# Patient Record
Sex: Female | Born: 1935 | Race: White | Hispanic: No | Marital: Married | Smoking: Former smoker
Health system: Southern US, Community
[De-identification: ages and names within clinical notes are randomized; demographics above are authoritative.]

## PROBLEM LIST (undated history)

## (undated) DIAGNOSIS — K219 Gastro-esophageal reflux disease without esophagitis: Secondary | ICD-10-CM

## (undated) DIAGNOSIS — M545 Low back pain, unspecified: Secondary | ICD-10-CM

## (undated) DIAGNOSIS — R112 Nausea with vomiting, unspecified: Secondary | ICD-10-CM

## (undated) DIAGNOSIS — M1990 Unspecified osteoarthritis, unspecified site: Secondary | ICD-10-CM

## (undated) DIAGNOSIS — I509 Heart failure, unspecified: Secondary | ICD-10-CM

## (undated) DIAGNOSIS — Z9889 Other specified postprocedural states: Secondary | ICD-10-CM

## (undated) DIAGNOSIS — I951 Orthostatic hypotension: Secondary | ICD-10-CM

## (undated) DIAGNOSIS — C50919 Malignant neoplasm of unspecified site of unspecified female breast: Secondary | ICD-10-CM

## (undated) DIAGNOSIS — I480 Paroxysmal atrial fibrillation: Secondary | ICD-10-CM

## (undated) DIAGNOSIS — I447 Left bundle-branch block, unspecified: Secondary | ICD-10-CM

## (undated) DIAGNOSIS — I472 Ventricular tachycardia: Secondary | ICD-10-CM

## (undated) DIAGNOSIS — I471 Supraventricular tachycardia: Secondary | ICD-10-CM

## (undated) DIAGNOSIS — I4719 Other supraventricular tachycardia: Secondary | ICD-10-CM

## (undated) DIAGNOSIS — R7302 Impaired glucose tolerance (oral): Secondary | ICD-10-CM

## (undated) DIAGNOSIS — G8929 Other chronic pain: Secondary | ICD-10-CM

## (undated) DIAGNOSIS — I10 Essential (primary) hypertension: Secondary | ICD-10-CM

## (undated) DIAGNOSIS — R55 Syncope and collapse: Secondary | ICD-10-CM

## (undated) DIAGNOSIS — I495 Sick sinus syndrome: Secondary | ICD-10-CM

## (undated) DIAGNOSIS — E119 Type 2 diabetes mellitus without complications: Secondary | ICD-10-CM

## (undated) DIAGNOSIS — S4991XA Unspecified injury of right shoulder and upper arm, initial encounter: Secondary | ICD-10-CM

## (undated) DIAGNOSIS — R000 Tachycardia, unspecified: Secondary | ICD-10-CM

## (undated) DIAGNOSIS — F419 Anxiety disorder, unspecified: Secondary | ICD-10-CM

## (undated) DIAGNOSIS — Z950 Presence of cardiac pacemaker: Secondary | ICD-10-CM

## (undated) DIAGNOSIS — N189 Chronic kidney disease, unspecified: Secondary | ICD-10-CM

## (undated) HISTORY — PX: REPLACEMENT TOTAL KNEE BILATERAL: SUR1225

## (undated) HISTORY — PX: TOTAL SHOULDER REPLACEMENT: SUR1217

## (undated) HISTORY — PX: ABDOMINAL HYSTERECTOMY: SHX81

## (undated) HISTORY — DX: Sick sinus syndrome: I49.5

## (undated) HISTORY — PX: INGUINAL HERNIA REPAIR: SUR1180

## (undated) HISTORY — PX: JOINT REPLACEMENT: SHX530

## (undated) HISTORY — DX: Malignant neoplasm of unspecified site of unspecified female breast: C50.919

## (undated) HISTORY — DX: Paroxysmal atrial fibrillation: I48.0

## (undated) HISTORY — DX: Left bundle-branch block, unspecified: I44.7

## (undated) HISTORY — DX: Tachycardia, unspecified: R00.0

## (undated) HISTORY — DX: Impaired glucose tolerance (oral): R73.02

## (undated) HISTORY — DX: Ventricular tachycardia: I47.2

## (undated) HISTORY — DX: Unspecified osteoarthritis, unspecified site: M19.90

## (undated) HISTORY — PX: PACEMAKER INSERTION: SHX728

## (undated) HISTORY — DX: Anxiety disorder, unspecified: F41.9

## (undated) HISTORY — PX: BREAST BIOPSY: SHX20

## (undated) HISTORY — DX: Syncope and collapse: R55

## (undated) HISTORY — PX: TONSILLECTOMY: SUR1361

---

## 1998-08-09 ENCOUNTER — Ambulatory Visit (HOSPITAL_COMMUNITY): Admission: RE | Admit: 1998-08-09 | Discharge: 1998-08-09 | Payer: Self-pay | Admitting: Internal Medicine

## 1998-08-09 ENCOUNTER — Encounter: Payer: Self-pay | Admitting: Internal Medicine

## 1999-02-06 ENCOUNTER — Ambulatory Visit (HOSPITAL_COMMUNITY): Admission: RE | Admit: 1999-02-06 | Discharge: 1999-02-06 | Payer: Self-pay | Admitting: Obstetrics & Gynecology

## 1999-07-06 HISTORY — PX: CATARACT EXTRACTION W/ INTRAOCULAR LENS  IMPLANT, BILATERAL: SHX1307

## 1999-12-07 ENCOUNTER — Encounter: Payer: Self-pay | Admitting: Internal Medicine

## 1999-12-07 ENCOUNTER — Encounter: Admission: RE | Admit: 1999-12-07 | Discharge: 1999-12-07 | Payer: Self-pay | Admitting: Internal Medicine

## 2000-12-22 ENCOUNTER — Encounter: Admission: RE | Admit: 2000-12-22 | Discharge: 2000-12-22 | Payer: Self-pay | Admitting: Internal Medicine

## 2000-12-22 ENCOUNTER — Encounter: Payer: Self-pay | Admitting: Internal Medicine

## 2001-11-04 DIAGNOSIS — C50919 Malignant neoplasm of unspecified site of unspecified female breast: Secondary | ICD-10-CM

## 2001-11-04 HISTORY — PX: MASTECTOMY: SHX3

## 2001-11-04 HISTORY — DX: Malignant neoplasm of unspecified site of unspecified female breast: C50.919

## 2002-01-11 ENCOUNTER — Encounter: Admission: RE | Admit: 2002-01-11 | Discharge: 2002-01-11 | Payer: Self-pay | Admitting: Internal Medicine

## 2002-01-11 ENCOUNTER — Encounter: Payer: Self-pay | Admitting: Internal Medicine

## 2003-01-06 ENCOUNTER — Encounter: Payer: Self-pay | Admitting: Internal Medicine

## 2003-01-06 ENCOUNTER — Encounter: Admission: RE | Admit: 2003-01-06 | Discharge: 2003-01-06 | Payer: Self-pay | Admitting: Internal Medicine

## 2004-01-12 ENCOUNTER — Encounter: Admission: RE | Admit: 2004-01-12 | Discharge: 2004-01-12 | Payer: Self-pay | Admitting: Internal Medicine

## 2004-07-11 ENCOUNTER — Encounter: Admission: RE | Admit: 2004-07-11 | Discharge: 2004-07-11 | Payer: Self-pay | Admitting: Specialist

## 2004-08-01 ENCOUNTER — Inpatient Hospital Stay (HOSPITAL_COMMUNITY): Admission: EM | Admit: 2004-08-01 | Discharge: 2004-08-03 | Payer: Self-pay

## 2005-01-22 ENCOUNTER — Encounter: Admission: RE | Admit: 2005-01-22 | Discharge: 2005-01-22 | Payer: Self-pay | Admitting: Internal Medicine

## 2005-02-05 ENCOUNTER — Ambulatory Visit (HOSPITAL_COMMUNITY): Admission: RE | Admit: 2005-02-05 | Discharge: 2005-02-05 | Payer: Self-pay | Admitting: Internal Medicine

## 2005-10-17 ENCOUNTER — Inpatient Hospital Stay (HOSPITAL_COMMUNITY): Admission: EM | Admit: 2005-10-17 | Discharge: 2005-10-23 | Payer: Self-pay | Admitting: Emergency Medicine

## 2005-10-23 ENCOUNTER — Ambulatory Visit: Payer: Self-pay | Admitting: Internal Medicine

## 2005-11-06 ENCOUNTER — Ambulatory Visit: Payer: Self-pay

## 2006-01-23 ENCOUNTER — Encounter: Admission: RE | Admit: 2006-01-23 | Discharge: 2006-01-23 | Payer: Self-pay | Admitting: Internal Medicine

## 2006-01-28 ENCOUNTER — Ambulatory Visit: Payer: Self-pay | Admitting: Internal Medicine

## 2006-04-03 ENCOUNTER — Ambulatory Visit (HOSPITAL_COMMUNITY): Admission: RE | Admit: 2006-04-03 | Discharge: 2006-04-03 | Payer: Self-pay | Admitting: Internal Medicine

## 2006-04-06 ENCOUNTER — Emergency Department (HOSPITAL_COMMUNITY): Admission: EM | Admit: 2006-04-06 | Discharge: 2006-04-06 | Payer: Self-pay | Admitting: Emergency Medicine

## 2006-10-09 ENCOUNTER — Ambulatory Visit: Payer: Self-pay | Admitting: Internal Medicine

## 2006-11-17 ENCOUNTER — Ambulatory Visit: Payer: Self-pay | Admitting: Internal Medicine

## 2007-01-12 ENCOUNTER — Ambulatory Visit: Payer: Self-pay | Admitting: Internal Medicine

## 2007-02-11 ENCOUNTER — Encounter: Admission: RE | Admit: 2007-02-11 | Discharge: 2007-02-11 | Payer: Self-pay | Admitting: Internal Medicine

## 2007-03-09 ENCOUNTER — Ambulatory Visit: Payer: Self-pay | Admitting: Internal Medicine

## 2007-04-21 ENCOUNTER — Encounter: Admission: RE | Admit: 2007-04-21 | Discharge: 2007-05-20 | Payer: Self-pay | Admitting: Internal Medicine

## 2007-05-04 ENCOUNTER — Ambulatory Visit: Payer: Self-pay | Admitting: Internal Medicine

## 2007-06-29 ENCOUNTER — Ambulatory Visit: Payer: Self-pay | Admitting: Internal Medicine

## 2007-08-24 ENCOUNTER — Ambulatory Visit: Payer: Self-pay | Admitting: Internal Medicine

## 2007-08-31 ENCOUNTER — Ambulatory Visit: Payer: Self-pay

## 2007-10-09 ENCOUNTER — Ambulatory Visit: Payer: Self-pay | Admitting: Internal Medicine

## 2007-10-19 ENCOUNTER — Ambulatory Visit: Payer: Self-pay | Admitting: Internal Medicine

## 2007-11-19 ENCOUNTER — Ambulatory Visit: Payer: Self-pay | Admitting: Internal Medicine

## 2007-12-06 HISTORY — PX: ORIF DISTAL FEMUR FRACTURE: SUR926

## 2007-12-22 ENCOUNTER — Encounter: Payer: Self-pay | Admitting: Internal Medicine

## 2007-12-23 ENCOUNTER — Encounter (INDEPENDENT_AMBULATORY_CARE_PROVIDER_SITE_OTHER): Payer: Self-pay | Admitting: Internal Medicine

## 2007-12-23 ENCOUNTER — Inpatient Hospital Stay (HOSPITAL_COMMUNITY): Admission: EM | Admit: 2007-12-23 | Discharge: 2007-12-29 | Payer: Self-pay | Admitting: Emergency Medicine

## 2007-12-23 ENCOUNTER — Ambulatory Visit: Payer: Self-pay | Admitting: Surgery

## 2007-12-23 ENCOUNTER — Encounter: Payer: Self-pay | Admitting: Internal Medicine

## 2007-12-23 ENCOUNTER — Ambulatory Visit: Payer: Self-pay | Admitting: Internal Medicine

## 2008-01-05 ENCOUNTER — Inpatient Hospital Stay (HOSPITAL_COMMUNITY): Admission: EM | Admit: 2008-01-05 | Discharge: 2008-01-21 | Payer: Self-pay | Admitting: Emergency Medicine

## 2008-01-12 ENCOUNTER — Ambulatory Visit: Payer: Self-pay | Admitting: Internal Medicine

## 2008-01-19 ENCOUNTER — Encounter (INDEPENDENT_AMBULATORY_CARE_PROVIDER_SITE_OTHER): Payer: Self-pay | Admitting: *Deleted

## 2008-03-18 ENCOUNTER — Inpatient Hospital Stay (HOSPITAL_COMMUNITY): Admission: AD | Admit: 2008-03-18 | Discharge: 2008-03-20 | Payer: Self-pay | Admitting: Cardiology

## 2008-04-12 ENCOUNTER — Ambulatory Visit: Payer: Self-pay | Admitting: Internal Medicine

## 2008-05-19 ENCOUNTER — Ambulatory Visit: Payer: Self-pay | Admitting: Internal Medicine

## 2008-08-18 ENCOUNTER — Ambulatory Visit: Payer: Self-pay | Admitting: Internal Medicine

## 2008-08-25 ENCOUNTER — Ambulatory Visit (HOSPITAL_COMMUNITY): Admission: RE | Admit: 2008-08-25 | Discharge: 2008-08-25 | Payer: Self-pay | Admitting: Orthopedic Surgery

## 2008-11-17 ENCOUNTER — Ambulatory Visit: Payer: Self-pay | Admitting: Internal Medicine

## 2008-11-29 ENCOUNTER — Ambulatory Visit: Payer: Self-pay | Admitting: Internal Medicine

## 2008-11-29 DIAGNOSIS — R55 Syncope and collapse: Secondary | ICD-10-CM

## 2008-11-29 DIAGNOSIS — Z950 Presence of cardiac pacemaker: Secondary | ICD-10-CM

## 2008-11-30 ENCOUNTER — Encounter: Payer: Self-pay | Admitting: Internal Medicine

## 2009-02-16 ENCOUNTER — Ambulatory Visit: Payer: Self-pay | Admitting: Internal Medicine

## 2009-05-11 ENCOUNTER — Ambulatory Visit: Payer: Self-pay

## 2009-05-11 ENCOUNTER — Encounter: Payer: Self-pay | Admitting: Internal Medicine

## 2009-05-16 ENCOUNTER — Ambulatory Visit: Payer: Self-pay | Admitting: Internal Medicine

## 2009-05-29 ENCOUNTER — Ambulatory Visit: Payer: Self-pay

## 2009-08-03 ENCOUNTER — Encounter: Payer: Self-pay | Admitting: Internal Medicine

## 2009-11-02 ENCOUNTER — Encounter: Payer: Self-pay | Admitting: Internal Medicine

## 2009-12-05 ENCOUNTER — Ambulatory Visit: Payer: Self-pay | Admitting: Internal Medicine

## 2009-12-19 ENCOUNTER — Ambulatory Visit: Payer: Self-pay | Admitting: Internal Medicine

## 2009-12-19 ENCOUNTER — Inpatient Hospital Stay (HOSPITAL_COMMUNITY): Admission: EM | Admit: 2009-12-19 | Discharge: 2009-12-23 | Payer: Self-pay | Admitting: Emergency Medicine

## 2009-12-21 ENCOUNTER — Encounter (INDEPENDENT_AMBULATORY_CARE_PROVIDER_SITE_OTHER): Payer: Self-pay | Admitting: Cardiology

## 2009-12-29 ENCOUNTER — Inpatient Hospital Stay (HOSPITAL_COMMUNITY): Admission: EM | Admit: 2009-12-29 | Discharge: 2010-01-02 | Payer: Self-pay | Admitting: Emergency Medicine

## 2010-06-06 ENCOUNTER — Encounter: Payer: Self-pay | Admitting: Internal Medicine

## 2010-06-06 ENCOUNTER — Ambulatory Visit: Payer: Self-pay

## 2010-08-02 ENCOUNTER — Ambulatory Visit: Payer: Self-pay | Admitting: Internal Medicine

## 2010-08-31 ENCOUNTER — Encounter (INDEPENDENT_AMBULATORY_CARE_PROVIDER_SITE_OTHER): Payer: Self-pay | Admitting: *Deleted

## 2010-11-01 ENCOUNTER — Encounter: Payer: Self-pay | Admitting: Internal Medicine

## 2010-11-07 ENCOUNTER — Ambulatory Visit: Payer: Self-pay | Admitting: Internal Medicine

## 2010-12-04 NOTE — Cardiovascular Report (Signed)
Summary: Office Visit   Office Visit   Imported By: Roderic Ovens 06/20/2010 11:39:35  _____________________________________________________________________  External Attachment:    Type:   Image     Comment:   External Document

## 2010-12-04 NOTE — Procedures (Signed)
Summary: DEVICE/SAF   Current Medications (verified): 1)  Protonix 40 Mg Tbec (Pantoprazole Sodium) .... One By Mouth Daily 2)  Metoprolol Tartrate 50 Mg Tabs (Metoprolol Tartrate) .... Take 2 Tablets By Mouth Once Daily 3)  Alprazolam 0.25 Mg Tabs (Alprazolam) .... One By Mouth Two Times A Day 4)  Aspir-Low 81 Mg Tbec (Aspirin) .... One By Mouth Daily 5)  Tylenol 325 Mg Tabs (Acetaminophen) .... As Needed 6)  Fish Oil Maximum Strength 1200 Mg Caps (Omega-3 Fatty Acids) .... One By Mouth Daily 7)  Vitamin D 1000 Unit Tabs (Cholecalciferol) .... Take 1 Tablet By Mouth Once Daily 8)  Flecainide Acetate 50 Mg Tabs (Flecainide Acetate) .... Take 2 Tablets By Mouth Once Daily 9)  Spironolactone 25 Mg Tabs (Spironolactone) .... Take 1/2 Tablet By Mouth Once Daily 10)  Ramipril 2.5 Mg Caps (Ramipril) .... Take 1 Tablet By Mouth Once Daily 11)  Pantoprazole Sodium 40 Mg Tbec (Pantoprazole Sodium) .... Take 1 Capsule By Mouth Once Daily 12)  Warfarin Sodium 3 Mg Tabs (Warfarin Sodium) .... As Directed By Coumadin Clinic  Allergies (verified): No Known Drug Allergies  PPM Specifications Following MD:  Sherryl Manges, MD     Referring MD:  [facility] Vendor:  [facility] [employer]     PPM Model Number:  [phone]     PPM Serial Number:  [id number] DOI:  10/22/2005     PPM Implanting MD:  Sherryl Manges, MD  Lead 1    Location: RA     DOI: 10/22/2005     Model #: [id number]     Serial #: [id number]     Status: active Lead 2    Location: RV     DOI: 10/22/2005     Model #: [id number]     Serial #: [id number]     Status: active  Magnet Response Rate:  BOL 100 ERI 85  Indications:  SINUS NODE DYSFUNCTION, AFIB   PPM Follow Up Battery Voltage:  GOOD V     Battery Est. Longevity:  >5 YRS     Pacer Dependent:  No       PPM Device Measurements Atrium  Amplitude: >0.75 mV, Impedance: 480 ohms, Threshold: 0.8 V at 0.40 msec Right Ventricle  Amplitude: 12.0 mV, Impedance: 1090 ohms, Threshold: 2.1 V at 0.40  msec  Episodes MS Episodes:  0     Coumadin:  No Ventricular High Rate:  0     Atrial Pacing:  61%     Ventricular Pacing:  82%  Parameters Mode:  DDDR     Lower Rate Limit:  60     Upper Rate Limit:  110 Paced AV Delay:  300     Sensed AV Delay:  240 Next Cardiology Appt Due:  08/27/2010 Tech Comments:  LEAD IMPEDANCES INCREASED TO 1090 (LAST CHECK 520).  RV THRESHOLD 2.1 @ 0.54ms. LAST CHECK 1.0 @ 0.32ms.   PT VP 82%.  PT  HAVING SOB X 3 MTHS.  LAST MONTH WAS STARTED ON COUMADIN.  PT HAS NO ENERGY OR BREATH TO DO MUCH.  PT FEELS LIKE SHE MIGHT BE TAKING TO MUCH OF MEDS.  PT TO CALL PRIMARY CARD. PT TAKES FLECAINIDE 50 two times a day. ROV 08-27-10 @ 1315. Vella Kohler  June 07, 2010 10:15 AM

## 2010-12-04 NOTE — Cardiovascular Report (Signed)
Summary: TTM   TTM   Imported By: Roderic Ovens 11/16/2009 10:50:23  _____________________________________________________________________  External Attachment:    Type:   Image     Comment:   External Document

## 2010-12-04 NOTE — Miscellaneous (Signed)
Summary: dx correction  Clinical Lists Changes  Problems: Changed problem from PACEMAKER (ICD-V45..01) to PACEMAKER, PERMANENT (ICD-V45.01)  changed the incorrect dx code to correct dx code 

## 2010-12-04 NOTE — Cardiovascular Report (Signed)
Summary: Office Visit  Office Visit   Imported By: Marylou Mccoy 12/25/2009 15:01:16  _____________________________________________________________________  External Attachment:    Type:   Image     Comment:   External Document

## 2010-12-04 NOTE — Assessment & Plan Note (Signed)
Summary: f1y/   CC:  1 year follow up.  Pt has no cardiac concerns at this time.[name]  History of Present Illness:  Sophia Oliver is seen in followup for sinus node dysfunction and paroxysmal atrial fibrillation. She is status post pacemaker implantation.  sHe has had no recurrent syncope but does have some periodic palpitations occurring about once a month. She continues to have dyspnea on exertion although this was markedly improved by Dr. Sharyn Lull is changing her beta blocker from once a day to twice a day  Current Medications (verified): 1)  Protonix 40 Mg Tbec (Pantoprazole Sodium) .... One By Mouth Daily 2)  Metoprolol Tartrate 50 Mg Tabs (Metoprolol Tartrate) .[name].. 1 1/2 By Mouth Two Times A Day 3)  Alprazolam 0.25 Mg Tabs (Alprazolam) .... One By Mouth Two Times A Day 4)  Aspir-Low 81 Mg Tbec (Aspirin) .... One By Mouth Daily 5)  Tylenol 325 Mg Tabs (Acetaminophen) .... As Needed 6)  Fish Oil Maximum Strength 1200 Mg Caps (Omega-3 Fatty Acids) .... One By Mouth Daily  Allergies (verified): No Known Drug Allergies  Past History:  Past Medical History: Last updated: 11/29/2008 syncope Sinus node dysfunction Status post pacemaker-Guidant Wide-complex tachycardia Breast cancer Cholelithiasis Glucose intolerance DJD Anxiety  Past Surgical History: Last updated: 11/29/2008 status post pacemaker bilateral total knee replacements total shoulder replacement hip replacement femur fracture repair hysterectomy Mastectomy  Social History: Last updated: 11/29/2008 Married  Tobacco Use - No.  Alcohol Use - no Retired   Vital Signs:  Patient profile:   77 year old female Height:      64 inches Weight:      139 pounds BMI:     23.95 Pulse rate:   60 / minute Pulse rhythm:   regular BP sitting:   156 / 80  (right arm) Cuff size:   regular  Vitals Entered By: Judithe Modest CMA (December 05, 2009 11:12 AM)  Physical Exam  General:  The patient was alert and oriented in  no acute distress. HEENT Normal.  Neck veins were flat, carotids were brisk.  Lungs were clear.  Heart sounds were regular without murmurs or gallops.  Abdomen was soft with active bowel sounds. There is no clubbing cyanosis or edema. Skin Warm and dry    PPM Specifications Following MD:  Sherryl Manges, MD     Referring MD:  [facility] Vendor:  [employer]     PPM Model Number:  [id number]     PPM Serial Number:  [id number] PPM DOI:  10/22/2005     PPM Implanting MD:  Sherryl Manges, MD  Lead 1    Location: RA     DOI: 10/22/2005     Model #: [id number]     Serial #: [id number]     Status: active Lead 2    Location: RV     DOI: 10/22/2005     Model #: [id number]     Serial #: [id number]     Status: active  Magnet Response Rate:  BOL 100 ERI 85  Indications:  SINUS NODE DYSFUNCTION, AFIB   PPM Follow Up Remote Check?  No Battery Voltage:  good V     Battery Est. Longevity:  > 5 years     Pacer Dependent:  No       PPM Device Measurements Atrium  Amplitude: >0.75 mV, Impedance: 440 ohms, Threshold: 0.5 V at 0.4 msec Right Ventricle  Amplitude: >12.0 mV, Impedance: 520 ohms, Threshold: 1.0 V at 0.8 msec  Episodes MS Episodes:  2     Percent Mode Switch:  0%     Coumadin:  No Ventricular High Rate:  0     Atrial Pacing:  73%     Ventricular Pacing:  42%  Parameters Mode:  DDDR     Lower Rate Limit:  60     Upper Rate Limit:  110 Paced AV Delay:  300     Sensed AV Delay:  240 Next Cardiology Appt Due:  06/04/2010 Tech Comments:  RV reprogrammed 2.0@0 .8.  There were 2 mode switch episodes noted both lasting 10 minutes.  Sophia Oliver is not on coumadin.  She will continue with her TTM's through [facility] and return to clinic iin 6 months.  Altha Harm, LPN  December 05, 2009 11:46 AM   Impression & Recommendations:  Problem # 1:  PACEMAKER (ICD-V45.[name]01) Device parameters and data were reviewed.  her heart rate excursion is quite interesting in that there is a second complaint about 100 110 beats per  minute. These are all sensed events. The mechanism of this is not clear. She has some atrial fibrillation episodes at a much higher heart rate. We will try and reprogram the actions to see if we can elucidate this.    Problem # 2:  TACHYCARDIA-LBBB PROB SVT (ICD-785) We were able to obtain the ECGs from February 2009. These demonstrate a left bundle branch block wide complex tachycardia probably supraventricular in origin. I do not see clear P waves despite a change in rate from 158-137.  I. reprogramming the pacemaker as above will help [place] understand this  Problem # 3:  PACEMAKER (ICD-V45.[name]01) Device parameters and data were reviewed . device was reprogrammed to try and identiffy the tachycardiua  Other Orders: EKG w/ Interpretation (93000)

## 2010-12-04 NOTE — Cardiovascular Report (Signed)
Summary: TTM   TTM   Imported By: Roderic Ovens 08/31/2010 15:18:28  _____________________________________________________________________  External Attachment:    Type:   Image     Comment:   External Document

## 2010-12-06 NOTE — Cardiovascular Report (Signed)
Summary: TTM   TTM   Imported By: Roderic Ovens 11/28/2010 13:35:39  _____________________________________________________________________  External Attachment:    Type:   Image     Comment:   External Document

## 2010-12-25 IMAGING — CR DG ABDOMEN 1V
1 series · 1 of 1 positions shown · non-contrast
Comparison: 01/09/2008.

CLINICAL DATA: Nausea.

ABDOMEN - 1 VIEW

[t abdomen supine]
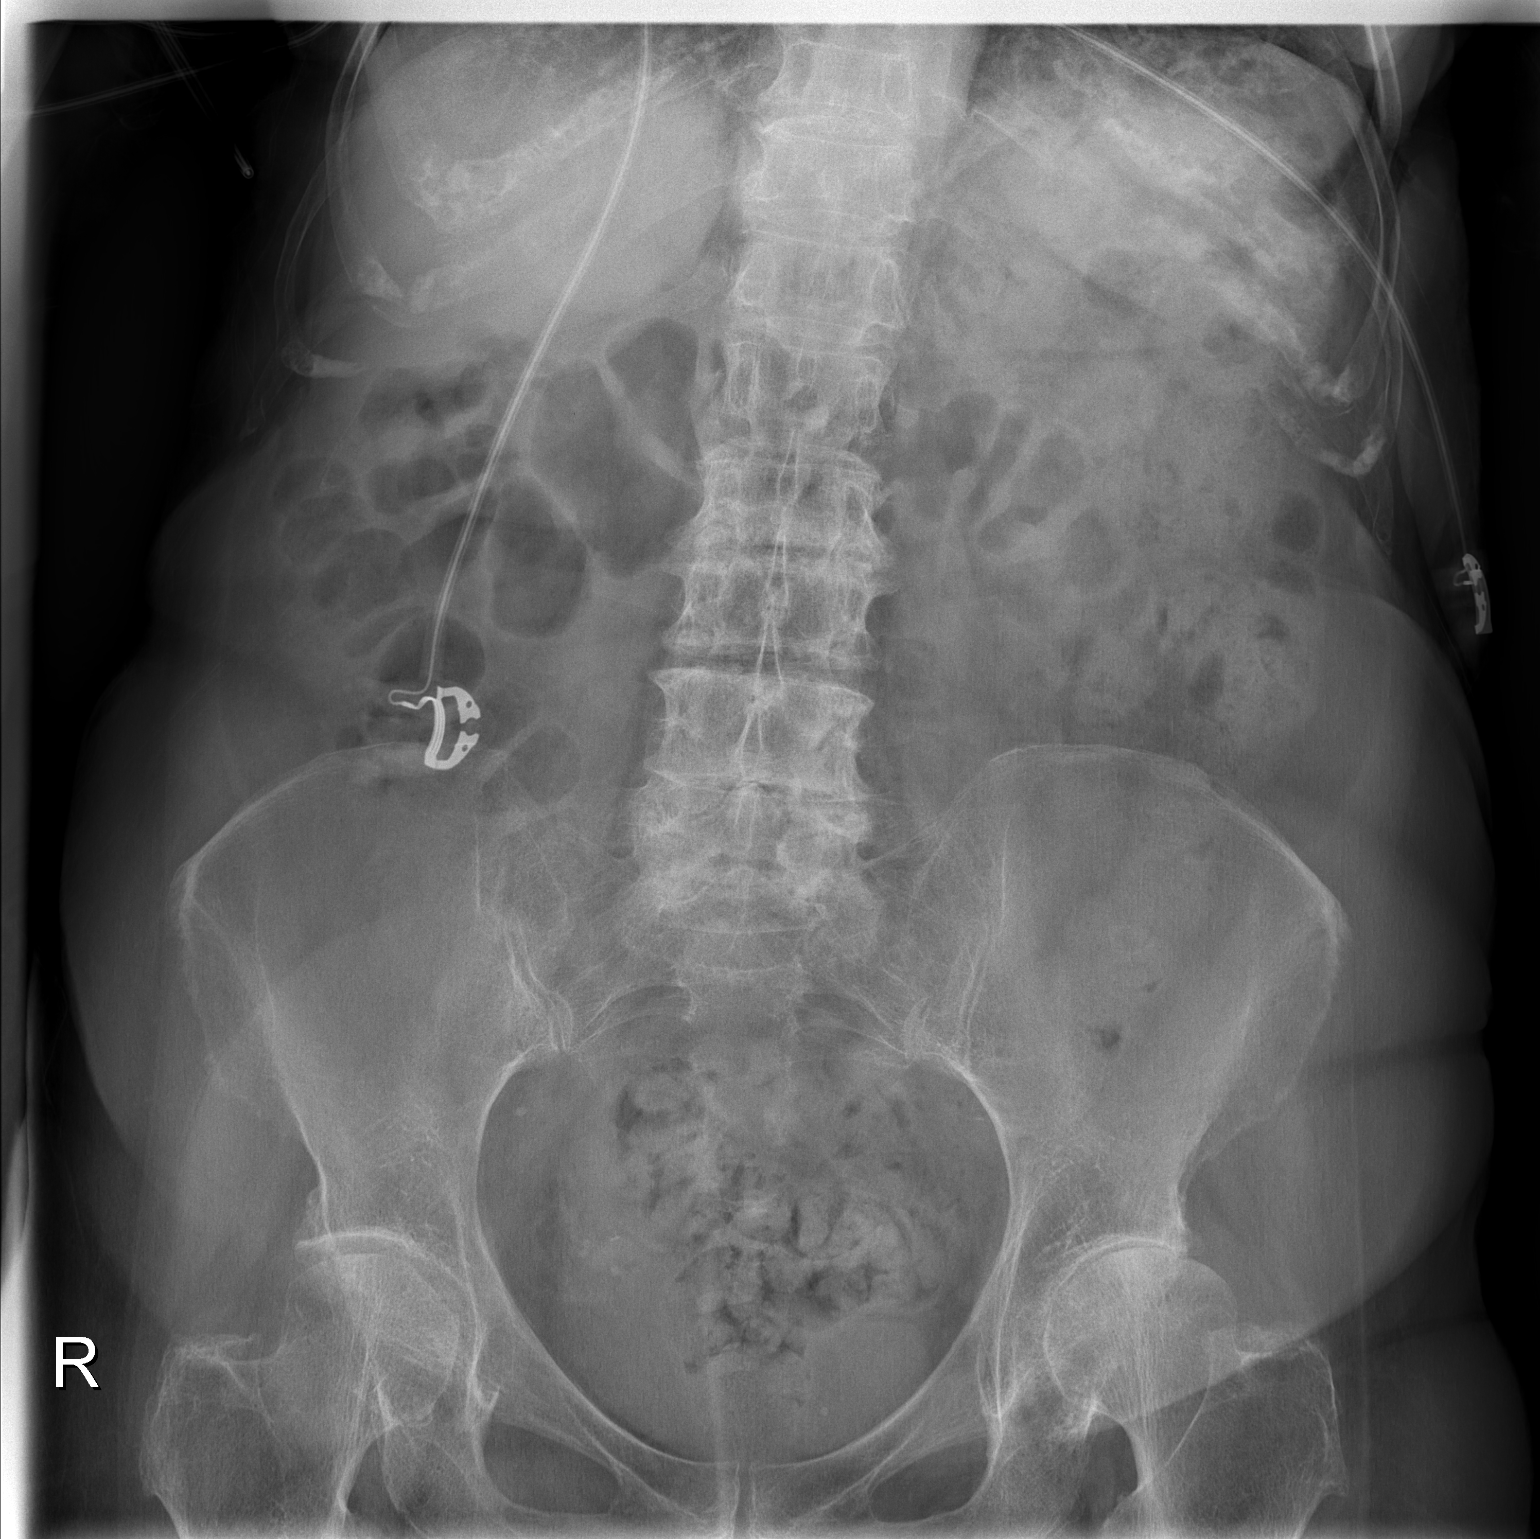

[1 of 1 positions shown; findings below may reference images not displayed]

FINDINGS: Nonspecific bowel gas pattern without evidence of
obstruction.  Moderate stool rectosigmoid region. The possibility
of free intraperitoneal air cannot be addressed on a supine view.
Degenerative changes lumbar spine and left hip joint.
IMPRESSION: No evidence of obstruction.  Please see above.

## 2011-01-08 ENCOUNTER — Encounter: Payer: Self-pay | Admitting: Internal Medicine

## 2011-01-08 ENCOUNTER — Encounter (INDEPENDENT_AMBULATORY_CARE_PROVIDER_SITE_OTHER): Payer: Medicare Other | Admitting: Internal Medicine

## 2011-01-08 DIAGNOSIS — I459 Conduction disorder, unspecified: Secondary | ICD-10-CM

## 2011-01-08 DIAGNOSIS — I4891 Unspecified atrial fibrillation: Secondary | ICD-10-CM | POA: Insufficient documentation

## 2011-01-08 DIAGNOSIS — I495 Sick sinus syndrome: Secondary | ICD-10-CM

## 2011-01-08 DIAGNOSIS — Z950 Presence of cardiac pacemaker: Secondary | ICD-10-CM

## 2011-01-15 NOTE — Assessment & Plan Note (Signed)
Summary: PC2   History of Present Illness:  Sophia Oliver is seen in followup for sinus node dysfunction and paroxysmal atrial fibrillation. She is status post pacemaker implantation.  sHe has had no recurrent syncope but does have some periodic palpitations occurring about once a month. She continues to have dyspnea on exertion although this was markedly improved by Dr. Sharyn Lull is changing her beta blocker from once a day to twice a day  Current Medications (verified): 1)  Metoprolol Tartrate 50 Mg Tabs (Metoprolol Tartrate) .... Take 2 Tablets By Mouth Once Daily 2)  Alprazolam 0.25 Mg Tabs (Alprazolam) .... One By Mouth Two Times A Day 3)  Tylenol 325 Mg Tabs (Acetaminophen) .... As Needed 4)  Fish Oil Maximum Strength 1200 Mg Caps (Omega-3 Fatty Acids) .... One By Mouth Daily 5)  Vitamin D 1000 Unit Tabs (Cholecalciferol) .... Take 1 Tablet By Mouth Once Daily 6)  Pantoprazole Sodium 40 Mg Tbec (Pantoprazole Sodium) .... Take 1 Capsule By Mouth Once Daily 7)  Warfarin Sodium 2 Mg Tabs (Warfarin Sodium) .... Use As Directed By Anticoagualtion Clinic  Allergies (verified): 1)  ! Codeine  Past History:  Past Medical History: Last updated: 11/29/2008 syncope Sinus node dysfunction Status post pacemaker-Guidant Wide-complex tachycardia Breast cancer Cholelithiasis Glucose intolerance DJD Anxiety  Vital Signs:  Patient profile:   77 year old female Height:      64 inches Weight:      137 pounds BMI:     23.60 BP sitting:   138 / 60  (left arm)  Vitals Entered By: Laurance Flatten CMA (January 08, 2011 3:12 PM)  Physical Exam  General:  The patient was alert and oriented in no acute distress. HEENT Normal.  Neck veins were flat, carotids were brisk.  Lungs were clear.  Heart sounds were regular without murmurs or gallops.  Abdomen was soft with active bowel sounds. There is no clubbing cyanosis or edema. Skin Warm and dry \\par   PPM Specifications Following MD:  Sherryl Manges,  MD     Referring MD:  [facility] Vendor:  [facility] [employer]     PPM Model Number:  [phone]     PPM Serial Number:  [id number] PPM DOI:  10/22/2005     PPM Implanting MD:  Sherryl Manges, MD  Lead 1    Location: RA     DOI: 10/22/2005     Model #: [id number]     Serial #: [id number]     Status: active Lead 2    Location: RV     DOI: 10/22/2005     Model #: 0932     Serial #: [id number]     Status: active  Magnet Response Rate:  BOL 100 ERI 85  Indications:  SINUS NODE DYSFUNCTION, AFIB   PPM Follow Up Pacer Dependent:  No      Episodes Coumadin:  No  Parameters Mode:  DDDR     Lower Rate Limit:  60     Upper Rate Limit:  110 Paced AV Delay:  300     Sensed AV Delay:  240  Impression & Recommendations:  Problem # 1:  SINUS NODE DYSFUNCTION (ICD-427.81)  stable  on current meds The following medications were removed from the medication list:    Aspir-low 81 Mg Tbec (Aspirin) ..... One by mouth daily    Flecainide Acetate 50 Mg Tabs (Flecainide acetate) .[name]... Take 2 tablets by mouth once daily    Ramipril 2.5 Mg Caps (Ramipril) .[name]... Take 1  tablet by mouth once daily Her updated medication list for this problem includes:    Metoprolol Tartrate 50 Mg Tabs (Metoprolol tartrate) .[name]... Take 2 tablets by mouth once daily    Warfarin Sodium 2 Mg Tabs (Warfarin sodium) ..... Use as directed by anticoagualtion clinic  The following medications were removed from the medication list:    Aspir-low 81 Mg Tbec (Aspirin) ..... One by mouth daily    Flecainide Acetate 50 Mg Tabs (Flecainide acetate) .[name]... Take 2 tablets by mouth once daily    Ramipril 2.5 Mg Caps (Ramipril) .[name]... Take 1 tablet by mouth once daily Her updated medication list for this problem includes:    Metoprolol Tartrate 50 Mg Tabs (Metoprolol tartrate) .[name]... Take 2 tablets by mouth once daily    Warfarin Sodium 2 Mg Tabs (Warfarin sodium) ..... Use as directed by anticoagualtion clinic  Problem # 2:  PACEMAKER, PERMANENT  (ICD-V45.01) Device parameters and data were reviewed and no changes were made  Problem # 3:  RIGHT VENTRICULAR LEAD-INCREASING IMPEDANCE AND THRESHOLD (ICD-996.01) in concerned about this. He has been gradually but persistently increasing over the last number of months. We will plan to continue to follow it. She is not device dependent.  Problem # 4:  ATRIAL FIBRILLATION (ICD-427.31)  no significant intercurrent atrial fibrillation as detected by her device The following medications were removed from the medication list:    Aspir-low 81 Mg Tbec (Aspirin) ..... One by mouth daily    Flecainide Acetate 50 Mg Tabs (Flecainide acetate) .[name]... Take 2 tablets by mouth once daily Her updated medication list for this problem includes:    Metoprolol Tartrate 50 Mg Tabs (Metoprolol tartrate) .[name]... Take 2 tablets by mouth once daily    Warfarin Sodium 2 Mg Tabs (Warfarin sodium) ..... Use as directed by anticoagualtion clinic  The following medications were removed from the medication list:    Aspir-low 81 Mg Tbec (Aspirin) ..... One by mouth daily    Flecainide Acetate 50 Mg Tabs (Flecainide acetate) .[name]... Take 2 tablets by mouth once daily Her updated medication list for this problem includes:    Metoprolol Tartrate 50 Mg Tabs (Metoprolol tartrate) .[name]... Take 2 tablets by mouth once daily    Warfarin Sodium 2 Mg Tabs (Warfarin sodium) ..... Use as directed by anticoagualtion clinic  Patient Instructions: 1)  Your physician recommends that you schedule a follow-up appointment in: 3 MONTHS WITH DR Graciela Husbands 2)  Your physician recommends that you continue on your current medications as directed. Please refer to the Current Medication list given to you today.

## 2011-01-22 NOTE — Cardiovascular Report (Signed)
Summary: Office Visit   Office Visit   Imported By: Roderic Ovens 01/14/2011 15:05:27  _____________________________________________________________________  External Attachment:    Type:   Image     Comment:   External Document

## 2011-01-23 LAB — COMPREHENSIVE METABOLIC PANEL
ALT: 19 U/L (ref 0–35)
AST: 24 U/L (ref 0–37)
Albumin: 3.7 g/dL (ref 3.5–5.2)
Alkaline Phosphatase: 107 U/L (ref 39–117)
BUN: 35 mg/dL — ABNORMAL HIGH (ref 6–23)
CO2: 25 mEq/L (ref 19–32)
Calcium: 9.8 mg/dL (ref 8.4–10.5)
Chloride: 108 mEq/L (ref 96–112)
Chloride: 99 mEq/L (ref 96–112)
Creatinine, Ser: 1.43 mg/dL — ABNORMAL HIGH (ref 0.4–1.2)
GFR calc Af Amer: 39 mL/min — ABNORMAL LOW (ref >60)
GFR calc non Af Amer: 32 mL/min — ABNORMAL LOW (ref >60)
Glucose, Bld: 154 mg/dL — ABNORMAL HIGH (ref 70–99)
Potassium: 4.9 mEq/L (ref 3.5–5.1)
Sodium: 134 mEq/L — ABNORMAL LOW (ref 135–145)
Total Bilirubin: 1.1 mg/dL (ref 0.3–1.2)
Total Bilirubin: 1.4 mg/dL — ABNORMAL HIGH (ref 0.3–1.2)

## 2011-01-23 LAB — URINE MICROSCOPIC-ADD ON

## 2011-01-23 LAB — URINALYSIS, ROUTINE W REFLEX MICROSCOPIC
Bilirubin Urine: NEGATIVE
Glucose, UA: NEGATIVE mg/dL
Ketones, ur: 15 mg/dL — AB
Protein, ur: NEGATIVE mg/dL

## 2011-01-23 LAB — DIFFERENTIAL
Basophils Absolute: 0 10*3/uL (ref 0.0–0.1)
Basophils Absolute: 0.1 10*3/uL (ref 0.0–0.1)
Basophils Relative: 1 % (ref 0–1)
Eosinophils Absolute: 0.1 10*3/uL (ref 0.0–0.7)
Eosinophils Relative: 1 % (ref 0–5)
Lymphocytes Relative: 14 % (ref 12–46)
Lymphs Abs: 1.2 10*3/uL (ref 0.7–4.0)
Monocytes Absolute: 0.4 10*3/uL (ref 0.1–1.0)
Neutro Abs: 6.5 10*3/uL (ref 1.7–7.7)
Neutrophils Relative %: 76 % (ref 43–77)
Neutrophils Relative %: 79 % — ABNORMAL HIGH (ref 43–77)

## 2011-01-23 LAB — BASIC METABOLIC PANEL
BUN: 17 mg/dL (ref 6–23)
BUN: 20 mg/dL (ref 6–23)
BUN: 24 mg/dL — ABNORMAL HIGH (ref 6–23)
BUN: 30 mg/dL — ABNORMAL HIGH (ref 6–23)
BUN: 31 mg/dL — ABNORMAL HIGH (ref 6–23)
BUN: 31 mg/dL — ABNORMAL HIGH (ref 6–23)
CO2: 22 mEq/L (ref 19–32)
CO2: 27 mEq/L (ref 19–32)
CO2: 27 mEq/L (ref 19–32)
CO2: 28 mEq/L (ref 19–32)
Calcium: 8.9 mg/dL (ref 8.4–10.5)
Calcium: 9.2 mg/dL (ref 8.4–10.5)
Calcium: 9.3 mg/dL (ref 8.4–10.5)
Calcium: 9.5 mg/dL (ref 8.4–10.5)
Chloride: 103 mEq/L (ref 96–112)
Chloride: 109 mEq/L (ref 96–112)
Creatinine, Ser: 1.37 mg/dL — ABNORMAL HIGH (ref 0.4–1.2)
Creatinine, Ser: 1.39 mg/dL — ABNORMAL HIGH (ref 0.4–1.2)
Creatinine, Ser: 1.44 mg/dL — ABNORMAL HIGH (ref 0.4–1.2)
Creatinine, Ser: 1.46 mg/dL — ABNORMAL HIGH (ref 0.4–1.2)
Creatinine, Ser: 1.5 mg/dL — ABNORMAL HIGH (ref 0.4–1.2)
GFR calc Af Amer: 41 mL/min — ABNORMAL LOW (ref >60)
GFR calc Af Amer: 42 mL/min — ABNORMAL LOW (ref >60)
GFR calc Af Amer: 45 mL/min — ABNORMAL LOW (ref >60)
GFR calc non Af Amer: 33 mL/min — ABNORMAL LOW (ref >60)
GFR calc non Af Amer: 34 mL/min — ABNORMAL LOW (ref >60)
GFR calc non Af Amer: 35 mL/min — ABNORMAL LOW (ref >60)
Glucose, Bld: 103 mg/dL — ABNORMAL HIGH (ref 70–99)
Glucose, Bld: 141 mg/dL — ABNORMAL HIGH (ref 70–99)
Glucose, Bld: 96 mg/dL (ref 70–99)
Glucose, Bld: 98 mg/dL (ref 70–99)
Potassium: 4.4 mEq/L (ref 3.5–5.1)
Sodium: 137 mEq/L (ref 135–145)
Sodium: 138 mEq/L (ref 135–145)

## 2011-01-23 LAB — BRAIN NATRIURETIC PEPTIDE
Pro B Natriuretic peptide (BNP): 229 pg/mL — ABNORMAL HIGH (ref 0.0–100.0)
Pro B Natriuretic peptide (BNP): 241 pg/mL — ABNORMAL HIGH (ref 0.0–100.0)

## 2011-01-23 LAB — CBC
HCT: 36.2 % (ref 36.0–46.0)
Hemoglobin: 12.3 g/dL (ref 12.0–15.0)
Hemoglobin: 15.4 g/dL — ABNORMAL HIGH (ref 12.0–15.0)
MCHC: 34 g/dL (ref 30.0–36.0)
MCHC: 34 g/dL (ref 30.0–36.0)
MCHC: 34.4 g/dL (ref 30.0–36.0)
MCV: 99 fL (ref 78.0–100.0)
MCV: 99.3 fL (ref 78.0–100.0)
Platelets: 218 10*3/uL (ref 150–400)
Platelets: 227 10*3/uL (ref 150–400)
Platelets: 267 10*3/uL (ref 150–400)
RBC: 3.65 MIL/uL — ABNORMAL LOW (ref 3.87–5.11)
RBC: 4.19 MIL/uL (ref 3.87–5.11)
RBC: 4.54 MIL/uL (ref 3.87–5.11)
RDW: 15.2 % (ref 11.5–15.5)
RDW: 15.2 % (ref 11.5–15.5)
RDW: 15.4 % (ref 11.5–15.5)
WBC: 8.1 10*3/uL (ref 4.0–10.5)
WBC: 8.7 10*3/uL (ref 4.0–10.5)
WBC: 9.1 10*3/uL (ref 4.0–10.5)
WBC: 9.5 10*3/uL (ref 4.0–10.5)

## 2011-01-23 LAB — CARDIAC PANEL(CRET KIN+CKTOT+MB+TROPI)
CK, MB: 2.7 ng/mL (ref 0.3–4.0)
Relative Index: INVALID (ref 0.0–2.5)
Total CK: 70 U/L (ref 7–177)
Troponin I: 0.03 ng/mL (ref 0.00–0.06)

## 2011-01-23 LAB — HEMOGLOBIN A1C
Hgb A1c MFr Bld: 6.2 % — ABNORMAL HIGH (ref 4.6–6.1)
Mean Plasma Glucose: 131 mg/dL
Mean Plasma Glucose: 134 mg/dL

## 2011-01-23 LAB — HEPARIN LEVEL (UNFRACTIONATED)
Heparin Unfractionated: 0.93 IU/mL — ABNORMAL HIGH (ref 0.30–0.70)
Heparin Unfractionated: 1.07 IU/mL — ABNORMAL HIGH (ref 0.30–0.70)

## 2011-01-23 LAB — POCT CARDIAC MARKERS: CKMB, poc: <1 ng/mL — ABNORMAL LOW (ref 1.0–8.0)

## 2011-01-23 LAB — DIGOXIN LEVEL
Digoxin Level: 1.5 ng/mL (ref 0.8–2.0)
Digoxin Level: 2.9 ng/mL (ref 0.8–2.0)
Digoxin Level: 2.9 ng/mL (ref 0.8–2.0)

## 2011-01-23 LAB — MAGNESIUM: Magnesium: 2.2 mg/dL (ref 1.5–2.5)

## 2011-01-23 LAB — TSH: TSH: 1.28 u[IU]/mL (ref 0.350–4.500)

## 2011-01-23 LAB — PROTIME-INR
INR: 1.03 (ref 0.00–1.49)
Prothrombin Time: 13.4 seconds (ref 11.6–15.2)
Prothrombin Time: 14 seconds (ref 11.6–15.2)

## 2011-01-23 LAB — LIPASE, BLOOD: Lipase: 33 U/L (ref 11–59)

## 2011-01-23 LAB — CK TOTAL AND CKMB (NOT AT ARMC)
CK, MB: 2.3 ng/mL (ref 0.3–4.0)
Total CK: 84 U/L (ref 7–177)

## 2011-01-23 LAB — LIPID PANEL
LDL Cholesterol: 51 mg/dL (ref 0–99)
VLDL: 9 mg/dL (ref 0–40)

## 2011-01-27 LAB — CBC
Hemoglobin: 13.5 g/dL (ref 12.0–15.0)
RBC: 4 MIL/uL (ref 3.87–5.11)
RDW: 14.8 % (ref 11.5–15.5)
WBC: 8.3 10*3/uL (ref 4.0–10.5)

## 2011-01-27 LAB — BASIC METABOLIC PANEL
Calcium: 9.1 mg/dL (ref 8.4–10.5)
GFR calc Af Amer: 49 mL/min — ABNORMAL LOW (ref >60)
GFR calc non Af Amer: 41 mL/min — ABNORMAL LOW (ref >60)
Potassium: 4 mEq/L (ref 3.5–5.1)
Sodium: 142 mEq/L (ref 135–145)

## 2011-03-19 NOTE — Discharge Summary (Signed)
NAME:  Sophia Oliver, Sophia Oliver                  ACCOUNT NO.:  [id number]   MEDICAL RECORD NO.:  [id number]           PATIENT TYPE:   LOCATION:                                 FACILITY:   PHYSICIAN:  Herbie Saxon, MDDATE OF BIRTH:  11/07/1923   DATE OF ADMISSION:  01/13/2008  DATE OF DISCHARGE:  01/19/2008                               DISCHARGE SUMMARY   ADDENDUM TO INTERIM DISCHARGE SUMMARY   DISCHARGE DIAGNOSES:  Remain the same. In addition, add:  1. Lactose intolerance.  2. Deconditioning.  3. Hypertension, stable.   PROCEDURES:  1. Endoscopy.  2. Flexible sigmoidoscopy on January 19, 2008.  3. The patient is status post colon biopsy. Most of the membranes were      seen. This was performed by Dr. Virginia Rochester.   The patient's diarrhea has been slowly resolving. Questran dose was  increased, and the patient is on Flagyl and vancomycin 250 mg q.i.d.  Blood pressure control is optimized by increasing the metoprolol dose .  The patient still has anorexia, weakness, and debility.  There is a  possibility patient will be discharged back to the nursing  rehab  facility in the next one to two days once cleared by Dr. Virginia Rochester with  physical therapy and occupational therapy input.   DISCHARGE MEDICATIONS:  Medications to be dictated on final discharge.   CLINICAL CONDITION:  Stable.   PHYSICAL EXAMINATION:  On examination today, temperature is 98, pulse is  68, respiratory rate is 20, blood pressure 126/56. Clinically pale, not  jaundiced. She is fatigued, mildly clinically dehydrated.  Oropharynx and nasopharynx have clear mucosa.  NECK:  Supple.  HEART:  Heart sounds 1 and 2 regular.  CHEST:  Clear.  ABDOMEN:  Soft, nontender. Bowel sounds normoactive.  NEUROLOGIC:  She is alert and oriented to time, place, and person.  Peripheral pulses present. No edema.   AVAILABLE LABS:  Sodium is 139, potassium 3.9, chloride 106, bicarbonate  25, glucose 97, BUN 4, creatinine 0.9. The complete blood  count shows a  WBC of 9, hematocrit 32, platelet count 411.   FOLLOWUP PLAN:  Be reviewed by the primary care physician at the nursing  facility on discharge, that would be Dr. Virginia Rochester or Dr. Selena Batten, in 3-5 days.  Follow up with orthopedic surgeon, Dr. Eugenia Mcalpine, in 4 to 6 weeks.  Follow up with Dr. Virginia Rochester in 2 to 3 weeks or as needed.      Herbie Saxon, MD  Electronically Signed     MIO/MEDQ  D:  01/19/2008  T:  01/19/2008  Job:  [id number]

## 2011-03-19 NOTE — Discharge Summary (Signed)
NAME:  Sophia, Oliver                  ACCOUNT NO.:  [id number]   MEDICAL RECORD NO.:  [id number]          PATIENT TYPE:  INP   LOCATION:  2022                         FACILITY:  [facility]   PHYSICIAN:  Eduardo Osier. Sharyn Lull, M.D. DATE OF BIRTH:  February 23, 1924   DATE OF ADMISSION:  03/18/2008  DATE OF DISCHARGE:  03/20/2008                               DISCHARGE SUMMARY   ADMITTING DIAGNOSES:  1. Atrial tachyarrhythmia versus atrial flutter with 2:1 block.  2. Tachybrady syndrome, status post permanent pacemaker.  3. Hypertension.  4. Mild coronary artery disease.  5. History of carcinoma of breast.  6. Degenerative joint disease status post right femur fracture.  7. Cholelithiasis.   DISCHARGE DIAGNOSES:  1. Status post atrial tachyarrhythmias secondary to noncompliance to      medication.  2. Tachybrady syndrome status post permanent pacemaker.  3. Hypertension.  4. Mild coronary artery disease.  5. History of carcinoma of breast.  6. Glucose intolerance.  7. Degenerative joint disease status post right femur fracture in the      past.  8. Cholelithiasis.   DISCHARGE MEDICATIONS:  1. Enteric-coated aspirin 81 mg 1 tablet daily.  2. Toprol-XL 50 mg 1 tablet twice daily.  3. Digoxin 0.125 mg half tablet daily.  4. Protonix 40 mg 1 tablet daily.  5. Xanax 0.25 mg 1 tablet twice daily.  6. Tylenol Extra Strength 650 mg 1 tablet every 6-8 hours as needed      for pain.   DIET:  Low salt, low cholesterol.   DISCHARGE INSTRUCTIONS:  The patient has been advised to avoid sweets,  walk with assistance.  Follow up with me in 1 week.   CONDITION AT DISCHARGE:  Stable.   BRIEF HISTORY AND HOSPITAL COURSE:  Sophia Oliver is an 77 year old white  female with past medical history significant for atrial tachyarrhythmias  and atrial fibrillation, hypertension, mild CAD, history of breast CA,  status post right femur fracture, degenerative joint disease, and  cholelithiasis.  She came to the office  complaining of feeling dizzy  associated with palpitations since yesterday.  She did not seek any  medical attention and came to the office today on Mar 18, 2008, and was  noted to have atrial tachycardia versus atrial flutter with 2:1 block  with heart rate in 150s.  The patient was recently discharged to home  from [facility] and stopped taking her Lopressor.  Denies any chest  pain, nausea, vomiting, or diaphoresis.  Denies syncopal episode.  She  states there is a lot of stress and has recently found that her husband  may have lung tumor.   PAST MEDICAL HISTORY:  As above.   PAST SURGICAL HISTORY:  She had mastectomy and breast implants in the  past, had bilateral total knee replacements, had right femur fracture,  history of left and right rotator cuff surgery, and had thyroidectomy in  the past.   ALLERGIES:  She is allergic to CODEINE.   MEDICATIONS:  At home, she was on Diovan 320 mg p.o. daily, digoxin  0.125 mg half tablet daily, Detrol one tablet  twice daily.   SOCIAL HISTORY:  She is married, has one son, smoked half pack per day  for 1 year, quit 30 plus years ago.  Drinks wine occasionally socially,  last drink was in December.  She worked for bank in the past.   FAMILY HISTORY:  Positive for cancer.   PHYSICAL EXAMINATION:  GENERAL: She was alert, awake, oriented, very  anxious, in no acute distress.  VITAL SIGNS: Blood pressure was 110/70, pulse was 140-150.  HEENT: Conjunctivae was pink.  NECK: Supple.  No JVD.  LUNGS: Clear to auscultation without rhonchi or rales.  CARDIOVASCULAR: S1 and S2 was soft.  She was very tachycardic.  There  was soft systolic murmur.  There was S3 gallop.  ABDOMEN: Soft.  Bowel sounds were present, nontender.  EXTREMITIES: There is no clubbing, cyanosis, or edema.   LABS:  Hemoglobin was 11.1, hematocrit 31.4, and white count of 10.8.  Her TSH was okay.  Glucose was 107, BUN was 22, and creatinine 1.45.  Her CBGs in 120s to 130s.   Hemoglobin A1c was 6.2.   BRIEF HOSPITAL COURSE:  The patient was admitted to telemetry unit.  MI  was ruled out by serial enzymes and EKG.  The patient was restarted on  Lopressor.  The patient spontaneously converted to sinus rhythm with  subsequently AV-paced rhythm.  The patient remained in AV-paced rhythm.  Her digoxin level was in subtherapeutic range.  The patient did not have  any episodes of further atrial tachyrhythmias.  The patient has been  ambulating in hallway.  The patient is very eager to go home and will be  discharged home on above medications and will be followed up in my  office in 1 week.      Eduardo Osier. Sharyn Lull, M.D.  Electronically Signed     MNH/MEDQ  D:  03/20/2008  T:  03/20/2008  Job:  [id number]

## 2011-03-19 NOTE — Letter (Signed)
April 12, 2008    Eduardo Osier. Harwani, M.D.  [address]  [place]  [place] [address]   RE:  Sophia Oliver, Sophia Oliver  MRN:  [id number]  /  DOB:  [birth year]   Dear Dr. Sharyn Lull,   Sophia Oliver comes in today for her pacemaker followup.  She describes a  fall in February which was syncopal, and that resulted in a leg  fracture.  She turned out to be anemic on arrival to [facility].  It turns out, also, that on interrogation of her device today  that she had a rapid supraventricular tachycardia that evening prior to  her fall.   She apparently also was recently hospitalized under your care for  recurrent arrhythmias.  Her chart describes atrial fibrillation as well  as an atrial tachycardia.   Her medications include currently Toprol 50 b.i.d., the cardia having  been discontinued.  She is also on Diovan 320.  Digoxin 0.125, Xanax,  Protonix, and Cymbalta.   On examination her blood pressure was 135/74.  Her pulse was 59.  Her  weight was 121.  Her neck veins were flat.  Her carotids were brisk.  Her lungs were clear.  Her heart sounds were regular.  The abdomen was  soft.  The extremities were without edema.  Her neurological exam was  grossly normal, and the skin was warm and dry.   Interrogation of her Guidant pulse generator demonstrates that she had  no recorded sensed atrial amplitude.  The impedance was 550, and the  threshold was 0.8 at 0.4.  the R wave was greater than 12 with impedance  of 560, and a threshold of 1.5 at 0.6.  The tachycardia is interesting  in that the electrogram morphology is quite consistent with sinus with  an RSR prime, S prime, R prime prime, and the ventricular response is  relatively regular, notwithstanding the fact that there is a great deal  of variability in the AA intervals.  This suggests some degree of  dissociation or perhaps just regularization of the other ventricular  response given the electrogram morphology, this would probably be  the  more likely.   The atrial cycle length was approximately 320 msec, and the average  ventricular cycle length was about the same, what not, translating from  top-to-bottom being PACs that were associated with a compensatory pause  the atrial electrogram.  I wonder if this does not represent an atrial  tachycardia with 1:1 conduction at a site relatively remote from the  pacemaker lead resulting in some type of atrial trigeminy with competing  atrial foci.   IMPRESSION:  1. Sinus node dysfunction.  2. Recurrent atrial arrhythmias.  3. Status post pacer for #1.  4. Syncopal episode in February associated with leg fracture preceded      by #2, and accompanied by anemia.  5. Recurrent recent hospitalization for tachycardia.   Dr. Sharyn Lull, Sophia Oliver has quite serious arrhythmias in that they are  associated with significant morbidity.  Clearly the anemia might have  compounded her propensity towards a neurally mediated hypotensive  episode which could have an triggered by supraventricular tachycardia.  I suspect that this is likely the mechanism.  Given the recurrent event  in May, I think, that it is important for [place] to seek out a relatively  definitive solution for her tachyarrhythmias so as to try to prevent  further significant falls.  I will review the electrocardiograms from  May, and then we  can talk about whether medical therapy or ablative  therapy is the more likely to be beneficial.   Thank you for allowing [place] to participate in her care.    Sincerely,      Duke Salvia, MD, [facility]  Electronically Signed    SCK/MedQ  DD: 04/12/2008  DT: 04/12/2008  Job #: [id number]

## 2011-03-19 NOTE — Op Note (Signed)
NAME:  Sophia Oliver, Sophia Oliver NO.:  [id number]   MEDICAL RECORD NO.:  [id number]          PATIENT TYPE:  INP   LOCATION:  5039                         FACILITY:  [facility]   PHYSICIAN:  Georgiana Spinner, M.D.    DATE OF BIRTH:  November 19, 1923   DATE OF PROCEDURE:  01/19/2008  DATE OF DISCHARGE:                               OPERATIVE REPORT   PROCEDURE:  Upper endoscopy.   INDICATIONS:  Diarrhea and vomiting.   ANESTHESIA:  Fentanyl 50 mcg, Versed 5 mg.   PROCEDURE:  With the patient mildly sedated in the left lateral  decubitus position, the Pentax videoscopic endoscope was inserted in the  mouth, and passed under direct vision through the esophagus; which  appeared normal  The stomach fundus, body, antrum appeared normal, as  did the duodenal bulb and second portion of the duodenum;  at which  point I elected to take mucosal biopsies.  From this point, the  endoscope was slowly withdrawn, taking circumferential views of the  duodenal mucosa, until the endoscope had been pulled back into the  stomach and placed in retroflexion to view the stomach from below.  The  endoscope was straightened and withdrawn, taking circumferential views  of the remaining gastric and esophageal mucosa.  The patient's vital  signs, pulse oximetry remained stable.  The patient tolerated the  procedure well without apparent complications.   FINDINGS:  Unremarkable examination with biopsies taken.   PLAN:  Await biopsy report.  The patient will follow up with me; still  in the hospital and proceed to flexible sigmoidoscopy.           ______________________________  Georgiana Spinner, M.D.     GMO/MEDQ  D:  01/19/2008  T:  01/19/2008  Job:  [id number]

## 2011-03-19 NOTE — Discharge Summary (Signed)
NAME:  Sophia Oliver, Sophia Oliver NO.:  [id number]   MEDICAL RECORD NO.:  [id number]          PATIENT TYPE:  INP   LOCATION:  5039                         FACILITY:  [facility]   PHYSICIAN:  Altha Harm, MDDATE OF BIRTH:  02-05-1924   DATE OF ADMISSION:  01/05/2008  DATE OF DISCHARGE:  01/21/2008                               DISCHARGE SUMMARY   DISCHARGE PHYSICIAN:  [facility].   DISCHARGE DIAGNOSES:  Please see the interim summary done on March 17  and March 10 for details of discharge diagnoses.  Please note,  additional discharge diagnoses:  Urinary incontinence, urge and  functional incontinence.   DISCHARGE MEDICATIONS:  1. Xanax 0.25 mg p.o. b.i.d.  2. Norvasc 10 mg p.o. daily.  3 . Questran 4 g p.o. q.24 h.  1. Metoprolol 25 mg p.o. b.i.d.  2. Protonix 40 mg p.o. b.i.d.  3. Detrol 1 mg p.o. b.i.d.  4. Lomotil one tablet p.o. t.i.d.  5. Aspirin 81 mg p.o. daily.  6. Diovan 320 mg p.o. daily.  7. Digoxin 0.125 mg daily.   PROCEDURES:  No new procedures since January 19, 2008.   DIAGNOSTIC STUDIES:  No new diagnostic studies since January 19, 2008.   HOSPITAL COURSE:  Up until March 17, please see the interim discharge  summary.  Hospital course beyond that:  1. The patient continued to be observed for diarrhea.  The patient has      had no further diarrheal stools since January 19, 2008.  The patient      has poor oral intake and has been seen by our Nutrition Services      here who have recommended a lactose-free Ensure supplementary diet.      The patient still has had suboptimal oral intake and is being      encouraged to eat more.  The patient has had no episodes of      vomiting for quite some time.  This has been discussed with Dr. Virginia Rochester      who feels that the patient can be discharged and followed up as an      outpatient.  Please note that the biopsies are pending and the      patient will follow up with Dr. Virginia Rochester in the office for  further      management and based upon the results of the biopsy.  2. Urinary incontinence.  In discussion with the patient's son, he      describes the patient as having complete lack of control of her      bladder.  However, when I spoke with the patient today, she      indicated that the patient did have some warning that she was going      to urinate, however, was unable to control her bladder or be mobile      enough to get to the bathroom prior to having incontinence.  This      constitutes urge and functional incontinence.  The patient was      started on Detrol 1 mg.  Please note that the patient's son thinks      that the patient may have been on Detrol prior to this without very      good results.  I did contact [facility] to see the patient.      However, they will not be able to see the patient until later this      evening.  It is felt that this problem does not require      hospitalization, and the patient and her son have been advised to      follow up with Urology as an outpatient.  Otherwise, the patient      has remained stable.  She is afebrile.  Vital signs are stable.      Electrolytes were all within normal limits, and they were followed      on the day of discharge.  WBC 8.1, hemoglobin 12.4, hematocrit      36.3, platelets 362.  Sodium 139, potassium 4.7, chloride 106,      bicarbonate 25, BUN 8, creatinine 1.05.   FOLLOWUP:  The patient is to follow up with Dr. Sabino Gasser in the office  in one week.  She is to follow up with her primary care physician, Dr.  Selena Batten, on a p.r.n. basis.  The patient is being discharged to [facility] to  continue with physical therapy for rehabilitation of her knee.   DISCHARGE INSTRUCTIONS:  1. Dietary restrictions:  At this time, given the patient's very poor      oral intake, I would not restrict her diet except to refrain from      dairy products which contain lactose.  The patient should have      lactose-free Ensure t.i.d.  to supplement her meals in addition.  2. Physical restrictions:  As directed by physical therapy and as      tolerated.      Altha Harm, MD  Electronically Signed     MAM/MEDQ  D:  01/21/2008  T:  01/21/2008  Job:  [id number]   cc:   Georgiana Spinner, M.D.  Massie Maroon, MD

## 2011-03-19 NOTE — Assessment & Plan Note (Signed)
[facility]                         ELECTROPHYSIOLOGY OFFICE NOTE   MARIGNY, BORRE                         MRN:          [id number]  DATE:08/31/2007                            DOB:          1924/04/27    HISTORY OF PRESENT ILLNESS:  Ms. Aune was seen today in the device  clinic at Dr. Annitta Jersey request for dizziness.  She has a Building services engineer pacemaker that was implanted on October 22, 2005, for sinus  node dysfunction and atrial fibrillation.  Interrogation of her device  today demonstrates that she was paced to a rate of 30 in the atrium.  Her atrial impendence was 590 ohms with a threshold of 1 volt at 0.4  milliseconds.  Her R waves measured greater than 12 millivolts with an  RV impendence of 550 ohms and threshold of 1.2 volts at 0.2  milliseconds.  Her battery was at beginning of life with an estimated of  greater than 5 years longevity.  She had one ventricular high rate  episode on October 23 which self terminated.  She is programmed DDDR  with lower rate of 60 and an upper of 130 with a dynamic AV delay of 200-  300 milliseconds.  She is currently a-pacing 79% of the time and V  pacing 12% of the time.  Her heart rate excursion was a little flat, but  she states she is able to do what she wants to do with enough energy.  Unfortunately, no cause for Ms. Ringenberg's dizziness was determined today.  She does have an office visit in December with Dr. Graciela Husbands which I have  encouraged her to keep.      Gypsy Balsam, RN,BSN  Electronically Signed      Duke Salvia, MD, [facility]  Electronically Signed   AS/MedQ  DD: 08/31/2007  DT: 09/01/2007  Job #: [id number]   cc:   Eduardo Osier. Sharyn Lull, M.D.

## 2011-03-19 NOTE — H&P (Signed)
NAME:  Sophia Oliver, Sophia Oliver                  ACCOUNT NO.:  [id number]   MEDICAL RECORD NO.:  [id number]          PATIENT TYPE:  INP   LOCATION:  5039                         FACILITY:  [facility]   PHYSICIAN:  Lonia Blood, M.D.DATE OF BIRTH:  03-03-1924   DATE OF ADMISSION:  01/04/2008  DATE OF DISCHARGE:                              HISTORY & PHYSICAL   PRIMARY CARE PHYSICIAN:  Massie Maroon, M.D.   CHIEF COMPLAINT:  Intractable nausea, vomiting, and diarrhea.   PRESENT ILLNESS:  Sophia Oliver is a very pleasant 77 year old  female with the below-listed chronic medical problems who presented to  the hospital on transfer from a local rehab facility.  She was admitted  to the hospital in the later half of 2009-02-27after she had passed  out and suffered a periprosthetic right femur fracture.  Her fracture  was corrected, her syncope was addressed, and she was placed in  [facility] for physical rehab.  Since arriving there,  she stated she has had very poor appetite and intermittent stomach  upset.  Today, however, she awoke with an intense feeling of nausea.  As  the day progressed, she began to experience multiple episodes of  vomiting.  There was no hematemesis.  There was no hematochezia or  melena.  After multiple episodes of nausea and vomiting, she then began  to develop explosive watery, foul-smelling, mucusy diarrhea.  She has  not had diarrhea of this type before.  She has had stomach cramps  associated with this.  Again, there has been no blood in the diarrhea.  Because of her inability to maintain p.o. intake and the aggressive  nature of her diarrhea, she reported her symptoms to the nurse and was  transferred to the ER for evaluation.   At the present time, the patient specifically denies any chest pain,  fevers, chills, nausea or vomiting, headache, or focal neurologic  deficits.  She is not having any exquisite pain in her affected right  knee.   PAST MEDICAL HISTORY:  1. Fall with periprosthetic distal femur fracture in 01-01-2008     status post surgical repair.  2. History of total shoulder replacement at [facility].  3. History of total hip replacement at [facility]  4. History of bilateral total knee replacements by Dr. Erasmo Leventhal in [place].  5. SVT/tachybrady syndrome, status post pacemaker placement in 2007.  6. Coronary artery disease.  7. History of breast cancer bilaterally, with a left mastectomy and      radiation therapy to the right  8. Status post hysterectomy.  9. Frequent atrial arrhythmias, with left bundle branch block leading      to digoxin therapy and beta blocker therapy initiated in 2008-01-01.   OUTPATIENT MEDICATIONS:  1. Xanax 0.25 mg b.i.d.  2. Digoxin 0.125 mg p.o. daily  3. Iron sulfate 325 mg b.i.d.  4. Colace 100 mg b.i.d.  5. Vicodin 1 p.o. b.i.d.  6. Metoprolol 25 mg p.o. b.i.d.  7.  Protonix 40 mg p.o. b.i.d.   ALLERGIES:  CODEINE.   FAMILY HISTORY:  Noncontributory secondary to age.   SOCIAL HISTORY:  The patient does not smoke.  She does not drink.  She  is recently residing at [facility], where she is getting rehab, but  she normally lives independently with her husband.   DATA REVIEW:  Hemoglobin is normal, platelet count is normal.  White  count is elevated at 15,000.  CMET is remarkable for elevated BUN at 20,  creatinine of 1.12, serum glucose elevated at 144, and LFTs all normal.  Lipase is normal.  Urinalysis reveals 40 ketones, 100 protein, small  leukocyte esterase, and 3-6 white blood cells.   PHYSICAL EXAMINATION:  VITAL SIGNS:  Temperature 99.2, blood pressure  178/82, heart rate 73, respiratory rate 16, O2 saturation is 97% on room  air.  GENERAL:  Well-developed, well-nourished female, in no acute respiratory  distress.  HEENT:  Normocephalic, atraumatic.  Pupils equal, round, and reactive to  light and accommodation.   Extraocular muscles intact bilaterally.  OC/OP  clear.  NECK:  No JVD.  No lymphadenopathy.  LUNGS:  Clear to auscultation bilaterally.  CARDIOVASCULAR:  Regular rate and rhythm, without gallop or rub at the  present time.  ABDOMEN:  Nontender, nondistended, soft.  Bowel sounds present.  No  hepatosplenomegaly, no rebound, no ascites.  EXTREMITIES:  Right lower extremity is in a brace about the right knee.  There is no significant cyanosis, clubbing, or edema of the bilateral  lower extremities.  NEUROLOGIC:  Nonfocal neurologic exam.   IMPRESSION AND PLAN:  1. Probable Clostridium difficile colitis.  Given the patient's recent      hospitalization, her perioperative antibiotic therapy, and then her      placement within a rehab facility, not to mention the specific      description of the patient's diarrhea, it is highly likely this      patient is suffering with a Clostridium difficile colitis.  Pretest      probability is sufficiently high to warrant empiric Flagyl therapy.      I will initiate this.  I will send has Clostridium difficile  toxin      x3.  I feel that this likely explains the patient's nausea,      vomiting, and diarrhea.  2. Mildly positive urinalysis.  The patient does have a positive UA.      In the setting of a probable Clostridium difficile colitis,      however, I do not feel that empiric antibiotic is a wise choice.  I      will check a urine culture, and we will hold any further antibiotic      treatment unless this is strongly positive.  3. Recent distal right femoral fracture.  The patient appears to be      improving nicely from this standpoint.  We will continue physical      therapy and occupational therapy and ask and Dr. Erasmo Leventhal to evaluate if complications are encountered.  4. Tachybrady syndrome, with pacer.  There is no indication that the      pacemaker is malfunctioning at the present time.  5. Recently diagnosed atrial  arrhythmias, with a left bundle branch      block.  At the present time, the patient's heart rate is well      controlled.  We will follow her on telemetry given this recent  difficulty and adjust her chronotropic medications as indicated.      Lonia Blood, M.D.  Electronically Signed     JTM/MEDQ  D:  01/05/2008  T:  01/05/2008  Job:  [id number]   cc:   Massie Maroon, MD

## 2011-03-19 NOTE — Assessment & Plan Note (Signed)
[facility]                         ELECTROPHYSIOLOGY OFFICE NOTE   ROCQUEL, ASKREN                         MRN:          [id number]  DATE:11/29/2008                            DOB:          [birth year]    Ms. Sessa comes in followup for tachybrady syndrome and has had syncopal  tachycardia.  She has been managed medically, and is currently taking  metoprolol 75 b.i.d.  She has had no recurrent palpitations.  She has  had no recurrent problems with lightheadedness or falls.  She has had no  chest pain.  Her previous complaint is that her leg is still recovering,  albeit only slowly.   PHYSICAL EXAMINATION:  VITAL SIGNS:  Her blood pressure is 147/85, her  pulse is 70, and her weight is 138.  LUNGS:  Clear.  HEART:  Sounds were regular.  EXTREMITIES:  Without edema.  ABDOMEN:  Soft.  GENERAL:  She is in no acute distress.   Interrogation of her Guidant pulse generator demonstrates a P-wave of  0.6 with impedance of 444 and threshold of 0.7 at 0.4.  The R-wave was  10 with impedance of 500 and threshold of 1.8 at 0.6.   IMPRESSION:  1. Sinus node dysfunction.  2. Supraventricular tachycardia - atrial fibrillation.  3. Status post pacemaker for #1.  4. Falls related to #2.  5. Dyspnea.   Ms. Britz dyspnea is her biggest complaint.  Metoprolol was recently  increased.  Her dyspnea has not changed appreciably.  She is following  up with Dr. Sharyn Lull.  I will defer to him further evaluation of her  myocardial performance.   Her pacemaker function is normal.  There is reasonable heart rate  excursion, I do not think this is contributing to her dyspnea.   It is also unlikely that the 33% ventricular pacing is contributing to  the left ventricular dysfunction.   We will plan to see her again in 6 months' time.  She will follow up  with Dr. Sharyn Lull as otherwise scheduled.     Duke Salvia, MD, [facility]  Electronically Signed    SCK/MedQ   DD: 11/29/2008  DT: 11/30/2008  Job #: [id number]   cc:   Eduardo Osier. Sharyn Lull, M.D.  [facility]

## 2011-03-19 NOTE — H&P (Signed)
NAME:  KALECIA, HARTNEY NO.:  [id number]   MEDICAL RECORD NO.:  [id number]          PATIENT TYPE:  EMS   LOCATION:  ED                           FACILITY:  [facility]   PHYSICIAN:  Lucita Ferrara, MD         DATE OF BIRTH:  20-Jan-1924   DATE OF ADMISSION:  12/22/2007  DATE OF DISCHARGE:                              HISTORY & PHYSICAL   PRIMARY CARE PHYSICIAN:  Massie Maroon, M.D.   CARDIOLOGISTEduardo Osier Sharyn Lull, M.D.   The patient is an 77 year old status post fall and status post  comminuted distal femoral fracture extending to the femur prosthesis.  The patient is status post knee replacement and bilateral shoulder  replacement.  The patient had a significant osteoporosis and significant  complex past medical problems.  There was no prodrome of chest pain or  shortness of breath, nausea or vomiting, dizziness or focal neurological  deficits.  The patient also did not have any palpitations.  She is also  status post pacemaker for sinus node dysfunction and atrial  fibrillation.  Last time it was interrogated was August 31, 2007, which  was normal.  The patient was dizzy prior to fall.  She has been quite  dizzy the last 2-3 days.  She presented to Dr. Selena Batten yesterday.  Dr. Selena Batten  apparently either decreased or discontinued her diuresis.   The patient was evaluated by Dr. Thomasena Edis in the emergency room.  In the  emergency room, she has mild leukocytosis, a normal hemoglobin and  hematocrit although she is quite dry, and thrombocytosis.  Her BUN and  creatinine were somewhat elevated at 38 and 1.59.  Her urinalysis is  negative.  First set of cardiac markers are also negative.  She was  evaluated and splinted in the emergency room.  I spoke to Dr. Thomasena Edis  and the patient will need surgery when medically stable within the next  48 hours.  Otherwise the 12-point review of systems is negative.   PAST MEDICAL HISTORY:  1. Total shoulder replacement at [facility].  2. History of SVT,  tachy brady syndrome status post pacemaker.  3. History of coronary artery disease.  4. History of renal insufficiency and dehydration.   PAST SURGICAL HISTORY:  The patient had a dual chamber pacemaker  implantation in 2007.   MEDICATIONS:  1. Hydrochlorothiazide.  2. Propoxyphene unknown doses, not documented here in the emergency      room.   ADDENDUM:   ALLERGIES:  Patient has no known drug allergies.   PHYSICAL EXAMINATION:  VITAL SIGNS:  Temperature 97.4, blood pressure  95/60, pulse 156, respirations 20.  Pulse ox is 97% on room air.  HEENT:  Normocephalic, atraumatic.  Sclerae are anicteric.  Extraocular  muscles intact.  NECK:  Supple.  No JVD.  No carotid bruits.  CARDIOVASCULAR:  S1, S2, tachy.  ABDOMEN:  Soft, nontender, nondistended.  Positive bowel sounds.  LUNGS:  Clear to auscultation bilaterally.  No rhonchi, rales or  wheezes.  EXTREMITIES:  Patient braced on the right side.   Laboratory data  shows CBC with hemoglobin 12.1, hematocrit 35.2.  BMET:  BUN of 38, creatinine 1.59, calcium 9.2.  Urinalysis shows negative leuk  esterase.  D-dimer is positive at 8.16.   RADIOLOGY:  Patient had a knee x-ray which showed comminuted distal  fracture of the femur extending to the femoral prosthesis.  CT angio is  pending.   ASSESSMENT AND PLAN:  An 77 year old with:  1. Comminuted fracture of the right femur, extending to the femur      prosthesis.  Patient was splinted in the emergency room by      orthopedic surgeon.  Will go ahead and admit.  Pain control with      morphine and Robaxin for spasm.  Patient may get surgical      intervention once and if clinically stable.  2. Dizziness, fall.  Rule out ischemia.  Rule out pulmonary embolism.      Rule out myocardial infarction.  Rule out malfunction of pacer.      EKG shows supraventricular tachycardia with left bundle branch      block.  Patient's cardiologist was called and I am going to go      ahead and  re-consult for evaluation and pre-op clearance.  The      patient's pacemaker will most likely need to be interrogated.      Patient has positive D-dimer and patient needs to be ruled out for      pulmonary embolism, which can contribute to the sinus tachycardia.  3. Positive D-dimer.  Patient will undergo a CT angiography right now      to rule out pulmonary embolism.  Management will depend on results.      NOTE. CTA was ordered, but IV site infiltrated after patient      recieved contrast. In discussion with radiologist, he (the      radiologist) reccomended NOT to proceed with CTA to avoid risk for      contrast nephropathy. Will proceed with v/q scan.  4. Mild dehydration and prerenal state.  Will give her intravenous      fluids for now.  Hold hydrochlorothiazide.  Will monitor hemoglobin      and hematocrit.  Monitor electrolytes.  If patient's hemoglobin      drops, will transfuse.  Intravenous fluids, strict ins and outs.  A      2 dimensional echocardiogram.   1. DVT px with heparing 5000 sq every 8 hrs. In anticopation of      surgery this can be stoped 6 hours prior to surgery. Workup is      currently in process.  Decision to fully anti-coagulate will depend      on v/q scan result and complex integration of risks and benefits      and recommendations from consultants.      Lucita Ferrara, MD  Electronically Signed     RR/MEDQ  D:  12/22/2007  T:  12/23/2007  Job:  [id number]

## 2011-03-19 NOTE — H&P (Signed)
NAME:  NIEMAH, SCHWEBKE NO.:  [id number]   MEDICAL RECORD NO.:  [id number]          PATIENT TYPE:  EMS   LOCATION:  ED                           FACILITY:  [facility]   PHYSICIAN:  Lucita Ferrara, MD         DATE OF BIRTH:  Jul 13, 1924   DATE OF ADMISSION:  12/22/2007  DATE OF DISCHARGE:                              HISTORY & PHYSICAL   ADDENDUM:   ALLERGIES:  Patient has no known drug allergies.   PHYSICAL EXAMINATION:  VITAL SIGNS:  Temperature 97.4, blood pressure  95/60, pulse 156, respirations 20.  Pulse ox is 97% on room air.  HEENT:  Normocephalic, atraumatic.  Sclerae are anicteric.  Extraocular  muscles intact.  NECK:  Supple.  No JVD.  No carotid bruits.  CARDIOVASCULAR:  S1, S2, tachy.  ABDOMEN:  Soft, nontender, nondistended.  Positive bowel sounds.  LUNGS:  Clear to auscultation bilaterally.  No rhonchi, rales or  wheezes.  EXTREMITIES:  Patient braced on the right side.   Laboratory data shows CBC with hemoglobin 12.1, hematocrit 35.2.  BMET:  BUN of 38, creatinine 1.59, calcium 9.2.  Urinalysis shows negative leuk  esterase.  D-dimer is positive at 8.16.   RADIOLOGY:  Patient had a knee x-ray which showed comminuted distal  fracture of the femur extending to the femoral prosthesis.  CT angio is  pending.   ASSESSMENT AND PLAN:  An 77 year old with:  1. Comminuted fracture of the right femur, extending to the femur      prosthesis.  Patient was splinted in the emergency room by      orthopedic surgeon.  Will go ahead and admit.  Pain control with      morphine and Robaxin for spasm.  Patient may get surgical      intervention once and if clinically stable.  2. Dizziness, fall.  Rule out ischemia.  Rule out pulmonary embolism.      Rule out myocardial infarction.  Rule out arrhythmia.  EKG shows      supraventricular tachycardia with left bundle branch block.      Patient's cardiologist was called and I am going to go ahead and re-      consult for  evaluation.  The patient's pacemaker will most likely      need to be interrogated.  Patient has positive D-dimer and patient      needs to be ruled out for pulmonary embolism, which can contribute      to the sinus tachycardia.  3. Positive D-dimer.  Patient will undergo a CT angiography right now      to rule out pulmonary embolism.  Management will depend on results.  4. Mild dehydration and prerenal state.  Will give her intravenous      fluids for now.  Hold hydrochlorothiazide.  Will monitor hemoglobin      and hematocrit.  Monitor electrolytes.  If patient's hemoglobin      drops, will transfuse.  Intravenous fluids, strict ins and outs.  A      2 dimensional echocardiogram.  Workup is currently in process.  Decision to anti-coagulate will depend  on complex integration of risks and benefits and recommendations from  consultants.  I will put an addendum to current management.      Lucita Ferrara, MD  Electronically Signed     RR/MEDQ  D:  12/23/2007  T:  12/23/2007  Job:  [id number]

## 2011-03-19 NOTE — Discharge Summary (Signed)
NAME:  Sophia Oliver, Sophia Oliver                  ACCOUNT NO.:  [id number]   MEDICAL RECORD NO.:  [id number]          PATIENT TYPE:  INP   LOCATION:  1409                         FACILITY:  [facility]   PHYSICIAN:  Michaelyn Barter, M.D. DATE OF BIRTH:  [birth year]   DATE OF ADMISSION:  12/22/2007  DATE OF DISCHARGE:  12/29/2007                               DISCHARGE SUMMARY   ADDENDUM:  This is a final discharge summary that will follow that  dictated by Isidor Holts, M.D., on February 21.  For events that  occurred prior to February 21, please see the discharge summary that was  dictated by Dr. Isidor Holts.   FINAL DIAGNOSES:  1. Right distal femoral fracture, status post open reduction and      internal fixation December 24, 2007.  2. Dehydration.  3. Renal insufficiency.  4. Acute blood loss anemia.  5. Wide-complex tachycardia.   CONSULTATIONS:  1. Cardiology with Doylene Canning. Ladona Ridgel, MD.  2. Orthopedic surgery with Madlyn Frankel. Charlann Boxer, MD.   HOSPITAL COURSE:  Problem 1.  WIDE-COMPLEX TACHYCARDIA:  Cardiology continue to follow the  patient.   Problem 2.  DEHYDRATION:  This resolved.   Problem 3.  Open reduction and internal fixation of the right distal  femoral fracture.  Dr. Charlann Boxer last saw the patient on December 29, 2007,  which is today.  He recommended nonweightbearing on the right lower  extremity .   Currently the patient indicates that she feels better.  She has no  specific complaints and she shows no obvious distress.  The decision has  been made to discharge her from the hospital to [facility] nursing  facility.   The patient's vitals on the date of discharge:  The temperature is 97.8,  heart rate is 59-60, her respirations are 18, blood pressure is 177/69.  Her O2 saturation is 100% on 2 L of oxygen.   The patient's medications at the time of discharge will consist of the  following:  1. Xanax 0.25 mg p.o. b.i.d.  2. Digoxin 0.125 mg p.o. daily.  3. Colace 100 mg p.o.  b.i.d.  4. Ferrous sulfate 325 mg p.o. b.i.d.  5. Vicodin one tablet p.o. b.i.d.  6. Metoprolol 25 mg p.o. t.i.d.  7. Protonix 40 mg p.o. b.i.d.  8. MiraLax 17 g in 8 ounces of fluid p.o. daily.      Michaelyn Barter, M.D.  Electronically Signed     OR/MEDQ  D:  12/29/2007  T:  12/29/2007  Job:  [id number]

## 2011-03-19 NOTE — Discharge Summary (Signed)
NAME:  Sophia Oliver, Sophia Oliver                  ACCOUNT NO.:  [id number]   MEDICAL RECORD NO.:  [id number]          PATIENT TYPE:  INP   LOCATION:  5039                         FACILITY:  [facility]   PHYSICIAN:  Ladell Pier, M.D.   DATE OF BIRTH:  [birth year]   DATE OF ADMISSION:  01/04/2008  DATE OF DISCHARGE:                               DISCHARGE SUMMARY   DATE OF DISCHARGE:  To be determined.   DISCHARGE DIAGNOSES:  1. Diarrhea.  2. Nausea, vomiting.  3. Anemia.  4. Hypokalemia.  5. Hypertension.  6. Fall with periprosthetic distal femur fracture in February, 2009,      status post surgical repair.  7. History of total shoulder replacement at [facility].  8. History of total hip replacement at [facility].  9. History of bilateral total knee replacement by Erasmo Leventhal in [place].  10.History of supraventricular tachycardia/tachycardia-bradycardia      syndrome, status post pacemaker placement in 2007.  11.Coronary artery disease.  12.History of breast cancer bilaterally with left mastectomy and      radiation therapy to the right.  13.Status post hysterectomy.  14.Frequent atrial arrhythmias with left bundle branch block leading      to dig therapy and beta-blocker therapy initiated February, 2009.   DISCHARGE MEDICATIONS:  To be determined.   CONSULTANTS:  GI, Dr. Virginia Rochester.   PROCEDURES:  None.   HPI:  The patient is a pleasant 77 year old white female, multiple  medical problems, that was brought to the hospital from [facility].  She suffered a right femur fracture secondary to a syncopal  episode and was getting rehab at [facility].  Since arriving there she  has had very poor appetite, her stomach has been upset, then she  developed nausea, vomiting, diarrhea.  Please see Admission Note for  remainder of history.   PAST MEDICAL HISTORY/FAMILY HISTORY/SOCIAL HISTORY/MEDS/ALLERGIES/REVIEW  OF SYSTEMS:  Per Admission H&P.   PHYSICAL EXAM ON DISCHARGE:   Temperature 97.5, pulse 60, respirations  20, blood pressure 136/70, pulse of 99% on room air.  HEENT:  Head normocephalic, atraumatic.  Pupils reactive to light.  Throat without erythema.  CARDIOVASCULAR:  Regular rate and rhythm.  LUNGS:  Clear bilaterally.  ABDOMEN:  Soft, positive bowel sounds.  EXTREMITIES:  Without edema.   HOSPITAL COURSE:  1. Diarrhea:  The patient was started on IV Flagyl without any      improvement.  Cipro was then added.  Diarrhea persisted.  GI was      consulted.  Dr. Marina Goodell was on for Dr. Virginia Rochester, saw patient and      discontinued Cipro and Flagyl, started her on a vanc p.o.  Diarrhea      has slowly been improving.  She is also on Probiotic.  Will      continue that regimen.  She is on a low residue diet at present.      Diarrhea is improving.  2. Nausea, vomiting:  She did develop some nausea, vomiting initially      and that has been improving over the  course of hospitalization.      She is tolerating her diet well.  3. Anemia:  She does have a history of anemia, hemoglobin at present      is stable at 11.8, will continue to monitor.  4. Hypokalemia:  With the nausea, vomiting and diarrhea she did have a      bout of hypokalemia that was repleted, that is now resolved.  5. Hypertension:  The blood pressure has been running mildly elevated.      Norvasc was added to her regimen.  Will continue to titrate her      medications and adjust accordingly for optimal blood pressure      control.  6. History of arrhythmias:  No arrhythmic events while inpatient.  7. Urinary tract infection:  She was noted to have a urinary tract      infection and was treated with Cipro x3 days.  Cipro has also now      been discontinued.  8. Hip fracture:  On admission she still had the staples in from the      hip surgery.  Ortho was consulted and the staples were removed.      Orthopedics was consulted and they did come by and take a look at      the hip, it looks good.    DISCHARGE LABS:  WBC 8.8, hemoglobin 11.8, platelets 462,000, magnesium  1.5, sodium 142, potassium 3.8, chloride 114, CO2 22, glucose 133, BUN  2, creatinine 0.96, calcium 8.6.  C. diff negative x3.  Urine culture  positive for E. coli, sensitive to Cipro.  X-ray of the abdomen is  negative.      Ladell Pier, M.D.  Electronically Signed     NJ/MEDQ  D:  01/12/2008  T:  01/12/2008  Job:  [id number]   cc:   Massie Maroon, MD

## 2011-03-19 NOTE — Op Note (Signed)
NAME:  CONTRINA, ORONA NO.:  [id number]   MEDICAL RECORD NO.:  [id number]          PATIENT TYPE:  INP   LOCATION:  5039                         FACILITY:  [facility]   PHYSICIAN:  Georgiana Spinner, M.D.    DATE OF BIRTH:  1924-07-22   DATE OF PROCEDURE:  01/19/2008  DATE OF DISCHARGE:                               OPERATIVE REPORT   PROCEDURE:  Flexible sigmoidoscopy.   ENDOSCOPIST:  Georgiana Spinner, M.D.   INDICATIONS FOR PROCEDURE:  Diarrhea.   ANESTHESIA:  None further given.   DESCRIPTION OF PROCEDURE:  With the patient mildly sedated in the left  lateral decubitus position, the Pentax videoscopic colonoscope was  inserted into the rectum and passed to approximately 25 cm.  The prep  was poor.  She had a tap water enema in the morning, but mucosa that  could be seen appeared normal.  Biopsies were taken from this normal-  appearing mucosa.  The endoscope was withdrawn.  The stool itself  appeared brown and appeared to have probably Cholestyramine powder mixed  in with it.  The endoscope was withdrawn.  The patient's vital signs and  pulse oximetry remained stable.   The patient tolerated the procedure well.  There were no apparent  complications.   FINDINGS:  Negative examination limited to 25 cm.  Biopsies taken.  Await the biopsy report.           ______________________________  Georgiana Spinner, M.D.     GMO/MEDQ  D:  01/19/2008  T:  01/19/2008  Job:  [id number]

## 2011-03-19 NOTE — Discharge Summary (Signed)
NAME:  Sophia Oliver, CANALE                  ACCOUNT NO.:  [id number]   MEDICAL RECORD NO.:  [id number]          PATIENT TYPE:  INP   LOCATION:  1409                         FACILITY:  [facility]   PHYSICIAN:  Isidor Holts, M.D.  DATE OF BIRTH:  12/31/1923   DATE OF ADMISSION:  12/22/2007  DATE OF DISCHARGE:                               DISCHARGE SUMMARY   DATE OF DISCHARGE:  To be determined.   PRIMARY MEDICAL DOCTOR:  Dr. Pearson Grippe   PRIMARY CARDIOLOGIST:  Dr. Meda Coffee   DISCHARGE DIAGNOSES:  1. Right distal femoral fracture, status post open reduction internal      fixation December 24, 2007.  2. Dehydration/acute renal insufficiency.  3. Acute blood loss anemia secondary to #1 above. Required transfusion      2 units packed red blood cells on December 24, 2007.  4. Broad-complex tachyarrhythmia.  5. History of coronary artery disease.  6. History of sick sinus syndrome status post permanent pacer 2007.  7. History of breast cancer status post left mastectomy, status post      radiation therapy right breast.   DISCHARGE MEDICATIONS:  This will be listed in addendum at the  appropriate time by discharging MD.   PROCEDURES:  1. X-ray right knee dated December 22, 2007.  This showed total knee      arthroplasty.  There is a comminuted distal femoral fracture      extending to the femoral prosthetic component.  2. X-ray left hip dated December 22, 2007.  This showed no acute bony      injury.  3. Chest x-ray dated December 22, 2007.  This showed bibasilar      atelectasis and chronic changes.  4. Ventilation perfusion lung scan dated December 23, 2007.  This      showed no evidence of pulmonary embolism.  5. Chest CT scan dated December 23, 2007.  This was a suboptimal study      to fully assess for pulmonary embolus but no large or central      pulmonary emboli seen.  There was cardiomegaly, dependent      atelectasis, and small hiatal hernia.  6. A 2-D echocardiogram dated  December 23, 2007.  This shows overall      normal left ventricular systolic function, LVEF 55% to 60%.  No      left ventricular regional wall motion abnormalities.  There was      mild mitral annular calcification.  7. X-ray right femur dated December 24, 2007.  This was 4 fluoroscopic      intraoperative views demonstrating placement of a lateral plate and      screws for fixation of periprosthetic distal femoral fracture.   CONSULTATIONS:  1. Dr. Erasmo Leventhal, orthopedic surgeon.  2. Dr. Sharyn Lull, cardiologist.  3. Dr. Lewayne Bunting, cardiologist/electrophysiologist.   ADMISSION HISTORY:  As in H&P notes of December 22, 2007, dictated by  Dr. Lucita Ferrara.  However, in brief, this is an 77 year old female, with  known history of coronary artery disease status post previous MI, sick  sinus syndrome status  post permanent pacer in 2007, status post  bilateral shoulder reconstructions at [facility],  status post bilateral knee replacements, osteoporosis, history of breast  cancer status post left mastectomy, status post radiation therapy right  breast, who presents following a fall complicated by a comminuted right  distal femoral periprosthetic fracture, associated with presyncope.  Upon evaluation in the emergency department she was found to have a  broad-complex tachycardia with heart rate of approximately 156.  D-dimer  was elevated at 8.16.  BUN was 38 with a creatinine of 1.59.  She was  admitted for further evaluation, investigation and management.   CLINICAL COURSE:  1. Broad-complex tachycardia.  The patient has a known history of sick      sinus syndrome and is status post permanent pacer.  As described in      admission history above, the patient has had presyncope      characterized by dizziness for approximately 2-3 days prior to      fall.  This raised suspicion of possible pulmonary embolism versus      acute coronary syndrome.  Cardiology  consultation was called, which      was initially provided by Dr. Sharyn Lull and subsequently by Dr. Lewayne Bunting, electrophysiologist.  Rate control was achieved with beta      blocker treatment.  Pacemaker interrogation revealed both atrial      tachycardia with predominantly 1:1 AV conduction rates up to 150      beats per minute as well as atrial flutter with variable AV      conduction.  She also had left bundle-branch block tachycardia with      bundle-branch block aberration.  Dr. Ladona Ridgel recommended adding      Digoxin to the patient's treatment, although he felt that      ultimately the patient might be a consideration for catheter      ablation of atrial arrhythmias, but this would be far down on the      priority list and only recommended if medical therapy was not      controlling her arrhythmias.  Be that as it may, the patient's      arrhythmia proved amenable to control and certainly by December 25, 2007, she had paced complexes only and showed no further evidence      of tachyarrhythmia.   1. Dehydration/acute renal insufficiency.  The patient at the time of      presentation had a BUN of 38, creatinine of 1.59.  Urinalysis was      negative.  She was managed with intravenous fluid hydration.  We      are pleased to note that as of December 26, 2007, BUN was      reasonable at 20 and creatinine had normalized at 1.27.   1. History of coronary artery disease.  The patient has a known      history of coronary artery disease and reportedly, is status post      previous MI.  As mentioned in admission history above, the patient      presented with a tachyarrhythmia raising the suspicion of acute      coronary event.  She underwent chest CT scan which showed no      evidence of pulmonary embolism although it was felt to be a      suboptimal study.  She subsequently underwent ventilation-perfusion      lung scan,  which was also negative for pulmonary embolism.  These       were done against a background of significantly elevated D-dimer      level at 8.16.  In addition, although initial cardiac enzymes      appeared normal, subsequent elevation was noted with troponin      rising from 0.10 to 0.12 to 0.13.  This was felt secondary to      subendocardial/demand ischemia secondary to tachyarrhythmia.  Be      that as it may, 2-D echocardiogram showed normal LV function as      well as absence of regional wall motion abnormalities.   1. Acute blood loss anemia.  The patient was on December 24, 2007,      noted to have a hemoglobin which was significantly low at 8.5,      having dropped down from 12.1 on December 23, 2007.  This was      addressed with transfusion of 2 units of packed red blood cells      resulting in a post transfusion hemoglobin of 11.2 on December 25, 2007.  Hemoglobin has remained normal, ever since.   1. Right distal femoral fracture.  As mentioned in the admission      history above, the patient presents with a right distal femoral      periprosthetic fracture.  Orthopedic consultation was kindly      provided by Dr. Valma Cava, who planned ORIF pending      stabilization of the patient.  For details of subsequent events,      refer to the above.  However, the patient was on December 24, 2007,      considered sufficiently clinically stable to proceed to surgery.      She therefore underwent ORIF on that date, in an uncomplicated      procedure.   DISPOSITION:  This will be detailed in addendum in detail at the  appropriate time by discharging MD.  The patient as of December 26, 2007, was clinically stable.  Anemia had resolved, heart rate was  controlled, telemetric monitoring demonstrated unremarkable paced  complexes. Although she did develop mild CHF secondary to fluid  overload, as evidenced by a bump in her BNP to 209 on December 25, 2007,  this responded to discontinuation of intravenous fluids and  administration of  low-dose diuretics.  It is anticipated that in the  next few days the patient will be sufficiently recovered to be  discharged for rehabilitation.      Isidor Holts, M.D.  Electronically Signed     CO/MEDQ  D:  12/26/2007  T:  12/26/2007  Job:  [id number]   cc:   Massie Maroon, MD  Fax: [phone]   Eduardo Osier. Sharyn Lull, M.D.  Fax: [phone]

## 2011-03-19 NOTE — Consult Note (Signed)
NAME:  Sophia Oliver, Sophia Oliver NO.:  [id number]   MEDICAL RECORD NO.:  [id number]          PATIENT TYPE:  INP   LOCATION:  1409                         FACILITY:  [facility]   PHYSICIAN:  Erasmo Leventhal, M.D.DATE OF BIRTH:  1924-04-25   DATE OF CONSULTATION:  12/22/2007  DATE OF DISCHARGE:  07/05/2007                                 CONSULTATION   HISTORY OF PRESENT ILLNESS:  Sophia Oliver is a very pleasant 77 year old  Caucasian female well known to me from orthopedic followup, is followed  in our office.  She has undergone bilateral total knee replacement by  myself at different times in the past.  Unfortunately, she states she  has been dizzy for the past couple weeks due to medicine changes.  She  fell tonight and sustained a right knee periprosthetic femur fracture.  She was evaluated by Dr. Weldon Oliver in the emergency room and found be  medically unstable.  She is currently being admitted to the medical  service.  I evaluated the patient in the emergency room.  She was  complaining of right knee pain.  For allergies, medications, past  medical history, please see details of admission note.   PHYSICAL EXAMINATION:  GENERAL:  Awake, alert, answers questions  appropriately.  Shaking at times.  Accompanied by her husband.  EXTREMITIES:  Upper extremity showed bilateral decrease range of motion  and strength chronic in nature secondary to bilateral shoulder problems.  No obvious acute problem distally.  Left lower extremity unremarkable.  Right lower extremity knee was flexed, 1+ swelling about the knee.  Compartments soft.  NEUROLOGICAL:  Distal neurovascular examination grossly intact.  SKIN:  Intact.  Overall clinical alignment is satisfactory.   STUDIES:  Plain x-rays reviewed.  Pelvis and hip negative for fracture.   Right knee:  She has a comminuted displaced periprosthetic femur  fracture.   IMPRESSION:  Right knee comminuted periprosthetic femur fracture.   RECOMMENDATIONS:  Evaluated the patient in the emergency room, splinted  her appropriately.  She is much more comfortable now.  She will be  admitted to the medical service for medical stabilization.  When she is  surgically stable she should proceed surgery.  Estimated date will be  Thursday.   RECOMMENDATION:  Recommend an open approach to this, possible revision  of the implant at least at approximately 1-1/2 to 2 hours surgery,  possibly two units of blood loss, possibly more or less.   Will follow with you in the hospital.  Explained risks and benefits of  the surgery to the patient and husband in detail.  The understand and  wish to proceed when stable.           ______________________________  Erasmo Leventhal, M.D.     RAC/MEDQ  D:  12/22/2007  T:  12/23/2007  Job:  [id number]

## 2011-03-19 NOTE — Op Note (Signed)
NAME:  Sophia Oliver, Sophia Oliver                  ACCOUNT NO.:  [id number]   MEDICAL RECORD NO.:  [id number]          PATIENT TYPE:  AMB   LOCATION:  DAY                          FACILITY:  [facility]   PHYSICIAN:  Madlyn Frankel. Charlann Boxer, M.D.  DATE OF BIRTH:  24-Apr-1924   DATE OF PROCEDURE:  08/25/2008  DATE OF DISCHARGE:                               OPERATIVE REPORT   PREOPERATIVE DIAGNOSIS:  Painful retained hardware, right knee, status  post open reduction internal fixation of a distal femur fracture with a  DePuy poly-X  condylar plate.  The specific hardware was an 8.0 mm  locking screw.  The painful medial three threads were prominent, as  digitally palpable on examination.   POSTOPERATIVE DIAGNOSIS:  Painful retained hardware, right knee, status  post open reduction internal fixation of a distal femur fracture with a  DePuy poly-X condylar plate.  The specific hardware was an 8.0 mm  locking screw.  The painful medial three threads were prominent, as  digitally palpable on examination.   PROCEDURE:  1. Removal of right knee 8.0 mm screw that measured 80 mm in length.  2. Replacement of this hardware with a 65 mm x 8.0 mm locking screw.   SURGEON:  Madlyn Frankel. Charlann Boxer, M.D.   ASSISTANT:  None.   ANESTHESIA:  General.   ESTIMATED BLOOD LOSS:  Minimal.   TOURNIQUET TIME:  For 10 minutes at 200 mmHg.   INDICATIONS FOR PROCEDURE:  Sophia Oliver is an 77 year old female known to  me with an open reduction and periprosthetic right distal femur  fracture.  Approximately five months ago she underwent an open reduction  and internal fixation of this distal femur fracture.  The fracture was  noted to heal both clinically and radiographically; however, she had  persistent problems with medial pain.  She was tender just on the medial  femoral condyle in the location of the 8 mm screw that had perforated  the medial cortex, to provide purchase in her osteopenic bone.  This was  done on purpose and intended at  the time of the initial operation to  provide purchase in her bone.  Given the fact that this at this point  continued to be painful and was really the only thing that kept her  awake at night whenever she rolled over on her side and had the medial  side of the knee in contact with the left knee, she wished to have it  removed.  I reviewed the fact that it was too early to remove all the  hardware and a concern for refracture through the subcondylar area, that  instead I could remove the single screw and replace it with a smaller  screw, and remain in place the fixation plate and support of the  fracture.  Given the risks and benefits discussed, a consent was  obtained for this benefit.   DESCRIPTION OF PROCEDURE:  The patient was brought to the operative  theater.  Once adequate anesthesia was established, preoperative  antibiotic with Ancef administered.  The patient's right lower extremity  was scrubbed, prepped  and draped over a non-steroid thigh tourniquet.  The leg was elevated.  The tourniquet was elevated to 200 mmHg.  An  approximately 4 cm incision was made over the previous incision  laterally where the distal portion of the screw was.  Sharp dissection  was carried to the iliotibial band, which was then incised.   I elevated the soft tissues off of the plate and identified the blue 8.0  mm locking screw.  It was removed without difficulty.  I measured on the  back table to confirm it was an 80 mm screw and it was, and dropped down  15 mm to provide the locking benefits of this big 8.0 mm screw but not  have any penetration medially on the cortex.  The 65 mm screw was then  passed and locked down securely to the plate.  At this point I irrigated  the wound, reapproximated the iliotibial band with #1 Vicryl over top of  the plate, reapproximating the subcu with #2-0 Vicryl and used staples  again on the skin.  The skin was cleaned, dried and dressed sterilely  with Adaptic and  a sterile bulky wrap.   She was brought to the recovery room in stable condition.  She tolerated  the procedure well.      Madlyn Frankel Charlann Boxer, M.D.  Electronically Signed     MDO/MEDQ  D:  08/25/2008  T:  08/25/2008  Job:  [id number]

## 2011-03-19 NOTE — Op Note (Signed)
NAME:  Sophia Oliver, Sophia Oliver                  ACCOUNT NO.:  [id number]   MEDICAL RECORD NO.:  [id number]          PATIENT TYPE:  INP   LOCATION:  1409                         FACILITY:  [facility]   PHYSICIAN:  Madlyn Frankel. Charlann Boxer, M.D.  DATE OF BIRTH:  Jun 19, 1924   DATE OF PROCEDURE:  12/24/2007  DATE OF DISCHARGE:                               OPERATIVE REPORT   PREOPERATIVE DIAGNOSIS:  Closed right periprosthetic distal femur  fracture.   POSTOPERATIVE DIAGNOSIS:  Closed right periprosthetic distal femur  fracture.   PROCEDURE:  Open reduction and internal fixation of right distal femur  fracture utilizing a six hole polyaxial DePuy side plate with screws.   SURGEON:  Madlyn Frankel. Charlann Boxer, M.D.   ASSISTANT:  Surgical tech   ANESTHESIA:  General.   BLOOD LOSS:  150 mL.   DRAINS:  None.   COMPLICATIONS:  None.   INDICATIONS FOR PROCEDURE:  [name] is an 77 year old female with a  history of right total knee replacement.  She was in her normal state of  health until she fell on February 18 and was brought to the emergency  room where radiographs revealed a distal femur fracture proximal to a  previously well fixed right total knee replacement.  She was admitted to  the medical service due to a history of coronary events.  Orthopedics  was consulted and Dr. Hayden Rasmussen initially saw her. He was actually  her primary surgeon.  He asked for assistance with definitive measures.  The risks and benefits have been reviewed with the patient and her  family regarding surgical fixation and technique.  The risks, benefits,  postoperative course, and  expectations, consent was obtained.   PROCEDURE IN DETAIL:  The patient was brought to the operative theater.  Once adequate anesthesia and preoperative antibiotics, Ancef, were  administered, the patient was positioned supine on the table to allow  fluoroscopic imaging the lower extremity.  The right lower extremity was  draped out from the perineum down.   I then pre-scrubbed and prepped and  draped the iliac crest down to the mid leg.  Fluoroscopic imaging  identified landmarks and a lateral based incision was made.  The  iliotibial band was then opened and the vastus lateralis musculature  elevated anteriorly above the femur.  The fracture site was identified.  Under fluoroscopic imaging, I determined a six hole plate with  adequately support the distal and proximal segments of the fracture site  with good fixation of both.  The six-hole plate was opened.  The  fracture was reduced and I placed a six hole plate distal in its  appropriate position.  I had the wound opened up so I could visualize  the location of the plate.  I used a King clamp in an effort to hold the  distal fragments together while I placed in screw fixation.  I had the  distal segments clamped with a King clamp and the proximal segment with  a plate holding clamp attached to the shaft the femur.  Once I confirmed  the position in the AP and  lateral planes, I began placing screws.  The  8 locking screw was placed distally.  I did initially did get good  distal purchase.  I did purposely pass it through both cortices due to  the osteoporotic bone.   At this point, I placed screws proximally and removed all clamps.  I  placed five total screws into the plate proximal to the knee and four  screws distally.  All distal screws were locking, two screws proximal  were locking and three were bicortical screws.  Final radiographs were  obtained in the AP and lateral planes and I was satisfied with reduction  and position of the component.  I then irrigated the wound out and  reapproximated the iliotibial band over the top of the plate using  running #1 Vicryl.  There was no significant hemostasis required and no  drain utilized.  The subcu layer was closed with 2-0 Vicryl and staples  on the skin.  The skin was cleaned, dried and dressed sterilely with  some dressing sponges  and Mepilex dressing.  I then wrapped the knee in  an Ace wrap.  The patient was then placed back into a knee immobilizer,  extubated, and brought to the recovery room in stable condition.   Postoperatively, she will non-weight bearing on the right lower  extremity until we get fracture healing in 6-8 weeks.  She will remain  in the knee immobilizer 2-3 weeks prior to any movement.      Madlyn Frankel Charlann Boxer, M.D.  Electronically Signed     MDO/MEDQ  D:  12/24/2007  T:  12/25/2007  Job:  [id number]

## 2011-03-19 NOTE — Consult Note (Signed)
NAME:  Sophia Oliver, Sophia Oliver                  ACCOUNT NO.:  [id number]   MEDICAL RECORD NO.:  [id number]          PATIENT TYPE:  INP   LOCATION:  1409                         FACILITY:  [facility]   PHYSICIAN:  Doylene Canning. Ladona Ridgel, MD    DATE OF BIRTH:  1923-11-23   DATE OF CONSULTATION:  12/24/2007  DATE OF DISCHARGE:                                 CONSULTATION   Electrophysiologic Consultation Note.   INDICATIONS FOR CONSULTATION:  Evaluation of a wide QRS tachycardia in  the setting of syncope.   REQUESTING PHYSICIAN:  Mohan N. Sharyn Lull, M.D.   HISTORY OF PRESENT ILLNESS:  The patient is an 77 year old woman who has  a history of symptomatic tachybrady syndrome in the past who had a  syncopal episode requiring hospitalization.  This unfortunately resulted  in fracture of her right leg.  She was subsequently found to have a wide  QRS tachycardia (left bundle branch block) at between 130-150 beats per  minute.  This resolved with intravenous beta blockers.  She is referred  for additional evaluation.  Over the last several weeks she has had  episodes of dizzy spells.  These have been on and off.  Subsequent  interrogation of her pacemaker has demonstrated both atrial tachycardia  with predominantly 1:1 AV conduction at rates up to 150 beats per minute  as well as atrial flutter with variable AV conduction.  The patient also  was subsequently found to be fairly severely anemic with a hemoglobin of  8.5 and hematocrit of 24.6.  She denies any history of GI bleeding.   ADDITIONAL PAST MEDICAL HISTORY:  1. Notable for a prior shoulder replacement at [facility].  2. She also has a history of chronic renal insufficiency.  3. She has a history of hypertension.   CURRENT MEDICATIONS:  She was recently placed on HCTZ.   FAMILY HISTORY:  Noncontributory at her advanced age.   SOCIAL HISTORY:  The patient is widowed she denies tobacco or ethanol  abuse.   REVIEW OF SYSTEMS:   Notable for fatigue and weakness.  Otherwise all  systems are noted as noted in the HPI, otherwise all negative.   PHYSICAL EXAM:  GENERAL:  Notable for her being a pleasant elderly-  appearing woman in no acute distress.  VITAL SIGNS:  Her blood pressure 105/70, pulse 65 and regular,  respirations were 18.  HEENT EXAM:  Normocephalic and atraumatic.  Pupils are equal and round.  Oropharynx is moist.  The sclerae were anicteric.  NECK:  Revealed no jugular distention.  There was no thyromegaly.  Trachea was midline.  LUNGS:  Clear bilaterally to auscultation.  No wheezes, rales, or  rhonchi are present.  CHEST:  Her right-sided pacemaker was healing nicely.  CARDIOVASCULAR EXAM:  Had a regular rate and rhythm.  Normal S1 and S2.  There are no murmurs, rubs, or gallops.  The PMI was not enlarged nor  laterally displaced.  ABDOMINAL EXAM:  Soft, nontender, nondistended.  There was no organomegaly.  Bowel sounds are present.  There was no  rebound or guarding.  EXTREMITIES:  Demonstrated no cyanosis, clubbing, or edema.  She has a  soft cast on her right leg.  NEUROLOGIC EXAM:  Alert and oriented x3.  Cranial nerves were intact.  Strength was 5/5 and symmetric.   EKG demonstrates sinus rhythm.   IMPRESSION:  1. Syncope.  2. Left bundle branch block tachycardia secondary to atrial      tachycardia and atrial flutter with variable AV conduction but with      bundle branch block aberration.  3. Severe anemia.  4. History of bradycardia status post pacemaker insertion.  5. Hypertension.   DISCUSSION:  I have discussed the treatment options with Ms. Parillo and  her son.  I have recommended that we continue her beta blocker and add  digoxin to try to control her atrial arrhythmias.  I would also  recommend transfusion and aggressive evaluation of her anemia.  She will  need her hemoglobin increased prior to any leg surgery.  Her pacemaker  function is normal.  Ultimately the patient  might be a consideration for  catheter ablation of her atrial arrhythmias, though this would be far  down on the priority list, and only recommended if medical therapy was  not controlling her arrhythmias.      Doylene Canning. Ladona Ridgel, MD  Electronically Signed     Doylene Canning. Ladona Ridgel, MD  Electronically Signed    GWT/MEDQ  D:  12/24/2007  T:  12/24/2007  Job:  [id number]   cc:   Duke Salvia, MD, [facility]  Massie Maroon, MD

## 2011-03-22 NOTE — H&P (Signed)
NAME:  Sophia, Oliver NO.:  [id number]   MEDICAL RECORD NO.:  [id number]          PATIENT TYPE:  EMS   LOCATION:  MAJO                         FACILITY:  [facility]   PHYSICIAN:  Hettie Holstein, D.O.    DATE OF BIRTH:  1924/04/30   DATE OF ADMISSION:  10/17/2005  DATE OF DISCHARGE:                                HISTORY & PHYSICAL   <<<<<<The Phone in [facility] ER doctors dictation area needs to be replaced or  serviced as this dictation among others is inaudible and cannot be  transcribed>>>>>>>>>>>   PRIMARY CARE PHYSICIAN:  Janae Bridgeman. Lendell Caprice, M.D.   CARDIOLOGISTEduardo Osier Sharyn Lull, M.D.   CHIEF COMPLAINT:  Palpitations and dizziness.   HISTORY OF PRESENT ILLNESS:  [name] is a pleasant, well-functioning  77 year old Caucasian female who lives at home with her husband and is quite  active, who walks her dog regularly.  Just recently had undergone a total  hip replacement surgery over at [facility], where she had a  shoulder replaced.  She had problems initially at that time with  palpitations prior to the first initially scheduled surgery on July 24, 2005, so this was postponed.  She went again on August 23, 2005, to have  this surgery performed and subsequently was sent to the emergency room  because of palpitations during that hospital course, and subsequently ended  up in the intensive care unit for six days.  I have requested her medical  records from [facility], and have also contacted Dr. Sharyn Lull, who has seen Mrs.  Metzer about two years ago, when she had testing, and it was felt that maybe  she was headed for a pacemaker at some point in time, per Dr. Annitta Jersey  assessment.  I called Dr. Sharyn Lull and faxed the electrocardiogram to his  office this afternoon, and await a consultation and recommendations.   [name] stated that she had problems on the day before yesterday, that  did resolve; however, this morning she woke up and felt quite  terrible and  dizzy.  She felt that her heart was beating against her chest and has felt  quite uncomfortable in general.   PAST MEDICAL/SURGICAL HISTORY:  1.  Significant for a recent total hip replacement at [facility].  The records are      being requested.  2.  History of SVT and tachycardia/bradycardia syndrome.  3.  Coronary artery disease by electrocardiogram, indication of in the      anterior leads, though a Persantine several years ago revealing an      ejection fraction of 62%.  4.  History of breast cancer with right radiotherapy about nine years ago.      Did have a left mastectomy as well, with also breast cancer.  5.  Hysterectomy.  6.  Left rotator cuff repair.  7.  Bilateral total knee replacements.   MEDICATIONS:  1.  Hydrochlorothiazide 12.5 mg daily.  2.  Lisinopril 40 mg daily.  3.  Ultram 50 mg q.a.m.  4.  Aspirin 81 mg daily.  ALLERGIES:  She reports CODEINE.  She had some intolerance to muscle  relaxants.   SOCIAL HISTORY:  She is married.  She has one son.     She reports only occasional alcohol.  She denies tobacco.  She is retired  Wellsite geologist.   FAMILY HISTORY:  Mother died in her 42's with heart problems.  Father died  in his 65's with kidney failure.  She has siblings with diabetes.   REVIEW OF SYSTEMS:  She was in her usual state of health with the exception  of the throbbing in the right shoulder.  Otherwise denies any fever, chills  or sweats.   PHYSICAL EXAMINATION:  GENERAL:  She is alert, pleasant, completely oriented  but a bit uncomfortable.  HEENT:  Head normocephalic and atraumatic.  Extraocular muscles are intact.  NECK:  Supple, nontender.  No palpable thyromegaly or mass.  CARDIOVASCULAR:  Normal S1 and S2.  There is an irregular regular beat.  An  S4 on auscultation.  LUNGS:  She does normally exhibit a normal effort.  Her lungs are clear.  There is dullness to percussion.  ABDOMEN:  Soft, nontender.  No suprapubic  costovertebral angle tenderness.  Bowel sounds are normoactive.  EXTREMITIES:  Lower extremities exhibit no edema.  Peripheral pulses are  symmetrically palpable bilaterally.  NEUROLOGIC:  Reveals no focal neurological deficits at this time. Affect is  stable.  She is oriented x3.   LABORATORY DATA:  Sodium 138, potassium ___________.  D-dimer 1.19, AST 106.  Point of care markers ___________ negative.   A chest x-ray was just done a.c. but revealed a possible right lower lobe  mass/density.  With her history of breast implants, will obtain a PA and  lateral chest to further clarify.  She will undergo a V/Q scan due to the  elevated D-dimer of 1.19.   ASSESSMENT:  1.  Palpitations.  2.  Dizziness.  3.  Acute renal failure.  4.  Coronary artery disease by history.  5.  Chest x-ray showing a right lower lobe mass.  We will need to further      clarify with a PA and lateral, since the patient does have breast      implants, which may be contributing to this.  6.  History of supraventricular tachycardia in the past.  7.  Elevated D-dimer.   PLAN:  At this time we are going to pursue a V/Q scan.  Will empirically  place her on low-molecular weight heparin.  Dr. Sharyn Lull has been consulted  and currently __________ her with coronary artery disease.  Will rule out  a pulmonary embolus.  We are going to obtain further records from [facility].  I have requested that these be faxed with a requisition.  I  will check her electrolytes and a TSH.  Await further recommendations from  her cardiologist.      Hettie Holstein, D.O.  Electronically Signed     ESS/MEDQ  D:  10/17/2005  T:  10/18/2005  Job:  [id number]   cc:   Janae Bridgeman. Eloise Harman., M.D.  Fax: [phone]   Eduardo Osier. Sharyn Lull, M.D.  Fax: [phone]

## 2011-03-22 NOTE — Discharge Summary (Signed)
NAME:  Sophia Oliver, Sophia Oliver                  ACCOUNT NO.:  [id number]   MEDICAL RECORD NO.:  [id number]          PATIENT TYPE:  INP   LOCATION:  3702                         FACILITY:  [facility]   PHYSICIAN:  Eduardo Osier. Sharyn Lull, M.D. DATE OF BIRTH:  Jun 29, 1924   DATE OF ADMISSION:  08/01/2004  DATE OF DISCHARGE:  08/03/2004                                 DISCHARGE SUMMARY   ADMISSION DIAGNOSES:  Atypical chest pain.   PALPITATIONS:  Rule out myocardial infarction, rule out cardiac arrhythmias,  hypertension, history of carcinoma of breast, degenerative joint disease,  history of silent anteroseptal wall myocardial infarction.   DISCHARGE DIAGNOSES:  Status post atypical chest pain, negative Persantine  Cardiolite with some evidence of anterior apical wall scar with no evidence  of ischemia, status post nonsustained supraventricular tachycardia, sinus  bradycardia, probable tachybrady syndrome, hypertension, history of silent  anteroseptal wall myocardial infarction, history of carcinoma of breast,  degenerative joint disease.   DISCHARGE HOME MEDICATIONS:  1.  Toprol-XL 25 mg 1/2 tablet daily.  2.  Baby aspirin 81 mg 1 tablet daily.  3.  Advil 200 mg 1 tablet twice daily with food for chest wall pain.  4.  Nexium 40 mg 1 capsule daily before breakfast.  5.  Altace 2.5 mg 1 capsule daily.  6.  Nitrostat 0.4 mg sublingual, use as directed.   ACTIVITY:  As tolerated.   DIET:  Low-cholesterol.   FOLLOW UP:  1.  With me in two weeks.  2.  With PMD and surgery as scheduled for possible revision of breast      transplant.   CONDITION AT DISCHARGE:  Stable.   BRIEF HISTORY AND HOSPITAL COURSE:  Sophia Oliver is a 77 year old white female  with past medical history significant for hypertension, degenerative joint  disease, history of CA of breast, status post bilateral breast implants,  questionable history of silent anteroseptal wall MI, came to the ER  complaining of retrosternal chest pain off  and on for the last few weeks.  Chest pain increased with movement and local pressure.  She states she felt  severe chest pain on Monday associated with palpitation and dizziness, took  baby aspirin and went to bed, and did not seek any medical attention.  Also  complains of throbbing pain in the left arm.  States had left shoulder in  the past.  Denies any exertional chest pain, although her activity is  limited.  Denies any syncopal episodes.  Denies, PND, orthopnea, and leg  swelling.  The patient was noted to have a few beats of nonsustained SVT on  the monitor.  An EKG showed junctional rhythm with old anteroseptal wall MI.   PAST MEDICAL HISTORY:  As above.   PAST SURGICAL HISTORY:  Had mastectomy with implants for CA of breast many  years ago, had bilateral total knee replacement eight years ago, had left  rotator cuff surgery in the past.   ALLERGIES:  No known drug allergies.   MEDICATION AT HOME:  1.  Toprol-XL 25 mg 1/2 tablet daily.  2.  Mobic 15 mg  p.o. daily.  3.  Aspirin as needed occasionally.   SOCIAL HISTORY:  She is married, has one son, smoked 1/2 pack per day for  one year, quit 30+ years ago, drinks wine occasionally.   FAMILY HISTORY:  Father died of old age.  Mother died of old age in her 86s.  Two brothers died of cancer in their 33s.   PHYSICAL EXAMINATION:  GENERAL:  She is alert, awake, oriented x3, in no  acute distress.  VITAL SIGNS:  Blood pressure 189/80, pulse 61.  HEENT:  Conjunctivae were pink.  NECK:  Supple, no JVD, no bruit.  LUNGS:  Clear to auscultation without rhonchi or rales.  CARDIOVASCULAR EXAM:  S1 and S2 normal.  There was a soft S4 gallop.  There  was no S3 gallop, murmur, or rub.  CHEST WALL:  Tenderness at the site of breast implant was positive, but  there was no erythema.  ABDOMEN:  Soft, bowel sounds present, nontender.  EXTREMITIES:  No clubbing, cyanosis, or edema.   An EKG showed junction rhythm with LVH with probable  old anteroseptal wall  MI, age undetermined.   LABORATORY DATA:  Cholesterol 142, HDL 69, LDL 61.  TSH was 1.99.  Two sets  of cardiac enzymes:  CK was 43, MB 0.7, second set CK 53, MB 0.8.  Troponin-  I was 0.05 and 0.04.  Sodium was 135, potassium 3.7, chloride 105, glucose  118, BUN 22, creatinine 1.3.  Repeat fasting blood sugar was 99.   BRIEF HOSPITAL COURSE:  The patient was admitted to the telemetry unit.  An  MI was ruled out by serial enzymes and EKGs.  The patient did not have any  further episodes of palpitations during the hospital stay.  The patient  underwent Persantine Cardiolite yesterday  which showed no stress-induced  ischemia.  There was a fixed defect in the anterior apex.  Ejection fraction  of 62%.  The patient had an episode of bradycardia but remained  asymptomatic.  The patient is on already low dose of beta blockers.  The  patient did not have any episodes of SVT or any episodes of VT during the  hospital stay.   The patient will be discharged on the above medications and will be followed  up in my office in one week.  If the patient gets frequent episodes of  palpitation, will get Event monitor as an outpatient.  If she has recurrent  episodes of SVT, then may require permanent pacemaker in the future.       MNH/MEDQ  D:  08/03/2004  T:  08/04/2004  Job:  [id number]

## 2011-03-22 NOTE — Consult Note (Signed)
NAME:  Sophia Oliver, Sophia Oliver NO.:  [id number]   MEDICAL RECORD NO.:  [id number]          PATIENT TYPE:  EMS   LOCATION:  MAJO                         FACILITY:  [facility]   PHYSICIAN:  Leonides Grills, M.D.     DATE OF BIRTH:  1924/06/30   DATE OF CONSULTATION:  04/06/2006  DATE OF DISCHARGE:                                   CONSULTATION   CHIEF COMPLAINT:  Right knee pain since yesterday.   HISTORY:  This is a 77 year old female who slipped and fell yesterday and  had increased right knee pain but was able to walk.  Out of concern and  wanted to make sure that there was nothing broken came to [facility] ER where x-  rays were obtained and I wall called for further evaluation and treatment.  She had a right total knee arthroplasty by Dr. Thomasena Edis and has had an  uncomplicated course.   PHYSICAL EXAMINATION:  She has slight warmth to the right knee.  No  significant effusion.  Sensation intact to light touch over L4-S1.  Palpable  dorsalis pedis pulse.  Quadriceps and patellar tendon are grossly intact.  She has peripatellar tenderness.   X-rays show no evidence of fracture, well-positioned prosthesis, no  significant joint effusion.   IMPRESSION:  Right knee contusion versus strain.   PLAN:  I explained to Mr. and Mr. Debruler at this point will place her in a  knee immobilizer, weightbearing as tolerated which she tolerates that she  has tried.  She is to follow up with Dr. Thomasena Edis tomorrow.  Gave her a  prescription for Vicodin, Robaxin, and ice to be applied p.r.n.  If she had  any questions or concerns she is to call us.      Leonides Grills, M.D.  Electronically Signed     PB/MEDQ  D:  04/06/2006  T:  04/07/2006  Job:  [id number]

## 2011-03-22 NOTE — Op Note (Signed)
NAME:  Sophia Oliver, CERINO NO.:  [id number]   MEDICAL RECORD NO.:  [id number]          PATIENT TYPE:  INP   LOCATION:  4703                         FACILITY:  [facility]   PHYSICIAN:  [facility] Salvia, M.D.  DATE OF BIRTH:  April 11, 1924   DATE OF PROCEDURE:  10/22/2005  DATE OF DISCHARGE:                                 OPERATIVE REPORT   PREOPERATIVE DIAGNOSIS:  Sinus node dysfunction with atrial tachycardia and  atrial fibrillation.   POSTOPERATIVE DIAGNOSIS:  Sinus node dysfunction with atrial tachycardia and  atrial fibrillation.   PROCEDURE:  Dual chamber pacemaker implantation.   Following the obtaining of informed consent, the patient was brought to the  cardiac catheterization lab and placed on the fluoroscopic table in the  supine position.  After routine prep and drape of the right upper chest, the  right side having been chosen because the patient is status post left  mastectomy and with a recent right shoulder surgery, it was the immobile  shoulder, lidocaine was infiltrated in the prepectoral subclavicular region.  Incision was made and carried down to the layer of the prepectoral fascia  with electrocautery and sharp dissection.  A pocket was formed similarly.  Hemostasis was obtained.   Thereafter, attention was turned to gaining access to the extrathoracic  right subclavian vein which was accomplished without difficulty and without  the aspiration of air or puncture of the artery.  Two separate venipunctures  were accomplished.  Guidewires were placed and retained and sequentially 7  French tear away introducer sheaths were placed which were passed in  Medtronic 5076 52 cm active fixation ventricular leads, serial number  [id number] and a Medtronic 5076 45 cm active fixation atrial lead, serial  number [id number].  The ventricular lead was marked with a tie.  Under  fluoroscopic guidance, the leads were manipulated to the right ventricular  apex and the  right atrial appendage, respectively with bipolar R-wave was  14.8 mV with pacing impedance of 1030 ohms and threshold of 1.3 volt at 0.5  msec, current threshold was 1.8 MA. There was no diaphragmatic pacing at 10  volts.   These were obtained immediately after screw deployment.   The bipolar P-wave was 11.4 mV with pacing impedance of 748 ohms at a  threshold of 1.2 volts at 0.5 msec and currented threshold of 1.9 MA.  With  these acceptable parameters recorded, the leads were secured to the  prepectoral fascia and then attached to a Guidant Insignia Plus model 1297  pulse generator, serial number [id number].  Ventricular pacing and then atrial  pacing were identified.  Hemostasis was again obtained.  Pocket was  copiously irrigated with antibiotic-containing saline solution.  The leads  in the pulse generator were placed in the pocket and secured to the  prepectoral fascia. The wound was then closed in three layers in the normal  fashion.  The wound was  washed, dried and benzoin and Steri-Strip dressing was applied  Sponge,  needle and instrument counts were correct at the end of the procedure  according to the staff.  The patient tolerated the procedure without  apparent complication.           ______________________________  [facility] Salvia, M.D.     SCK/MEDQ  D:  10/22/2005  T:  10/24/2005  Job:  [id number]   cc:   Eduardo Osier. Sharyn Lull, M.D.  Fax: [phone]   [facility]   [facility] [place] [facility]

## 2011-03-22 NOTE — Discharge Summary (Signed)
Sophia Oliver, Sophia Oliver                  ACCOUNT NO.:  [id number]   MEDICAL RECORD NO.:  [id number]          PATIENT TYPE:  INP   LOCATION:  4703                         FACILITY:  [facility]   PHYSICIAN:  Danae Chen, M.D.DATE OF BIRTH:  Mar 11, 1924   DATE OF ADMISSION:  10/17/2005  DATE OF DISCHARGE:  10/23/2005                                 DISCHARGE SUMMARY   PRIMARY CARE PHYSICIAN:  Dr. Lendell Caprice.   CARDIOLOGISTEduardo Osier Sharyn Lull, M.D.   DISCHARGE DIAGNOSES:  1.  Supraventricular tachycardia.  2.  Tachybrady syndrome.  3.  Acute renal insufficiency.  4.  History of coronary artery disease.   CONSULTATIONS:  Cardiology.   PROCEDURE:  Dual-chamber pacer implant and 2-D echocardiogram.   DISCHARGE MEDICATIONS:  1.  Metoprolol 50 mg p.o. b.i.d.  2.  Amiodarone 200 mg two tablets daily between October 23, 2005 and      November 06, 2005 and then 200 mg daily starting on November 07, 2005.  3.  Apresoline 10 mg p.o. t.i.d.  4.  Xanax 0.25 mg q.8h. p.r.n. as needed.   HOSPITAL COURSE:  The patient is a pleasant 77 year old female who is  admitted initially for complaints of palpitations and dizziness.  The  patient had recently undergone a hip replacement and shoulder replacement  surgery at [facility].  Initial evaluation showed that the patient had a regular  heart beat with tachycardia as well as bradycardia.  A cardiology consult  was obtained.  Cardiology evaluation revealed a need for possible  implantation of a pacemaker.  The patient was given information to decide on  her desire to have a pacemaker implanted.   In regard to her renal insufficiency, this was thought to be prerenal  azotemia.  This continued to improve.  Her hypertension was fairly well-  controlled.  Her diuretic and ACE inhibitor were placed on hold initially  secondary to her renal insufficiency.  She was treated with hydralazine for  control.  Her cardiac enzymes were negative.  The patient, after  reviewing  information, did decide to have a pacemaker implanted.  She was started on  amiodarone.  Her condition was stable.  Dr. Sharyn Lull, her cardiologist, made  arrangements for her pacemaker insertion.  This was done on October 22, 2005, with input from electrophysiology service as well.  The patient  received a DDD implant, right-sided.  She continued her amiodarone and was  doing well at the time of discharge.  At the time of discharge, the patient  was hemodynamically stable, lungs were clear, heart rate was regular with no  murmurs.  Pacemaker site was without complications.   FOLLOW UP:  The patient was to follow up in two weeks to follow her TSH and  LFTs while on her amiodarone.  Instructions were given to the patient.   CONDITION ON DISCHARGE:  The patient was stable at the time of discharge.      Danae Chen, M.D.     RLK/MEDQ  D:  12/30/2005  T:  12/31/2005  Job:  [id number]

## 2011-03-22 NOTE — Assessment & Plan Note (Signed)
[facility]                         ELECTROPHYSIOLOGY OFFICE NOTE   Sophia Oliver, Sophia Oliver                         MRN:          [id number]  DATE:10/09/2006                            DOB:          July 01, 1924    Sophia Oliver was seen today in the clinic on October 09, 2006 for followup  of her Guidant model #1297 Kipp Brood. Date of implant was October 22, 2005 for sinus node dysfunction.   On interrogation of her device today, her battery voltage is at  beginning of life. P waves measured 0.8 millivolts with an atrial  capture threshold of 0.8 volts at 0.4 milliseconds and an atrial lead  impedance of 500 ohms. R waves measured 10.6 millivolts with a  ventricular capture threshold of 1.2 volts at 0.6 msec and a ventricular  lead impedance of 540 ohms. No changes were made in her parameters  today. She will start telephone checks on an every 3 month basis with  [facility] with a return office visit in 1 year's time.      Altha Harm, LPN  Electronically Signed      [facility] Salvia, MD, [facility]  Electronically Signed   PO/MedQ  DD: 10/09/2006  DT: 10/09/2006  Job #: [id number]

## 2011-03-26 ENCOUNTER — Other Ambulatory Visit: Payer: Self-pay | Admitting: Unknown Physician Specialty

## 2011-03-26 DIAGNOSIS — M25552 Pain in left hip: Secondary | ICD-10-CM

## 2011-03-26 DIAGNOSIS — M545 Low back pain: Secondary | ICD-10-CM

## 2011-03-28 ENCOUNTER — Ambulatory Visit
Admission: RE | Admit: 2011-03-28 | Discharge: 2011-03-28 | Disposition: A | Discharge status: Home / Self Care | Payer: Medicare Other | Source: Ambulatory Visit | Attending: Unknown Physician Specialty | Admitting: Unknown Physician Specialty

## 2011-03-28 DIAGNOSIS — M545 Low back pain: Secondary | ICD-10-CM

## 2011-03-28 DIAGNOSIS — M25552 Pain in left hip: Secondary | ICD-10-CM

## 2011-04-05 ENCOUNTER — Encounter: Payer: Self-pay | Admitting: Internal Medicine

## 2011-04-23 ENCOUNTER — Ambulatory Visit (INDEPENDENT_AMBULATORY_CARE_PROVIDER_SITE_OTHER): Payer: Medicare Other | Admitting: Internal Medicine

## 2011-04-23 ENCOUNTER — Encounter: Payer: Self-pay | Admitting: Internal Medicine

## 2011-04-23 DIAGNOSIS — Z950 Presence of cardiac pacemaker: Secondary | ICD-10-CM

## 2011-04-23 DIAGNOSIS — T82190A Other mechanical complication of cardiac electrode, initial encounter: Secondary | ICD-10-CM

## 2011-04-23 DIAGNOSIS — T82110A Breakdown (mechanical) of cardiac electrode, initial encounter: Secondary | ICD-10-CM | POA: Insufficient documentation

## 2011-04-23 DIAGNOSIS — R55 Syncope and collapse: Secondary | ICD-10-CM

## 2011-04-23 NOTE — Assessment & Plan Note (Signed)
As noted the device was reprogrammed to a lower rate limit of 40.

## 2011-04-23 NOTE — Progress Notes (Signed)
  HPI  Sophia Oliver is a 77 y.o. female Is seen in followup for a pacemaker implanted for sinus node dysfunction and syncope. At her last visit we noted increasing right ventricular impedances and thresholds. We chose to watch it was an increased AV delay. The thresholds and continued to escalate and the impedance has now for face with unipolar pacing. Review of the histograms demonstrate about 30% ventricular pacing at the lower rate limit even with an AV delay of 300/240. I have chosen to decrease the pacing rate of 40 to see what her ventricular pacing burden is.  Past Medical History  Diagnosis Date  . Syncope   . Sinus node dysfunction   . S/P cardiac pacemaker procedure     Guidant  . Wide-complex tachycardia   . Breast cancer   . Cholelithiasis   . Glucose intolerance (impaired glucose tolerance)   . DJD (degenerative joint disease)   . Anxiety     Past Surgical History  Procedure Date  . S/p pacemaker   . Replacement total knee bilateral   . Total shoulder replacement   . Total hip arthroplasty   . Femur fracture surgery   . Hysterectomy, unspecified area   . Masterectomy     Current Outpatient Prescriptions  Medication Sig Dispense Refill  . acetaminophen (TYLENOL) 500 MG tablet Take 500 mg by mouth every 6 (six) hours as needed.        . ALPRAZolam (XANAX) 0.25 MG tablet Take 0.25 mg by mouth 2 (two) times daily.        . Cholecalciferol (VITAMIN D) 1000 UNITS capsule Take 1,000 Units by mouth daily.        . metoprolol (LOPRESSOR) 50 MG tablet Take 100 mg by mouth daily.        . Omega-3 Fatty Acids (FISH OIL MAXIMUM STRENGTH) 1200 MG CAPS Take 1 capsule by mouth daily.        [name] spironolactone (ALDACTONE) 25 MG tablet Take 2 tablets by mouth Daily.      [name] warfarin (COUMADIN) 2 MG tablet Take 2 mg by mouth daily. UAD by anticoagulation clinic       . DISCONTD: acetaminophen (TYLENOL) 325 MG tablet Take 650 mg by mouth every 6 (six) hours as needed.        [name] DISCONTD:  pantoprazole (PROTONIX) 40 MG tablet Take 40 mg by mouth daily.          Allergies  Allergen Reactions  . Codeine     Review of Systems negative except from HPI and PMH  Physical Exam Well developed and well nourished in no acute distress HENT normal   Clear to ausculation Regular rate and rhythm, no murmurs gallops or rub Soft with active bowel sounds No clubbing cyanosis and edema Alert and oriented, grossly normal motor and sensory function Skin Warm and Dry      Assessment and  Plan

## 2011-04-23 NOTE — Patient Instructions (Signed)
Your physician recommends that you schedule a follow-up appointment in: 3-4 weeks. Your physician recommends that you continue on your current medications as directed. Please refer to the Current Medication list given to you today. 

## 2011-04-23 NOTE — Assessment & Plan Note (Signed)
No recurrent syncope 

## 2011-04-23 NOTE — Assessment & Plan Note (Signed)
The patient had progressive pacemaker lead failure other right ventricular lead. Her primary indication however with sinus node dysfunction. We have reprogrammed her pacemaker to try to limit any evidence of right ventricular pacing to see if the situation mandates device revision.

## 2011-05-24 ENCOUNTER — Encounter: Payer: Self-pay | Admitting: Internal Medicine

## 2011-05-24 ENCOUNTER — Ambulatory Visit (INDEPENDENT_AMBULATORY_CARE_PROVIDER_SITE_OTHER): Payer: Medicare Other | Admitting: Internal Medicine

## 2011-05-24 ENCOUNTER — Encounter: Payer: Self-pay | Admitting: *Deleted

## 2011-05-24 DIAGNOSIS — T82190A Other mechanical complication of cardiac electrode, initial encounter: Secondary | ICD-10-CM

## 2011-05-24 DIAGNOSIS — Z950 Presence of cardiac pacemaker: Secondary | ICD-10-CM

## 2011-05-24 DIAGNOSIS — R55 Syncope and collapse: Secondary | ICD-10-CM

## 2011-05-24 DIAGNOSIS — I495 Sick sinus syndrome: Secondary | ICD-10-CM

## 2011-05-24 DIAGNOSIS — T82110A Breakdown (mechanical) of cardiac electrode, initial encounter: Secondary | ICD-10-CM

## 2011-05-24 LAB — PACEMAKER DEVICE OBSERVATION
AL IMPEDENCE PM: 490 Ohm
BAMS-0001: 170 {beats}/min
BAMS-0002: 8 ms
BAMS-0003: 70 {beats}/min
DEVICE MODEL PM: 313770
RV LEAD AMPLITUDE: 12 mv

## 2011-05-24 NOTE — Assessment & Plan Note (Signed)
Stable

## 2011-05-24 NOTE — Progress Notes (Signed)
  HPI  Sophia Oliver is a 77 y.o. female seen in followup for a pacemaker implanted for sinus node dysfunction and syncope. At her last visits we noted increasing right ventricular impedances and thresholds. We Have chosen to watch it was an increased AV delay. The thresholds and continued to escalate and the impedance has now for face with unipolar pacing. Despite low range the patient rate of 40, she continues to pace about 15% of the time in the ventricle.    Past Medical History  Diagnosis Date  . Syncope   . Sinus node dysfunction   . S/P cardiac pacemaker procedure     Guidant  . Wide-complex tachycardia   . Breast cancer   . Cholelithiasis   . Glucose intolerance (impaired glucose tolerance)   . DJD (degenerative joint disease)   . Anxiety     Past Surgical History  Procedure Date  . S/p pacemaker   . Replacement total knee bilateral   . Total shoulder replacement   . Total hip arthroplasty   . Femur fracture surgery   . Hysterectomy, unspecified area   . Masterectomy     Current Outpatient Prescriptions  Medication Sig Dispense Refill  . ALPRAZolam (XANAX) 0.25 MG tablet Take 0.25 mg by mouth 2 (two) times daily.        . Cholecalciferol (VITAMIN D) 1000 UNITS capsule Take 1,000 Units by mouth daily.        . metoprolol (LOPRESSOR) 50 MG tablet Take 100 mg by mouth daily.        . Omega-3 Fatty Acids (FISH OIL MAXIMUM STRENGTH) 1200 MG CAPS Take 1 capsule by mouth daily.        . pantoprazole (PROTONIX) 40 MG tablet       . spironolactone (ALDACTONE) 25 MG tablet Take 2 tablets by mouth Daily.        Allergies  Allergen Reactions  . Codeine     Review of Systems negative except from HPI and PMH  Physical Exam Well developed and well nourished in no acute distress HENT normal E scleral and icterus clear Right-sided pacemaker implantation with most of the superficial veins on the left side of her chest; side of mastectomy  JVP flat; carotids brisk and  full Clear to ausculation Regular rate and rhythm, no murmurs gallops or rub Soft with active bowel sounds No clubbing cyanosis and edema Alert and oriented, grossly normal motor and sensory function Skin Warm and Dry  ECG Sinus rhythm at 49 with left bundle branch block  Assessment and  Plan

## 2011-05-24 NOTE — Assessment & Plan Note (Signed)
The patient's device was interrogated.  The information was reviewed. No changes were made in the programming.    

## 2011-05-24 NOTE — Patient Instructions (Signed)
Your physician has recommended that you have a pacemaker revision.  Please see the instruction sheet given to you today for more information.

## 2011-05-24 NOTE — Assessment & Plan Note (Signed)
Pacemaker lead failure-ventricular will require revision. She is ventricularly pacing 15% of the time at a lower rate of 40  We have reviewed the potential benefits and risks. We will start with a venogram. In the event that it is patent we will go through the right subclavian vein. In the event that it is occluded, will go to the right internal jugular. She is status post left-sided mastectomy

## 2011-05-29 ENCOUNTER — Telehealth: Payer: Self-pay | Admitting: Internal Medicine

## 2011-05-29 DIAGNOSIS — I4891 Unspecified atrial fibrillation: Secondary | ICD-10-CM

## 2011-05-29 NOTE — Telephone Encounter (Signed)
I spoke with the patient. She states she is on Pradaxa 75mg  bid. She is not on aldactone. I have sent a text message to Dr. Graciela Husbands and I will call the patient back regarding her pradaxa and holding this prior to PPM revision and atrial lead implantation.

## 2011-05-29 NOTE — Telephone Encounter (Signed)
Per Dr. Graciela Husbands, hold Pradaxa for 48 hours prior to procedure. The patient is aware and verbalizes understanding.

## 2011-05-29 NOTE — Telephone Encounter (Signed)
Does she need to d/c her Pradaxo before her procedure on 8-2.  Please call her with this information.

## 2011-06-04 ENCOUNTER — Other Ambulatory Visit (INDEPENDENT_AMBULATORY_CARE_PROVIDER_SITE_OTHER): Payer: Medicare Other | Admitting: *Deleted

## 2011-06-04 DIAGNOSIS — T82110A Breakdown (mechanical) of cardiac electrode, initial encounter: Secondary | ICD-10-CM

## 2011-06-04 DIAGNOSIS — Z950 Presence of cardiac pacemaker: Secondary | ICD-10-CM

## 2011-06-04 DIAGNOSIS — T82190A Other mechanical complication of cardiac electrode, initial encounter: Secondary | ICD-10-CM

## 2011-06-04 DIAGNOSIS — R55 Syncope and collapse: Secondary | ICD-10-CM

## 2011-06-04 DIAGNOSIS — I495 Sick sinus syndrome: Secondary | ICD-10-CM

## 2011-06-04 LAB — BASIC METABOLIC PANEL
BUN: 30 mg/dL — ABNORMAL HIGH (ref 6–23)
GFR: 28.62 mL/min — ABNORMAL LOW (ref >60.00)
Potassium: 4.8 mEq/L (ref 3.5–5.1)

## 2011-06-04 LAB — PROTIME-INR
INR: 1.2 ratio — ABNORMAL HIGH (ref 0.8–1.0)
Prothrombin Time: 13.6 s — ABNORMAL HIGH (ref 10.2–12.4)

## 2011-06-05 LAB — CBC WITH DIFFERENTIAL/PLATELET
Eosinophils Relative: 0.9 % (ref 0.0–5.0)
HCT: 36.7 % (ref 36.0–46.0)
Lymphs Abs: 1.3 10*3/uL (ref 0.7–4.0)
Monocytes Relative: 7.6 % (ref 3.0–12.0)
Neutrophils Relative %: 73.4 % (ref 43.0–77.0)
Platelets: 301 10*3/uL (ref 150.0–400.0)
WBC: 7.2 10*3/uL (ref 4.5–10.5)

## 2011-06-06 ENCOUNTER — Ambulatory Visit (HOSPITAL_COMMUNITY): Payer: Medicare Other

## 2011-06-06 ENCOUNTER — Ambulatory Visit (HOSPITAL_COMMUNITY)
Admission: RE | Admit: 2011-06-06 | Discharge: 2011-06-06 | Disposition: A | Discharge status: Home / Self Care | Payer: Medicare Other | Source: Ambulatory Visit | Attending: Internal Medicine | Admitting: Internal Medicine

## 2011-06-06 DIAGNOSIS — Z538 Procedure and treatment not carried out for other reasons: Secondary | ICD-10-CM | POA: Insufficient documentation

## 2011-06-06 DIAGNOSIS — I495 Sick sinus syndrome: Secondary | ICD-10-CM | POA: Insufficient documentation

## 2011-06-06 DIAGNOSIS — Z950 Presence of cardiac pacemaker: Secondary | ICD-10-CM | POA: Insufficient documentation

## 2011-06-06 LAB — SURGICAL PCR SCREEN
MRSA, PCR: NEGATIVE
Staphylococcus aureus: NEGATIVE

## 2011-06-06 LAB — BASIC METABOLIC PANEL
CO2: 25 mEq/L (ref 19–32)
Calcium: 9.9 mg/dL (ref 8.4–10.5)
Chloride: 105 mEq/L (ref 96–112)
Creatinine, Ser: 1.52 mg/dL — ABNORMAL HIGH (ref 0.50–1.10)
Glucose, Bld: 90 mg/dL (ref 70–99)
Sodium: 140 mEq/L (ref 135–145)

## 2011-06-25 ENCOUNTER — Encounter: Payer: Self-pay | Admitting: Internal Medicine

## 2011-07-10 ENCOUNTER — Encounter: Payer: Self-pay | Admitting: Internal Medicine

## 2011-07-10 DIAGNOSIS — I495 Sick sinus syndrome: Secondary | ICD-10-CM

## 2011-07-26 LAB — URINALYSIS, ROUTINE W REFLEX MICROSCOPIC
Bilirubin Urine: NEGATIVE
Nitrite: NEGATIVE
Specific Gravity, Urine: 1.026
Urobilinogen, UA: 0.2
pH: 5

## 2011-07-26 LAB — BASIC METABOLIC PANEL
BUN: 28 — ABNORMAL HIGH
CO2: 24
CO2: 25
Calcium: 8.1 — ABNORMAL LOW
Calcium: 9.2
Chloride: 105
Creatinine, Ser: 1.1
GFR calc Af Amer: 38 — ABNORMAL LOW
GFR calc Af Amer: 44 — ABNORMAL LOW
GFR calc Af Amer: 48 — ABNORMAL LOW
GFR calc Af Amer: 49 — ABNORMAL LOW
GFR calc Af Amer: 49 — ABNORMAL LOW
GFR calc Af Amer: 58 — ABNORMAL LOW
GFR calc non Af Amer: 31 — ABNORMAL LOW
GFR calc non Af Amer: 40 — ABNORMAL LOW
GFR calc non Af Amer: 40 — ABNORMAL LOW
GFR calc non Af Amer: 41 — ABNORMAL LOW
GFR calc non Af Amer: 48 — ABNORMAL LOW
Glucose, Bld: 111 — ABNORMAL HIGH
Glucose, Bld: 186 — ABNORMAL HIGH
Potassium: 3.7
Potassium: 3.8
Potassium: 3.9
Potassium: 3.9
Potassium: 3.9
Potassium: 4
Sodium: 133 — ABNORMAL LOW
Sodium: 136
Sodium: 137
Sodium: 138
Sodium: 139

## 2011-07-26 LAB — CBC
HCT: 24.6 — ABNORMAL LOW
HCT: 33.8 — ABNORMAL LOW
HCT: 35.2 — ABNORMAL LOW
Hemoglobin: 11.2 — ABNORMAL LOW
Hemoglobin: 11.6 — ABNORMAL LOW
Hemoglobin: 12.1
Hemoglobin: 8.5 — ABNORMAL LOW
MCHC: 35.3
MCV: 92.6
Platelets: 214
RBC: 2.58 — ABNORMAL LOW
RBC: 3.51 — ABNORMAL LOW
RBC: 3.69 — ABNORMAL LOW
RBC: 3.75 — ABNORMAL LOW
RBC: 3.85 — ABNORMAL LOW
WBC: 10.9 — ABNORMAL HIGH
WBC: 15.5 — ABNORMAL HIGH
WBC: 6.9
WBC: 8.6

## 2011-07-26 LAB — POCT CARDIAC MARKERS
CKMB, poc: 1.1
Myoglobin, poc: 127
Myoglobin, poc: 141
Operator id: 1192
Operator id: 4531
Troponin i, poc: <0.05

## 2011-07-26 LAB — CROSSMATCH
Antibody Screen: POSITIVE
DAT, IgG: NEGATIVE
Donor AG Type: NEGATIVE
PT AG Type: NEGATIVE

## 2011-07-26 LAB — HEMOGLOBIN AND HEMATOCRIT, BLOOD
HCT: 34.3 — ABNORMAL LOW
Hemoglobin: 10.2 — ABNORMAL LOW

## 2011-07-26 LAB — CARDIAC PANEL(CRET KIN+CKTOT+MB+TROPI)
CK, MB: 2.6
CK, MB: 2.9
Relative Index: 2.4
Relative Index: INVALID
Troponin I: 0.1 — ABNORMAL HIGH
Troponin I: 0.12 — ABNORMAL HIGH

## 2011-07-26 LAB — B-NATRIURETIC PEPTIDE (CONVERTED LAB)
Pro B Natriuretic peptide (BNP): 306 — ABNORMAL HIGH
Pro B Natriuretic peptide (BNP): 89.9

## 2011-07-26 LAB — TSH: TSH: 1.553

## 2011-07-29 LAB — CBC
HCT: 27.7 — ABNORMAL LOW
HCT: 33.6 — ABNORMAL LOW
HCT: 34.6 — ABNORMAL LOW
HCT: 40.5
HCT: 36.3
Hemoglobin: 10.8 — ABNORMAL LOW
Hemoglobin: 10.9 — ABNORMAL LOW
Hemoglobin: 11.4 — ABNORMAL LOW
Hemoglobin: 11.8 — ABNORMAL LOW
Hemoglobin: 13.4
Hemoglobin: 9.1 — ABNORMAL LOW
MCHC: 32.8
MCHC: 34.1
MCHC: 34.2
MCV: 93.8
MCV: 94.1
MCV: 94.3
MCV: 95.2
MCV: 95.9
MCV: 93.3
Platelets: 390
Platelets: 411 — ABNORMAL HIGH
Platelets: 462 — ABNORMAL HIGH
Platelets: 490 — ABNORMAL HIGH
Platelets: 611 — ABNORMAL HIGH
Platelets: 362
RBC: 2.89 — ABNORMAL LOW
RBC: 3.4 — ABNORMAL LOW
RBC: 3.41 — ABNORMAL LOW
RBC: 3.48 — ABNORMAL LOW
RBC: 3.56 — ABNORMAL LOW
RBC: 3.69 — ABNORMAL LOW
RDW: 15.3
RDW: 15.6 — ABNORMAL HIGH
RDW: 16.1 — ABNORMAL HIGH
WBC: 15.2 — ABNORMAL HIGH
WBC: 7.4
WBC: 8.3
WBC: 8.4
WBC: 8.6
WBC: 8.8
WBC: 9
WBC: 8.1

## 2011-07-29 LAB — DIFFERENTIAL
Basophils Absolute: 0.1
Basophils Absolute: 0.1
Basophils Absolute: 0.1
Basophils Relative: 0
Basophils Relative: 1
Eosinophils Absolute: 0.2
Eosinophils Relative: 4
Eosinophils Relative: 2
Lymphocytes Relative: 27
Lymphocytes Relative: 3 — ABNORMAL LOW
Lymphocytes Relative: 22
Lymphs Abs: 1.8
Lymphs Abs: 1.8
Monocytes Absolute: 0.8
Monocytes Absolute: 0.6
Monocytes Relative: 11
Monocytes Relative: 8
Neutro Abs: 14.5 — ABNORMAL HIGH
Neutro Abs: 4
Neutro Abs: 5.3
Neutrophils Relative %: 95 — ABNORMAL HIGH
Neutrophils Relative %: 66

## 2011-07-29 LAB — URINALYSIS, ROUTINE W REFLEX MICROSCOPIC
Bilirubin Urine: NEGATIVE
Glucose, UA: NEGATIVE
Hgb urine dipstick: NEGATIVE
Ketones, ur: NEGATIVE
Nitrite: NEGATIVE
Protein, ur: 100 — AB
Specific Gravity, Urine: 1.025
Urobilinogen, UA: 1
Urobilinogen, UA: 0.2
pH: 5

## 2011-07-29 LAB — BASIC METABOLIC PANEL WITH GFR
BUN: 2 — ABNORMAL LOW
BUN: 2 — ABNORMAL LOW
BUN: 8
CO2: 21
CO2: 22
CO2: 25
Calcium: 8.6
Calcium: 8.8
Calcium: 9.3
Chloride: 110
Chloride: 114 — ABNORMAL HIGH
Chloride: 106
Creatinine, Ser: 0.96
Creatinine, Ser: 0.97
Creatinine, Ser: 1.05
GFR calc non Af Amer: 55 — ABNORMAL LOW
GFR calc non Af Amer: 56 — ABNORMAL LOW
GFR calc non Af Amer: 50 — ABNORMAL LOW
Glucose, Bld: 129 — ABNORMAL HIGH
Glucose, Bld: 133 — ABNORMAL HIGH
Glucose, Bld: 126 — ABNORMAL HIGH
Potassium: 3.4 — ABNORMAL LOW
Potassium: 3.8
Potassium: 4.7
Sodium: 142
Sodium: 142
Sodium: 139

## 2011-07-29 LAB — BASIC METABOLIC PANEL
CO2: 25
CO2: 25
Calcium: 8.5
Calcium: 9.2
Chloride: 106
Chloride: 106
Chloride: 108
Creatinine, Ser: 0.98
Creatinine, Ser: 1
Creatinine, Ser: 1.03
GFR calc Af Amer: >60
GFR calc Af Amer: >60
GFR calc Af Amer: >60
GFR calc Af Amer: >60
GFR calc non Af Amer: 57 — ABNORMAL LOW
Glucose, Bld: 185 — ABNORMAL HIGH
Potassium: 3.5
Potassium: 3.9
Sodium: 138
Sodium: 139
Sodium: 139

## 2011-07-29 LAB — COMPREHENSIVE METABOLIC PANEL
Albumin: 3.6
Alkaline Phosphatase: 136 — ABNORMAL HIGH
BUN: 20
CO2: 24
Chloride: 103
Creatinine, Ser: 1.12
GFR calc non Af Amer: 47 — ABNORMAL LOW
Glucose, Bld: 144 — ABNORMAL HIGH
Potassium: 3.9
Total Bilirubin: 0.9

## 2011-07-29 LAB — URINE CULTURE
Colony Count: 10000
Special Requests: NEGATIVE

## 2011-07-29 LAB — URINE MICROSCOPIC-ADD ON

## 2011-07-29 LAB — LIPASE, BLOOD: Lipase: 36

## 2011-07-29 LAB — CLOSTRIDIUM DIFFICILE EIA: C difficile Toxins A+B, EIA: NEGATIVE

## 2011-07-29 LAB — MAGNESIUM
Magnesium: 1.5
Magnesium: 2.1

## 2011-07-29 LAB — PHOSPHORUS: Phosphorus: 4.4

## 2011-07-31 LAB — COMPREHENSIVE METABOLIC PANEL
ALT: 14
Alkaline Phosphatase: 86
CO2: 18 — ABNORMAL LOW
GFR calc non Af Amer: 30 — ABNORMAL LOW
Glucose, Bld: 158 — ABNORMAL HIGH
Potassium: 3.9
Sodium: 141

## 2011-07-31 LAB — CBC
HCT: 32.4 — ABNORMAL LOW
Hemoglobin: 12.4
MCHC: 34.3
MCV: 94.1
Platelets: 305
RBC: 3.88
RDW: 14.4
WBC: 10.8 — ABNORMAL HIGH

## 2011-07-31 LAB — BASIC METABOLIC PANEL
BUN: 22
Chloride: 113 — ABNORMAL HIGH
Creatinine, Ser: 1.45 — ABNORMAL HIGH
Glucose, Bld: 107 — ABNORMAL HIGH
Potassium: 4.3

## 2011-07-31 LAB — CARDIAC PANEL(CRET KIN+CKTOT+MB+TROPI)
CK, MB: 1.2
CK, MB: 1.3
Relative Index: INVALID
Relative Index: INVALID
Total CK: 34
Total CK: 38
Total CK: 42
Troponin I: 0.02

## 2011-07-31 LAB — HEMOGLOBIN A1C: Hgb A1c MFr Bld: 6.2 — ABNORMAL HIGH

## 2011-07-31 LAB — LIPID PANEL
Total CHOL/HDL Ratio: 3.6
VLDL: 14

## 2011-07-31 LAB — DIGOXIN LEVEL: Digoxin Level: 0.7 — ABNORMAL LOW

## 2011-08-05 LAB — CBC
HCT: 37.7
Platelets: 323
RDW: 13.9
WBC: 7.6

## 2011-08-05 LAB — BASIC METABOLIC PANEL
BUN: 30 — ABNORMAL HIGH
Calcium: 8.9
Creatinine, Ser: 1.18
GFR calc non Af Amer: 44 — ABNORMAL LOW
Glucose, Bld: 98
Potassium: 4.7

## 2011-10-09 ENCOUNTER — Encounter: Payer: Self-pay | Admitting: Internal Medicine

## 2011-10-09 DIAGNOSIS — I495 Sick sinus syndrome: Secondary | ICD-10-CM

## 2011-11-20 ENCOUNTER — Other Ambulatory Visit: Payer: Self-pay | Admitting: Cardiology

## 2011-12-09 ENCOUNTER — Other Ambulatory Visit: Payer: Self-pay | Admitting: Cardiology

## 2012-01-07 ENCOUNTER — Other Ambulatory Visit: Payer: Self-pay | Admitting: Internal Medicine

## 2012-01-07 DIAGNOSIS — N289 Disorder of kidney and ureter, unspecified: Secondary | ICD-10-CM

## 2012-01-08 ENCOUNTER — Ambulatory Visit
Admission: RE | Admit: 2012-01-08 | Discharge: 2012-01-08 | Disposition: A | Discharge status: Home / Self Care | Payer: Medicare Other | Source: Ambulatory Visit | Attending: Internal Medicine | Admitting: Internal Medicine

## 2012-01-08 ENCOUNTER — Encounter: Payer: Self-pay | Admitting: Internal Medicine

## 2012-01-08 DIAGNOSIS — N289 Disorder of kidney and ureter, unspecified: Secondary | ICD-10-CM

## 2012-01-08 DIAGNOSIS — I495 Sick sinus syndrome: Secondary | ICD-10-CM

## 2012-01-09 ENCOUNTER — Other Ambulatory Visit: Payer: Self-pay | Admitting: Cardiology

## 2012-01-21 ENCOUNTER — Other Ambulatory Visit: Payer: Self-pay | Admitting: Internal Medicine

## 2012-01-21 DIAGNOSIS — N281 Cyst of kidney, acquired: Secondary | ICD-10-CM

## 2012-01-23 ENCOUNTER — Ambulatory Visit
Admission: RE | Admit: 2012-01-23 | Discharge: 2012-01-23 | Disposition: A | Discharge status: Home / Self Care | Payer: Medicare Other | Source: Ambulatory Visit | Attending: Internal Medicine | Admitting: Internal Medicine

## 2012-01-23 DIAGNOSIS — N281 Cyst of kidney, acquired: Secondary | ICD-10-CM

## 2012-03-12 ENCOUNTER — Emergency Department (HOSPITAL_COMMUNITY)
Admission: EM | Admit: 2012-03-12 | Discharge: 2012-03-12 | Disposition: A | Discharge status: Home Health | Payer: Medicare Other | Attending: Emergency Medicine | Admitting: Emergency Medicine

## 2012-03-12 ENCOUNTER — Emergency Department (HOSPITAL_COMMUNITY): Payer: Medicare Other

## 2012-03-12 ENCOUNTER — Encounter (HOSPITAL_COMMUNITY): Payer: Self-pay | Admitting: Emergency Medicine

## 2012-03-12 DIAGNOSIS — M169 Osteoarthritis of hip, unspecified: Secondary | ICD-10-CM | POA: Insufficient documentation

## 2012-03-12 DIAGNOSIS — Z79899 Other long term (current) drug therapy: Secondary | ICD-10-CM | POA: Insufficient documentation

## 2012-03-12 DIAGNOSIS — M1610 Unilateral primary osteoarthritis, unspecified hip: Secondary | ICD-10-CM | POA: Insufficient documentation

## 2012-03-12 DIAGNOSIS — M25559 Pain in unspecified hip: Secondary | ICD-10-CM | POA: Insufficient documentation

## 2012-03-12 DIAGNOSIS — Z853 Personal history of malignant neoplasm of breast: Secondary | ICD-10-CM | POA: Insufficient documentation

## 2012-03-12 DIAGNOSIS — R000 Tachycardia, unspecified: Secondary | ICD-10-CM | POA: Insufficient documentation

## 2012-03-12 DIAGNOSIS — M79609 Pain in unspecified limb: Secondary | ICD-10-CM | POA: Insufficient documentation

## 2012-03-12 DIAGNOSIS — M1990 Unspecified osteoarthritis, unspecified site: Secondary | ICD-10-CM

## 2012-03-12 DIAGNOSIS — R42 Dizziness and giddiness: Secondary | ICD-10-CM | POA: Insufficient documentation

## 2012-03-12 DIAGNOSIS — R5381 Other malaise: Secondary | ICD-10-CM | POA: Insufficient documentation

## 2012-03-12 DIAGNOSIS — F411 Generalized anxiety disorder: Secondary | ICD-10-CM | POA: Insufficient documentation

## 2012-03-12 LAB — DIFFERENTIAL
Basophils Absolute: 0 10*3/uL (ref 0.0–0.1)
Basophils Relative: 0 % (ref 0–1)
Lymphocytes Relative: 7 % — ABNORMAL LOW (ref 12–46)
Monocytes Relative: 5 % (ref 3–12)
Neutro Abs: 12.6 10*3/uL — ABNORMAL HIGH (ref 1.7–7.7)
Neutrophils Relative %: 86 % — ABNORMAL HIGH (ref 43–77)

## 2012-03-12 LAB — BASIC METABOLIC PANEL
CO2: 19 mEq/L (ref 19–32)
Chloride: 103 mEq/L (ref 96–112)
GFR calc Af Amer: 36 mL/min — ABNORMAL LOW (ref >90)
Potassium: 4.9 mEq/L (ref 3.5–5.1)

## 2012-03-12 LAB — CARDIAC PANEL(CRET KIN+CKTOT+MB+TROPI)
Relative Index: INVALID (ref 0.0–2.5)
Total CK: 43 U/L (ref 7–177)
Troponin I: <0.3 ng/mL (ref <0.30)

## 2012-03-12 LAB — CBC
Hemoglobin: 13.1 g/dL (ref 12.0–15.0)
MCHC: 33.9 g/dL (ref 30.0–36.0)
WBC: 14.7 10*3/uL — ABNORMAL HIGH (ref 4.0–10.5)

## 2012-03-12 LAB — URINALYSIS, ROUTINE W REFLEX MICROSCOPIC
Bilirubin Urine: NEGATIVE
Ketones, ur: 40 mg/dL — AB
Nitrite: NEGATIVE
Protein, ur: NEGATIVE mg/dL
Urobilinogen, UA: 1 mg/dL (ref 0.0–1.0)

## 2012-03-12 MED ORDER — TRAMADOL HCL 50 MG PO TABS
50.0000 mg | ORAL_TABLET | Freq: Once | ORAL | Status: AC
  Administered 2012-03-12: 50 mg via ORAL
  Filled 2012-03-12: qty 1

## 2012-03-12 MED ORDER — TRAMADOL HCL 50 MG PO TABS: 50.0000 mg | ORAL_TABLET | Freq: Two times a day (BID) | ORAL | Status: AC

## 2012-03-12 NOTE — ED Provider Notes (Addendum)
History     CSN: [id number]  Arrival date & time 03/12/12  1110   First MD Initiated Contact with Patient 03/12/12 1125      Chief Complaint  Patient presents with  . Weakness  . Hip Pain    (Consider location/radiation/quality/duration/timing/severity/associated sxs/prior treatment) Patient is a 78 y.o. female presenting with weakness and hip pain. The history is provided by the patient and a relative.  Weakness The primary symptoms include dizziness. Primary symptoms do not include syncope, loss of consciousness, visual change, focal weakness, loss of sensation, nausea or vomiting. The symptoms began less than 1 hour ago. The symptoms are resolved. The neurological symptoms are diffuse. Context: While she was at therapy she had severe hip pain and then had this episode of dizziness.  Dizziness also occurs with weakness. Dizziness does not occur with nausea or vomiting.  Additional symptoms include weakness and leg pain. Additional symptoms do not include lower back pain or loss of balance. Medical issues do not include seizures. Procedure history comments: Prior history of sick sinus syndrome status post pacemaker.  Hip Pain This is a chronic problem. Episode onset: She has had leg pain over the last 2 years but it's been worse since Sunday when she fell. The problem occurs constantly. The problem has been gradually worsening. Pertinent negatives include no chest pain and no shortness of breath. The symptoms are aggravated by walking and bending. The symptoms are relieved by nothing. She has tried ASA for the symptoms. The treatment provided no relief.    Past Medical History  Diagnosis Date  . Syncope   . Sinus node dysfunction   . S/P cardiac pacemaker procedure     Guidant  . Wide-complex tachycardia   . Breast cancer   . Cholelithiasis   . Glucose intolerance (impaired glucose tolerance)   . DJD (degenerative joint disease)   . Anxiety     Past Surgical History  Procedure  Date  . S/p pacemaker   . Replacement total knee bilateral   . Total shoulder replacement   . Total hip arthroplasty   . Femur fracture surgery   . Hysterectomy, unspecified area   . Masterectomy     History reviewed. No pertinent family history.  History  Substance Use Topics  . Smoking status: Former Games developer  . Smokeless tobacco: Not on file  . Alcohol Use: No    OB History    Grav Para Term Preterm Abortions TAB SAB Ect Mult Living                  Review of Systems  Respiratory: Negative for cough and shortness of breath.   Cardiovascular: Negative for chest pain and syncope.  Gastrointestinal: Negative for nausea and vomiting.  Neurological: Positive for dizziness and weakness. Negative for focal weakness, loss of consciousness, syncope and loss of balance.  All other systems reviewed and are negative.    Allergies  Codeine and Other  Home Medications   Current Outpatient Rx  Name Route Sig Dispense Refill  . ALPRAZOLAM 0.25 MG PO TABS Oral Take 0.25 mg by mouth 2 (two) times daily.      [name] VITAMIN D PO Oral Take 1 tablet by mouth daily.    [name] DABIGATRAN ETEXILATE MESYLATE 75 MG PO CAPS Oral Take 75 mg by mouth 2 (two) times daily.  60 capsule   . METOPROLOL SUCCINATE ER 50 MG PO TB24 Oral Take 50 mg by mouth daily. Take with or immediately following a meal.    .  PANTOPRAZOLE SODIUM 40 MG PO TBEC Oral Take 40 mg by mouth daily.     . TRAMADOL HCL 50 MG PO TABS Oral Take 50 mg by mouth 2 (two) times daily.    [name] VITAMIN E PO Oral Take 1 tablet by mouth daily.      BP 147/75  Temp(Src) 97.3 F (36.3 C) (Oral)  Resp 20  SpO2 100%  Physical Exam  Nursing note and vitals reviewed. Constitutional: She is oriented to person, place, and time. She appears well-developed and well-nourished. She appears distressed.  HENT:  Head: Normocephalic and atraumatic.  Eyes: EOM are normal. Pupils are equal, round, and reactive to light.  Cardiovascular: Normal heart sounds  and intact distal pulses.  An irregular rhythm present. Tachycardia present.  Exam reveals no friction rub.   No murmur heard. Pulmonary/Chest: Effort normal and breath sounds normal. She has no wheezes. She has no rales.  Abdominal: Soft. Bowel sounds are normal. She exhibits no distension. There is no tenderness. There is no rebound and no guarding.  Musculoskeletal: Normal range of motion. She exhibits no tenderness.       Left hip: She exhibits tenderness and bony tenderness. She exhibits normal range of motion, normal strength, no swelling and no deformity.       No edema  Neurological: She is alert and oriented to person, place, and time. No cranial nerve deficit.  Skin: Skin is warm and dry. No rash noted.  Psychiatric: Her behavior is normal. Her mood appears anxious.       Very anxious on exam    ED Course  Procedures (including critical care time)  Labs Reviewed  CBC - Abnormal; Notable for the following:    WBC 14.7 (*)    All other components within normal limits  DIFFERENTIAL - Abnormal; Notable for the following:    Neutrophils Relative 86 (*)    Neutro Abs 12.6 (*)    Lymphocytes Relative 7 (*)    All other components within normal limits  BASIC METABOLIC PANEL - Abnormal; Notable for the following:    Glucose, Bld 117 (*)    BUN 35 (*)    Creatinine, Ser 1.49 (*)    GFR calc non Af Amer 31 (*)    GFR calc Af Amer 36 (*)    All other components within normal limits  URINALYSIS, ROUTINE W REFLEX MICROSCOPIC - Abnormal; Notable for the following:    Ketones, ur 40 (*)    All other components within normal limits  CARDIAC PANEL(CRET KIN+CKTOT+MB+TROPI)   Dg Chest 2 View  03/12/2012  *RADIOLOGY REPORT*  Clinical Data: Dizziness.  CHEST - 2 VIEW  Comparison: 06/06/2011  Findings: Right pacer remains in place, unchanged. Heart and mediastinal contours are within normal limits.  No focal opacities or effusions.  No acute bony abnormality.  Remote right shoulder  replacement.  IMPRESSION: No active cardiopulmonary disease.  Original Report Authenticated By: Cyndie Chime, M.D.   Dg Hip Complete Left  03/12/2012  *RADIOLOGY REPORT*  Clinical Data: Weakness, left hip pain.  LEFT HIP - COMPLETE 2+ VIEW  Comparison: 03/28/2011  Findings: Severe degenerative changes again noted involving the left hip.  Progressive flattening of the femoral head.  Extensive sclerosis and complete loss of joint space.  Mild degenerative changes in the right hip.  Diffuse osteopenia.  No acute bony abnormality.  IMPRESSION: Severe degenerative changes in the left hip with progressive flattening of the femoral head.  Original Report Authenticated By: Aubery Lapping  Kearney Hard, M.D.     Date: 03/12/2012  Rate: 105  Rhythm: Paced rhythm  QRS Axis: indeterminate  Intervals: Paced rhythm  ST/T Wave abnormalities: nonspecific ST/T changes and Wide paced rhythm  Conduction Disutrbances:nonspecific intraventricular conduction delay  Narrative Interpretation:   Old EKG Reviewed: unchanged   1. Hip pain   2. Degenerative joint disease       MDM   Patient is a very anxious female who states she was feeling awful with morning when she went for therapy. She states her left hip has been hurting worse than normal. She had a mechanical fall on Tuesday when she was getting out of the car and the pain has been worse as the fall. She has been able to ambulate with her cane but also states on Sunday she ran out of her pain medication which was tramadol. She states she was following up with the doctor this week to see if she needed to continue the pain medicine but it was helping her pain in the past. Patient states the pain got so bad today she had an episode where she felt dizzy but did not lose consciousness. She denied any shortness of breath or chest pain. EKG today shows a persistent paced rhythm. Patient denies any infectious symptoms however because she is not feeling well will check a urine and a  BMP to ensure no abnormalities. Also will get a plain film of the hip to further evaluate for fracture in patient given a dose of her home medication to see if that improves her pain. Feel the pain is exacerbated by the fact that she's been out of her medication. Her family is present and states she is a very anxious person which is not unusual for her. Patient is very anxious on exam.  Nonspecific elevated white blood cell count of 14,000 the rest of her labs are within normal limits. No UTI. Chest x-ray within normal limits. Her left foot film shows severe degenerative changes which is most likely the cause of her pain. After tramadol the pain is improved but still there. All these findings were discussed with her family and she will followup.       Gwyneth Sprout, MD 03/12/12 1440  Gwyneth Sprout, MD 03/12/12 1442

## 2012-03-12 NOTE — ED Notes (Signed)
Patient transported to X-ray 

## 2012-03-12 NOTE — Discharge Instructions (Signed)
Degenerative Arthritis  You have osteoarthritis. This is the wear and tear arthritis that comes with aging. It is also called degenerative arthritis. This is common in people past middle age. It is caused by stress on the joints. The large weight bearing joints of the lower extremities are most often affected. The knees, hips, back, neck, and hands can become painful, swollen, and stiff. This is the most common type of arthritis. It comes on with age, carrying too much weight, or from an injury.  Treatment includes resting the sore joint until the pain and swelling improve. Crutches or a walker may be needed for severe flares. Only take over-the-counter or prescription medicines for pain, discomfort, or fever as directed by your caregiver. Local heat therapy may improve motion. Cortisone shots into the joint are sometimes used to reduce pain and swelling during flares.  Osteoarthritis is usually not crippling and progresses slowly. There are things you can do to decrease pain:  · Avoid high impact activities.  · Exercise regularly.  · Low impact exercises such as walking, biking and swimming help to keep the muscles strong and keep normal joint function.  · Stretching helps to keep your range of motion.  · Lose weight if you are overweight. This reduces joint stress.  In severe cases when you have pain at rest or increasing disability, joint surgery may be helpful. See your caregiver for follow-up treatment as recommended.   SEEK IMMEDIATE MEDICAL CARE IF:   · You have severe joint pain.  · Marked swelling and redness in your joint develops.  · You develop a high fever.  Document Released: 10/21/2005 Document Revised: 10/10/2011 Document Reviewed: 03/23/2007  [employer]® Patient Information ©2012 [employer].

## 2012-03-12 NOTE — ED Notes (Signed)
Per EMS: pt from PT c/o generalized weakness and generalized bilateral hip pain x several months; pt is seen by pain clinic; pt sts had fall 2 weeks ago and has anxiety about falling; per EMS when pt was doing standing and sitting exercise had two episodes of severe pain that made it unable for her to walk

## 2012-04-01 ENCOUNTER — Other Ambulatory Visit: Payer: Self-pay | Admitting: Cardiology

## 2012-04-26 ENCOUNTER — Other Ambulatory Visit: Payer: Self-pay | Admitting: Cardiology

## 2012-05-11 ENCOUNTER — Other Ambulatory Visit: Payer: Self-pay | Admitting: Cardiology

## 2012-06-02 ENCOUNTER — Telehealth: Payer: Self-pay | Admitting: Internal Medicine

## 2012-06-02 NOTE — Telephone Encounter (Signed)
New msg Pt is having hip replacement on [id number].

## 2012-06-02 NOTE — Telephone Encounter (Signed)
I spoke with the patient. She states that Dr. Charlann Boxer is performing her hip replacement on 8/13. She was letting us know that. I inquired if she needed a cardiac clearance. She states Dr. Nilsa Nutting office was supposed to notify us. I advised I had no information from Dr. Charlann Boxer. The patient did say she also sees Dr. Sharyn Lull. I explained they may have faxed her request for cardiac clearance to him. I called Dr. Nilsa Nutting office to inquire about this. Per surgery scheduling, they have received medical clearance from Dr. Selena Batten and they are waiting for Dr. Sharyn Lull to clear her as well. I have notified the patient of this and explained she does not need anything else from [place] at this time.

## 2012-06-03 ENCOUNTER — Encounter (HOSPITAL_COMMUNITY): Payer: Self-pay | Admitting: Pharmacy Technician

## 2012-06-09 NOTE — Patient Instructions (Signed)
22 Sophia Oliver  06/09/2012   Your procedure is scheduled on:  06/16/12 1230pm-215pm  Report to [facility] at 1000 AM.  Call this number if you have problems the morning of surgery: [phone]   Remember:   Do not eat food:After Midnight.  May have clear liquids:until Midnight . [name]  Take these medicines the morning of surgery with A SIP OF WATER:    Do not wear jewelry, make-up or nail polish.  Do not wear lotions, powders, or perfumes.   Do not shave 48 hours prior to surgery.   Do not bring valuables to the hospital.  Contacts, dentures or bridgework may not be worn into surgery.  Leave suitcase in the car. After surgery it may be brought to your room.  For patients admitted to the hospital, checkout time is 11:00 AM the day of discharge.  [name]    Special Instructions: CHG Shower Use Special Wash: 1/2 bottle night before surgery and 1/2 bottle morning of surgery. shower chin to toes with CHG.  Wash face and private parts with regular soap.     Please read over the following fact sheets that you were given: MRSA Information, Blood Transfusion Fact Sheet, Incentive Spirometry Fact Sheet, coughing and deep breathing exercises, leg exercises

## 2012-06-10 ENCOUNTER — Encounter (HOSPITAL_COMMUNITY)
Admission: RE | Admit: 2012-06-10 | Discharge: 2012-06-10 | Disposition: A | Discharge status: Home / Self Care | Payer: Medicare Other | Source: Ambulatory Visit | Attending: Orthopedic Surgery | Admitting: Orthopedic Surgery

## 2012-06-10 ENCOUNTER — Encounter (HOSPITAL_COMMUNITY): Payer: Self-pay

## 2012-06-10 HISTORY — DX: Other specified postprocedural states: Z98.890

## 2012-06-10 HISTORY — DX: Presence of cardiac pacemaker: Z95.0

## 2012-06-10 HISTORY — DX: Other specified postprocedural states: R11.2

## 2012-06-10 HISTORY — DX: Chronic kidney disease, unspecified: N18.9

## 2012-06-10 HISTORY — DX: Unspecified injury of right shoulder and upper arm, initial encounter: S49.91XA

## 2012-06-10 LAB — CBC
Hemoglobin: 12.4 g/dL (ref 12.0–15.0)
MCHC: 33.2 g/dL (ref 30.0–36.0)
WBC: 7.8 10*3/uL (ref 4.0–10.5)

## 2012-06-10 LAB — BASIC METABOLIC PANEL
BUN: 33 mg/dL — ABNORMAL HIGH (ref 6–23)
GFR calc Af Amer: 41 mL/min — ABNORMAL LOW (ref >90)
GFR calc non Af Amer: 35 mL/min — ABNORMAL LOW (ref >90)
Potassium: 4.4 mEq/L (ref 3.5–5.1)
Sodium: 139 mEq/L (ref 135–145)

## 2012-06-10 LAB — URINALYSIS, ROUTINE W REFLEX MICROSCOPIC
Bilirubin Urine: NEGATIVE
Ketones, ur: NEGATIVE mg/dL
Nitrite: NEGATIVE
Specific Gravity, Urine: 1.016 (ref 1.005–1.030)
Urobilinogen, UA: 0.2 mg/dL (ref 0.0–1.0)

## 2012-06-10 LAB — APTT: aPTT: 53 seconds — ABNORMAL HIGH (ref 24–37)

## 2012-06-10 LAB — PROTIME-INR
INR: 1.47 (ref 0.00–1.49)
Prothrombin Time: 18.1 seconds — ABNORMAL HIGH (ref 11.6–15.2)

## 2012-06-10 NOTE — Progress Notes (Signed)
Faxed and confirmation received on 06/10/12 to DR Charlann Boxer regarding abnormal urinalysis, and urine micro results, BMET  , pt,ptt.

## 2012-06-10 NOTE — H&P (Signed)
Sophia Oliver is an 78 y.o. female.    Chief Complaint:   Left hip OA and pain   HPI: Pt is a 77 y.o. female complaining of left hip pain for 3+ years. Pain had continually increased since the beginning, but worse over the last 8-9 months. X-rays in the clinic show end-stage arthritic changes of the left hip. Pt has tried various conservative treatments which have failed to alleviate their symptoms, including NSAIDs, multiple steroid injection and treatments from other doctors . Various options are discussed with the patient. Risks, benefits and expectations were discussed with the patient. Patient understand the risks, benefits and expectations and wishes to proceed with surgery.   PCP:  Pearson Grippe, MD  D/C Plans:  SNF - [facility]  Post-op Meds:  No Rx given  Tranexamic Acid:   Not to be given - Pacemaker  Decadron:   To be given  PMH: Past Medical History  Diagnosis Date  . Syncope   . Sinus node dysfunction   . S/P cardiac pacemaker procedure     Guidant  . Wide-complex tachycardia   . Cholelithiasis   . Glucose intolerance (impaired glucose tolerance)   . DJD (degenerative joint disease)   . Anxiety   . PONV (postoperative nausea and vomiting)   . Pacemaker   . Chronic kidney disease     frequency  . Breast cancer     left breast - hx of 2003   . Injury of right upper arm     after venipuncture in 3/13 - pt describes pain afterwards- no followup done seen by PCP- no further treatment     PSH: Past Surgical History  Procedure Date  . S/p pacemaker   . Replacement total knee bilateral   . Total shoulder replacement   . Total hip arthroplasty   . Femur fracture surgery   . Hysterectomy, unspecified area   . Masterectomy   . Abdominal hysterectomy   . Breast surgery     left breast - 2003     Social History:  reports that she quit smoking about 63 years ago. She has never used smokeless tobacco. She reports that she does not drink alcohol or use illicit  drugs.  Allergies:  Allergies  Allergen Reactions  . Codeine Nausea And Vomiting  . Other Nausea And Vomiting    Medications: No current facility-administered medications for this encounter.   Current Outpatient Prescriptions  Medication Sig Dispense Refill  . ALPRAZolam (XANAX) 0.25 MG tablet Take 0.25 mg by mouth every morning.       . cholecalciferol (VITAMIN D) 1000 UNITS tablet Take 1,000 Units by mouth daily.      . dabigatran (PRADAXA) 75 MG CAPS Take 75 mg by mouth 2 (two) times daily.   60 capsule    . fish oil-omega-3 fatty acids 1000 MG capsule Take 1 g by mouth daily.      . metoprolol succinate (TOPROL-XL) 50 MG 24 hr tablet Take 50 mg by mouth 2 (two) times daily. Take with or immediately following a meal.      . pantoprazole (PROTONIX) 40 MG tablet Take 40 mg by mouth every morning.       . sitaGLIPtin (JANUVIA) 100 MG tablet Take 50 mg by mouth every morning.      [name] spironolactone (ALDACTONE) 25 MG tablet Take 25 mg by mouth every morning.        Results for orders placed during the hospital encounter of 06/10/12 (from the past  48 hour(s))  URINALYSIS, ROUTINE W REFLEX MICROSCOPIC     Status: Abnormal   Collection Time   06/10/12 12:35 PM      Component Value Range Comment   Color, Urine YELLOW  YELLOW    APPearance CLEAR  CLEAR    Specific Gravity, Urine 1.016  1.005 - 1.030    pH 6.0  5.0 - 8.0    Glucose, UA NEGATIVE  NEGATIVE mg/dL    Hgb urine dipstick NEGATIVE  NEGATIVE    Bilirubin Urine NEGATIVE  NEGATIVE    Ketones, ur NEGATIVE  NEGATIVE mg/dL    Protein, ur NEGATIVE  NEGATIVE mg/dL    Urobilinogen, UA 0.2  0.0 - 1.0 mg/dL    Nitrite NEGATIVE  NEGATIVE    Leukocytes, UA MODERATE (*) NEGATIVE   SURGICAL PCR SCREEN     Status: Normal   Collection Time   06/10/12 12:35 PM      Component Value Range Comment   MRSA, PCR NEGATIVE  NEGATIVE    Staphylococcus aureus NEGATIVE  NEGATIVE   URINE MICROSCOPIC-ADD ON     Status: Abnormal   Collection Time    06/10/12 12:35 PM      Component Value Range Comment   Squamous Epithelial / LPF FEW (*) RARE    WBC, UA 7-10  <3 WBC/hpf    Bacteria, UA FEW (*) RARE   APTT     Status: Abnormal   Collection Time   06/10/12  1:45 PM      Component Value Range Comment   aPTT 53 (*) 24 - 37 seconds   BASIC METABOLIC PANEL     Status: Abnormal   Collection Time   06/10/12  1:45 PM      Component Value Range Comment   Sodium 139  135 - 145 mEq/L    Potassium 4.4  3.5 - 5.1 mEq/L    Chloride 103  96 - 112 mEq/L    CO2 26  19 - 32 mEq/L    Glucose, Bld 104 (*) 70 - 99 mg/dL    BUN 33 (*) 6 - 23 mg/dL    Creatinine, Ser 1.61 (*) 0.50 - 1.10 mg/dL    Calcium 9.4  8.4 - 09.6 mg/dL    GFR calc non Af Amer 35 (*) >90 mL/min    GFR calc Af Amer 41 (*) >90 mL/min   CBC     Status: Normal   Collection Time   06/10/12  1:45 PM      Component Value Range Comment   WBC 7.8  4.0 - 10.5 K/uL    RBC 3.88  3.87 - 5.11 MIL/uL    Hemoglobin 12.4  12.0 - 15.0 g/dL    HCT 04.5  40.9 - 81.1 %    MCV 96.1  78.0 - 100.0 fL    MCH 32.0  26.0 - 34.0 pg    MCHC 33.2  30.0 - 36.0 g/dL    RDW 91.4  78.2 - 95.6 %    Platelets 247  150 - 400 K/uL   PROTIME-INR     Status: Abnormal   Collection Time   06/10/12  1:45 PM      Component Value Range Comment   Prothrombin Time 18.1 (*) 11.6 - 15.2 seconds    INR 1.47  0.00 - 1.49     ROS: Review of Systems  Constitutional: Negative.   HENT: Negative.   Eyes: Negative.   Respiratory: Negative.   Cardiovascular: Negative.  Gastrointestinal: Negative.   Genitourinary: Positive for frequency.  Musculoskeletal: Positive for joint pain.  Skin: Negative.   Neurological: Negative.   Endo/Heme/Allergies: Negative.   Psychiatric/Behavioral: Negative.      Physical Exam: BP: 110/68 ; HR: 78 ; Resp: 16 Physical Exam  Constitutional: She is oriented to person, place, and time and well-developed, well-nourished, and in no distress.  HENT:  Head: Normocephalic and atraumatic.   Nose: Nose normal.  Mouth/Throat: Oropharynx is clear and moist.  Eyes: Pupils are equal, round, and reactive to light.  Neck: Neck supple. No JVD present. No tracheal deviation present. No thyromegaly present.  Cardiovascular: Normal rate and intact distal pulses.        Pacemaker  Pulmonary/Chest: Effort normal and breath sounds normal. No stridor. No respiratory distress. She has no wheezes. She has no rales. She exhibits no tenderness.  Abdominal: Soft. There is no tenderness. There is no guarding.  Musculoskeletal:       Left hip: She exhibits decreased range of motion, decreased strength, tenderness, bony tenderness and crepitus. She exhibits no swelling, no deformity and no laceration.  Lymphadenopathy:    She has no cervical adenopathy.  Neurological: She is alert and oriented to person, place, and time.  Skin: Skin is warm and dry.  Psychiatric: Affect normal.     Assessment/Plan Assessment:   Left hip OA and pain   Plan: Patient will undergo a left total hip arthroplasty, posterior approach on 06/16/2012 per Dr. Charlann Boxer at [facility]. Risks benefits and expectation were discussed with the patient. Patient understand risks, benefits and expectation and wishes to proceed.   Anastasio Auerbach Deshia Vanderhoof   PAC  06/10/2012, 4:36 PM

## 2012-06-10 NOTE — Progress Notes (Signed)
Transtelephonic Pacemaker Report in EPIC done 01/08/12 Last office visit with Dr Graciela Husbands 05/24/11 - EPIC  Last device check in office 05/24/11  EKG and CXR 03/12/12 EPIC

## 2012-06-12 NOTE — Progress Notes (Signed)
Rerequested information from office of Dr Sharyn Lull ( cardiology ) regarding most recent stress test, echo, ekg and echo.  Office stated they will fax.

## 2012-06-12 NOTE — Progress Notes (Signed)
Rerequested info from Dr Sharyn Lull ( cardiology ) office of EKG, Stress, Echo and most recent office visit note by phone.

## 2012-06-15 NOTE — Progress Notes (Signed)
Requested for the third request by phone last office visit note, ekg and any further cardiac information on patient.  Office stated they would fax.

## 2012-06-16 ENCOUNTER — Encounter (HOSPITAL_COMMUNITY): Payer: Self-pay | Admitting: Anesthesiology

## 2012-06-16 ENCOUNTER — Inpatient Hospital Stay (HOSPITAL_COMMUNITY): Payer: Medicare Other

## 2012-06-16 ENCOUNTER — Inpatient Hospital Stay (HOSPITAL_COMMUNITY)
Admission: RE | Admit: 2012-06-16 | Discharge: 2012-06-19 | DRG: 470 | Disposition: A | Discharge status: Skilled Nursing Facility | Payer: Medicare Other | Source: Ambulatory Visit | Attending: Orthopedic Surgery | Admitting: Orthopedic Surgery

## 2012-06-16 ENCOUNTER — Ambulatory Visit (HOSPITAL_COMMUNITY): Payer: Medicare Other

## 2012-06-16 ENCOUNTER — Encounter (HOSPITAL_COMMUNITY)
Admission: RE | Disposition: A | Discharge status: Skilled Nursing Facility | Payer: Self-pay | Source: Ambulatory Visit | Attending: Orthopedic Surgery

## 2012-06-16 ENCOUNTER — Encounter (HOSPITAL_COMMUNITY): Payer: Self-pay | Admitting: *Deleted

## 2012-06-16 ENCOUNTER — Other Ambulatory Visit: Payer: Self-pay

## 2012-06-16 ENCOUNTER — Ambulatory Visit (HOSPITAL_COMMUNITY): Payer: Medicare Other | Admitting: Anesthesiology

## 2012-06-16 DIAGNOSIS — N182 Chronic kidney disease, stage 2 (mild): Secondary | ICD-10-CM | POA: Diagnosis present

## 2012-06-16 DIAGNOSIS — D649 Anemia, unspecified: Secondary | ICD-10-CM | POA: Diagnosis not present

## 2012-06-16 DIAGNOSIS — F411 Generalized anxiety disorder: Secondary | ICD-10-CM | POA: Diagnosis present

## 2012-06-16 DIAGNOSIS — M1610 Unilateral primary osteoarthritis, unspecified hip: Principal | ICD-10-CM | POA: Diagnosis present

## 2012-06-16 DIAGNOSIS — Z950 Presence of cardiac pacemaker: Secondary | ICD-10-CM

## 2012-06-16 DIAGNOSIS — M169 Osteoarthritis of hip, unspecified: Principal | ICD-10-CM | POA: Diagnosis present

## 2012-06-16 DIAGNOSIS — R000 Tachycardia, unspecified: Secondary | ICD-10-CM | POA: Diagnosis not present

## 2012-06-16 DIAGNOSIS — I509 Heart failure, unspecified: Secondary | ICD-10-CM | POA: Diagnosis not present

## 2012-06-16 DIAGNOSIS — I519 Heart disease, unspecified: Secondary | ICD-10-CM | POA: Diagnosis not present

## 2012-06-16 DIAGNOSIS — Z853 Personal history of malignant neoplasm of breast: Secondary | ICD-10-CM

## 2012-06-16 DIAGNOSIS — Y831 Surgical operation with implant of artificial internal device as the cause of abnormal reaction of the patient, or of later complication, without mention of misadventure at the time of the procedure: Secondary | ICD-10-CM | POA: Diagnosis not present

## 2012-06-16 DIAGNOSIS — Z01812 Encounter for preprocedural laboratory examination: Secondary | ICD-10-CM

## 2012-06-16 DIAGNOSIS — I4891 Unspecified atrial fibrillation: Secondary | ICD-10-CM | POA: Diagnosis present

## 2012-06-16 DIAGNOSIS — Z96649 Presence of unspecified artificial hip joint: Secondary | ICD-10-CM

## 2012-06-16 HISTORY — PX: TOTAL HIP ARTHROPLASTY: SHX124

## 2012-06-16 SURGERY — ARTHROPLASTY, HIP, TOTAL,POSTERIOR APPROACH
Anesthesia: General | Site: Hip | Laterality: Left | Wound class: Clean

## 2012-06-16 MED ORDER — ALUM & MAG HYDROXIDE-SIMETH 200-200-20 MG/5ML PO SUSP
30.0000 mL | ORAL | Status: DC | PRN
  Filled 2012-06-16: qty 30

## 2012-06-16 MED ORDER — PHENOL 1.4 % MT LIQD
1.0000 | OROMUCOSAL | Status: DC | PRN
  Filled 2012-06-16: qty 177

## 2012-06-16 MED ORDER — FUROSEMIDE 10 MG/ML IJ SOLN: 10.0000 mg | Freq: Once | INTRAMUSCULAR | Status: AC

## 2012-06-16 MED ORDER — DILTIAZEM HCL 100 MG IV SOLR
5.0000 mg/h | INTRAVENOUS | Status: DC
  Administered 2012-06-16: 5 mg/h via INTRAVENOUS
  Filled 2012-06-16: qty 100

## 2012-06-16 MED ORDER — SPIRONOLACTONE 25 MG PO TABS
25.0000 mg | ORAL_TABLET | Freq: Every day | ORAL | Status: DC
  Administered 2012-06-17 – 2012-06-19 (×3): 25 mg via ORAL
  Filled 2012-06-16 (×4): qty 1

## 2012-06-16 MED ORDER — LACTATED RINGERS IV SOLN
INTRAVENOUS | Status: DC
  Administered 2012-06-16 (×2): via INTRAVENOUS
  Administered 2012-06-16: 1000 mL via INTRAVENOUS

## 2012-06-16 MED ORDER — CEFAZOLIN SODIUM-DEXTROSE 2-3 GM-% IV SOLR
2.0000 g | INTRAVENOUS | Status: AC
  Administered 2012-06-16: 2 g via INTRAVENOUS

## 2012-06-16 MED ORDER — METOCLOPRAMIDE HCL 5 MG/ML IJ SOLN
5.0000 mg | Freq: Three times a day (TID) | INTRAMUSCULAR | Status: DC | PRN
  Administered 2012-06-16: 10 mg via INTRAVENOUS
  Filled 2012-06-16: qty 2

## 2012-06-16 MED ORDER — SODIUM CHLORIDE 0.9 % IV SOLN
100.0000 mL/h | INTRAVENOUS | Status: DC
  Administered 2012-06-16 – 2012-06-18 (×4): 100 mL/h via INTRAVENOUS
  Filled 2012-06-16 (×15): qty 1000

## 2012-06-16 MED ORDER — METHOCARBAMOL 100 MG/ML IJ SOLN
500.0000 mg | Freq: Four times a day (QID) | INTRAVENOUS | Status: DC | PRN
  Administered 2012-06-17: 500 mg via INTRAVENOUS
  Filled 2012-06-16 (×2): qty 5

## 2012-06-16 MED ORDER — ONDANSETRON HCL 4 MG PO TABS
4.0000 mg | ORAL_TABLET | Freq: Four times a day (QID) | ORAL | Status: DC | PRN
  Administered 2012-06-18: 4 mg via ORAL
  Filled 2012-06-16 (×2): qty 1

## 2012-06-16 MED ORDER — PANTOPRAZOLE SODIUM 40 MG PO TBEC
40.0000 mg | DELAYED_RELEASE_TABLET | Freq: Every day | ORAL | Status: DC
  Administered 2012-06-17 – 2012-06-19 (×3): 40 mg via ORAL
  Filled 2012-06-16 (×4): qty 1

## 2012-06-16 MED ORDER — HYDROMORPHONE HCL PF 1 MG/ML IJ SOLN
0.5000 mg | INTRAMUSCULAR | Status: DC | PRN
  Administered 2012-06-16: 1 mg via INTRAVENOUS
  Administered 2012-06-17 (×2): 0.5 mg via INTRAVENOUS
  Administered 2012-06-17: 1 mg via INTRAVENOUS
  Administered 2012-06-17 – 2012-06-18 (×2): 0.5 mg via INTRAVENOUS
  Administered 2012-06-18: 1 mg via INTRAVENOUS
  Administered 2012-06-18: 0.5 mg via INTRAVENOUS
  Administered 2012-06-19: 1 mg via INTRAVENOUS
  Filled 2012-06-16 (×9): qty 1

## 2012-06-16 MED ORDER — BIOTENE DRY MOUTH MT LIQD
15.0000 mL | Freq: Two times a day (BID) | OROMUCOSAL | Status: DC
  Administered 2012-06-17 – 2012-06-19 (×5): 15 mL via OROMUCOSAL

## 2012-06-16 MED ORDER — PROMETHAZINE HCL 12.5 MG PO TABS
12.5000 mg | ORAL_TABLET | Freq: Four times a day (QID) | ORAL | Status: DC | PRN
  Filled 2012-06-16: qty 2

## 2012-06-16 MED ORDER — FENTANYL CITRATE 0.05 MG/ML IJ SOLN
INTRAMUSCULAR | Status: DC | PRN
  Administered 2012-06-16 (×3): 50 ug via INTRAVENOUS

## 2012-06-16 MED ORDER — TRAMADOL HCL 50 MG PO TABS
50.0000 mg | ORAL_TABLET | Freq: Four times a day (QID) | ORAL | Status: DC | PRN
  Administered 2012-06-17: 100 mg via ORAL
  Administered 2012-06-17: 50 mg via ORAL
  Filled 2012-06-16 (×2): qty 2

## 2012-06-16 MED ORDER — LABETALOL HCL 5 MG/ML IV SOLN
INTRAVENOUS | Status: DC | PRN
  Administered 2012-06-16: 2.5 mg via INTRAVENOUS

## 2012-06-16 MED ORDER — AMIODARONE IV BOLUS ONLY 150 MG/100ML
150.0000 mg | Freq: Once | INTRAVENOUS | Status: DC
  Filled 2012-06-16: qty 100

## 2012-06-16 MED ORDER — DILTIAZEM LOAD VIA INFUSION
10.0000 mg | Freq: Once | INTRAVENOUS | Status: AC
  Administered 2012-06-16: 10 mg via INTRAVENOUS
  Filled 2012-06-16: qty 10

## 2012-06-16 MED ORDER — AMIODARONE LOAD VIA INFUSION
150.0000 mg | Freq: Once | INTRAVENOUS | Status: DC
  Filled 2012-06-16: qty 83.34

## 2012-06-16 MED ORDER — ETOMIDATE 2 MG/ML IV SOLN
INTRAVENOUS | Status: DC | PRN
  Administered 2012-06-16: 11 mg via INTRAVENOUS

## 2012-06-16 MED ORDER — HYDROCODONE-ACETAMINOPHEN 5-325 MG PO TABS
1.0000 | ORAL_TABLET | ORAL | Status: DC | PRN
  Filled 2012-06-16: qty 1

## 2012-06-16 MED ORDER — DIPHENHYDRAMINE HCL 25 MG PO CAPS
25.0000 mg | ORAL_CAPSULE | Freq: Four times a day (QID) | ORAL | Status: DC | PRN
  Filled 2012-06-16: qty 1

## 2012-06-16 MED ORDER — MENTHOL 3 MG MT LOZG
1.0000 | LOZENGE | OROMUCOSAL | Status: DC | PRN
  Filled 2012-06-16: qty 9

## 2012-06-16 MED ORDER — HYDROMORPHONE HCL PF 1 MG/ML IJ SOLN
INTRAMUSCULAR | Status: AC
  Filled 2012-06-16: qty 1

## 2012-06-16 MED ORDER — PROMETHAZINE HCL 12.5 MG RE SUPP
12.5000 mg | Freq: Four times a day (QID) | RECTAL | Status: DC | PRN
  Filled 2012-06-16: qty 2

## 2012-06-16 MED ORDER — FUROSEMIDE 10 MG/ML IJ SOLN
INTRAMUSCULAR | Status: AC
  Administered 2012-06-16: 10 mg via INTRAVENOUS
  Filled 2012-06-16: qty 2

## 2012-06-16 MED ORDER — 0.9 % SODIUM CHLORIDE (POUR BTL) OPTIME
TOPICAL | Status: DC | PRN
  Administered 2012-06-16: 1000 mL

## 2012-06-16 MED ORDER — ZOLPIDEM TARTRATE 5 MG PO TABS: 5.0000 mg | ORAL_TABLET | Freq: Every evening | ORAL | Status: DC | PRN

## 2012-06-16 MED ORDER — POLYETHYLENE GLYCOL 3350 17 G PO PACK
17.0000 g | PACK | Freq: Two times a day (BID) | ORAL | Status: DC
  Administered 2012-06-17 – 2012-06-19 (×5): 17 g via ORAL
  Filled 2012-06-16 (×9): qty 1

## 2012-06-16 MED ORDER — BISACODYL 10 MG RE SUPP
10.0000 mg | Freq: Every day | RECTAL | Status: DC | PRN
  Filled 2012-06-16: qty 1

## 2012-06-16 MED ORDER — METOPROLOL SUCCINATE ER 50 MG PO TB24
50.0000 mg | ORAL_TABLET | Freq: Two times a day (BID) | ORAL | Status: DC
  Administered 2012-06-17 – 2012-06-19 (×5): 50 mg via ORAL
  Filled 2012-06-16 (×9): qty 1

## 2012-06-16 MED ORDER — FLEET ENEMA 7-19 GM/118ML RE ENEM
1.0000 | ENEMA | Freq: Once | RECTAL | Status: AC | PRN
  Filled 2012-06-16: qty 1

## 2012-06-16 MED ORDER — CEFAZOLIN SODIUM-DEXTROSE 2-3 GM-% IV SOLR
INTRAVENOUS | Status: AC
  Filled 2012-06-16: qty 50

## 2012-06-16 MED ORDER — ONDANSETRON HCL 4 MG/2ML IJ SOLN
INTRAMUSCULAR | Status: AC
  Filled 2012-06-16: qty 2

## 2012-06-16 MED ORDER — METOCLOPRAMIDE HCL 5 MG PO TABS
5.0000 mg | ORAL_TABLET | Freq: Three times a day (TID) | ORAL | Status: DC | PRN
  Filled 2012-06-16: qty 2

## 2012-06-16 MED ORDER — DABIGATRAN ETEXILATE MESYLATE 75 MG PO CAPS
75.0000 mg | ORAL_CAPSULE | Freq: Two times a day (BID) | ORAL | Status: DC
  Administered 2012-06-17 – 2012-06-19 (×5): 75 mg via ORAL
  Filled 2012-06-16 (×7): qty 1

## 2012-06-16 MED ORDER — CHLORHEXIDINE GLUCONATE 4 % EX LIQD
60.0000 mL | Freq: Once | CUTANEOUS | Status: DC
  Filled 2012-06-16: qty 60

## 2012-06-16 MED ORDER — METHOCARBAMOL 500 MG PO TABS
500.0000 mg | ORAL_TABLET | Freq: Four times a day (QID) | ORAL | Status: DC | PRN
  Administered 2012-06-17: 500 mg via ORAL
  Filled 2012-06-16: qty 1

## 2012-06-16 MED ORDER — PROMETHAZINE HCL 25 MG/ML IJ SOLN
12.5000 mg | Freq: Four times a day (QID) | INTRAMUSCULAR | Status: DC | PRN
  Administered 2012-06-16: 12.5 mg via INTRAVENOUS
  Filled 2012-06-16: qty 1

## 2012-06-16 MED ORDER — PROMETHAZINE HCL 25 MG/ML IJ SOLN: 6.2500 mg | INTRAMUSCULAR | Status: DC | PRN

## 2012-06-16 MED ORDER — DOCUSATE SODIUM 100 MG PO CAPS
100.0000 mg | ORAL_CAPSULE | Freq: Two times a day (BID) | ORAL | Status: DC
  Administered 2012-06-17 – 2012-06-19 (×5): 100 mg via ORAL
  Filled 2012-06-16 (×9): qty 1

## 2012-06-16 MED ORDER — ONDANSETRON HCL 4 MG/2ML IJ SOLN
4.0000 mg | Freq: Four times a day (QID) | INTRAMUSCULAR | Status: DC | PRN
  Administered 2012-06-16 (×2): 4 mg via INTRAVENOUS
  Filled 2012-06-16: qty 2

## 2012-06-16 MED ORDER — HYDROMORPHONE HCL PF 1 MG/ML IJ SOLN
0.2500 mg | INTRAMUSCULAR | Status: DC | PRN
  Administered 2012-06-16: 0.5 mg via INTRAVENOUS
  Administered 2012-06-16 (×2): 0.25 mg via INTRAVENOUS

## 2012-06-16 MED ORDER — LINAGLIPTIN 5 MG PO TABS
5.0000 mg | ORAL_TABLET | Freq: Every day | ORAL | Status: DC
  Administered 2012-06-17 – 2012-06-19 (×3): 5 mg via ORAL
  Filled 2012-06-16 (×4): qty 1

## 2012-06-16 MED ORDER — ACETAMINOPHEN 10 MG/ML IV SOLN
INTRAVENOUS | Status: AC
  Filled 2012-06-16: qty 100

## 2012-06-16 MED ORDER — DEXAMETHASONE SODIUM PHOSPHATE 10 MG/ML IJ SOLN
INTRAMUSCULAR | Status: DC | PRN
  Administered 2012-06-16: 10 mg via INTRAVENOUS

## 2012-06-16 MED ORDER — FERROUS SULFATE 325 (65 FE) MG PO TABS
325.0000 mg | ORAL_TABLET | Freq: Three times a day (TID) | ORAL | Status: DC
  Administered 2012-06-17 – 2012-06-19 (×7): 325 mg via ORAL
  Filled 2012-06-16 (×12): qty 1

## 2012-06-16 MED ORDER — CEFAZOLIN SODIUM-DEXTROSE 2-3 GM-% IV SOLR
2.0000 g | Freq: Four times a day (QID) | INTRAVENOUS | Status: AC
  Administered 2012-06-16 (×2): 2 g via INTRAVENOUS
  Filled 2012-06-16 (×3): qty 50

## 2012-06-16 MED ORDER — ALPRAZOLAM 0.25 MG PO TABS
0.2500 mg | ORAL_TABLET | Freq: Every day | ORAL | Status: DC
  Administered 2012-06-17 – 2012-06-19 (×3): 0.25 mg via ORAL
  Filled 2012-06-16 (×3): qty 1

## 2012-06-16 MED ORDER — ROCURONIUM BROMIDE 100 MG/10ML IV SOLN
INTRAVENOUS | Status: DC | PRN
  Administered 2012-06-16: 40 mg via INTRAVENOUS

## 2012-06-16 MED ORDER — DEXAMETHASONE SODIUM PHOSPHATE 10 MG/ML IJ SOLN
10.0000 mg | Freq: Once | INTRAMUSCULAR | Status: DC
  Filled 2012-06-16: qty 1

## 2012-06-16 MED ORDER — PHENYLEPHRINE HCL 10 MG/ML IJ SOLN
INTRAMUSCULAR | Status: DC | PRN
  Administered 2012-06-16 (×2): 60 ug via INTRAVENOUS

## 2012-06-16 SURGICAL SUPPLY — 44 items
BAG ZIPLOCK 12X15 (MISCELLANEOUS) ×2 IMPLANT
BLADE SAW SGTL 18X1.27X75 (BLADE) ×2 IMPLANT
CLOTH BEACON ORANGE TIMEOUT ST (SAFETY) ×2 IMPLANT
DERMABOND ADVANCED (GAUZE/BANDAGES/DRESSINGS) ×1
DERMABOND ADVANCED .7 DNX12 (GAUZE/BANDAGES/DRESSINGS) ×1 IMPLANT
DRAPE INCISE IOBAN 85X60 (DRAPES) ×2 IMPLANT
DRAPE ORTHO SPLIT 77X108 STRL (DRAPES) ×2
DRAPE POUCH INSTRU U-SHP 10X18 (DRAPES) ×2 IMPLANT
DRAPE SURG 17X11 SM STRL (DRAPES) ×2 IMPLANT
DRAPE SURG ORHT 6 SPLT 77X108 (DRAPES) ×2 IMPLANT
DRAPE U-SHAPE 47X51 STRL (DRAPES) ×2 IMPLANT
DRSG AQUACEL AG ADV 3.5X10 (GAUZE/BANDAGES/DRESSINGS) ×2 IMPLANT
DRSG TEGADERM 2-3/8X2-3/4 SM (GAUZE/BANDAGES/DRESSINGS) ×2 IMPLANT
DRSG TEGADERM 4X4.75 (GAUZE/BANDAGES/DRESSINGS) ×2 IMPLANT
DURAPREP 26ML APPLICATOR (WOUND CARE) ×2 IMPLANT
ELECT BLADE TIP CTD 4 INCH (ELECTRODE) ×2 IMPLANT
ELECT REM PT RETURN 9FT ADLT (ELECTROSURGICAL) ×2
ELECTRODE REM PT RTRN 9FT ADLT (ELECTROSURGICAL) ×1 IMPLANT
EVACUATOR 1/8 PVC DRAIN (DRAIN) ×2 IMPLANT
FACESHIELD LNG OPTICON STERILE (SAFETY) ×8 IMPLANT
GAUZE SPONGE 2X2 8PLY STRL LF (GAUZE/BANDAGES/DRESSINGS) ×1 IMPLANT
GLOVE BIOGEL PI IND STRL 7.5 (GLOVE) ×1 IMPLANT
GLOVE BIOGEL PI IND STRL 8 (GLOVE) ×1 IMPLANT
GLOVE BIOGEL PI INDICATOR 7.5 (GLOVE) ×1
GLOVE BIOGEL PI INDICATOR 8 (GLOVE) ×1
GLOVE ECLIPSE 8.0 STRL XLNG CF (GLOVE) ×2 IMPLANT
GLOVE ORTHO TXT STRL SZ7.5 (GLOVE) ×4 IMPLANT
GOWN BRE IMP PREV XXLGXLNG (GOWN DISPOSABLE) ×4 IMPLANT
GOWN STRL NON-REIN LRG LVL3 (GOWN DISPOSABLE) ×2 IMPLANT
KIT BASIN OR (CUSTOM PROCEDURE TRAY) ×2 IMPLANT
MANIFOLD NEPTUNE II (INSTRUMENTS) ×2 IMPLANT
NS IRRIG 1000ML POUR BTL (IV SOLUTION) ×2 IMPLANT
PACK TOTAL JOINT (CUSTOM PROCEDURE TRAY) ×2 IMPLANT
POSITIONER SURGICAL ARM (MISCELLANEOUS) ×2 IMPLANT
SPONGE GAUZE 2X2 STER 10/PKG (GAUZE/BANDAGES/DRESSINGS) ×1
SUCTION FRAZIER TIP 10 FR DISP (SUCTIONS) ×2 IMPLANT
SUT MNCRL AB 4-0 PS2 18 (SUTURE) ×2 IMPLANT
SUT VIC AB 1 CT1 36 (SUTURE) ×6 IMPLANT
SUT VIC AB 2-0 CT1 27 (SUTURE) ×2
SUT VIC AB 2-0 CT1 TAPERPNT 27 (SUTURE) ×2 IMPLANT
SUT VLOC 180 0 24IN GS25 (SUTURE) ×4 IMPLANT
TOWEL OR 17X26 10 PK STRL BLUE (TOWEL DISPOSABLE) ×4 IMPLANT
TRAY FOLEY CATH 14FRSI W/METER (CATHETERS) ×2 IMPLANT
WATER STERILE IRR 1500ML POUR (IV SOLUTION) ×2 IMPLANT

## 2012-06-16 NOTE — Transfer of Care (Signed)
Immediate Anesthesia Transfer of Care Note  Patient: Sophia Oliver  Procedure(s) Performed: Procedure(s) (LRB): TOTAL HIP ARTHROPLASTY (Left)  Patient Location: PACU  Anesthesia Type: General  Level of Consciousness: awake, sedated and patient cooperative  Airway & Oxygen Therapy: Patient Spontanous Breathing and Patient connected to face mask oxygen  Post-op Assessment: Report given to PACU RN and Post -op Vital signs reviewed and stable  Post vital signs: Reviewed and stable  Complications: No apparent anesthesia complications

## 2012-06-16 NOTE — Op Note (Signed)
NAME:Sophia Oliver  MEDICAL RECORD ZO.:[id number]   LOCATION: 1602 FACILITY: [facility]   DATE OF BIRTH: 1924-01-24  PHYSICIAN: Madlyn Frankel. Charlann Boxer, M.D.    DATE OF PROCEDURE: 06/16/2012     OPERATIVE REPORT:   PREOPERATIVE DIAGNOSIS: Left hip osteoarthritis.   POSTOPERATIVE DIAGNOSIS: Left hip osteoarthritis with significant arthritic damage to the acetabulum  PROCEDURE: Left total hip replacement utilizing DePuy components size  50mm Pinnacle cup, 32+4 neutral Altrx liner, a size 8 standard Tri-Lock stem  with a 32+9 metal ball.   SURGEON: Madlyn Frankel. Charlann Boxer, M.D.   ASSISTANT: Lanney Gins, PA-C.  Please note that physician assistant, Lanney Gins, was present for the entire case from  preoperative positioning to perioperative retractive management, general  facilitation and management of the operative extremity during the case  as well as general facilitation of the case and primary wound closure.   ANESTHESIA: GET   SPECIMENS: None.   COMPLICATIONS: None.   ESTIMATED BLOOD LOSS: 300cc.   DRAINS: One Hemovac.   INDICATION FOR PROCEDURE: The patient is a 78 y.o. female patient of mine  with history of lrft hip osteoarthritis.  The patient had progressive worsening of left hip  pain. They had failed conservative measures including medications, activity modification, and consideration of injections.  After reviewing the  risks infection, DVT, dislocation, and need for revision surgeries, consent was  obtained for benefit of pain relief.   PROCEDURE IN DETAIL: The patient was brought to the operative theater.  Once adequate anesthesia, preop antibiotics, 2gm Ancef administered, the  patient was positioned in the right lateral decubitus position with the  left side up using the peg board positioner.  The left lower extremity was prepped and draped in  sterile fashion.  Time-out was performed identifying the patient,  planned procedure, and extremity.   A lateral incision was  made based off the proximal trochanter. Sharp dissection was carried down to the level of the iliotibial band and gluteal fascia.  The iliotibial band and gluteal fascia were then opened through a  posterior approach in the hip.  The posterior aspect of the hip was exposed. The short external  rotators were taken down separate from the posterior capsule which was  preserved to help protect the sciatic nerve from retractors, and also to be potentially repaired at the end of the procedure. Following this, an L capsulotomy was made referenced off the superior femoral neck.  The hip was then dislocated. A femoral neck osteotomy was made based on anatomic landmarks and pre-operative templating.  The femoral head was removed and advanced degenerative changes were noted on both the femoral head and acetabulum.   Femoral preparation was begun, retractors were placed and the proximal  femur was opened with the starting drill, I then hand reamed once and irrigated to  try to prevent fat emboli.  I began broaching with 0 broach and broached up initially to a size 8  broach. I packed off the femur with a sponge and then directed my attention to the acetabulum.  Acetabular retractors were placed. The labrum and foveal tissue was debrided. In  addition, there was a portion of the superior capsule removed to allow  for exposure of the cup.  I then began reaming with a 45 reamer and reamed up  to a 49 reamer with fairly good bone preparation other than the erosion of the superior actebular wall.  A 50mm Pinnacle cup was  chosen. The cup was subsequently impacted with good initial  scratch fit fixation.  3 screws were placed.  The hole eliminator was placed and the final 32+4 liner was impacted  with good visualized rim fit.   Please note that I did assess the cup position using the anatomic  landmarks present including acetabular rim as well as my hip guide  indicating position about 20 degrees of forward  flexion and 35-40  degrees of abduction.   At this point, I turned to the femur. The size 8 broach was placed in the femur.  Based on the pre-operative radiographs and templating, I chose a standard  offset neck. A trial reduction was now carried out with a standard offset  neck and trialed with various head sizes ultimately choosing the +9 ball. On inspection of range of motion, there was no  evidence of impingement or subluxation. Leg lengths appeared to be  equal to the contralateral leg as I compare them to the preoperative  state. Given all these findings, the trial components were removed.  The final 8 standard Tri-Lock stem was chosen. After irrigating the femoral  canal, the final stem was impacted and sat at the level of the final broach. Based on this and the trial reduction, a 32+9 metal ball was chosen, impacted onto a clean dry trunnion.   At this point, the hip was reduced. The hip had been irrigated  throughout the case and again at this point. We reapproximated the capsule to itself using a #1 vicryl.  I placed a medium Hemovac drain deep. The iliotibial band and gluteal  fascia were then reapproximated using #1 Vicryl. The remaining wound  was closed with 2-0 Vicryl and running 4-0 Monocryl.   The hip was cleaned, dried, and dressed sterilely using Dermabond and Aquacel  dressing. The patient's drain site dressed separately. The patient was  brought to the recovery room in stable condition, tolerating the  procedure well.     Madlyn Frankel Charlann Boxer, M.D.

## 2012-06-16 NOTE — OR Nursing (Signed)
PACU Nsg Note: Pt arrived into PACU, immediately connected to cardiac monitor, pt noted to be A-Fib w/ RVR rate at 140, NBP 162/90, MDA immediately paged to bedside, [employer] Pacer Rep paged, 12 lead EKG done STAT, orders rec'd for Cardizem gtt w/ bolus, MD (Ortho) notified of incident

## 2012-06-16 NOTE — Anesthesia Postprocedure Evaluation (Addendum)
  Anesthesia Post-op Note  Patient: Sophia Oliver  Procedure(s) Performed: Procedure(s) (LRB): TOTAL HIP ARTHROPLASTY (Left)  Patient Location: PACU  Anesthesia Type: General  Level of Consciousness: awake and alert   Airway and Oxygen Therapy: Patient Spontanous Breathing  Post-op Pain: mild  Post-op Assessment: Post-op Vital signs reviewed, Patient's Cardiovascular Status Stable, Respiratory Function Stable, Patent Airway and No signs of Nausea or vomiting  Post-op Vital Signs: stable  Complications: She experienced tachycardia intraoperatively which did not respond to low dose beta blocker, but which did respond to magnet on pacemaker (brought her rate down to 100). In PACU, she denied complaint except hip pain. Her heart rate again increased to 140 and did not respond to pacemaker magnet, but did improve somewhat with labetalol. The device representative from [employer] came and checked her pacemaker. Dr. Charlann Boxer called Dr. Sharyn Lull who recommended Amiodarone. Her heart rate came down with Amiodarone. She will be sent to step-down unit for monitoring. CXR consistent with moderate pulmonary edema. Lasix ordered. Oxygen saturation OK.

## 2012-06-16 NOTE — Interval H&P Note (Signed)
History and Physical Interval Note:  06/16/2012 10:45 AM  Sophia Oliver  has presented today for surgery, with the diagnosis of Left Hip Osteoarthritis  The various methods of treatment have been discussed with the patient and family. After consideration of risks, benefits and other options for treatment, the patient has consented to  Procedure(s) (LRB): LEFT TOTAL HIP ARTHROPLASTY (Left) as a surgical intervention .  The patient's history has been reviewed, patient examined, no change in status, stable for surgery.  I have reviewed the patient's chart and labs.  Questions were answered to the patient's satisfaction.     [name]

## 2012-06-16 NOTE — Consult Note (Signed)
Reason for Consult: Atrial fibrillation with rapid ventricular response Referring Physician: Dr. Iris Pert is an 78 y.o. female.  HPI: Patient is 78 year old female with past medical history significant for multiple medical problems i.e. tachy/bradycardia syndrome status post permanent pacemaker, chronic atrial fibrillation, hypertension, chronic kidney disease stage II, GERD, degenerative joint disease, history of CA of breast, history of syncope in the past, underwent left hip arthroplasty today postoperatively the patient developed atrial fibrillation with rapid ventricular response received initially Lopressor and subsequently IV amiodarone with control of heart rate in 100s and Lasix with good diuresis and presently being started on Cardizem drip. Patient denies any chest pain complains of nausea. Patient denies any palpitation lightheadedness or syncope. Patient denies any shortness of breath patient denies any palpitation lightheadedness. Denies fever or chills. Patient denies any complaints except for nausea.  Past Medical History  Diagnosis Date  . Syncope   . Sinus node dysfunction   . S/P cardiac pacemaker procedure     [employer]  . Wide-complex tachycardia   . Cholelithiasis   . Glucose intolerance (impaired glucose tolerance)   . DJD (degenerative joint disease)   . Anxiety   . PONV (postoperative nausea and vomiting)   . Pacemaker   . Chronic kidney disease     frequency  . Breast cancer     left breast - hx of 2003   . Injury of right upper arm     after venipuncture in 3/13 - pt describes pain afterwards- no followup done seen by PCP- no further treatment     Past Surgical History  Procedure Date  . S/p pacemaker   . Replacement total knee bilateral   . Total shoulder replacement   . Total hip arthroplasty   . Femur fracture surgery   . Hysterectomy, unspecified area   . Masterectomy   . Abdominal hysterectomy   . Breast surgery     left breast - 2003      History reviewed. No pertinent family history.  Social History:  reports that she quit smoking about 63 years ago. She has never used smokeless tobacco. She reports that she does not drink alcohol or use illicit drugs.  Allergies:  Allergies  Allergen Reactions  . Codeine Nausea And Vomiting  . Other Nausea And Vomiting    Narcotics  dizziness    Medications: I have reviewed the patient's current medications.  Results for orders placed during the hospital encounter of 06/16/12 (from the past 48 hour(s))  PROTIME-INR     Status: Normal   Collection Time   06/16/12 10:20 AM      Component Value Range Comment   Prothrombin Time 13.1  11.6 - 15.2 seconds    INR 0.97  0.00 - 1.49   GLUCOSE, CAPILLARY     Status: Normal   Collection Time   06/16/12 10:23 AM      Component Value Range Comment   Glucose-Capillary 95  70 - 99 mg/dL    Comment 1 Documented in Chart     TYPE AND SCREEN     Status: Normal (Preliminary result)   Collection Time   06/16/12 10:30 AM      Component Value Range Comment   ABO/RH(D) A POS      Antibody Screen POS      Sample Expiration 06/19/2012      Antibody Identification ANTI-Jka (KIDD a)      DAT, IgG NEG      Unit Number [id number]  Blood Component Type RED CELLS,LR      Unit division 00      Status of Unit ALLOCATED      Donor AG Type NEGATIVE FOR KIDD A ANTIGEN      Transfusion Status OK TO TRANSFUSE      Crossmatch Result COMPATIBLE      Unit Number [id number]      Blood Component Type RED CELLS,LR      Unit division 00      Status of Unit ALLOCATED      Donor AG Type NEGATIVE FOR KIDD A ANTIGEN      Transfusion Status OK TO TRANSFUSE      Crossmatch Result COMPATIBLE       Dg Pelvis Portable  06/16/2012  *RADIOLOGY REPORT*  Clinical Data: Left hip replacement.  PORTABLE PELVIS  Comparison: CT abdomen and pelvis 01/23/2012 and plain films left hip 03/12/2012.  Findings: The patient has a new left total hip replacement.  The device is  located and no fracture is identified.  Gas in the soft tissues and surgical drain are noted.  IMPRESSION: Left total hip replacement without evidence of complication.  Original Report Authenticated By: Bernadene Bell. Maricela Curet, M.D.   Dg Chest Port 1 View  06/16/2012  *RADIOLOGY REPORT*  Clinical Data: Postoperative arrhythmia.  PORTABLE CHEST - 1 VIEW  Comparison: Chest x-ray 03/22/2012.  Findings: There is cephalization of the pulmonary vasculature, indistinctness of the interstitial markings, and patchy airspace disease throughout the lungs bilaterally suggestive of moderate pulmonary edema.  No definite pleural effusions.  Mild cardiomegaly. The patient is rotated to the right on today's exam, resulting in distortion of the mediastinal contours and reduced diagnostic sensitivity and specificity for mediastinal pathology. Atherosclerosis in the thoracic aorta.  Right-sided pacemaker device in place with lead tips projecting over the expected location of the right atrium and right ventricular apex.  Status post right shoulder hemiarthroplasty.  IMPRESSION: 1.  The appearance of the chest suggests congestive heart failure, as above. 2.  Postoperative changes and support apparatus, as above.  Original Report Authenticated By: Florencia Reasons, M.D.   Dg Hip Portable 1 View Left  06/16/2012  *RADIOLOGY REPORT*  Clinical Data: Status post left hip arthroplasty  PORTABLE LEFT HIP - 1 VIEW  Comparison: 03/12/2012  Findings: Cross-table lateral view of the left hip shows a new left hip prosthesis that appears well seated and well-aligned.  No evidence of acute fracture or operative complication.  Original Report Authenticated By: Domenic Moras, M.D.    Review of Systems  Constitutional: Negative for fever and chills.  Eyes: Negative for blurred vision and double vision.  Respiratory: Negative for cough, hemoptysis and sputum production.   Cardiovascular: Negative for chest pain, palpitations, orthopnea and  leg swelling.  Gastrointestinal: Positive for nausea. Negative for vomiting and abdominal pain.  Neurological: Negative for dizziness and headaches.   Blood pressure 131/55, pulse 55, temperature 97 F (36.1 C), resp. rate 18, SpO2 100.00%. Physical Exam  Constitutional: She is oriented to person, place, and time.  HENT:  Head: Normocephalic and atraumatic.  Nose: Nose normal.  Mouth/Throat: No oropharyngeal exudate.  Eyes: Conjunctivae are normal. Left eye exhibits no discharge. No scleral icterus.  Neck: Normal range of motion. Neck supple. No JVD present. No tracheal deviation present.  Cardiovascular:       Irregularly irregular S1 and S2 soft Soft systolic murmur and S3 gallop noted  Respiratory:       Clear to auscultation anterolaterally  no wheezing or rales noted decreased breath sound at bases  GI: Soft. Bowel sounds are normal. She exhibits no distension. There is no tenderness. There is no rebound.  Musculoskeletal: She exhibits no edema and no tenderness.       Surgical site dry drainage catheter draining bloody fluid  Neurological: She is alert and oriented to person, place, and time.    Assessment/Plan: Chronic A. fib with a moderate ventricular response improved Mild congestive heart failure secondary to volume overload and tachycardia improved Status post left hip arthroplasty stable Mild coronary artery disease Hypertension Sick sinus syndrome Tachybradycardia syndrome status post permanent pacemaker Chronic kidney disease stage II GERD History of CA of breast Degenerative joint disease Plan Agree with Lasix Continue Cardizem IV for rate control for now. We will restart metoprolol tartrate from tomorrow Agree with starting pradaxa from tomorrow Check labs in a.m. Sophia Oliver 06/16/2012, 5:53 PM

## 2012-06-16 NOTE — Anesthesia Preprocedure Evaluation (Addendum)
Anesthesia Evaluation  Patient identified by MRN, date of birth, ID band Patient awake    Reviewed: Allergy & Precautions, H&P , NPO status , Patient's Chart, lab work & pertinent test results  History of Anesthesia Complications (+) PONV  Airway Mallampati: II TM Distance: >3 FB Neck ROM: Full    Dental No notable dental hx.    Pulmonary neg pulmonary ROS,  breath sounds clear to auscultation  Pulmonary exam normal       Cardiovascular negative cardio ROS  + dysrhythmias Atrial Fibrillation + pacemaker Rhythm:Regular Rate:Normal  ECG and CXR reviewed. [employer] pacemaker for sinus node dysfunction.   Neuro/Psych Anxiety Syncope    GI/Hepatic negative GI ROS, Neg liver ROS,   Endo/Other  negative endocrine ROS  Renal/GU Renal diseaseFrequency. Creatinine elevated at 1.33  negative genitourinary   Musculoskeletal negative musculoskeletal ROS (+)   Abdominal   Peds negative pediatric ROS (+)  Hematology negative hematology ROS (+)   Anesthesia Other Findings   Reproductive/Obstetrics negative OB ROS                          Anesthesia Physical Anesthesia Plan  ASA: III  Anesthesia Plan: General   Post-op Pain Management:    Induction: Intravenous  Airway Management Planned: Oral ETT  Additional Equipment:   Intra-op Plan:   Post-operative Plan: Extubation in OR  Informed Consent: I have reviewed the patients History and Physical, chart, labs and discussed the procedure including the risks, benefits and alternatives for the proposed anesthesia with the patient or authorized representative who has indicated his/her understanding and acceptance.   Dental advisory given  Plan Discussed with: CRNA  Anesthesia Plan Comments: (Discussed r/b/a general versus spinal. Off pradaxa five days, renal insufficiency. Plan general)       Anesthesia Quick Evaluation

## 2012-06-17 ENCOUNTER — Encounter (HOSPITAL_COMMUNITY): Payer: Self-pay | Admitting: Orthopedic Surgery

## 2012-06-17 LAB — CBC
HCT: 28 % — ABNORMAL LOW (ref 36.0–46.0)
Hemoglobin: 9.4 g/dL — ABNORMAL LOW (ref 12.0–15.0)
RBC: 2.91 MIL/uL — ABNORMAL LOW (ref 3.87–5.11)
WBC: 10.1 10*3/uL (ref 4.0–10.5)

## 2012-06-17 LAB — BASIC METABOLIC PANEL
BUN: 29 mg/dL — ABNORMAL HIGH (ref 6–23)
CO2: 18 mEq/L — ABNORMAL LOW (ref 19–32)
Chloride: 106 mEq/L (ref 96–112)
Glucose, Bld: 169 mg/dL — ABNORMAL HIGH (ref 70–99)
Potassium: 5 mEq/L (ref 3.5–5.1)
Sodium: 140 mEq/L (ref 135–145)

## 2012-06-17 MED ORDER — DIGOXIN 0.25 MG/ML IJ SOLN
0.1250 mg | Freq: Every day | INTRAMUSCULAR | Status: DC
Start: 2012-06-18 — End: 2012-06-18
  Filled 2012-06-17 (×2): qty 0.5

## 2012-06-17 MED ORDER — DIGOXIN 0.25 MG/ML IJ SOLN
0.2500 mg | Freq: Once | INTRAMUSCULAR | Status: AC
  Administered 2012-06-17: 0.25 mg via INTRAVENOUS
  Filled 2012-06-17: qty 1

## 2012-06-17 NOTE — Clinical Social Work Psychosocial (Signed)
Clinical Social Work Department BRIEF PSYCHOSOCIAL ASSESSMENT 06/17/2012  Patient:  Sophia Oliver, Sophia Oliver     Account Number:  [id number]     Admit date:  06/16/2012  Clinical Social Worker:  Jodelle Red  Date/Time:  06/17/2012 12:03 PM  Referred by:  Physician  Date Referred:  06/16/2012 Referred for  SNF Placement   Other Referral:   Interview type:  Patient Other interview type:   CHART REVIEW, DISCUSSION WITH RN    PSYCHOSOCIAL DATA Living Status:  HUSBAND Admitted from facility:   Level of care:   Primary support name:  Crissie Reese Primary support relationship to patient:  SPOUSE Degree of support available:   GOOD FROM HUSBAND, PER Pt. son is also very helpful- Public affairs consultant    CURRENT CONCERNS Current Concerns  Post-Acute Placement   Other Concerns:    SOCIAL WORK ASSESSMENT / PLAN CSW met with Pt at the bedside due to MD order for possible SNF after hip surgery. Pt hopes to go to [facility] and already filled out paperwork for their facility per her report. She states she has been there before and ONLY wants to go there. She wants a private room if possible. She reports to be very knowledgeable of the SNF process. CSW to begin paperwork and process for SNF.   Assessment/plan status:  Psychosocial Support/Ongoing Assessment of Needs Other assessment/ plan:   SNF placement   Information/referral to community resources:   [facility]    PATIENT'S/FAMILY'S RESPONSE TO PLAN OF CARE: Pt agrees to SNF placement and coping well, has good support. CSW to facilitate SNF placement.    Doreen Salvage, LCSWA Clinical Social Worker [facility] Cell [phone]

## 2012-06-17 NOTE — Evaluation (Signed)
Occupational Therapy Evaluation Patient Details Name: Sophia Oliver MRN: [id number] DOB: 12-23-1923 Today's Date: 06/17/2012 Time: 2440-1027 OT Time Calculation (min): 18 min  OT Assessment / Plan / Recommendation Clinical Impression  Pt is an 78 yo female who presents s/p L THR. Skilled OT recommended to maximize independence with BADLs to min-mod A level in prep for d/c to next venue of care.    OT Assessment  Patient needs continued OT Services    Follow Up Recommendations  Skilled nursing facility    Barriers to Discharge Decreased caregiver support    Equipment Recommendations  Defer to next venue    Recommendations for Other Services    Frequency  Min 1X/week    Precautions / Restrictions Precautions Precautions: Fall;Posterior Hip Precaution Comments: Pt given handout Restrictions Weight Bearing Restrictions: Yes LLE Weight Bearing: Partial weight bearing LLE Partial Weight Bearing Percentage or Pounds: <50 %   Pertinent Vitals/Pain HR reached 125 during transfer. Pt very anxious with mobility.    ADL  Grooming: Simulated;Set up Where Assessed - Grooming: Supported sitting Lower Body Bathing: Simulated;+2 Total assistance Lower Body Bathing: Patient Percentage: 30% Where Assessed - Lower Body Bathing: Supported sit to stand Lower Body Dressing: Simulated;+2 Total assistance Lower Body Dressing: Patient Percentage: 30% Where Assessed - Lower Body Dressing: Supported sit to stand Toilet Transfer: Simulated;+2 Total assistance Toilet Transfer: Patient Percentage: 60% Statistician Method: Surveyor, minerals: Other (comment) (back to bed) Toileting - Clothing Manipulation and Hygiene: Simulated;+2 Total assistance Toileting - Clothing Manipulation and Hygiene: Patient Percentage: 30% Where Assessed - Toileting Clothing Manipulation and Hygiene: Standing ADL Comments: Pt very anxious and fearful of falling.    OT Diagnosis: Generalized  weakness  OT Problem List: Decreased strength;Decreased safety awareness;Decreased activity tolerance;Decreased knowledge of use of DME or AE;Pain OT Treatment Interventions: Self-care/ADL training;Therapeutic activities;DME and/or AE instruction;Patient/family education   OT Goals Acute Rehab OT Goals OT Goal Formulation: With patient Time For Goal Achievement: 07/01/12 Potential to Achieve Goals: Good ADL Goals Pt Will Perform Grooming: with set-up;Sitting, chair;Sitting, edge of bed;Unsupported ADL Goal: Grooming - Progress: Goal set today Pt Will Perform Lower Body Bathing: Sit to stand from chair;Sit to stand from bed;with min assist;with adaptive equipment ADL Goal: Lower Body Bathing - Progress: Goal set today Pt Will Perform Lower Body Dressing: with mod assist;Sit to stand from chair;Sit to stand from bed;with adaptive equipment ADL Goal: Lower Body Dressing - Progress: Goal set today Pt Will Transfer to Toilet: with min assist;3-in-1;Stand pivot transfer;Ambulation;Comfort height toilet;Maintaining weight bearing status;Maintaining hip precautions ADL Goal: Toilet Transfer - Progress: Goal set today Pt Will Perform Toileting - Clothing Manipulation: with mod assist;Standing ADL Goal: Toileting - Clothing Manipulation - Progress: Goal set today Pt Will Perform Toileting - Hygiene: with mod assist;Sit to stand from 3-in-1/toilet ADL Goal: Toileting - Hygiene - Progress: Goal set today  Visit Information  Last OT Received On: 06/17/12 Assistance Needed: +2    Subjective Data  Subjective: I'm afraid to move. Patient Stated Goal: Go to [facility] for st rehab.   Prior Functioning  Vision/Perception  Home Living Lives With: Spouse Available Help at Discharge: Skilled Nursing Facility Type of Home: House Home Layout: Two level;Bed/bath upstairs Home Adaptive Equipment: Walker - rolling;Straight cane Prior Function Level of Independence: Independent with assistive  device(s) Communication Communication: No difficulties Dominant Hand: Right      Cognition  Overall Cognitive Status: Appears within functional limits for tasks assessed/performed Arousal/Alertness: Awake/alert Orientation Level: Appears intact for  tasks assessed Behavior During Session: [facility] for tasks performed    Extremity/Trunk Assessment Right Upper Extremity Assessment RUE ROM/Strength/Tone: Deficits RUE ROM/Strength/Tone Deficits: generalized weakness RUE Sensation: Deficits RUE Sensation Deficits: generalized weakness    Mobility Bed Mobility Bed Mobility: Sit to Supine  Sit to Supine: 1: +2 Total assist Sit to Supine: Patient Percentage: 20% Details for Bed Mobility Assistance: Pt required physical A for trunk and LEs.  Transfers Sit to Stand: 1: +2 Total assist;With armrests;From chair/3-in-1 Sit to Stand: Patient Percentage: 60% Stand to Sit: 1: +2 Total assist;With upper extremity assist;To bed Stand to Sit: Patient Percentage: 60% Details for Transfer Assistance: Physical A needed to rise and stabilize self. Max vcs for hand placement and weight bearing precautions   Exercise    Balance    End of Session OT - End of Session Activity Tolerance: Patient limited by pain;Other (comment) (and anxiety) Patient left: in bed;with call bell/phone within reach  GO     Daemian Gahm A OTR/L [phone] 06/17/2012, 1:57 PM

## 2012-06-17 NOTE — Progress Notes (Signed)
Subjective:  Patient denies any chest pain or shortness of breath. Denies any palpitations. Paroxysmal atrial tachycardia on the monitor with occasional sinus rhythm  Objective:  Vital Signs in the last 24 hours: Temp:  [96.6 F (35.9 C)-100 F (37.8 C)] 98.9 F (37.2 C) (08/14 1200) Pulse Rate:  [55-112] 106  (08/14 1100) Resp:  [10-100] 100  (08/14 1127) BP: (91-152)/(48-73) 115/60 mmHg (08/14 1100) SpO2:  [20 %-100 %] 20 % (08/14 1127) Weight:  [56.8 kg (125 lb 3.5 oz)-57 kg (125 lb 10.6 oz)] 56.8 kg (125 lb 3.5 oz) (08/14 0000)  Intake/Output from previous day: 08/13 0701 - 08/14 0700 In: 3464.1 [I.V.:3305.1; IV Piggyback:159] Out: 1686 [Urine:950; Emesis/NG output:1; Drains:435; Blood:300] Intake/Output from this shift: Total I/O In: 400 [I.V.:400] Out: 200 [Urine:200]  Physical Exam: Neck: no adenopathy, no carotid bruit, no JVD and supple, symmetrical, trachea midline Lungs: Clear anterolaterally Heart: regular rate and rhythm, S1, S2 normal and Soft systolic murmur and S3 gallop noted Abdomen: soft, non-tender; bowel sounds normal; no masses,  no organomegaly Extremities: extremities normal, atraumatic, no cyanosis or edema and Surgical site dry drain removed  Lab Results:  Basename 06/17/12 0305  WBC 10.1  HGB 9.4*  PLT 206    Basename 06/17/12 0305  NA 140  K 5.0  CL 106  CO2 18*  GLUCOSE 169*  BUN 29*  CREATININE 1.23*   No results found for this basename: TROPONINI:2,CK,MB:2 in the last 72 hours Hepatic Function Panel No results found for this basename: PROT,ALBUMIN,AST,ALT,ALKPHOS,BILITOT,BILIDIR,IBILI in the last 72 hours No results found for this basename: CHOL in the last 72 hours No results found for this basename: PROTIME in the last 72 hours  Imaging: Imaging results have been reviewed and Dg Pelvis Portable  06/16/2012  *RADIOLOGY REPORT*  Clinical Data: Left hip replacement.  PORTABLE PELVIS  Comparison: CT abdomen and pelvis 01/23/2012 and  plain films left hip 03/12/2012.  Findings: The patient has a new left total hip replacement.  The device is located and no fracture is identified.  Gas in the soft tissues and surgical drain are noted.  IMPRESSION: Left total hip replacement without evidence of complication.  Original Report Authenticated By: Bernadene Bell. Maricela Curet, M.D.   Dg Chest Port 1 View  06/16/2012  *RADIOLOGY REPORT*  Clinical Data: Postoperative arrhythmia.  PORTABLE CHEST - 1 VIEW  Comparison: Chest x-ray 03/22/2012.  Findings: There is cephalization of the pulmonary vasculature, indistinctness of the interstitial markings, and patchy airspace disease throughout the lungs bilaterally suggestive of moderate pulmonary edema.  No definite pleural effusions.  Mild cardiomegaly. The patient is rotated to the right on today's exam, resulting in distortion of the mediastinal contours and reduced diagnostic sensitivity and specificity for mediastinal pathology. Atherosclerosis in the thoracic aorta.  Right-sided pacemaker device in place with lead tips projecting over the expected location of the right atrium and right ventricular apex.  Status post right shoulder hemiarthroplasty.  IMPRESSION: 1.  The appearance of the chest suggests congestive heart failure, as above. 2.  Postoperative changes and support apparatus, as above.  Original Report Authenticated By: Florencia Reasons, M.D.   Dg Hip Portable 1 View Left  06/16/2012  *RADIOLOGY REPORT*  Clinical Data: Status post left hip arthroplasty  PORTABLE LEFT HIP - 1 VIEW  Comparison: 03/12/2012  Findings: Cross-table lateral view of the left hip shows a new left hip prosthesis that appears well seated and well-aligned.  No evidence of acute fracture or operative complication.  Original Report Authenticated  By: Domenic Moras, M.D.    Cardiac Studies:  Assessment/Plan:  Paroxysmal atrial tachycardia Mild congestive heart failure secondary to volume overload and tachycardia improved    Status post left hip arthroplasty stable  Mild coronary artery disease  Hypertension  Sick sinus syndrome  Tachybradycardia syndrome status post permanent pacemaker  Chronic kidney disease stage II  GERD  History of CA of breast  Degenerative joint disease  Plan Agree with DC Cardizem and switching to Toprol We'll add low-dose digoxin  LOS: 1 day    Sulay Brymer N 06/17/2012, 2:57 PM

## 2012-06-17 NOTE — Evaluation (Signed)
Physical Therapy Evaluation Patient Details Name: Sophia Oliver MRN: [id number] DOB: [birth year] Today's Date: 06/17/2012 Time: 6295-2841 PT Time Calculation (min): 24 min  PT Assessment / Plan / Recommendation Clinical Impression  Pt s/p L THR.  Pt would benefit from acute PT services to increase independence with transfers and ambulation in preparation for d/c to SNF.  Pt educated on posterior hip precautions and PWB status.  Pt required a couple short rest breaks throughout evaluation to breath and relax since pt showing signs of anxiety (increased RR, shaky UEs)    PT Assessment  Patient needs continued PT services    Follow Up Recommendations  Skilled nursing facility    Barriers to Discharge        Equipment Recommendations  None recommended by PT    Recommendations for Other Services     Frequency 7X/week    Precautions / Restrictions Precautions Precautions: Fall;Posterior Hip Precaution Comments: Pt given handout Restrictions Weight Bearing Restrictions: Yes LLE Weight Bearing: Partial weight bearing LLE Partial Weight Bearing Percentage or Pounds: <50%   Pertinent Vitals/Pain 6/10 L hip pain, repositioned, ice applied      Mobility  Bed Mobility Bed Mobility: Supine to Sit Supine to Sit: 1: +2 Total assist Supine to Sit: Patient Percentage: 20% Details for Bed Mobility Assistance: verbal cues for technique and precautions, assist for weakness and L hip pain Transfers Transfers: Stand to Sit;Sit to Stand Sit to Stand: 1: +2 Total assist;With upper extremity assist;From bed;From elevated surface Sit to Stand: Patient Percentage: 60% Stand to Sit: 1: +2 Total assist;With upper extremity assist;To chair/3-in-1 Stand to Sit: Patient Percentage: 60% Details for Transfer Assistance: verbal cues for safe technique, assist for weakness and pain Ambulation/Gait Ambulation/Gait Assistance: 1: +2 Total assist Ambulation/Gait: Patient Percentage: 60% Ambulation Distance  (Feet): 14 Feet Assistive device: Rolling walker Ambulation/Gait Assistance Details: verbal cues for sequence, PWB, posture, neutral hip (not to internally rotate) Gait Pattern: Antalgic;Decreased stance time - left;Step-to pattern;Trunk flexed Gait velocity: decreased    Exercises     PT Diagnosis: Acute pain;Difficulty walking  PT Problem List: Decreased strength;Decreased range of motion;Decreased activity tolerance;Decreased mobility;Decreased knowledge of precautions;Pain;Decreased safety awareness;Decreased knowledge of use of DME PT Treatment Interventions: DME instruction;Gait training;Functional mobility training;Therapeutic activities;Therapeutic exercise;Patient/family education   PT Goals Acute Rehab PT Goals PT Goal Formulation: With patient Time For Goal Achievement: 06/24/12 Potential to Achieve Goals: Good Pt will go Supine/Side to Sit: with supervision PT Goal: Supine/Side to Sit - Progress: Goal set today Pt will go Sit to Supine/Side: with supervision PT Goal: Sit to Supine/Side - Progress: Goal set today Pt will go Sit to Stand: with supervision PT Goal: Sit to Stand - Progress: Goal set today Pt will go Stand to Sit: with supervision PT Goal: Stand to Sit - Progress: Goal set today Pt will Ambulate: with supervision;51 - 150 feet;with least restrictive assistive device PT Goal: Ambulate - Progress: Goal set today  Visit Information  Last PT Received On: 06/17/12 Assistance Needed: +2    Subjective Data  Subjective: pt with shaky UEs and states from nerves and pain.   Prior Functioning  Home Living Lives With: Spouse Available Help at Discharge: Skilled Nursing Facility Type of Home: House Home Layout: Two level;Bed/bath upstairs Home Adaptive Equipment: Straight cane;Walker - rolling Prior Function Level of Independence: Independent with assistive device(s) Communication Communication: No difficulties    Cognition  Overall Cognitive Status: Appears  within functional limits for tasks assessed/performed Arousal/Alertness: Awake/alert Orientation Level: Appears intact  for tasks assessed Behavior During Session: [facility] for tasks performed    Extremity/Trunk Assessment Right Lower Extremity Assessment RLE ROM/Strength/Tone: Deficits RLE ROM/Strength/Tone Deficits: grossly at least 3+/5 per functional observation Left Lower Extremity Assessment LLE ROM/Strength/Tone: Due to precautions;Due to pain;Unable to fully assess;Deficits LLE ROM/Strength/Tone Deficits: decreased active movement against gravity with  bed mobility   Balance    End of Session PT - End of Session Equipment Utilized During Treatment: Gait belt Activity Tolerance: Patient limited by pain;Patient limited by fatigue Patient left: in chair;with call bell/phone within reach Nurse Communication: Mobility status;Precautions;Weight bearing status  GP     Sophia Oliver,KATHrine E 06/17/2012, 12:16 PM Pager: [phone]

## 2012-06-17 NOTE — Progress Notes (Signed)
   Subjective: 1 Day Post-Op Procedure(s) (LRB): TOTAL HIP ARTHROPLASTY (Left)   Patient reports pain as mild, pain well controlled. She is very thankful as regards to fixing the hip. Only issues throughout the night referred to the heart rate issues.   Objective:   VITALS:   Filed Vitals:   06/17/12 0900  BP: 105/48  Pulse: 103  Temp: 100 F (37.8 C)   Resp: 11    Neurovascular intact Dorsiflexion/Plantar flexion intact Incision: dressing C/D/I No cellulitis present Compartment soft  LABS  Basename 06/17/12 0305  HGB 9.4*  HCT 28.0*  WBC 10.1  PLT 206     Basename 06/17/12 0305  NA 140  K 5.0  BUN 29*  CREATININE 1.23*  GLUCOSE 169*     Assessment/Plan: 1 Day Post-Op Procedure(s) (LRB): TOTAL HIP ARTHROPLASTY (Left)  Appreciate Dr Sharyn Lull sesing the pt and for the continued input Refer to Dr Sharyn Lull with regards to cardiac issues and medications. HV drain d/c'ed Foley cath d/c'ed Advance diet Up with therapy D/C IV fluids Discharge to SNF eventually, when ready with cardiology and ortho.   Anastasio Auerbach Lanard Arguijo   PAC  06/17/2012, 9:53 AM

## 2012-06-18 ENCOUNTER — Encounter: Payer: Medicare Other | Admitting: Internal Medicine

## 2012-06-18 LAB — CBC
HCT: 24.7 % — ABNORMAL LOW (ref 36.0–46.0)
Hemoglobin: 8.3 g/dL — ABNORMAL LOW (ref 12.0–15.0)
MCH: 32.8 pg (ref 26.0–34.0)
MCHC: 33.6 g/dL (ref 30.0–36.0)
RBC: 2.53 MIL/uL — ABNORMAL LOW (ref 3.87–5.11)

## 2012-06-18 LAB — BASIC METABOLIC PANEL
BUN: 28 mg/dL — ABNORMAL HIGH (ref 6–23)
CO2: 22 mEq/L (ref 19–32)
Calcium: 8.1 mg/dL — ABNORMAL LOW (ref 8.4–10.5)
GFR calc non Af Amer: 34 mL/min — ABNORMAL LOW (ref >90)
Glucose, Bld: 100 mg/dL — ABNORMAL HIGH (ref 70–99)
Potassium: 5.2 mEq/L — ABNORMAL HIGH (ref 3.5–5.1)

## 2012-06-18 MED ORDER — HYDROCODONE-ACETAMINOPHEN 5-325 MG PO TABS
0.5000 | ORAL_TABLET | ORAL | Status: DC | PRN
  Administered 2012-06-18: 1 via ORAL
  Administered 2012-06-18 (×3): 0.5 via ORAL
  Administered 2012-06-19: 2 via ORAL
  Filled 2012-06-18: qty 2
  Filled 2012-06-18 (×4): qty 1

## 2012-06-18 MED ORDER — DIGOXIN 0.25 MG/ML IJ SOLN
0.2500 mg | Freq: Once | INTRAMUSCULAR | Status: AC
  Administered 2012-06-18: 0.25 mg via INTRAVENOUS
  Filled 2012-06-18: qty 1

## 2012-06-18 MED ORDER — DIGOXIN 125 MCG PO TABS
0.1250 mg | ORAL_TABLET | Freq: Every day | ORAL | Status: DC
  Administered 2012-06-19: 0.125 mg via ORAL
  Filled 2012-06-18: qty 1

## 2012-06-18 NOTE — Progress Notes (Signed)
Subjective:  Patient denies any chest pain or shortness of breath. Complains of left hip pain. Heart rate better controlled with occasional episodes of paroxysmal A. fib/atrial tachycardia   Objective:  Vital Signs in the last 24 hours: Temp:  [97 F (36.1 C)-98.9 F (37.2 C)] 97.7 F (36.5 C) (08/15 0800) Pulse Rate:  [61-106] 71  (08/15 0400) Resp:  [7-100] 19  (08/15 0400) BP: (104-117)/(41-76) 115/54 mmHg (08/15 0341) SpO2:  [20 %-100 %] 95 % (08/15 0400) FiO2 (%):  [40 %] 40 % (08/15 0400) Weight:  [58 kg (127 lb 13.9 oz)] 58 kg (127 lb 13.9 oz) (08/14 2307)  Intake/Output from previous day: 08/14 0701 - 08/15 0700 In: 2300 [I.V.:2300] Out: 1175 [Urine:1175] Intake/Output from this shift:    Physical Exam: Neck: no adenopathy, no carotid bruit, no JVD and supple, symmetrical, trachea midline Lungs: clear to auscultation bilaterally Heart: regular rate and rhythm, S1, S2 normal and Soft systolic murmur no S3 gallop noted Abdomen: soft, non-tender; bowel sounds normal; no masses,  no organomegaly Extremities: extremities normal, atraumatic, no cyanosis or edema  Lab Results:  Basename 06/18/12 0306 06/17/12 0305  WBC 10.3 10.1  HGB 8.3* 9.4*  PLT 177 206    Basename 06/18/12 0306 06/17/12 0305  NA 136 140  K 5.2* 5.0  CL 106 106  CO2 22 18*  GLUCOSE 100* 169*  BUN 28* 29*  CREATININE 1.39* 1.23*   No results found for this basename: TROPONINI:2,CK,MB:2 in the last 72 hours Hepatic Function Panel No results found for this basename: PROT,ALBUMIN,AST,ALT,ALKPHOS,BILITOT,BILIDIR,IBILI in the last 72 hours No results found for this basename: CHOL in the last 72 hours No results found for this basename: PROTIME in the last 72 hours  Imaging: Imaging results have been reviewed  Cardiac Studies:  Assessment/Plan:  Paroxysmal atrial tachycardia /-A. fib Status post congestive heart failure secondary to volume overload and tachycardia  Status post left hip  arthroplasty stable  Postop anemia Mild coronary artery disease  Hypertension  Sick sinus syndrome  Tachybradycardia syndrome status post permanent pacemaker  Chronic kidney disease stage II  GERD  History of CA of breast  Degenerative joint disease Plan Digoxin 0.25 mg IV one dose today Change IV digoxin to by mouth 0.125 daily from tomorrow I will sign off please call if needed  LOS: 2 days    Sophia Oliver 06/18/2012, 9:49 AM

## 2012-06-18 NOTE — Progress Notes (Signed)
Physical Therapy Treatment Patient Details Name: Sophia Oliver MRN: [id number] DOB: 10-16-1924 Today's Date: 06/18/2012 Time: 4098-1191 PT Time Calculation (min): 18 min  PT Assessment / Plan / Recommendation Comments on Treatment Session  Pt reports she does not wish to be OOB today but agreeable to perform exercises.  Pt reports she is nervous about pain because she has been hurting for a long time.  Attempted to calm pt's fears and will attempt to progress mobility with next visit.    Follow Up Recommendations  Skilled nursing facility    Barriers to Discharge        Equipment Recommendations  Defer to next venue    Recommendations for Other Services    Frequency     Plan Discharge plan remains appropriate;Frequency remains appropriate    Precautions / Restrictions Precautions Precautions: Fall;Posterior Hip Restrictions Weight Bearing Restrictions: Yes LLE Weight Bearing: Partial weight bearing LLE Partial Weight Bearing Percentage or Pounds: <50 %   Pertinent Vitals/Pain 8/10 pain after exercises, repositioned and RN made aware and to medicate pt    Mobility  Bed Mobility Bed Mobility: Rolling Right Rolling Right: 3: Mod assist Details for Bed Mobility Assistance: assist for lower body, rolling to use bed pan, pillow kept between LEs    Exercises Total Joint Exercises Ankle Circles/Pumps: AROM;Both;20 reps;Supine Quad Sets: AROM;Both;10 reps Gluteal Sets: AROM;Both;10 reps Short Arc Quad: AROM;Strengthening;Left;10 reps;Supine Heel Slides: AAROM;Left;10 reps;Other (comment) (within precautions) Hip ABduction/ADduction: AAROM;Strengthening;Left;10 reps;Supine   PT Diagnosis:    PT Problem List:   PT Treatment Interventions:     PT Goals Acute Rehab PT Goals PT Goal: Supine/Side to Sit - Progress: Progressing toward goal  Visit Information  Last PT Received On: 06/18/12 Assistance Needed: +2    Subjective Data  Subjective: I need to use the bed pan.     Cognition  Overall Cognitive Status: Appears within functional limits for tasks assessed/performed    Balance     End of Session PT - End of Session Activity Tolerance: Patient limited by pain;Patient limited by fatigue Patient left: in bed;with call bell/phone within reach;with nursing in room Nurse Communication: Patient requests pain meds   GP     Maely Clements,KATHrine E 06/18/2012, 4:11 PM Pager: [phone]

## 2012-06-18 NOTE — Clinical Social Work Placement (Signed)
Clinical Social Work Department CLINICAL SOCIAL WORK PLACEMENT NOTE 06/18/2012  Patient:  JANET, HUMPHREYS  Account Number:  [id number] Admit date:  06/16/2012  Clinical Social Worker:  Jodelle Red  Date/time:  06/18/2012 10:05 AM  Clinical Social Work is seeking post-discharge placement for this patient at the following level of care:   SKILLED NURSING   (*CSW will update this form in Epic as items are completed)   06/17/2012  Patient/family provided with [facility] Department of Clinical Social Work's list of facilities offering this level of care within the geographic area requested by the patient (or if unable, by the patient's family).  06/17/2012  Patient/family informed of their freedom to choose among providers that offer the needed level of care, that participate in Medicare, Medicaid or managed care program needed by the patient, have an available bed and are willing to accept the patient.  06/17/2012  Patient/family informed of [facility]' ownership interest in [facility], as well as of the fact that they are under no obligation to receive care at this facility.  PASARR submitted to EDS on 06/17/2012 PASARR number received from E[employer] on 06/17/2012  FL2 transmitted to all facilities in geographic area requested by pt/family on  06/17/2012 FL2 transmitted to all facilities within larger geographic area on   Patient informed that his/her managed care company has contracts with or will negotiate with  certain facilities, including the following:     Patient/family informed of bed offers received:   Patient chooses bed at  Physician recommends and patient chooses bed at    Patient to be transferred to  on   Patient to be transferred to facility by   The following physician request were entered in Epic:   Additional Comments: Pt had completed paperwork with [facility] SNF prior to admission and this is her ONLY preference.  Doreen Salvage,  LCSWA Clinical Social Worker [facility] Cell [phone]

## 2012-06-18 NOTE — Progress Notes (Signed)
   Subjective: 2 Days Post-Op Procedure(s) (LRB): TOTAL HIP ARTHROPLASTY (Left)   Patient reports pain as moderate, still needing dilaudid IV to control pain. No events throughout the night. Talked with Dr. Sharyn Lull and he said the patient is stable, he has signed off.    Objective:   VITALS:   Filed Vitals:   06/18/12 0800  BP: 115/54  Pulse: 71  Temp: 97.7 F (36.5 C)  Resp: 19    Neurovascular intact Dorsiflexion/Plantar flexion intact Incision: dressing C/D/I No cellulitis present Compartment soft  LABS  Basename 06/18/12 0306 06/17/12 0305  HGB 8.3* 9.4*  HCT 24.7* 28.0*  WBC 10.3 10.1  PLT 177 206     Basename 06/18/12 0306 06/17/12 0305  NA 136 140  K 5.2* 5.0  BUN 28* 29*  CREATININE 1.39* 1.23*  GLUCOSE 100* 169*     Assessment/Plan: 2 Days Post-Op Procedure(s) (LRB): TOTAL HIP ARTHROPLASTY (Left)   Up with therapy Patient encouraged to try to stop using the IV dilaudid and try oral pain medications Lower dose Norco added along with Phenergan to be attempted prior to the IV dilaudid Transfer patient to telemetry Dr. Sharyn Lull has signed off on the patient Discharge to SNF possibly tomorrow if she stay stable   Anastasio Auerbach. Juana Montini   PAC  06/18/2012, 9:57 AM

## 2012-06-19 MED ORDER — DIGOXIN 125 MCG PO TABS: 0.1250 mg | ORAL_TABLET | Freq: Every day | ORAL | Status: DC

## 2012-06-19 MED ORDER — HYDROCODONE-ACETAMINOPHEN 5-325 MG PO TABS: 0.5000 | ORAL_TABLET | ORAL | Status: DC | PRN

## 2012-06-19 MED ORDER — FERROUS SULFATE 325 (65 FE) MG PO TABS: 325.0000 mg | ORAL_TABLET | Freq: Three times a day (TID) | ORAL | Status: DC

## 2012-06-19 MED ORDER — PROMETHAZINE HCL 12.5 MG PO TABS: 12.5000 mg | ORAL_TABLET | Freq: Four times a day (QID) | ORAL | Status: DC | PRN

## 2012-06-19 MED ORDER — DSS 100 MG PO CAPS: 100.0000 mg | ORAL_CAPSULE | Freq: Two times a day (BID) | ORAL | Status: DC

## 2012-06-19 MED ORDER — METHOCARBAMOL 500 MG PO TABS: 500.0000 mg | ORAL_TABLET | Freq: Four times a day (QID) | ORAL | Status: DC | PRN

## 2012-06-19 MED ORDER — DIPHENHYDRAMINE HCL 25 MG PO CAPS: 25.0000 mg | ORAL_CAPSULE | Freq: Four times a day (QID) | ORAL | Status: DC | PRN

## 2012-06-19 MED ORDER — ASPIRIN EC 81 MG PO TBEC: 81.0000 mg | DELAYED_RELEASE_TABLET | Freq: Every day | ORAL | Status: DC

## 2012-06-19 MED ORDER — POLYETHYLENE GLYCOL 3350 17 G PO PACK: 17.0000 g | PACK | Freq: Two times a day (BID) | ORAL | Status: AC

## 2012-06-19 NOTE — Progress Notes (Signed)
   Subjective: 3 Days Post-Op Procedure(s) (LRB): TOTAL HIP ARTHROPLASTY (Left)   Patient reports pain as mild, well controlled with analgesic medications. No events throughout the night. Was transferred to telemetry without issues. Ready to be discharged to SNF.  Objective:   VITALS:   Filed Vitals:   06/19/12 0600  BP: 126/71  Pulse: 81  Temp: 98.4 F (36.9 C)  Resp: 16    Neurovascular intact Dorsiflexion/Plantar flexion intact Incision: dressing C/D/I No cellulitis present Compartment soft  LABS  Basename 06/18/12 0306 06/17/12 0305  HGB 8.3* 9.4*  HCT 24.7* 28.0*  WBC 10.3 10.1  PLT 177 206     Basename 06/18/12 0306 06/17/12 0305  NA 136 140  K 5.2* 5.0  BUN 28* 29*  CREATININE 1.39* 1.23*  GLUCOSE 100* 169*     Assessment/Plan: 3 Days Post-Op Procedure(s) (LRB): TOTAL HIP ARTHROPLASTY (Left)   Up with therapy Discharge to SNF Follow up in 2 weeks at [facility].  Follow-up Information    Follow up with OLIN,Lucan Riner D in 2 weeks.   Contact information:   [facility] [address], [address] [place] [address] [phone]           Karyss Frese S. Emery Dupuy   PAC  06/19/2012, 9:07 AM   

## 2012-06-19 NOTE — Progress Notes (Signed)
Patient cleared for discharge. Packet copied and placed in [facility]. ptar called for transportation. Patient aware and agreeable. CSW called patients son, bill, left voicemail informing of d/c.  Thurza Kwiecinski C. Andoni Busch MSW, LCSW [phone]

## 2012-06-19 NOTE — Discharge Summary (Signed)
Physician Discharge Summary  Patient ID: Sophia Oliver MRN: [id number] DOB/AGE: 08-May-1924 78 y.o.  Admit date: 06/16/2012 Discharge date:  06/19/2012  Procedures:  Procedure(s) (LRB): TOTAL HIP ARTHROPLASTY (Left)  Attending Physician:  Dr. Durene Romans   Admission Diagnoses:   Left hip OA and pain   Discharge Diagnoses:  Principal Problem:  *S/P left THA, posterior Syncope   Sinus node dysfunction   S/P cardiac pacemaker procedure   Wide-complex tachycardia   Cholelithiasis   Glucose intolerance (impaired glucose tolerance)   DJD (degenerative joint disease)   Anxiety   PONV (postoperative nausea and vomiting)  Pacemaker   HPI: Pt is a 78 y.o. female complaining of left hip pain for 3+ years. Pain had continually increased since the beginning, but worse over the last 8-9 months. X-rays in the clinic show end-stage arthritic changes of the left hip. Pt has tried various conservative treatments which have failed to alleviate their symptoms, including NSAIDs, multiple steroid injection and treatments from other doctors . Various options are discussed with the patient. Risks, benefits and expectations were discussed with the patient. Patient understand the risks, benefits and expectations and wishes to proceed with surgery.   PCP: Pearson Grippe, MD   Discharged Condition: good  Hospital Course:  Patient underwent the above stated procedure on 06/16/2012. Patient tolerated the procedure well and brought to the recovery room in good condition and subsequently to the floor.  POD #1 BP: 105/48 ; Pulse: 103 ; Temp: 100 F (37.8 C) ; Resp: 11  Pt's foley was removed, as well as the hemovac drain removed. IV was changed to a saline lock. Patient reports pain as mild, pain well controlled. She is very thankful as regards to fixing the hip. Only issues throughout the night referred to the heart rate issues. She was post-op brought to step-down unit and was seen by Dr. Sharyn Lull, who  monitored her heart rate and pacemaker. Neurovascular intact, dorsiflexion/plantar flexion intact, incision: dressing C/D/I, no cellulitis present and compartment soft.   LABS  Basename  06/17/12 0305   HGB  9.4  HCT  28.0   POD #2  BP: 115/54 ; Pulse: 71 ; Temp: 97.7 F (36.5 C) ; Resp: 19 Patient reports pain as moderate, still needing dilaudid IV to control pain. No events throughout the night. Talked with Dr. Sharyn Lull and he said the patient is stable, he has signed off. She was transferred to telemetry, but out of the ICU. Neurovascular intact, dorsiflexion/plantar flexion intact, incision: dressing C/D/I, no cellulitis present and compartment soft.   LABS  Basename  06/18/12 0306  HGB  8.3  HCT  24.7   POD #3  BP: 126/71 ; Pulse: 81 ; Temp: 98.4 F (36.9 C) ; Resp: 16  Patient reports pain as mild, well controlled with analgesic medications. No events throughout the night. Was transferred to telemetry without issues. Ready to be discharged to SNF.  Neurovascular intact, dorsiflexion/plantar flexion intact, incision: dressing C/D/I, no cellulitis present and compartment soft.   LABS   No new labs   Discharge Exam: General appearance: alert, cooperative and no distress Extremities: Homans sign is negative, no sign of DVT, no edema, redness or tenderness in the calves or thighs and no ulcers, gangrene or trophic changes  Disposition: SNF  with follow up   Follow-up Information    Follow up with [name], MD. Schedule an appointment as soon as possible for a visit in 2 weeks.   Contact information:   [facility] [address], [address] [place] [address] [phone]       Follow up with Robynn Pane, MD. Schedule an appointment as soon as possible for a visit in 2 weeks.   Contact information:   [address] [place] [address] [phone]          Discharge Orders    Future Orders  Please Complete By Expires   Diet - low sodium heart healthy      Call MD / Call 911      Comments:   If you experience chest pain or shortness of breath, CALL 911 and be transported to the hospital emergency room.  If you develope a fever above 101 F, pus (white drainage) or increased drainage or redness at the wound, or calf pain, call your surgeon's office.   Discharge instructions      Comments:   Maintain surgical dressing for 8 days, then replace with gauze and tape. Keep the area dry and clean until follow up. Follow up in 2 weeks at [facility]. Call with any questions or concerns.   Constipation Prevention      Comments:   Drink plenty of fluids.  Prune juice may be helpful.  You may use a stool softener, such as Colace (over the counter) 100 mg twice a day.  Use MiraLax (over the counter) for constipation as needed.   Increase activity slowly as tolerated      Driving restrictions      Comments:   No driving for 4 weeks   Change dressing      Comments:   Maintain surgical dressing for 8 days, then replace with 4x4 guaze and tape. Keep the area dry and clean.   TED hose      Comments:   Use stockings (TED hose) for 2 weeks on both leg(s).  You may remove them at night for sleeping.      Current Discharge Medication List    START taking these medications   Details  aspirin EC 81 MG tablet Take 1 tablet (81 mg total) by mouth daily.    digoxin (LANOXIN) 0.125 MG tablet Take 1 tablet (0.125 mg total) by mouth daily. Qty: 30 tablet, Refills: 0   Comments: To be followed up with Dr. Sharyn Lull    diphenhydrAMINE (BENADRYL) 25 mg capsule Take 1 capsule (25 mg total) by mouth every 6 (six) hours as needed for itching, allergies or sleep. Qty: 30 capsule    docusate sodium 100 MG CAPS Take 100 mg by mouth 2 (two) times daily. Qty: 10 capsule    ferrous sulfate 325 (65 FE) MG tablet Take 1 tablet (325 mg total) by mouth 3 (three) times daily after meals.      HYDROcodone-acetaminophen (NORCO/VICODIN) 5-325 MG per tablet Take 0.5-2 tablets by mouth every 4 (four) hours as needed for pain. Qty: 100 tablet, Refills: 0    methocarbamol (ROBAXIN) 500 MG tablet Take 1 tablet (500 mg total) by mouth every 6 (six) hours as needed (muscle spasms). Qty: 50 tablet, Refills: 0    polyethylene glycol (MIRALAX / GLYCOLAX) packet Take 17 g by mouth 2 (two) times daily. Qty: 14 each    promethazine (PHENERGAN) 12.5 MG tablet Take 1-2 tablets (12.5-25 mg total) by mouth every 6 (six) hours as needed for nausea. Qty: 30 tablet, Refills: 0      CONTINUE these medications which have NOT CHANGED   Details  ALPRAZolam (XANAX) 0.25  MG tablet Take 0.25 mg by mouth every morning.     cholecalciferol (VITAMIN D) 1000 UNITS tablet Take 1,000 Units by mouth daily.    dabigatran (PRADAXA) 75 MG CAPS Take 75 mg by mouth 2 (two) times daily.  Qty: 60 capsule   Associated Diagnoses: Atrial fibrillation    metoprolol succinate (TOPROL-XL) 50 MG 24 hr tablet Take 50 mg by mouth 2 (two) times daily. Take with or immediately following a meal.    pantoprazole (PROTONIX) 40 MG tablet Take 40 mg by mouth every morning.     sitaGLIPtin (JANUVIA) 100 MG tablet Take 50 mg by mouth every morning.    spironolactone (ALDACTONE) 25 MG tablet Take 25 mg by mouth every morning.    fish oil-omega-3 fatty acids 1000 MG capsule Take 1 g by mouth daily.         Signed: Anastasio Auerbach. Jehieli Brassell   PAC  06/19/2012, 9:15 AM

## 2012-06-19 NOTE — Care Management Note (Signed)
    Page 1 of 1   06/19/2012     12:21:04 PM   CARE MANAGEMENT NOTE 06/19/2012  Patient:  Sophia Oliver, Sophia Oliver   Account Number:  [id number]  Date Initiated:  06/19/2012  Documentation initiated by:  Lanier Clam  Subjective/Objective Assessment:   ADMITTED W/L HIP FX.L THA.     Action/Plan:   FROM SNF   Anticipated DC Date:  06/19/2012   Anticipated DC Plan:  SKILLED NURSING FACILITY  In-house referral  Clinical Social Worker      DC Planning Services  CM consult      Choice offered to / List presented to:             Status of service:  Completed, signed off Medicare Important Message given?   (If response is "NO", the following Medicare IM given date fields will be blank) Date Medicare IM given:   Date Additional Medicare IM given:    Discharge Disposition:  SKILLED NURSING FACILITY  Per UR Regulation:  Reviewed for med. necessity/level of care/duration of stay  If discussed at Long Length of Stay Meetings, dates discussed:    Comments:  06/19/12 Anndee Connett RN,BSN NCM [phone] D/C RETURN SNF.

## 2012-06-20 LAB — TYPE AND SCREEN
ABO/RH(D): A POS
Antibody Screen: POSITIVE
DAT, IgG: NEGATIVE
Unit division: 0

## 2012-06-30 ENCOUNTER — Emergency Department (HOSPITAL_COMMUNITY): Payer: Medicare Other

## 2012-06-30 ENCOUNTER — Encounter (HOSPITAL_COMMUNITY): Payer: Self-pay | Admitting: Emergency Medicine

## 2012-06-30 ENCOUNTER — Encounter (HOSPITAL_COMMUNITY): Payer: Self-pay | Admitting: Anesthesiology

## 2012-06-30 ENCOUNTER — Encounter (HOSPITAL_COMMUNITY)
Admission: EM | Disposition: A | Discharge status: Skilled Nursing Facility | Payer: Self-pay | Source: Home / Self Care | Attending: Orthopedic Surgery

## 2012-06-30 ENCOUNTER — Inpatient Hospital Stay (HOSPITAL_COMMUNITY)
Admission: EM | Admit: 2012-06-30 | Discharge: 2012-07-01 | DRG: 561 | Disposition: A | Discharge status: Skilled Nursing Facility | Payer: Medicare Other | Attending: Orthopedic Surgery | Admitting: Orthopedic Surgery

## 2012-06-30 ENCOUNTER — Emergency Department (HOSPITAL_COMMUNITY): Payer: Medicare Other | Admitting: Anesthesiology

## 2012-06-30 DIAGNOSIS — R269 Unspecified abnormalities of gait and mobility: Secondary | ICD-10-CM | POA: Diagnosis present

## 2012-06-30 DIAGNOSIS — Y831 Surgical operation with implant of artificial internal device as the cause of abnormal reaction of the patient, or of later complication, without mention of misadventure at the time of the procedure: Secondary | ICD-10-CM | POA: Diagnosis present

## 2012-06-30 DIAGNOSIS — Z96649 Presence of unspecified artificial hip joint: Secondary | ICD-10-CM

## 2012-06-30 DIAGNOSIS — I4891 Unspecified atrial fibrillation: Secondary | ICD-10-CM | POA: Diagnosis present

## 2012-06-30 DIAGNOSIS — Z950 Presence of cardiac pacemaker: Secondary | ICD-10-CM

## 2012-06-30 DIAGNOSIS — Z96659 Presence of unspecified artificial knee joint: Secondary | ICD-10-CM

## 2012-06-30 DIAGNOSIS — T84029A Dislocation of unspecified internal joint prosthesis, initial encounter: Principal | ICD-10-CM | POA: Diagnosis present

## 2012-06-30 DIAGNOSIS — Z853 Personal history of malignant neoplasm of breast: Secondary | ICD-10-CM

## 2012-06-30 HISTORY — PX: HIP CLOSED REDUCTION: SHX983

## 2012-06-30 LAB — POCT I-STAT, CHEM 8
Calcium, Ion: 1.22 mmol/L (ref 1.13–1.30)
Creatinine, Ser: 1.5 mg/dL — ABNORMAL HIGH (ref 0.50–1.10)
Glucose, Bld: 93 mg/dL (ref 70–99)
HCT: 31 % — ABNORMAL LOW (ref 36.0–46.0)
Hemoglobin: 10.5 g/dL — ABNORMAL LOW (ref 12.0–15.0)

## 2012-06-30 LAB — CBC WITH DIFFERENTIAL/PLATELET
Basophils Absolute: 0.1 10*3/uL (ref 0.0–0.1)
Eosinophils Relative: 2 % (ref 0–5)
Lymphocytes Relative: 22 % (ref 12–46)
Neutro Abs: 6.1 10*3/uL (ref 1.7–7.7)
Platelets: 459 10*3/uL — ABNORMAL HIGH (ref 150–400)
RDW: 14.6 % (ref 11.5–15.5)
WBC: 9.4 10*3/uL (ref 4.0–10.5)

## 2012-06-30 LAB — URINALYSIS, ROUTINE W REFLEX MICROSCOPIC
Ketones, ur: NEGATIVE mg/dL
Leukocytes, UA: NEGATIVE
Nitrite: NEGATIVE
Specific Gravity, Urine: 1.018 (ref 1.005–1.030)
pH: 5 (ref 5.0–8.0)

## 2012-06-30 LAB — APTT: aPTT: 35 seconds (ref 24–37)

## 2012-06-30 SURGERY — CLOSED MANIPULATION, JOINT, HIP
Anesthesia: General | Site: Hip | Laterality: Left | Wound class: Clean

## 2012-06-30 MED ORDER — METOPROLOL TARTRATE 50 MG PO TABS
50.0000 mg | ORAL_TABLET | Freq: Two times a day (BID) | ORAL | Status: DC
Start: 2012-06-30 — End: 2012-07-01
  Administered 2012-06-30 – 2012-07-01 (×2): 50 mg via ORAL
  Filled 2012-06-30 (×3): qty 1

## 2012-06-30 MED ORDER — PROPOFOL 10 MG/ML IV BOLUS
INTRAVENOUS | Status: DC | PRN
  Administered 2012-06-30: 150 mg via INTRAVENOUS

## 2012-06-30 MED ORDER — DEXTROSE 5 % IV SOLN
500.0000 mg | Freq: Once | INTRAVENOUS | Status: AC
  Administered 2012-06-30: 500 mg via INTRAVENOUS
  Filled 2012-06-30 (×2): qty 5

## 2012-06-30 MED ORDER — DOCUSATE SODIUM 100 MG PO CAPS
100.0000 mg | ORAL_CAPSULE | Freq: Two times a day (BID) | ORAL | Status: DC
Start: 2012-06-30 — End: 2012-07-01
  Administered 2012-06-30 – 2012-07-01 (×2): 100 mg via ORAL

## 2012-06-30 MED ORDER — LACTATED RINGERS IV SOLN: INTRAVENOUS | Status: DC

## 2012-06-30 MED ORDER — SPIRONOLACTONE 25 MG PO TABS
25.0000 mg | ORAL_TABLET | Freq: Every morning | ORAL | Status: DC
  Administered 2012-07-01: 25 mg via ORAL
  Filled 2012-06-30: qty 1

## 2012-06-30 MED ORDER — HYDROMORPHONE HCL PF 1 MG/ML IJ SOLN
0.5000 mg | INTRAMUSCULAR | Status: DC | PRN
  Administered 2012-06-30: 0.5 mg via INTRAVENOUS

## 2012-06-30 MED ORDER — FENTANYL CITRATE 0.05 MG/ML IJ SOLN
25.0000 ug | INTRAMUSCULAR | Status: DC | PRN
  Administered 2012-06-30 (×2): 50 ug via INTRAVENOUS

## 2012-06-30 MED ORDER — LACTATED RINGERS IV SOLN
INTRAVENOUS | Status: DC | PRN
  Administered 2012-06-30: 19:00:00 via INTRAVENOUS

## 2012-06-30 MED ORDER — SODIUM CHLORIDE 0.9 % IV SOLN
INTRAVENOUS | Status: DC
  Administered 2012-07-01: 01:00:00 via INTRAVENOUS

## 2012-06-30 MED ORDER — ALPRAZOLAM 0.25 MG PO TABS
0.2500 mg | ORAL_TABLET | Freq: Two times a day (BID) | ORAL | Status: DC
  Administered 2012-06-30 – 2012-07-01 (×2): 0.25 mg via ORAL
  Filled 2012-06-30 (×2): qty 1

## 2012-06-30 MED ORDER — PANTOPRAZOLE SODIUM 40 MG PO TBEC
40.0000 mg | DELAYED_RELEASE_TABLET | Freq: Every morning | ORAL | Status: DC
  Administered 2012-07-01: 40 mg via ORAL
  Filled 2012-06-30: qty 1

## 2012-06-30 MED ORDER — METHOCARBAMOL 500 MG PO TABS
500.0000 mg | ORAL_TABLET | Freq: Four times a day (QID) | ORAL | Status: DC | PRN
  Administered 2012-07-01: 500 mg via ORAL
  Filled 2012-06-30: qty 1

## 2012-06-30 MED ORDER — PROMETHAZINE HCL 25 MG/ML IJ SOLN: 6.2500 mg | INTRAMUSCULAR | Status: DC | PRN

## 2012-06-30 MED ORDER — FENTANYL CITRATE 0.05 MG/ML IJ SOLN
INTRAMUSCULAR | Status: AC
  Filled 2012-06-30: qty 2

## 2012-06-30 MED ORDER — FENTANYL CITRATE 0.05 MG/ML IJ SOLN
50.0000 ug | Freq: Once | INTRAMUSCULAR | Status: AC
  Administered 2012-06-30: 50 ug via INTRAVENOUS
  Filled 2012-06-30: qty 2

## 2012-06-30 MED ORDER — SUCCINYLCHOLINE CHLORIDE 20 MG/ML IJ SOLN
INTRAMUSCULAR | Status: DC | PRN
  Administered 2012-06-30: 100 mg via INTRAVENOUS

## 2012-06-30 MED ORDER — FERROUS SULFATE 325 (65 FE) MG PO TABS
325.0000 mg | ORAL_TABLET | Freq: Three times a day (TID) | ORAL | Status: DC
  Administered 2012-07-01: 325 mg via ORAL
  Filled 2012-06-30 (×4): qty 1

## 2012-06-30 MED ORDER — HYDROCODONE-ACETAMINOPHEN 5-325 MG PO TABS
1.0000 | ORAL_TABLET | ORAL | Status: DC | PRN
  Administered 2012-07-01 (×3): 1 via ORAL
  Filled 2012-06-30 (×2): qty 1
  Filled 2012-06-30: qty 2

## 2012-06-30 MED ORDER — DIGOXIN 125 MCG PO TABS
0.1250 mg | ORAL_TABLET | Freq: Every day | ORAL | Status: DC
  Administered 2012-07-01: 0.125 mg via ORAL
  Filled 2012-06-30: qty 1

## 2012-06-30 MED ORDER — FENTANYL CITRATE 0.05 MG/ML IJ SOLN
INTRAMUSCULAR | Status: DC | PRN
  Administered 2012-06-30: 100 ug via INTRAVENOUS

## 2012-06-30 MED ORDER — DABIGATRAN ETEXILATE MESYLATE 75 MG PO CAPS
75.0000 mg | ORAL_CAPSULE | Freq: Every day | ORAL | Status: DC
  Administered 2012-07-01: 75 mg via ORAL
  Filled 2012-06-30: qty 1

## 2012-06-30 MED ORDER — FENTANYL CITRATE 0.05 MG/ML IJ SOLN
25.0000 ug | Freq: Once | INTRAMUSCULAR | Status: AC
  Administered 2012-06-30: 25 ug via INTRAVENOUS
  Filled 2012-06-30: qty 2

## 2012-06-30 MED ORDER — LINAGLIPTIN 5 MG PO TABS
5.0000 mg | ORAL_TABLET | Freq: Every day | ORAL | Status: DC
  Administered 2012-07-01: 5 mg via ORAL
  Filled 2012-06-30: qty 1

## 2012-06-30 MED ORDER — PROMETHAZINE HCL 25 MG PO TABS: 12.5000 mg | ORAL_TABLET | Freq: Four times a day (QID) | ORAL | Status: DC | PRN

## 2012-06-30 MED ORDER — POLYETHYLENE GLYCOL 3350 17 G PO PACK
17.0000 g | PACK | Freq: Two times a day (BID) | ORAL | Status: DC
  Administered 2012-06-30: 17 g via ORAL

## 2012-06-30 MED ORDER — ONDANSETRON HCL 4 MG/2ML IJ SOLN
4.0000 mg | Freq: Once | INTRAMUSCULAR | Status: AC
  Administered 2012-06-30: 4 mg via INTRAVENOUS
  Filled 2012-06-30: qty 2

## 2012-06-30 MED ORDER — ASPIRIN 81 MG PO CHEW
81.0000 mg | CHEWABLE_TABLET | Freq: Every day | ORAL | Status: DC
  Administered 2012-07-01: 81 mg via ORAL
  Filled 2012-06-30: qty 1

## 2012-06-30 MED ORDER — SODIUM CHLORIDE 0.9 % IV SOLN
INTRAVENOUS | Status: DC
  Administered 2012-06-30: 17:00:00 via INTRAVENOUS

## 2012-06-30 MED ORDER — HYDROMORPHONE HCL PF 1 MG/ML IJ SOLN
INTRAMUSCULAR | Status: AC
  Filled 2012-06-30: qty 1

## 2012-06-30 SURGICAL SUPPLY — 3 items
CLOTH BEACON ORANGE TIMEOUT ST (SAFETY) ×2 IMPLANT
IMMOBILIZER KNEE 20 (SOFTGOODS) ×2
IMMOBILIZER KNEE 20 THIGH 36 (SOFTGOODS) ×1 IMPLANT

## 2012-06-30 NOTE — Anesthesia Postprocedure Evaluation (Signed)
Anesthesia Post Note  Patient: Sophia Oliver  Procedure(s) Performed: Procedure(s) (LRB): CLOSED MANIPULATION HIP (Left)  Anesthesia type: General  Patient location: PACU  Post pain: Pain level controlled  Post assessment: Post-op Vital signs reviewed  Last Vitals:  Filed Vitals:   06/30/12 1930  BP: 145/58  Pulse: 74  Temp:   Resp: 19    Post vital signs: Reviewed  Level of consciousness: sedated  Complications: No apparent anesthesia complications

## 2012-06-30 NOTE — Preoperative (Signed)
Beta Blockers   Reason not to administer Beta Blockers:Not Applicable 

## 2012-06-30 NOTE — ED Notes (Signed)
[id number]> Expected date:<BR> Expected time:<BR> Means of arrival:<BR> Comments:<BR> Hip dislocation

## 2012-06-30 NOTE — H&P (View-Only) (Signed)
   Subjective: 3 Days Post-Op Procedure(s) (LRB): TOTAL HIP ARTHROPLASTY (Left)   Patient reports pain as mild, well controlled with analgesic medications. No events throughout the night. Was transferred to telemetry without issues. Ready to be discharged to SNF.  Objective:   VITALS:   Filed Vitals:   06/19/12 0600  BP: 126/71  Pulse: 81  Temp: 98.4 F (36.9 C)  Resp: 16    Neurovascular intact Dorsiflexion/Plantar flexion intact Incision: dressing C/D/I No cellulitis present Compartment soft  LABS  Basename 06/18/12 0306 06/17/12 0305  HGB 8.3* 9.4*  HCT 24.7* 28.0*  WBC 10.3 10.1  PLT 177 206     Basename 06/18/12 0306 06/17/12 0305  NA 136 140  K 5.2* 5.0  BUN 28* 29*  CREATININE 1.39* 1.23*  GLUCOSE 100* 169*     Assessment/Plan: 3 Days Post-Op Procedure(s) (LRB): TOTAL HIP ARTHROPLASTY (Left)   Up with therapy Discharge to SNF Follow up in 2 weeks at [facility].  Follow-up Information    Follow up with OLIN,Kip Cropp D in 2 weeks.   Contact information:   [facility] [address], [address] [place] [address] [phone]           Anastasio Auerbach. Pihu Basil   PAC  06/19/2012, 9:07 AM

## 2012-06-30 NOTE — Transfer of Care (Signed)
Immediate Anesthesia Transfer of Care Note  Patient: Sophia Oliver  Procedure(s) Performed: Procedure(s) (LRB): CLOSED MANIPULATION HIP (Left)  Patient Location: PACU  Anesthesia Type: General  Level of Consciousness: awake, alert  and oriented  Airway & Oxygen Therapy: Patient Spontanous Breathing and Patient connected to face mask oxygen  Post-op Assessment: Report given to PACU RN and Post -op Vital signs reviewed and stable  Post vital signs: Reviewed and stable  Complications: No apparent anesthesia complications

## 2012-06-30 NOTE — ED Provider Notes (Signed)
History     CSN: [id number]  Arrival date & time 06/30/12  1521   First MD Initiated Contact with Patient 06/30/12 1533      Chief Complaint  Patient presents with  . Hip Injury    (Consider location/radiation/quality/duration/timing/severity/associated sxs/prior treatment) The history is provided by the patient.   patient was sent in from rehabilitation with a dislocated left hip prosthesis. She had a hip replacement done by Dr. Charlann Boxer 2 weeks ago. She's had pain since and reportedly had an x-ray. X-ray shows left hip prosthetic dislocation and pubic rami fracture. Patient has pain with. No fall. She states that she is a piece of chicken around noon today. He  Past Medical History  Diagnosis Date  . Syncope   . Sinus node dysfunction   . S/P cardiac pacemaker procedure     Guidant  . Wide-complex tachycardia   . Cholelithiasis   . Glucose intolerance (impaired glucose tolerance)   . DJD (degenerative joint disease)   . Anxiety   . PONV (postoperative nausea and vomiting)   . Pacemaker   . Chronic kidney disease     frequency  . Breast cancer     left breast - hx of 2003   . Injury of right upper arm     after venipuncture in 3/13 - pt describes pain afterwards- no followup done seen by PCP- no further treatment     Past Surgical History  Procedure Date  . S/p pacemaker   . Replacement total knee bilateral   . Total shoulder replacement   . Total hip arthroplasty   . Femur fracture surgery   . Hysterectomy, unspecified area   . Masterectomy   . Abdominal hysterectomy   . Breast surgery     left breast - 2003   . Total hip arthroplasty 06/16/2012    Procedure: TOTAL HIP ARTHROPLASTY;  Surgeon: [name], MD;  Location: [facility];  Service: Orthopedics;  Laterality: Left;    History reviewed. No pertinent family history.  History  Substance Use Topics  . Smoking status: Former Smoker    Quit date: 11/04/1948  . Smokeless tobacco: Never Used  . Alcohol Use:  No    OB History    Grav Para Term Preterm Abortions TAB SAB Ect Mult Living                  Review of Systems  Constitutional: Negative for appetite change.  Respiratory: Negative for chest tightness and shortness of breath.   Gastrointestinal: Negative for abdominal pain.  Musculoskeletal: Positive for gait problem. Negative for back pain and joint swelling.  Skin: Negative for pallor and rash.  Neurological: Negative for weakness and numbness.    Allergies  Codeine and Other  Home Medications   No current outpatient prescriptions on file.  BP 133/72  Pulse 71  Temp 97.8 F (36.6 C) (Oral)  Resp 16  Ht 5\' 1"  (1.549 m)  Wt 110 lb (49.896 kg)  BMI 20.78 kg/m2  SpO2 98%  Physical Exam  Vitals reviewed. Constitutional: She is oriented to person, place, and time. She appears well-developed and well-nourished.  HENT:  Head: Normocephalic.  Eyes: Pupils are equal, round, and reactive to light.  Neck: Normal range of motion. Neck supple.  Cardiovascular: Normal rate.   Pulmonary/Chest: Effort normal and breath sounds normal.  Abdominal: Soft. There is no tenderness.  Musculoskeletal:       Tenderness left hip. Leg is shortened and externally rotated. Neurovascular intact  distally. Dorsalis pedis pulse intact.  Neurological: She is alert and oriented to person, place, and time.  Skin: Skin is warm.    ED Course  Procedures (including critical care time)  Labs Reviewed  CBC WITH DIFFERENTIAL - Abnormal; Notable for the following:    RBC 3.10 (*)     Hemoglobin 10.1 (*)     HCT 30.1 (*)     Platelets 459 (*)     All other components within normal limits  PROTIME-INR - Abnormal; Notable for the following:    Prothrombin Time 15.4 (*)     All other components within normal limits  POCT I-STAT, CHEM 8 - Abnormal; Notable for the following:    BUN 28 (*)     Creatinine, Ser 1.50 (*)     Hemoglobin 10.5 (*)     HCT 31.0 (*)     All other components within  normal limits  URINALYSIS, ROUTINE W REFLEX MICROSCOPIC  APTT   Dg Chest 1 View  06/30/2012  *RADIOLOGY REPORT*  Clinical Data: Left hip dislocation  CHEST - 1 VIEW  Comparison: 06/16/2012  Findings: Cardiac enlargement without heart failure.  Resolution of edema seen on the prior study.  No infiltrate or effusion is present.  Dual lead pacemaker in satisfactory position. Right shoulder replacement.  Severe degenerative change in the left shoulder.  IMPRESSION: Resolution of heart failure.  No acute cardiopulmonary disease is identified.   Original Report Authenticated By: Camelia Phenes, M.D.    Dg Hip Complete Left  06/30/2012  *RADIOLOGY REPORT*  Clinical Data: Left hip pain.  Hip replacement 2 weeks ago  LEFT HIP - COMPLETE 2+ VIEW  Comparison: 03/12/2012  Findings: Left hip replacement is dislocated superiorly.  No fracture is identified.  There is arterial calcification in the femoral artery.  IMPRESSION: Superior dislocation of left hip arthroplasty.   Original Report Authenticated By: Camelia Phenes, M.D.    Dg Hip Portable 1 View Left  06/30/2012  *RADIOLOGY REPORT*  Clinical Data: Closed reduction of hip dislocation.  PORTABLE LEFT HIP - 1 VIEW  Comparison: Single view of pelvis 06/30/2012 at 1650 hours.  Findings: The left hip is now located.  No fracture is identified. Atherosclerosis noted.  IMPRESSION: Reduction of left total hip without evidence of complication.   Original Report Authenticated By: Bernadene Bell. D'ALESSIO, M.D.      1. Dislocation of hip prosthesis   2. Atrial fibrillation     Date: 07/01/2012  Rate: 77  Rhythm: electronically paced  QRS Axis: normal  Intervals: paced  ST/T Wave abnormalities: paced  Conduction Disutrbances:paced  Narrative Interpretation:   Old EKG Reviewed: unchanged     MDM  Dislocation of left hip prosthesis. History of same. It was placed 2 weeks ago. She was taken to the OR for reduction.        Juliet Rude. Rubin Payor,  MD 07/01/12 1610

## 2012-06-30 NOTE — Anesthesia Preprocedure Evaluation (Addendum)
Anesthesia Evaluation  Patient identified by MRN, date of birth, ID band Patient awake    Reviewed: Allergy & Precautions, H&P , NPO status , Patient's Chart, lab work & pertinent test results  History of Anesthesia Complications (+) PONV  Airway Mallampati: II TM Distance: >3 FB Neck ROM: Full    Dental No notable dental hx. (+) Teeth Intact, Poor Dentition and Dental Advisory Given   Pulmonary neg pulmonary ROS,  breath sounds clear to auscultation  Pulmonary exam normal       Cardiovascular negative cardio ROS  + dysrhythmias Atrial Fibrillation + pacemaker Rhythm:Regular Rate:Normal  ECG and CXR reviewed. Guidant pacemaker for sinus node dysfunction.   Neuro/Psych Anxiety Syncope    GI/Hepatic negative GI ROS, Neg liver ROS,   Endo/Other  negative endocrine ROS  Renal/GU Renal diseaseFrequency. Creatinine elevated at 1.33  negative genitourinary   Musculoskeletal negative musculoskeletal ROS (+)   Abdominal   Peds negative pediatric ROS (+)  Hematology negative hematology ROS (+)   Anesthesia Other Findings   Reproductive/Obstetrics negative OB ROS                         Anesthesia Physical Anesthesia Plan  ASA: III  Anesthesia Plan: General   Post-op Pain Management:    Induction: Intravenous  Airway Management Planned: Oral ETT  Additional Equipment:   Intra-op Plan:   Post-operative Plan: Extubation in OR  Informed Consent: I have reviewed the patients History and Physical, chart, labs and discussed the procedure including the risks, benefits and alternatives for the proposed anesthesia with the patient or authorized representative who has indicated his/her understanding and acceptance.   Dental advisory given  Plan Discussed with: CRNA  Anesthesia Plan Comments:         Anesthesia Quick Evaluation

## 2012-06-30 NOTE — Brief Op Note (Signed)
06/30/2012  [facility]  MRN: [id number] CSN: [id number]  8:02 PM  PATIENT:  Sophia Oliver  78 y.o. female  PRE-OPERATIVE DIAGNOSIS:  Dislocated left total hip replacement  POST-OPERATIVE DIAGNOSIS:  Dislocated left total hip replacement  PROCEDURE:  Procedure(s) (LRB): CLOSED reduction of left total HIP (Left)  SURGEON:  Surgeon(s) and Role:    * [name], MD - Primary    * Javier Docker, MD - Assisting  PHYSICIAN ASSISTANT: None  ANESTHESIA:   general  EBL:  Total I/O In: 500 [I.V.:500] Out: -   BLOOD ADMINISTERED:none  DRAINS: none   LOCAL MEDICATIONS USED:  NONE  SPECIMEN:  No Specimen  DISPOSITION OF SPECIMEN:  N/A  COUNTS:  NO closed procedure  TOURNIQUET:  * No tourniquets in log *  DICTATION: .Other Dictation: Dictation Number [id number]  PLAN OF CARE: Admit for overnight observation  PATIENT DISPOSITION:  PACU - hemodynamically stable.   Delay start of Pharmacological VTE agent (>24hrs) due to surgical blood loss or risk of bleeding: not applicable

## 2012-06-30 NOTE — Interval H&P Note (Signed)
History and Physical Interval Note:  06/30/2012 6:11 PM  Sophia Oliver  has presented today for surgery, with the diagnosis of dislocated left total hip  The various methods of treatment have been discussed with the patient and family. After consideration of risks, benefits and other options for treatment, the patient has consented to  Procedure(s) (LRB): CLOSED MANIPULATION HIP (Left) as a surgical intervention .  The patient's history has been reviewed, patient examined, no change in status, stable for surgery.  I have reviewed the patient's chart and labs.  Questions were answered to the patient's satisfaction.     Ameria Sanjurjo C

## 2012-06-30 NOTE — ED Notes (Signed)
Pt BIB EMS. Pt from [facility]. Pt has had total L hip replacement on 8/13. Pt had hip x-rayed today and prosthesis is out of joint. Pt denies falling or trauma to hip. Pt has rotation of L leg per EMS. Pt a/o x 3 per EMS.

## 2012-06-30 NOTE — ED Notes (Signed)
Dr Pickering at bedside 

## 2012-07-01 ENCOUNTER — Encounter (HOSPITAL_COMMUNITY): Payer: Self-pay | Admitting: Orthopedic Surgery

## 2012-07-01 MED ORDER — HYDROCODONE-ACETAMINOPHEN 5-325 MG PO TABS: 1.0000 | ORAL_TABLET | ORAL | Status: AC | PRN

## 2012-07-01 MED ORDER — METHOCARBAMOL 500 MG PO TABS: 500.0000 mg | ORAL_TABLET | Freq: Four times a day (QID) | ORAL | Status: AC | PRN

## 2012-07-01 NOTE — Op Note (Signed)
Sophia Oliver, Sophia Oliver NO.:  [id number]  MEDICAL RECORD NO.:  [id number]  LOCATION:  1602                         FACILITY:  [facility]  PHYSICIAN:  Madlyn Frankel. Charlann Boxer, M.D.  DATE OF BIRTH:  1924/07/29  DATE OF PROCEDURE:  06/30/2012 DATE OF DISCHARGE:                              OPERATIVE REPORT   PREOPERATIVE DIAGNOSIS:  Dislocated left total hip.  POSTOPERATIVE DIAGNOSIS:  Dislocated left total hip.  PROCEDURE:  Closed reduction of left total hip.  SURGEON:  Madlyn Frankel. Charlann Boxer, MD  ASSISTANT:  Jene Every, MD  ANESTHESIA:  General.  SPECIMENS:  None.  COMPLICATION:  None.  INDICATIONS FOR PROCEDURE:  Sophia Oliver is an 78 year old female, who I had recently performed a left total hip replacement on June 16, 2012.  She was discharged to a nursing facility.  She reports that since she had been there, she has had increasing pain and discomfort.  Her son feels that he has seen her leg rotated in, so it was not exactly certain that there was any frightened incident.  She did not recall any falls or aggravation of the hip while in the hospital.  None were reported to [place] at our office.  Pathology was seen evaluated in the emergency room by Dr. Jene Every, who is on-call for [place] today.  I happened to be in the operating room and reviewed the case with him and was available for closed reduction.  I had a chance to review with the patient and her son the current situation, the plan, and the objectives postoperatively.  Risks of recurrent dislocation needed for future surgery were discussed in addition to minimal risk of issues involving fracture at the time of reduction.  Consent was obtained for benefit of pain relief and reduction of the hip.  PROCEDURE IN DETAIL:  The patient was brought to the operative theater. Once adequate anesthesia was established, I got on to the patient's stretcher with downward pressure applied on her pelvis and  maintained stability.  I flexed the hip up and internally rotated the hip.  I took a decent amount of effort despite the induction, paralytic and effort to reduce the hip.  Nonetheless after some time, I was able to maintain this actual traction with internal rotation, feel the hip reduced.  When the hip was reduced, we are able to determine the leg lengths were equal, and that the leg no longer internally rotated.  She has a tendency for slight internal rotation, but this was significantly improved from the preoperative state.  I have gently assessed range of motion.  External rotation was excellent.  Internal rotation appeared to be within acceptable limits at 20-30 degrees.  I did not assess this to dislocation.  The knee immobilizer placed on the knee.  We did do a plane film radiographs in the room identifying a reduced left hip.  She was then extubated and brought to the recovery room in stable condition.  Given the time in the evening, she is going to be admitted overnight and return back to the nursing facility from which she came to continue work on therapy.  We will keep her partial weightbearing  on left lower extremity.  She will use a knee immobilizer at all times until further directed in our office.  I have her working on abduction exercises, trying to improve the strength in her left hip.  I have given the longstanding arthritic condition that she had in her hip.     Madlyn Frankel Charlann Boxer, M.D.     MDO/MEDQ  D:  06/30/2012  T:  07/01/2012  Job:  [id number]

## 2012-07-01 NOTE — Progress Notes (Signed)
   Subjective: 1 Day Post-Op Procedure(s) (LRB): CLOSED MANIPULATION HIP (Left)   Patient reports pain as mild, fells better than prior to surgery. No events throughout the night. Ready to be discharged back to SNF.  Objective:   VITALS:   Filed Vitals:   07/01/12 0505  BP: 149/71  Pulse: 74  Temp: 97.5 F (36.4 C)  Resp: 20    Neurovascular intact Dorsiflexion/Plantar flexion intact No cellulitis present Compartment soft  LABS  Basename 06/30/12 1632 06/30/12 1620  HGB 10.5* 10.1*  HCT 31.0* 30.1*  WBC -- 9.4  PLT -- 459*     Basename 06/30/12 1632  NA 136  K 4.3  BUN 28*  CREATININE 1.50*  GLUCOSE 93     Assessment/Plan: 1 Day Post-Op Procedure(s) (LRB): CLOSED MANIPULATION HIP (Left)   Advance diet Up with therapy D/C IV fluids Discharge to SNF Follow up in 2 weeks at [facility]  Follow-up Information    Follow up with OLIN,Hamsa Laurich D in 2 weeks.   Contact information:   [facility] [address], [address] [place] [address] [phone]          Anastasio Auerbach. Oshay Stranahan   PAC  07/01/2012, 9:58 AM

## 2012-07-01 NOTE — Discharge Summary (Signed)
Physician Discharge Summary  Patient ID: Sophia Oliver MRN: [id number] DOB/AGE: 1924/07/02 78 y.o.  Admit date: 06/30/2012 Discharge date:  07/01/2012  Procedures:  Procedure(s) (LRB): CLOSED MANIPULATION HIP (Left)  Attending Physician:  Dr. Durene Romans   Admission Diagnoses:   Dislocated left hip prosthesis  Discharge Diagnoses:  Principal Problem:  *Dislocation of left hip prosthesis   HPI: Pt is a 78 y.o. female complaining of left hip pain s/p left total hip arthroplasty, posterior approach on 06/16/2012 per Dr. Charlann Boxer. Patient presented to the ER on 06/30/2012 with left hip pain and decreased motion.  X-rays revealed a dislocation of the left hip prosthesis. She was originally seen by Dr. Shelle Iron, but Dr. Charlann Boxer then saw the patient. Risks, benefits and expectations were discussed with the patient. Patient understand the risks, benefits and expectations and wishes to proceed with reduction of the left hip.   PCP: [name], MD   Discharged Condition: good  Hospital Course:  Patient underwent the above stated procedure on 06/30/2012. Patient tolerated the procedure well and brought to the recovery room in good condition and subsequently to the floor.  POD #1 BP: 149/71 ; Pulse: 74 ; Temp: 97.5 F (36.4 C) ; Resp: 20 Pt's foley was removed, as well as the hemovac drain removed. IV was changed to a saline lock. Patient reports pain as mild, fells better than prior to surgery. No events throughout the night. Ready to be discharged back to SNF. Neurovascular intact, dorsiflexion/plantar flexion intact, no cellulitis present and compartment soft.   LABS  Basename  06/30/12 1632   HGB  10.5  HCT  31.0     Discharge Exam: General appearance: alert, cooperative and no distress Extremities: Homans sign is negative, no sign of DVT, no edema, redness or tenderness in the calves or thighs and no ulcers, gangrene or trophic changes  Disposition: Skilled Nursing Facility with  follow up in 2 weeks   Follow-up Information    Follow up with [name], MD. Schedule an appointment as soon as possible for a visit in 2 weeks.   Contact information:   [facility] [address], [address] [place] [address] [phone]          Discharge Orders    Future Orders Please Complete By Expires   Diet - low sodium heart healthy      Call MD / Call 911      Comments:   If you experience chest pain or shortness of breath, CALL 911 and be transported to the hospital emergency room.  If you develope a fever above 101 F, pus (white drainage) or increased drainage or redness at the wound, or calf pain, call your surgeon's office.   Discharge instructions      Comments:   Maintain knee immobilizer at all times.  Follow up in 2 weeks at [facility]. Call with any questions or concerns.  PT - patient is 50% WB. May take off knee immobilizer to work on ROM, strengthening (active abduction), other knee immobilizer should be in place.   Constipation Prevention      Comments:   Drink plenty of fluids.  Prune juice may be helpful.  You may use a stool softener, such as Colace (over the counter) 100 mg twice a day.  Use MiraLax (over the counter) for constipation as needed.   Increase activity slowly as tolerated      Driving restrictions      Comments:  No driving for 4 weeks   TED hose      Comments:   Use stockings (TED hose) for 2 weeks on both leg(s).  You may remove them at night for sleeping.      Current Discharge Medication List    CONTINUE these medications which have CHANGED   Details  HYDROcodone-acetaminophen (NORCO/VICODIN) 5-325 MG per tablet Take 1-2 tablets by mouth every 4 (four) hours as needed for pain. Qty: 100 tablet, Refills: 0    methocarbamol (ROBAXIN) 500 MG tablet Take 1 tablet (500 mg total) by mouth every 6 (six) hours as needed (muscle spasms). Qty: 50 tablet, Refills: 0      CONTINUE  these medications which have NOT CHANGED   Details  ALPRAZolam (XANAX) 0.25 MG tablet Take 0.25 mg by mouth 2 (two) times daily.     aspirin 81 MG chewable tablet Chew 81 mg by mouth daily.    cholecalciferol (VITAMIN D) 1000 UNITS tablet Take 1,000 Units by mouth daily.    dabigatran (PRADAXA) 75 MG CAPS Take 75 mg by mouth daily.  Qty: 60 capsule   Associated Diagnoses: Atrial fibrillation    digoxin (LANOXIN) 0.125 MG tablet Take 1 tablet (0.125 mg total) by mouth daily. Qty: 30 tablet, Refills: 0   Comments: To be followed up with Dr. Sharyn Lull    docusate sodium (COLACE) 100 MG capsule Take 100 mg by mouth 2 (two) times daily.    ferrous sulfate 325 (65 FE) MG tablet Take 1 tablet (325 mg total) by mouth 3 (three) times daily after meals.    fish oil-omega-3 fatty acids 1000 MG capsule Take 1 g by mouth daily.    metoprolol (LOPRESSOR) 50 MG tablet Take 50 mg by mouth 2 (two) times daily.    Nutritional Supplements (NUTRITIONAL DRINK PO) Take 120 mLs by mouth 2 (two) times daily. Medpass Butter Pecan.    pantoprazole (PROTONIX) 40 MG tablet Take 40 mg by mouth every morning.     polyethylene glycol (MIRALAX / GLYCOLAX) packet Take 17 g by mouth 2 (two) times daily.    promethazine (PHENERGAN) 25 MG tablet Take 12.5 mg by mouth every 6 (six) hours as needed. For nausea.    sitaGLIPtin (JANUVIA) 50 MG tablet Take 50 mg by mouth daily.    spironolactone (ALDACTONE) 25 MG tablet Take 25 mg by mouth every morning.         Signed: Anastasio Auerbach. Dhalia Zingaro   PAC  07/01/2012, 10:11 AM

## 2012-07-01 NOTE — Evaluation (Signed)
Physical Therapy Evaluation Patient Details Name: Sophia Oliver MRN: [id number] DOB: 08/15/1924 Today's Date: 07/01/2012 Time: 4098-1191 PT Time Calculation (min): 17 min  PT Assessment / Plan / Recommendation Clinical Impression  Pt s/p closed manipulation of L THR.  Pt would benefit from acute PT services in order to continue improving overall strength and mobility and maintaining posterior hip precautions to prepare for d/c back to SNF to complete rehab prior to home.    PT Assessment  Patient needs continued PT services    Follow Up Recommendations  Skilled nursing facility    Barriers to Discharge        Equipment Recommendations  Defer to next venue    Recommendations for Other Services     Frequency Min 6X/week    Precautions / Restrictions Precautions Precautions: Posterior Hip;Fall Precaution Comments: handout posted in pt's room Required Braces or Orthoses: Knee Immobilizer - Left Knee Immobilizer - Left: On at all times Restrictions Weight Bearing Restrictions: Yes LLE Weight Bearing: Partial weight bearing LLE Partial Weight Bearing Percentage or Pounds: <50%   Pertinent Vitals/Pain 8/10 L hip pain, repositioned, RN notified.      Mobility  Bed Mobility Supine to Sit: 1: +2 Total assist;HOB elevated;With rails Supine to Sit: Patient Percentage: 40% Details for Bed Mobility Assistance: assist for L LE, verbal cues for technique, some assist for trunk, increased use of UEs with rails Transfers Transfers: Sit to Stand;Stand to Sit;Stand Pivot Transfers Sit to Stand: 1: +2 Total assist;From bed Sit to Stand: Patient Percentage: 60% Stand to Sit: 1: +2 Total assist;To chair/3-in-1 Stand to Sit: Patient Percentage: 60% Stand Pivot Transfers: 1: +2 Total assist Stand Pivot Transfers: Patient Percentage: 60% Details for Transfer Assistance: assist to rise and stabilize, encouraged use of WBing through UEs with RW for stand pivot, verbal cues for step by step  instructions on turning/sequence, pt reports 8/10 pain with transfer    Exercises     PT Diagnosis: Difficulty walking;Acute pain  PT Problem List: Decreased strength;Decreased range of motion;Decreased activity tolerance;Decreased mobility;Decreased knowledge of precautions;Decreased knowledge of use of DME;Pain PT Treatment Interventions: DME instruction;Gait training;Functional mobility training;Therapeutic activities;Therapeutic exercise;Balance training;Patient/family education   PT Goals Acute Rehab PT Goals PT Goal Formulation: With patient Time For Goal Achievement: 07/08/12 Potential to Achieve Goals: Good Pt will go Supine/Side to Sit: with min assist PT Goal: Supine/Side to Sit - Progress: Goal set today Pt will go Sit to Supine/Side: with min assist PT Goal: Sit to Supine/Side - Progress: Goal set today Pt will go Sit to Stand: with min assist PT Goal: Sit to Stand - Progress: Goal set today Pt will go Stand to Sit: with min assist PT Goal: Stand to Sit - Progress: Goal set today Pt will Ambulate: with min assist PT Goal: Ambulate - Progress: Goal set today  Visit Information  Last PT Received On: 07/01/12 Assistance Needed: +2    Subjective Data  Subjective: I went to [place] and then came back.   Prior Functioning  Home Living Available Help at Discharge: Skilled Nursing Facility Additional Comments: Pt admitted from [facility] and plans to return there to continue rehab Prior Function Level of Independence: Needs assistance Comments: Pt requiring assistance with transfers at SNF with PT and remained in bed otherwise per pt report Communication Communication: No difficulties    Cognition  Overall Cognitive Status: Appears within functional limits for tasks assessed/performed Arousal/Alertness: Awake/alert Orientation Level: Appears intact for tasks assessed Behavior During Session: [facility] for tasks performed  Extremity/Trunk Assessment Right Upper  Extremity Assessment RUE ROM/Strength/Tone: Deficits RUE ROM/Strength/Tone Deficits: pt reports generalized weakness Left Upper Extremity Assessment LUE ROM/Strength/Tone: Deficits LUE ROM/Strength/Tone Deficits: pt reports generalized weakness Right Lower Extremity Assessment RLE ROM/Strength/Tone: Deficits RLE ROM/Strength/Tone Deficits: grossly at least 3+/5 per functional observation  Left Lower Extremity Assessment LLE ROM/Strength/Tone: Unable to fully assess;Due to pain;Due to precautions;Deficits LLE ROM/Strength/Tone Deficits: decreased active movement against gravity requiring assist with mobility   Balance    End of Session PT - End of Session Equipment Utilized During Treatment: Gait belt;Left knee immobilizer Activity Tolerance: Patient limited by pain Patient left: in chair;with call bell/phone within reach Nurse Communication: Patient requests pain meds  GP     Ervan Heber,KATHrine E 07/01/2012, 10:07 AM Pager: [phone]

## 2012-07-01 NOTE — Progress Notes (Signed)
Clinical Social Work Department BRIEF PSYCHOSOCIAL ASSESSMENT 07/01/2012  Patient:  Sophia Oliver, Sophia Oliver     Account Number:  [id number]     Admit date:  06/30/2012  Clinical Social Worker:  Candie Chroman  Date/Time:  07/01/2012 12:44 PM  Referred by:  Physician  Date Referred:  07/01/2012 Referred for  SNF Placement   Other Referral:   Interview type:  Patient Other interview type:    PSYCHOSOCIAL DATA Living Status:  FACILITY Admitted from facility:  [facility] Level of care:  Skilled Nursing Facility Primary support name:   Primary support relationship to patient:   Degree of support available:   supportive    CURRENT CONCERNS Current Concerns  Post-Acute Placement   Other Concerns:    SOCIAL WORK ASSESSMENT / PLAN Pt is an 78 yr old female admitted from [facility] . CSW met with pt/spouse to assist with d/c planning. today. Pt had surgery 8/26 for ( L ) hip dislocation. She plans to return to [place] today to resume her rehab. SNF has been contacted and is able to readmit today. [facility] will provide transportation.   Assessment/plan status:  Psychosocial Support/Ongoing Assessment of Needs Other assessment/ plan:   Information/referral to community resources:   None needed    PATIENT'S/FAMILY'S RESPONSE TO PLAN OF CARE: Pt is looking forward to returning to [facility] for continued rehab.    Cori Razor LCSW  [phone]

## 2012-07-23 ENCOUNTER — Ambulatory Visit (HOSPITAL_COMMUNITY)
Admission: RE | Admit: 2012-07-23 | Discharge: 2012-07-23 | Disposition: A | Discharge status: Home / Self Care | Payer: Medicare Other | Source: Ambulatory Visit | Attending: Internal Medicine | Admitting: Internal Medicine

## 2012-07-23 DIAGNOSIS — R1310 Dysphagia, unspecified: Secondary | ICD-10-CM

## 2012-07-23 DIAGNOSIS — R1313 Dysphagia, pharyngeal phase: Secondary | ICD-10-CM | POA: Insufficient documentation

## 2012-07-23 DIAGNOSIS — R1312 Dysphagia, oropharyngeal phase: Secondary | ICD-10-CM | POA: Insufficient documentation

## 2012-07-23 NOTE — Procedures (Signed)
Objective Swallowing Evaluation: Modified Barium Swallowing Study  Patient Details  Name: Sophia Oliver MRN: [id number] Date of Birth: [birth year]  Today's Date: 07/23/2012 Time: 1208-1230 SLP Time Calculation (min): 22 min  Past Medical History:  Past Medical History  Diagnosis Date  . Syncope   . Sinus node dysfunction   . S/P cardiac pacemaker procedure     Guidant  . Wide-complex tachycardia   . Cholelithiasis   . Glucose intolerance (impaired glucose tolerance)   . DJD (degenerative joint disease)   . Anxiety   . PONV (postoperative nausea and vomiting)   . Pacemaker   . Chronic kidney disease     frequency  . Breast cancer     left breast - hx of 2003   . Injury of right upper arm     after venipuncture in 3/13 - pt describes pain afterwards- no followup done seen by PCP- no further treatment    Past Surgical History:  Past Surgical History  Procedure Date  . S/p pacemaker   . Replacement total knee bilateral   . Total shoulder replacement   . Total hip arthroplasty   . Femur fracture surgery   . Hysterectomy, unspecified area   . Masterectomy   . Abdominal hysterectomy   . Breast surgery     left breast - 2003   . Total hip arthroplasty 06/16/2012    Procedure: TOTAL HIP ARTHROPLASTY;  Surgeon: [name], MD;  Location: [facility];  Service: Orthopedics;  Laterality: Left;  . Hip closed reduction 06/30/2012    Procedure: CLOSED MANIPULATION HIP;  Surgeon: [name], MD;  Location: [facility];  Service: Orthopedics;  Laterality: Left;   HPI:  Pt is an 78 year old female arriving for an outpatient MBS due to concerns from SLP at SNF that pt may be aspirating. Pt reports she often gets chocked and expectorates POs. She wakes itn eh middle of the night coughing. The says these symptoms have been ongoing but worsened during a recent hospitalization for surgery. She denies history of CVA, GERD or pna.      Assessment / Plan / Recommendation Clinical Impression  Clinical impression: Pt presents with a mild pharyngeal dysphagia with mild weakness of base of tongue, pharyngeal and laryngeal structures with sensory impairment. Incomplete airway closure results in trace penetration and trace silent aspiraiton with larger thin boluses (straws and with pills). Residuals remain in valleculae and pyriforms post swallow. With a throat clear and second swallowt he pt can expel penetrate/aspirate and clear residuals consistently. An esophageal sweep revealed only trace stasis in the distal esophagus. However, pts complaints (expectoration/regurgitation, choking with POs, coughing in the middle of the night, hoarse voice) and presentation of delayed coughign during study are all consistent with a primary esopahgeal dysphagia. Recommend pt continue a regular diet with thin liquids with a throat clear and second swallow with aspriation and esophageal precuations. Pt should refer to primary MD for esophageal assessment.     Treatment Recommendation  Defer treatment plan to SLP at (Comment) (SNF)    Diet Recommendation Regular;Thin liquid   Liquid Administration via: Cup;No straw Medication Administration: Whole meds with puree Supervision: Intermittent supervision to cue for compensatory strategies Compensations: Slow rate;Small sips/bites;Multiple dry swallows after each bite/sip;Clear throat intermittently;Follow solids with liquid Postural Changes and/or Swallow Maneuvers: Seated upright 90 degrees;Upright 30-60 min after meal    Other  Recommendations Recommended Consults: Consider esophageal assessment   Follow Up Recommendations  Skilled Nursing facility  Frequency and Duration        Pertinent Vitals/Pain NA    SLP Swallow Goals     General HPI: Pt is an 78 year old female arriving for an outpatient MBS due to concerns from SLP at SNF that pt may be aspirating. Pt reports she often gets chocked and expectorates POs. She wakes itn eh middle of the night  coughing. The says these symptoms have been ongoing but worsened during a recent hospitalization for surgery. She denies history of CVA, GERD or pna.  Type of Study: Modified Barium Swallowing Study Reason for Referral: Objectively evaluate swallowing function Diet Prior to this Study: Dysphagia 3 (soft);Thin liquids Behavior/Cognition: Alert;Cooperative;Pleasant mood Oral Cavity - Dentition: Adequate natural dentition Oral Motor / Sensory Function: Within functional limits Self-Feeding Abilities: Able to feed self Patient Positioning: Upright in chair Baseline Vocal Quality: Hoarse Volitional Cough: Strong Volitional Swallow: Able to elicit    Reason for Referral Objectively evaluate swallowing function   Oral Phase Oral Preparation/Oral Phase Oral Phase: Impaired Oral - Thin Oral - Thin Cup: Left anterior bolus loss;Right anterior bolus loss   Pharyngeal Phase Pharyngeal Phase Pharyngeal Phase: Impaired Pharyngeal - Thin Pharyngeal - Thin Cup: Premature spillage to valleculae;Reduced epiglottic inversion;Reduced anterior laryngeal mobility;Reduced laryngeal elevation;Reduced airway/laryngeal closure;Reduced tongue base retraction;Penetration/Aspiration during swallow;Trace aspiration;Pharyngeal residue - valleculae;Pharyngeal residue - pyriform sinuses Penetration/Aspiration details (thin cup): Material enters airway, CONTACTS cords and not ejected out;Material does not enter airway;Material enters airway, remains ABOVE vocal cords and not ejected out;Material enters airway, passes BELOW cords without attempt by patient to eject out (silent aspiration) Pharyngeal - Thin Straw: Premature spillage to valleculae;Reduced epiglottic inversion;Reduced anterior laryngeal mobility;Reduced laryngeal elevation;Reduced airway/laryngeal closure;Reduced tongue base retraction;Penetration/Aspiration during swallow;Trace aspiration;Pharyngeal residue - valleculae;Pharyngeal residue - pyriform  sinuses Penetration/Aspiration details (thin straw): Material enters airway, passes BELOW cords without attempt by patient to eject out (silent aspiration) Pharyngeal - Solids Pharyngeal - Puree: Reduced epiglottic inversion;Reduced anterior laryngeal mobility;Reduced laryngeal elevation;Reduced airway/laryngeal closure;Reduced tongue base retraction;Pharyngeal residue - valleculae;Pharyngeal residue - pyriform sinuses Pharyngeal - Mechanical Soft: Premature spillage to valleculae;Reduced epiglottic inversion;Reduced anterior laryngeal mobility;Reduced laryngeal elevation;Reduced airway/laryngeal closure;Reduced tongue base retraction;Penetration/Aspiration during swallow;Trace aspiration;Pharyngeal residue - valleculae;Pharyngeal residue - pyriform sinuses Penetration/Aspiration details (mechanical soft): Material does not enter airway Pharyngeal - Regular: Premature spillage to valleculae;Reduced epiglottic inversion;Reduced anterior laryngeal mobility;Reduced laryngeal elevation;Reduced airway/laryngeal closure;Reduced tongue base retraction;Penetration/Aspiration during swallow;Trace aspiration;Pharyngeal residue - valleculae;Pharyngeal residue - pyriform sinuses Penetration/Aspiration details (regular): Material does not enter airway Pharyngeal - Pill: Premature spillage to pyriform sinuses;Reduced epiglottic inversion;Reduced anterior laryngeal mobility;Reduced laryngeal elevation;Reduced airway/laryngeal closure;Reduced tongue base retraction;Penetration/Aspiration before swallow;Moderate aspiration;Pharyngeal residue - valleculae;Pharyngeal residue - pyriform sinuses Penetration/Aspiration details (pill): Material enters airway, passes BELOW cords then ejected out  Cervical Esophageal Phase    GO    Cervical Esophageal Phase Cervical Esophageal Phase: [facility]    Functional Assessment Tool Used: clinical judgement Functional Limitations: Swallowing Swallow Current Status (Z6109): At least  20 percent but less than 40 percent impaired, limited or restricted Swallow Discharge Status [phone]): At least 20 percent but less than 40 percent impaired, limited or restricted   [facility], [place] CCC-SLP [phone]  Claudine Mouton 07/23/2012, 2:54 PM

## 2012-09-09 ENCOUNTER — Encounter: Payer: Self-pay | Admitting: Internal Medicine

## 2012-09-09 ENCOUNTER — Encounter: Payer: Self-pay | Admitting: *Deleted

## 2012-09-09 ENCOUNTER — Ambulatory Visit (INDEPENDENT_AMBULATORY_CARE_PROVIDER_SITE_OTHER): Payer: Medicare Other | Admitting: *Deleted

## 2012-09-09 DIAGNOSIS — I495 Sick sinus syndrome: Secondary | ICD-10-CM

## 2012-09-09 LAB — PACEMAKER DEVICE OBSERVATION
AL AMPLITUDE: 0.75 mv
BAMS-0002: 8 ms
BAMS-0003: 70 {beats}/min
BRDY-0002RA: 40 {beats}/min
DEVICE MODEL PM: 313770
RV LEAD AMPLITUDE: 7.3 mv

## 2012-09-09 NOTE — Progress Notes (Signed)
PPM check 

## 2012-11-05 DIAGNOSIS — I495 Sick sinus syndrome: Secondary | ICD-10-CM

## 2012-11-19 ENCOUNTER — Encounter (HOSPITAL_COMMUNITY): Payer: Self-pay | Admitting: *Deleted

## 2012-11-19 ENCOUNTER — Inpatient Hospital Stay (HOSPITAL_COMMUNITY)
Admission: EM | Admit: 2012-11-19 | Discharge: 2012-11-25 | DRG: 312 | Disposition: A | Discharge status: Skilled Nursing Facility | Payer: Medicare Other | Attending: Internal Medicine | Admitting: Internal Medicine

## 2012-11-19 ENCOUNTER — Emergency Department (HOSPITAL_COMMUNITY): Payer: Medicare Other

## 2012-11-19 DIAGNOSIS — R55 Syncope and collapse: Principal | ICD-10-CM | POA: Diagnosis present

## 2012-11-19 DIAGNOSIS — I5042 Chronic combined systolic (congestive) and diastolic (congestive) heart failure: Secondary | ICD-10-CM | POA: Diagnosis present

## 2012-11-19 DIAGNOSIS — I495 Sick sinus syndrome: Secondary | ICD-10-CM | POA: Diagnosis present

## 2012-11-19 DIAGNOSIS — I509 Heart failure, unspecified: Secondary | ICD-10-CM | POA: Diagnosis present

## 2012-11-19 DIAGNOSIS — T443X5A Adverse effect of other parasympatholytics [anticholinergics and antimuscarinics] and spasmolytics, initial encounter: Secondary | ICD-10-CM | POA: Diagnosis present

## 2012-11-19 DIAGNOSIS — E875 Hyperkalemia: Secondary | ICD-10-CM | POA: Diagnosis present

## 2012-11-19 DIAGNOSIS — M8448XA Pathological fracture, other site, initial encounter for fracture: Secondary | ICD-10-CM | POA: Diagnosis present

## 2012-11-19 DIAGNOSIS — T82110A Breakdown (mechanical) of cardiac electrode, initial encounter: Secondary | ICD-10-CM

## 2012-11-19 DIAGNOSIS — Z79899 Other long term (current) drug therapy: Secondary | ICD-10-CM

## 2012-11-19 DIAGNOSIS — D72829 Elevated white blood cell count, unspecified: Secondary | ICD-10-CM | POA: Diagnosis present

## 2012-11-19 DIAGNOSIS — I4891 Unspecified atrial fibrillation: Secondary | ICD-10-CM | POA: Diagnosis present

## 2012-11-19 DIAGNOSIS — N183 Chronic kidney disease, stage 3 unspecified: Secondary | ICD-10-CM | POA: Diagnosis present

## 2012-11-19 DIAGNOSIS — Z950 Presence of cardiac pacemaker: Secondary | ICD-10-CM

## 2012-11-19 DIAGNOSIS — E860 Dehydration: Secondary | ICD-10-CM | POA: Diagnosis present

## 2012-11-19 DIAGNOSIS — R791 Abnormal coagulation profile: Secondary | ICD-10-CM | POA: Diagnosis present

## 2012-11-19 DIAGNOSIS — N390 Urinary tract infection, site not specified: Secondary | ICD-10-CM | POA: Diagnosis present

## 2012-11-19 DIAGNOSIS — B961 Klebsiella pneumoniae [K. pneumoniae] as the cause of diseases classified elsewhere: Secondary | ICD-10-CM | POA: Diagnosis present

## 2012-11-19 LAB — URINE MICROSCOPIC-ADD ON

## 2012-11-19 LAB — COMPREHENSIVE METABOLIC PANEL
AST: 14 U/L (ref 0–37)
Albumin: 3.6 g/dL (ref 3.5–5.2)
BUN: 38 mg/dL — ABNORMAL HIGH (ref 6–23)
Calcium: 9.8 mg/dL (ref 8.4–10.5)
Creatinine, Ser: 1.38 mg/dL — ABNORMAL HIGH (ref 0.50–1.10)
Total Bilirubin: 0.5 mg/dL (ref 0.3–1.2)
Total Protein: 6.4 g/dL (ref 6.0–8.3)

## 2012-11-19 LAB — TROPONIN I: Troponin I: <0.3 ng/mL (ref <0.30)

## 2012-11-19 LAB — CBC
HCT: 38.5 % (ref 36.0–46.0)
MCH: 31.3 pg (ref 26.0–34.0)
MCHC: 33.5 g/dL (ref 30.0–36.0)
MCV: 93.4 fL (ref 78.0–100.0)
Platelets: 276 10*3/uL (ref 150–400)
RDW: 14.7 % (ref 11.5–15.5)

## 2012-11-19 LAB — PRO B NATRIURETIC PEPTIDE: Pro B Natriuretic peptide (BNP): 910.2 pg/mL — ABNORMAL HIGH (ref 0–450)

## 2012-11-19 LAB — TYPE AND SCREEN: DAT, IgG: NEGATIVE

## 2012-11-19 LAB — URINALYSIS, ROUTINE W REFLEX MICROSCOPIC
Bilirubin Urine: NEGATIVE
Nitrite: NEGATIVE
Specific Gravity, Urine: 1.008 (ref 1.005–1.030)
Urobilinogen, UA: 0.2 mg/dL (ref 0.0–1.0)

## 2012-11-19 LAB — DIGOXIN LEVEL: Digoxin Level: <0.3 ng/mL — ABNORMAL LOW (ref 0.8–2.0)

## 2012-11-19 LAB — PROTIME-INR: INR: 1.56 — ABNORMAL HIGH (ref 0.00–1.49)

## 2012-11-19 MED ORDER — METOPROLOL TARTRATE 50 MG PO TABS
50.0000 mg | ORAL_TABLET | Freq: Two times a day (BID) | ORAL | Status: DC
  Administered 2012-11-19 – 2012-11-25 (×7): 50 mg via ORAL
  Filled 2012-11-19 (×13): qty 1

## 2012-11-19 MED ORDER — LORAZEPAM 2 MG/ML IJ SOLN
0.5000 mg | Freq: Once | INTRAMUSCULAR | Status: AC
  Administered 2012-11-19: 0.5 mg via INTRAVENOUS
  Filled 2012-11-19: qty 1

## 2012-11-19 MED ORDER — DEXTROSE 5 % IV SOLN
1.0000 g | INTRAVENOUS | Status: DC
  Administered 2012-11-19 – 2012-11-23 (×5): 1 g via INTRAVENOUS
  Filled 2012-11-19 (×5): qty 10

## 2012-11-19 MED ORDER — SODIUM CHLORIDE 0.9 % IV SOLN
INTRAVENOUS | Status: DC
  Administered 2012-11-19: 20 mL/h via INTRAVENOUS

## 2012-11-19 MED ORDER — SODIUM CHLORIDE 0.9 % IV SOLN
INTRAVENOUS | Status: AC
  Administered 2012-11-19: 15:00:00 via INTRAVENOUS

## 2012-11-19 MED ORDER — ALPRAZOLAM 0.25 MG PO TABS
0.2500 mg | ORAL_TABLET | Freq: Two times a day (BID) | ORAL | Status: DC
  Administered 2012-11-19 – 2012-11-25 (×12): 0.25 mg via ORAL
  Filled 2012-11-19 (×13): qty 1

## 2012-11-19 MED ORDER — SODIUM CHLORIDE 0.9 % IV SOLN
INTRAVENOUS | Status: DC
  Administered 2012-11-19: 21:00:00 via INTRAVENOUS

## 2012-11-19 MED ORDER — SPIRONOLACTONE 25 MG PO TABS
25.0000 mg | ORAL_TABLET | Freq: Every morning | ORAL | Status: DC
  Administered 2012-11-20 – 2012-11-21 (×2): 25 mg via ORAL
  Filled 2012-11-19 (×3): qty 1

## 2012-11-19 MED ORDER — ACETAMINOPHEN 325 MG PO TABS
650.0000 mg | ORAL_TABLET | Freq: Four times a day (QID) | ORAL | Status: DC | PRN
  Administered 2012-11-22 – 2012-11-24 (×2): 650 mg via ORAL
  Filled 2012-11-19 (×2): qty 2

## 2012-11-19 MED ORDER — SODIUM CHLORIDE 0.9 % IJ SOLN
3.0000 mL | Freq: Two times a day (BID) | INTRAMUSCULAR | Status: DC
  Administered 2012-11-19 – 2012-11-25 (×9): 3 mL via INTRAVENOUS

## 2012-11-19 MED ORDER — ONDANSETRON HCL 4 MG/2ML IJ SOLN: 4.0000 mg | Freq: Four times a day (QID) | INTRAMUSCULAR | Status: DC | PRN

## 2012-11-19 MED ORDER — VITAMIN D3 25 MCG (1000 UNIT) PO TABS
1000.0000 [IU] | ORAL_TABLET | Freq: Every day | ORAL | Status: DC
  Administered 2012-11-19 – 2012-11-25 (×7): 1000 [IU] via ORAL
  Filled 2012-11-19 (×7): qty 1

## 2012-11-19 MED ORDER — DICLOFENAC SODIUM 1 % TD GEL
2.0000 g | Freq: Four times a day (QID) | TRANSDERMAL | Status: DC
  Administered 2012-11-19 – 2012-11-25 (×21): 2 g via TOPICAL
  Filled 2012-11-19: qty 100

## 2012-11-19 MED ORDER — DABIGATRAN ETEXILATE MESYLATE 75 MG PO CAPS
75.0000 mg | ORAL_CAPSULE | Freq: Two times a day (BID) | ORAL | Status: DC
  Administered 2012-11-19 – 2012-11-25 (×12): 75 mg via ORAL
  Filled 2012-11-19 (×13): qty 1

## 2012-11-19 MED ORDER — ACETAMINOPHEN 650 MG RE SUPP: 650.0000 mg | Freq: Four times a day (QID) | RECTAL | Status: DC | PRN

## 2012-11-19 MED ORDER — OMEGA-3 FATTY ACIDS 1000 MG PO CAPS: 1.0000 g | ORAL_CAPSULE | Freq: Every day | ORAL | Status: DC

## 2012-11-19 MED ORDER — DIGOXIN 125 MCG PO TABS
0.1250 mg | ORAL_TABLET | Freq: Every day | ORAL | Status: DC
  Administered 2012-11-19 – 2012-11-25 (×4): 0.125 mg via ORAL
  Filled 2012-11-19 (×7): qty 1

## 2012-11-19 MED ORDER — MORPHINE SULFATE 2 MG/ML IJ SOLN
2.0000 mg | Freq: Once | INTRAMUSCULAR | Status: AC
  Administered 2012-11-19: 2 mg via INTRAVENOUS
  Filled 2012-11-19: qty 1

## 2012-11-19 MED ORDER — ONDANSETRON HCL 4 MG PO TABS
4.0000 mg | ORAL_TABLET | Freq: Four times a day (QID) | ORAL | Status: DC | PRN
  Administered 2012-11-24: 4 mg via ORAL
  Filled 2012-11-19: qty 1

## 2012-11-19 MED ORDER — HYDROCODONE-ACETAMINOPHEN 5-325 MG PO TABS
1.0000 | ORAL_TABLET | Freq: Four times a day (QID) | ORAL | Status: DC | PRN
  Administered 2012-11-20 – 2012-11-25 (×10): 1 via ORAL
  Filled 2012-11-19 (×12): qty 1

## 2012-11-19 MED ORDER — SODIUM CHLORIDE 0.9 % IV BOLUS (SEPSIS)
500.0000 mL | Freq: Once | INTRAVENOUS | Status: AC
  Administered 2012-11-19: 500 mL via INTRAVENOUS

## 2012-11-19 MED ORDER — OMEGA-3-ACID ETHYL ESTERS 1 G PO CAPS
1.0000 g | ORAL_CAPSULE | Freq: Every day | ORAL | Status: DC
  Administered 2012-11-19 – 2012-11-25 (×7): 1 g via ORAL
  Filled 2012-11-19 (×7): qty 1

## 2012-11-19 MED ORDER — MORPHINE SULFATE 2 MG/ML IJ SOLN: 1.0000 mg | INTRAMUSCULAR | Status: DC | PRN

## 2012-11-19 NOTE — ED Notes (Signed)
ZOX:[id number]<AV> Expected date:11/19/12<BR> Expected time:11:07 AM<BR> Means of arrival:Ambulance<BR> Comments:<BR> Elderly/fall

## 2012-11-19 NOTE — ED Notes (Signed)
Pt transported via stretcher on cardiac monitor accompanied by family and this nurse with chart and personal belongings, condition stable at time of transfer.

## 2012-11-19 NOTE — ED Notes (Signed)
Per EMS pt has been on oxybutynin x 1 week for urinary incont, since then has been having dizzy spells, called and saw MD and was told to stop taking it yesterday, this morning walking in kitchen got dizzy and fell, denies LOC, complaining of L hip pain, hip replacement 3 months ago on L hip, no deformity noted, range of motion normal. Pt is anxious, shaking. Still complaining of dizziness. BP 161/87, HR 118, 97% RA, lungs clear. CBG 87.

## 2012-11-19 NOTE — ED Notes (Signed)
Pt states she has been on oxybutynin for urinary problems, started having dizziness w/ mediation, saw PCP yesterday and was told to stop taking medication, this morning became dizzy in kitchen, fell over walker and hit cabinets. Pt complaining of L hip pain, no deformity/shortening noted, able to move w/o difficulty. Pt anxious at this time, shaking, states "I do this sometimes". Pt taken off back board w/ 4 person assist, ccollar intact.

## 2012-11-19 NOTE — ED Provider Notes (Addendum)
History     CSN: [id number]  Arrival date & time 11/19/12  1121   First MD Initiated Contact with Patient 11/19/12 1209      Chief Complaint  Patient presents with  . Dizziness  . Fall    (Consider location/radiation/quality/duration/timing/severity/associated sxs/prior treatment) Patient is a 79 y.o. female presenting with fall. The history is provided by the patient.  Fall Pertinent negatives include no fever, no abdominal pain and no headaches.  pt c/o feeling lightheaded for past 1-2 weeks. Episodic. States worse when stands from sitting or lying position.  States only new med during this period was oxybutynin which her pcp stopped yesterday when he saw her for these same symptoms. Denies vertigo or room spinning. States today fell, ?near syncopal vs very brief syncopal event. Denies any palpitations. No chest pain or discomfort.  No sob. No cough or uri c/o. No fever or chills. Is eating/drinking. No abd pain. No nvd. No gu c/o. No rectal bleeding or melena. Post fall also c/o left hip pain, constant, dull, mild.      Past Medical History  Diagnosis Date  . Syncope   . Sinus node dysfunction   . S/P cardiac pacemaker procedure     Guidant  . Wide-complex tachycardia   . Cholelithiasis   . Glucose intolerance (impaired glucose tolerance)   . DJD (degenerative joint disease)   . Anxiety   . PONV (postoperative nausea and vomiting)   . Pacemaker   . Chronic kidney disease     frequency  . Breast cancer     left breast - hx of 2003   . Injury of right upper arm     after venipuncture in 3/13 - pt describes pain afterwards- no followup done seen by PCP- no further treatment     Past Surgical History  Procedure Date  . S/p pacemaker   . Replacement total knee bilateral   . Total shoulder replacement   . Total hip arthroplasty   . Femur fracture surgery   . Hysterectomy, unspecified area   . Masterectomy   . Abdominal hysterectomy   . Breast surgery     left  breast - 2003   . Total hip arthroplasty 06/16/2012    Procedure: TOTAL HIP ARTHROPLASTY;  Surgeon: [name], MD;  Location: [facility];  Service: Orthopedics;  Laterality: Left;  . Hip closed reduction 06/30/2012    Procedure: CLOSED MANIPULATION HIP;  Surgeon: [name], MD;  Location: [facility];  Service: Orthopedics;  Laterality: Left;    No family history on file.  History  Substance Use Topics  . Smoking status: Former Smoker    Quit date: 11/04/1948  . Smokeless tobacco: Never Used  . Alcohol Use: No    OB History    Grav Para Term Preterm Abortions TAB SAB Ect Mult Living                  Review of Systems  Constitutional: Negative for fever and chills.  HENT: Negative for neck pain and neck stiffness.   Eyes: Negative for redness and visual disturbance.  Respiratory: Negative for cough and shortness of breath.   Cardiovascular: Negative for chest pain, palpitations and leg swelling.  Gastrointestinal: Negative for abdominal pain and blood in stool.  Genitourinary: Negative for dysuria and flank pain.  Musculoskeletal: Negative for back pain.  Skin: Negative for rash.  Neurological: Positive for light-headedness. Negative for headaches.  Hematological: Does not bruise/bleed easily.  Psychiatric/Behavioral: Negative for  confusion.    Allergies  Codeine and Other  Home Medications   Current Outpatient Rx  Name  Route  Sig  Dispense  Refill  . ALPRAZOLAM 0.25 MG PO TABS   Oral   Take 0.25 mg by mouth 2 (two) times daily.          [name] VITAMIN D 1000 UNITS PO TABS   Oral   Take 1,000 Units by mouth daily.         [name] CINNAMON 500 MG PO CAPS   Oral   Take 500 mg by mouth every morning.         [name] DABIGATRAN ETEXILATE MESYLATE 75 MG PO CAPS   Oral   Take 75 mg by mouth 2 (two) times daily.    60 capsule      . DIGOXIN 0.125 MG PO TABS   Oral   Take 1 tablet (0.125 mg total) by mouth daily.   30 tablet   0     To be followed up with Dr.  Sharyn Lull   . OMEGA-3 FATTY ACIDS 1000 MG PO CAPS   Oral   Take 1 g by mouth daily.         [name] HYDROCODONE-ACETAMINOPHEN 5-325 MG PO TABS   Oral   Take 1 tablet by mouth every 6 (six) hours as needed. pain         . METOPROLOL TARTRATE 50 MG PO TABS   Oral   Take 50 mg by mouth 2 (two) times daily.         [name] SPIRONOLACTONE 25 MG PO TABS   Oral   Take 25 mg by mouth every morning.           BP 100/58  Pulse 123  Temp 97.6 F (36.4 C) (Rectal)  Resp 14  SpO2 100%  Physical Exam  Nursing note and vitals reviewed. Constitutional: She is oriented to person, place, and time. She appears well-developed and well-nourished. No distress.  HENT:  Mouth/Throat: Oropharynx is clear and moist.       Tenderness posterior scalp.   Eyes: Conjunctivae normal are normal. Pupils are equal, round, and reactive to light. No scleral icterus.  Neck: Neck supple. No tracheal deviation present.       No bruit  Cardiovascular: Normal heart sounds and intact distal pulses.  Exam reveals no gallop and no friction rub.   No murmur heard.      Tachycardic, irregular  Pulmonary/Chest: Effort normal and breath sounds normal. No respiratory distress. She exhibits no tenderness.  Abdominal: Soft. Normal appearance and bowel sounds are normal. She exhibits no distension and no mass. There is no tenderness. There is no rebound and no guarding.       No puls mass  Genitourinary:       No cva tenderness  Musculoskeletal: Normal range of motion. She exhibits no edema and no tenderness.       Tenderness left hip. Good rom.  Mid cervical tenderness, otherwise CTLS spine, non tender, aligned, no step off. No other focal bony tenderness on bil ext exam.   Neurological: She is alert and oriented to person, place, and time.       Motor intact bil.   Skin: Skin is warm and dry. No rash noted.  Psychiatric:       anxious    ED Course  Procedures (including critical care time)   Results for orders  placed during the hospital encounter of 11/19/12  CBC  Component Value Range   WBC 15.8 (*) 4.0 - 10.5 K/uL   RBC 4.12  3.87 - 5.11 MIL/uL   Hemoglobin 12.9  12.0 - 15.0 g/dL   HCT 40.9  81.1 - 91.4 %   MCV 93.4  78.0 - 100.0 fL   MCH 31.3  26.0 - 34.0 pg   MCHC 33.5  30.0 - 36.0 g/dL   RDW 78.2  95.6 - 21.3 %   Platelets 276  150 - 400 K/uL  COMPREHENSIVE METABOLIC PANEL      Component Value Range   Sodium 140  135 - 145 mEq/L   Potassium 4.8  3.5 - 5.1 mEq/L   Chloride 106  96 - 112 mEq/L   CO2 17 (*) 19 - 32 mEq/L   Glucose, Bld 106 (*) 70 - 99 mg/dL   BUN 38 (*) 6 - 23 mg/dL   Creatinine, Ser 0.86 (*) 0.50 - 1.10 mg/dL   Calcium 9.8  8.4 - 57.8 mg/dL   Total Protein 6.4  6.0 - 8.3 g/dL   Albumin 3.6  3.5 - 5.2 g/dL   AST 14  0 - 37 U/L   ALT 9  0 - 35 U/L   Alkaline Phosphatase 126 (*) 39 - 117 U/L   Total Bilirubin 0.5  0.3 - 1.2 mg/dL   GFR calc non Af Amer 34 (*) >90 mL/min   GFR calc Af Amer 39 (*) >90 mL/min  PRO B NATRIURETIC PEPTIDE      Component Value Range   Pro B Natriuretic peptide (BNP) 910.2 (*) 0 - 450 pg/mL  TROPONIN I      Component Value Range   Troponin I <0.30  <0.30 ng/mL  URINALYSIS, ROUTINE W REFLEX MICROSCOPIC      Component Value Range   Color, Urine YELLOW  YELLOW   APPearance TURBID (*) CLEAR   Specific Gravity, Urine 1.008  1.005 - 1.030   pH 5.5  5.0 - 8.0   Glucose, UA NEGATIVE  NEGATIVE mg/dL   Hgb urine dipstick MODERATE (*) NEGATIVE   Bilirubin Urine NEGATIVE  NEGATIVE   Ketones, ur TRACE (*) NEGATIVE mg/dL   Protein, ur 30 (*) NEGATIVE mg/dL   Urobilinogen, UA 0.2  0.0 - 1.0 mg/dL   Nitrite NEGATIVE  NEGATIVE   Leukocytes, UA LARGE (*) NEGATIVE  PROTIME-INR      Component Value Range   Prothrombin Time 18.2 (*) 11.6 - 15.2 seconds   INR 1.56 (*) 0.00 - 1.49  TYPE AND SCREEN      Component Value Range   ABO/RH(D) A POS     Antibody Screen POS     Sample Expiration 11/22/2012     DAT, IgG NEG    DIGOXIN LEVEL       Component Value Range   Digoxin Level <0.3 (*) 0.8 - 2.0 ng/mL  URINE MICROSCOPIC-ADD ON      Component Value Range   Squamous Epithelial / LPF FEW (*) RARE   WBC, UA TOO NUMEROUS TO COUNT  <3 WBC/hpf   RBC / HPF FIELD OBSCURED BY WBC'S  <3 RBC/hpf   Bacteria, UA MANY (*) RARE   Dg Chest 2 View  11/19/2012  *RADIOLOGY REPORT*  Clinical Data: Fall, shortness of breath  CHEST - 2 VIEW  Comparison: 06/30/2012  Findings: Chronic interstitial markings/emphysematous changes.  No focal consolidation. No pleural effusion or pneumothorax.  Lucency along the right lateral chest wall reflects a skin fold.  The heart is normal in  size.  Right subclavian pacemaker.  Chronic degenerative changes of the left shoulder/humeral head. Prior right shoulder arthroplasty.  Mild degenerative changes of the visualized thoracolumbar spine.  IMPRESSION: No evidence of acute cardiopulmonary disease.  Chronic interstitial markings/emphysematous changes.   Original Report Authenticated By: Charline Bills, M.D.    Dg Hip Complete Left  11/19/2012  *RADIOLOGY REPORT*  Clinical Data: Fall, left hip pain.  LEFT HIP - COMPLETE 2+ VIEW  Comparison: 06/30/2012  Findings: Changes of left hip replacement.  Diffuse osteopenia.  No hardware or acute bony abnormality.  No acute fracture, subluxation or dislocation.  Probable old healed left inferior pubic ramus fracture.  IMPRESSION: Left replacement.  No acute bony abnormality.   Original Report Authenticated By: Charlett Nose, M.D.    Ct Head Wo Contrast  11/19/2012  *RADIOLOGY REPORT*  Clinical Data:  Dizziness, fall, hit head  CT HEAD WITHOUT CONTRAST CT CERVICAL SPINE WITHOUT CONTRAST  Technique:  Multidetector CT imaging of the head and cervical spine was performed following the standard protocol without intravenous contrast.  Multiplanar CT image reconstructions of the cervical spine were also generated.  Comparison:  None.  CT HEAD  Findings: No evidence of parenchymal hemorrhage or  extra-axial fluid collection. No mass lesion, mass effect, or midline shift.  No CT evidence of acute infarction.  Subcortical white matter and periventricular small vessel ischemic changes.  Intracranial atherosclerosis.  Global cortical atrophy.  No ventriculomegaly.  Partial opacification of the right sphenoid sinus.  Visualized paranasal sinuses and mastoid air cells are otherwise clear.  No evidence of calvarial fracture.  IMPRESSION: No evidence of acute intracranial abnormality.  Atrophy with small vessel ischemic changes and intracranial atherosclerosis.  CT CERVICAL SPINE  Findings: Straightening of the cervical spine.  No evidence of fracture or dislocation.  The vertebral body heights are maintained. Dens appears intact.  No prevertebral soft tissue swelling.  Moderate multilevel degenerative changes, most prominent at C4-5 and C5-6.  4 mm anterolisthesis of C3 on C4.  IMPRESSION: No evidence of traumatic injury to the cervical spine.  Moderate multilevel degenerative changes.  4 mm anterolisthesis of C3 on C4.   Original Report Authenticated By: Charline Bills, M.D.    Ct Cervical Spine Wo Contrast  11/19/2012  *RADIOLOGY REPORT*  Clinical Data:  Dizziness, fall, hit head  CT HEAD WITHOUT CONTRAST CT CERVICAL SPINE WITHOUT CONTRAST  Technique:  Multidetector CT imaging of the head and cervical spine was performed following the standard protocol without intravenous contrast.  Multiplanar CT image reconstructions of the cervical spine were also generated.  Comparison:  None.  CT HEAD  Findings: No evidence of parenchymal hemorrhage or extra-axial fluid collection. No mass lesion, mass effect, or midline shift.  No CT evidence of acute infarction.  Subcortical white matter and periventricular small vessel ischemic changes.  Intracranial atherosclerosis.  Global cortical atrophy.  No ventriculomegaly.  Partial opacification of the right sphenoid sinus.  Visualized paranasal sinuses and mastoid air cells  are otherwise clear.  No evidence of calvarial fracture.  IMPRESSION: No evidence of acute intracranial abnormality.  Atrophy with small vessel ischemic changes and intracranial atherosclerosis.  CT CERVICAL SPINE  Findings: Straightening of the cervical spine.  No evidence of fracture or dislocation.  The vertebral body heights are maintained. Dens appears intact.  No prevertebral soft tissue swelling.  Moderate multilevel degenerative changes, most prominent at C4-5 and C5-6.  4 mm anterolisthesis of C3 on C4.  IMPRESSION: No evidence of traumatic injury to the cervical spine.  Moderate multilevel degenerative changes.  4 mm anterolisthesis of C3 on C4.   Original Report Authenticated By: Charline Bills, M.D.       MDM  Iv ns. Monitor. Labs.   Reviewed nursing notes and prior charts for additional history.    Date: 11/19/2012  Rate: 123  Rhythm: atrial fibrillation  QRS Axis: normal  Intervals: afib, lbbb  ST/T Wave abnormalities: nonspecific ST/T changes  Conduction Disutrbances:left bundle branch block  Narrative Interpretation:   Old EKG Reviewed: changes noted  500 cc ns bolus.   ua pos, u cx added to labs. Rocephin iv.  Given volume depletion/dehydration, uti, syncope, [facility] hospitalist service called to admit.   Pt had requested med for anxiety/pain. Ativan 1 mg iv. Morphine 2 mg iv.  Pt calm, alert. Recheck vitals.   Recheck hr 106.  [facility] states team 1, tele.        Suzi Roots, MD 11/19/12 1424  Suzi Roots, MD 11/19/12 [phone]

## 2012-11-19 NOTE — ED Notes (Signed)
Patient transported to CT and xray 

## 2012-11-19 NOTE — H&P (Addendum)
[facility] History and Physical  Sophia Oliver:[id number] DOB: [birth year] DOA: 11/19/2012  Referring physician: ER physician PCP: [name], MD   Chief Complaint: fall, syncope  HPI:  79 year old female with past medical history or paroxysmal atrial tachycardia / atrial fibrillation, tachycardia/bradycardia syndrome status post pacemaker placement, CKD, HTN who presented to ED status post fall at home. Patient does not recall prodromal symptoms prior to the fall but believes she did not have chest pain, shortness of breath or palpitations. She reports feeling lightheaded on and off for past few weeks prior to this admission but cant remember exactly if she felt lightheaded prior to fall. She reports not losing consciousness completely. Patient reported no abdominal pain, no nausea or vomiting. No reports of blood in stool or urine. In ED, urinalysis is positive for infection. Her BMET revealed creatinine at baseline of 1.38. Her INR was elevated at 1.56 but patient is only on pradaxa and does not take other blood thinners. CT head and cervical pine showed no fractures.  Assessment and Plan:  Principal Problem:  *Near syncope and fall  Of obvious concern is lead failure considering history of pacemaker. Per ED, working fine  Cardiac  enzymes negative  Possible near syncope due to dehydration, vasovagal  Continue low rate IV fluids  Needs PT evaluation  Active Problems:  Atrial fibrillation  Continue pradaxa  Continue metoprolol 50 mg BID  Continue digoxin H/O tachy / brady syndrome and pacemaker, diastolic and systolic CHF  Continue digoxin and spironolactone  BNP on admission 910  CKD (chronic kidney disease), stage III  Creatinine on admission 1.38  Appears to be at patient's baseline  UTI (lower urinary tract infection)  Rocephin  Follow up urine culture results  Leukocytosis  Secondary to UTI  Continue rocephin and follow up urine  culture Coagulopathy  Follow up INR in am  Not on blood thinner so unclear etiology of this coagulopathy   Manson Passey [facility] [phone]  Review of Systems:   Constitutional: Negative for fever, chills and malaise/fatigue. Negative for diaphoresis.  HENT: Negative for hearing loss, ear pain, nosebleeds, congestion, sore throat, neck pain, tinnitus and ear discharge.   Eyes: Negative for blurred vision, double vision, photophobia, pain, discharge and redness.  Respiratory: Negative for cough, hemoptysis, sputum production, shortness of breath, wheezing and stridor.   Cardiovascular: Negative for chest pain, palpitations, orthopnea, claudication and leg swelling.  Gastrointestinal: Negative for nausea, vomiting and abdominal pain. Negative for heartburn, constipation, blood in stool and melena.  Genitourinary: Negative for dysuria, urgency, frequency, hematuria and flank pain.  Musculoskeletal: per HPI.  Skin: Negative for itching and rash.  Neurological: Negative for dizziness. Negative for tingling, tremors, sensory change, speech change, focal weakness, loss of consciousness and headaches.  Endo/Heme/Allergies: Negative for environmental allergies and polydipsia. Does not bruise/bleed easily.  Psychiatric/Behavioral: Negative for suicidal ideas. The patient is not nervous/anxious.      Past Medical History  Diagnosis Date  . Syncope   . Sinus node dysfunction   . S/P cardiac pacemaker procedure     Guidant  . Wide-complex tachycardia   . Cholelithiasis   . Glucose intolerance (impaired glucose tolerance)   . DJD (degenerative joint disease)   . Anxiety   . PONV (postoperative nausea and vomiting)   . Pacemaker   . Chronic kidney disease     frequency  . Breast cancer     left breast - hx of 2003   . Injury of right  upper arm     after venipuncture in 3/13 - pt describes pain afterwards- no followup done seen by PCP- no further treatment    Past Surgical History  Procedure  Date  . S/p pacemaker   . Replacement total knee bilateral   . Total shoulder replacement   . Total hip arthroplasty   . Femur fracture surgery   . Hysterectomy, unspecified area   . Masterectomy   . Abdominal hysterectomy   . Breast surgery     left breast - 2003   . Total hip arthroplasty 06/16/2012    Procedure: TOTAL HIP ARTHROPLASTY;  Surgeon: [name], MD;  Location: [facility];  Service: Orthopedics;  Laterality: Left;  . Hip closed reduction 06/30/2012    Procedure: CLOSED MANIPULATION HIP;  Surgeon: [name], MD;  Location: [facility];  Service: Orthopedics;  Laterality: Left;   Social History:  reports that she quit smoking about 64 years ago. Her smoking use included Cigarettes. She has never used smokeless tobacco. She reports that she does not drink alcohol or use illicit drugs.  Allergies  Allergen Reactions  . Codeine Nausea And Vomiting  . Other Nausea And Vomiting    Narcotics  dizziness    Family History: htn in parents  Prior to Admission medications   Medication Sig Start Date End Date Taking? Authorizing Provider  ALPRAZolam (XANAX) 0.25 MG tablet Take 0.25 mg by mouth 2 (two) times daily.    Yes Historical Provider, MD  cholecalciferol (VITAMIN D) 1000 UNITS tablet Take 1,000 Units by mouth daily.   Yes Historical Provider, MD  Cinnamon 500 MG capsule Take 500 mg by mouth every morning.   Yes Historical Provider, MD  dabigatran (PRADAXA) 75 MG CAPS Take 75 mg by mouth 2 (two) times daily.  05/29/11  Yes Duke Salvia, MD  digoxin (LANOXIN) 0.125 MG tablet Take 1 tablet (0.125 mg total) by mouth daily. 06/19/12 06/19/13 Yes Genelle Gather Babish, PA  fish oil-omega-3 fatty acids 1000 MG capsule Take 1 g by mouth daily.   Yes Historical Provider, MD  HYDROcodone-acetaminophen (NORCO/VICODIN) 5-325 MG per tablet Take 1 tablet by mouth every 6 (six) hours as needed. pain   Yes Historical Provider, MD  metoprolol (LOPRESSOR) 50 MG tablet Take 50 mg by mouth 2  (two) times daily.   Yes Historical Provider, MD  spironolactone (ALDACTONE) 25 MG tablet Take 25 mg by mouth every morning.   Yes Historical Provider, MD   Physical Exam: Filed Vitals:   11/19/12 1349 11/19/12 1534 11/19/12 1755 11/19/12 2100  BP: 123/63 119/78    Pulse:  119 117 120  Temp:   99 F (37.2 C) 98.3 F (36.8 C)  TempSrc:   Oral Oral  Resp: 22 15 18    Height:   5\' 4"  (1.626 m)   Weight:   47.4 kg (104 lb 8 oz)   SpO2:  100% 100%     Physical Exam  Constitutional: Appears in no acute distress.  HENT: Normocephalic. No tonsillar erythema or exudates Eyes: Conjunctivae and EOM are normal. PERRLA, no scleral icterus.  Neck: Normal ROM. Neck supple. No JVD. No tracheal deviation. No thyromegaly.  Oliver:[id number] rhythm, rate controlled; (+) S1/S2 Pulmonary: Effort and breath sounds normal, no stridor, rhonchi, wheezes, rales.  Abdominal: Soft. BS +,  no distension, tenderness, rebound or guarding.  Musculoskeletal: Normal range of motion. No edema and no tenderness.  Lymphadenopathy: No lymphadenopathy noted, cervical, inguinal. Neuro: Alert. Normal reflexes, muscle tone coordination.  No cranial nerve deficit. Skin: Skin is warm and dry. No rash noted. Not diaphoretic. No erythema. No pallor.  Psychiatric: Normal mood and affect. Behavior, judgment, thought content normal.   Labs on Admission:  Basic Metabolic Panel:  Lab 11/19/12 3086  NA 140  K 4.8  CL 106  CO2 17*  GLUCOSE 106*  BUN 38*  CREATININE 1.38*  CALCIUM 9.8  MG --  PHOS --   Liver Function Tests:  Lab 11/19/12 1235  AST 14  ALT 9  ALKPHOS 126*  BILITOT 0.5  PROT 6.4  ALBUMIN 3.6   No results found for this basename: LIPASE:5,AMYLASE:5 in the last 168 hours No results found for this basename: AMMONIA:5 in the last 168 hours CBC:  Lab 11/19/12 1235  WBC 15.8*  NEUTROABS --  HGB 12.9  HCT 38.5  MCV 93.4  PLT 276   Cardiac Enzymes:  Lab 11/19/12 1235  CKTOTAL --  CKMB --   CKMBINDEX --  TROPONINI <0.30   BNP: No components found with this basename: POCBNP:5 CBG: No results found for this basename: GLUCAP:5 in the last 168 hours  Radiological Exams on Admission: Dg Chest 2 View  11/19/2012  *RADIOLOGY REPORT*  Clinical Data: Fall, shortness of breath  CHEST - 2 VIEW  Comparison: 06/30/2012  Findings: Chronic interstitial markings/emphysematous changes.  No focal consolidation. No pleural effusion or pneumothorax.  Lucency along the right lateral chest wall reflects a skin fold.  The heart is normal in size.  Right subclavian pacemaker.  Chronic degenerative changes of the left shoulder/humeral head. Prior right shoulder arthroplasty.  Mild degenerative changes of the visualized thoracolumbar spine.  IMPRESSION: No evidence of acute cardiopulmonary disease.  Chronic interstitial markings/emphysematous changes.   Original Report Authenticated By: Charline Bills, M.D.    Dg Hip Complete Left  11/19/2012  *RADIOLOGY REPORT*  Clinical Data: Fall, left hip pain.  LEFT HIP - COMPLETE 2+ VIEW  Comparison: 06/30/2012  Findings: Changes of left hip replacement.  Diffuse osteopenia.  No hardware or acute bony abnormality.  No acute fracture, subluxation or dislocation.  Probable old healed left inferior pubic ramus fracture.  IMPRESSION: Left replacement.  No acute bony abnormality.   Original Report Authenticated By: Charlett Nose, M.D.    Ct Head Wo Contrast  11/19/2012  *RADIOLOGY REPORT*  Clinical Data:  Dizziness, fall, hit head  CT HEAD WITHOUT CONTRAST CT CERVICAL SPINE WITHOUT CONTRAST  Technique:  Multidetector CT imaging of the head and cervical spine was performed following the standard protocol without intravenous contrast.  Multiplanar CT image reconstructions of the cervical spine were also generated.  Comparison:  None.  CT HEAD  Findings: No evidence of parenchymal hemorrhage or extra-axial fluid collection. No mass lesion, mass effect, or midline shift.  No CT  evidence of acute infarction.  Subcortical white matter and periventricular small vessel ischemic changes.  Intracranial atherosclerosis.  Global cortical atrophy.  No ventriculomegaly.  Partial opacification of the right sphenoid sinus.  Visualized paranasal sinuses and mastoid air cells are otherwise clear.  No evidence of calvarial fracture.  IMPRESSION: No evidence of acute intracranial abnormality.  Atrophy with small vessel ischemic changes and intracranial atherosclerosis.  CT CERVICAL SPINE  Findings: Straightening of the cervical spine.  No evidence of fracture or dislocation.  The vertebral body heights are maintained. Dens appears intact.  No prevertebral soft tissue swelling.  Moderate multilevel degenerative changes, most prominent at C4-5 and C5-6.  4 mm anterolisthesis of C3 on C4.  IMPRESSION: No  evidence of traumatic injury to the cervical spine.  Moderate multilevel degenerative changes.  4 mm anterolisthesis of C3 on C4.   Original Report Authenticated By: Charline Bills, M.D.    Ct Cervical Spine Wo Contrast  11/19/2012  *RADIOLOGY REPORT*  Clinical Data:  Dizziness, fall, hit head  CT HEAD WITHOUT CONTRAST CT CERVICAL SPINE WITHOUT CONTRAST  Technique:  Multidetector CT imaging of the head and cervical spine was performed following the standard protocol without intravenous contrast.  Multiplanar CT image reconstructions of the cervical spine were also generated.  Comparison:  None.  CT HEAD  Findings: No evidence of parenchymal hemorrhage or extra-axial fluid collection. No mass lesion, mass effect, or midline shift.  No CT evidence of acute infarction.  Subcortical white matter and periventricular small vessel ischemic changes.  Intracranial atherosclerosis.  Global cortical atrophy.  No ventriculomegaly.  Partial opacification of the right sphenoid sinus.  Visualized paranasal sinuses and mastoid air cells are otherwise clear.  No evidence of calvarial fracture.  IMPRESSION: No evidence  of acute intracranial abnormality.  Atrophy with small vessel ischemic changes and intracranial atherosclerosis.  CT CERVICAL SPINE  Findings: Straightening of the cervical spine.  No evidence of fracture or dislocation.  The vertebral body heights are maintained. Dens appears intact.  No prevertebral soft tissue swelling.  Moderate multilevel degenerative changes, most prominent at C4-5 and C5-6.  4 mm anterolisthesis of C3 on C4.  IMPRESSION: No evidence of traumatic injury to the cervical spine.  Moderate multilevel degenerative changes.  4 mm anterolisthesis of C3 on C4.   Original Report Authenticated By: Charline Bills, M.D.     Code Status: Full Family Communication: Pt at bedside Disposition Plan: Admit for further evaluation  Manson Passey, MD  [facility] Pager [phone]  If 7PM-7AM, please contact night-coverage [email or web address] Password [email or web address] 11/19/2012, 10:20 PM

## 2012-11-20 DIAGNOSIS — R55 Syncope and collapse: Principal | ICD-10-CM

## 2012-11-20 DIAGNOSIS — N390 Urinary tract infection, site not specified: Secondary | ICD-10-CM

## 2012-11-20 LAB — CBC
HCT: 32.4 % — ABNORMAL LOW (ref 36.0–46.0)
Hemoglobin: 10.7 g/dL — ABNORMAL LOW (ref 12.0–15.0)
RBC: 3.44 MIL/uL — ABNORMAL LOW (ref 3.87–5.11)

## 2012-11-20 LAB — COMPREHENSIVE METABOLIC PANEL
ALT: 6 U/L (ref 0–35)
AST: 12 U/L (ref 0–37)
CO2: 21 mEq/L (ref 19–32)
Calcium: 8.5 mg/dL (ref 8.4–10.5)
Creatinine, Ser: 1.44 mg/dL — ABNORMAL HIGH (ref 0.50–1.10)
GFR calc Af Amer: 37 mL/min — ABNORMAL LOW (ref >90)
GFR calc non Af Amer: 32 mL/min — ABNORMAL LOW (ref >90)
Glucose, Bld: 131 mg/dL — ABNORMAL HIGH (ref 70–99)
Sodium: 140 mEq/L (ref 135–145)
Total Protein: 5.4 g/dL — ABNORMAL LOW (ref 6.0–8.3)

## 2012-11-20 LAB — GLUCOSE, CAPILLARY: Glucose-Capillary: 133 mg/dL — ABNORMAL HIGH (ref 70–99)

## 2012-11-20 MED ORDER — PROSIGHT PO TABS
1.0000 | ORAL_TABLET | Freq: Every day | ORAL | Status: DC
  Administered 2012-11-20 – 2012-11-25 (×6): 1 via ORAL
  Filled 2012-11-20 (×6): qty 1

## 2012-11-20 MED ORDER — ENSURE COMPLETE PO LIQD
237.0000 mL | Freq: Three times a day (TID) | ORAL | Status: DC
  Administered 2012-11-20 – 2012-11-25 (×12): 237 mL via ORAL

## 2012-11-20 NOTE — Progress Notes (Signed)
[facility] PROGRESS NOTE  Sophia Oliver ZOX:[id number] DOB: August 29, 1924 DOA: 11/19/2012 PCP: [name], MD  Assessment/Plan  *Near syncope and fall:  May have been side effect of oxybutynin which has a relatively [employer] [place] half life, although patient has had some bradycardia on telemetry.  Pacer does appear to fire when there has been a more prolonged delay in ventricular rate.   Interrogation of pacemaker pending Cardiac enzymes negative  Consider MRI brain to rule out CVA if not improving PT recommending SNF  Active Problems:  Atrial fibrillation  Continue pradaxa  Continue metoprolol 50 mg BID  Continue digoxin H/O tachy / brady syndrome and pacemaker, diastolic and systolic CHF  Continue digoxin and spironolactone and BB BNP on admission 910 CKD (chronic kidney disease), stage III  Creatinine on admission 1.38  Appears to be at patient's baseline UTI (lower urinary tract infection)  Rocephin  urine culture >100000 GNR Leukocytosis  Secondary to UTI  Coagulopathy:  Elevated INR is likely due to dabigatran.  No need to continue trending as inpatient.  Has been elevated in the past and likely demonstrates patient's compliance with therapy.  Diet:  Regular Access:  PIV IVF:  none Proph:  pradaxa  Code Status: Full code Family Communication:  Spoke with patient alone today Disposition Plan:  Likely to SNF on Monday   Consultants:  Cardiology for pacer interrogation  Procedures:  Pacer interrogation  Antibiotics:  Ceftriaxone 1/17 >>   HPI/Subjective:  The patient states that she has felt lightheaded and dizzy every since starting oxybutynin about a week ago.  She has since stopped the medication, but she has had persistent dizziness.  Denies N/V/D/C, dysuria.  Denies CP, SOB.    Objective: Filed Vitals:   11/19/12 2100 11/20/12 0650 11/20/12 1034 11/20/12 1041  BP:  120/75  137/67  Pulse: 120 57 75 93  Temp: 98.3 F (36.8 C) 98 F (36.7 C)      TempSrc: Oral Oral    Resp:  18    Height:      Weight:  48.1 kg (106 lb 0.7 oz)    SpO2:  100%      Intake/Output Summary (Last 24 hours) at 11/20/12 1503 Last data filed at 11/20/12 0900  Gross per 24 hour  Intake   1210 ml  Output      0 ml  Net   1210 ml   Filed Weights   11/19/12 1755 11/20/12 0650  Weight: 47.4 kg (104 lb 8 oz) 48.1 kg (106 lb 0.7 oz)    Exam:   General:  Caucasian female, no acute distress, sitting in chair, pleasant and conversant  HEENT:  PERRL, anicteric, noninjected  Cardiovascular:  Irregularly irregular, no murmurs, rubs, gallops, 2+ pulses, warm extremities  Respiratory:  CTAB  Abdomen:  NABS, soft, nondistended, nontender, no organomegaly  MSK:  Normal tone and bulk, no LEE  Neuro:  III-XII grossly intact  Data Reviewed: Basic Metabolic Panel:  Lab 11/20/12 8119 11/19/12 1235  NA 140 140  K 4.8 4.8  CL 110 106  CO2 21 17*  GLUCOSE 131* 106*  BUN 37* 38*  CREATININE 1.44* 1.38*  CALCIUM 8.5 9.8  MG -- --  PHOS -- --   Liver Function Tests:  Lab 11/20/12 0434 11/19/12 1235  AST 12 14  ALT 6 9  ALKPHOS 99 126*  BILITOT 0.3 0.5  PROT 5.4* 6.4  ALBUMIN 2.8* 3.6   No results found for this basename: LIPASE:5,AMYLASE:5 in the last  168 hours No results found for this basename: AMMONIA:5 in the last 168 hours CBC:  Lab 11/20/12 0434 11/19/12 1235  WBC 7.8 15.8*  NEUTROABS -- --  HGB 10.7* 12.9  HCT 32.4* 38.5  MCV 94.2 93.4  PLT 233 276   Cardiac Enzymes:  Lab 11/19/12 1235  CKTOTAL --  CKMB --  CKMBINDEX --  TROPONINI <0.30   BNP (last 3 results)  Basename 11/19/12 1235  PROBNP 910.2*   CBG:  Lab 11/20/12 0748  GLUCAP 133*    Recent Results (from the past 240 hour(s))  URINE CULTURE     Status: Normal (Preliminary result)   Collection Time   11/19/12 12:48 PM      Component Value Range Status Comment   Specimen Description URINE, CATHETERIZED   Final    Special Requests NONE   Final    Culture   Setup Time 11/19/2012 18:58   Final    Colony Count >=100,000 COLONIES/ML   Final    Culture GRAM NEGATIVE RODS   Final    Report Status PENDING   Incomplete      Studies: Dg Chest 2 View  11/19/2012  *RADIOLOGY REPORT*  Clinical Data: Fall, shortness of breath  CHEST - 2 VIEW  Comparison: 06/30/2012  Findings: Chronic interstitial markings/emphysematous changes.  No focal consolidation. No pleural effusion or pneumothorax.  Lucency along the right lateral chest wall reflects a skin fold.  The heart is normal in size.  Right subclavian pacemaker.  Chronic degenerative changes of the left shoulder/humeral head. Prior right shoulder arthroplasty.  Mild degenerative changes of the visualized thoracolumbar spine.  IMPRESSION: No evidence of acute cardiopulmonary disease.  Chronic interstitial markings/emphysematous changes.   Original Report Authenticated By: Charline Bills, M.D.    Dg Hip Complete Left  11/19/2012  *RADIOLOGY REPORT*  Clinical Data: Fall, left hip pain.  LEFT HIP - COMPLETE 2+ VIEW  Comparison: 06/30/2012  Findings: Changes of left hip replacement.  Diffuse osteopenia.  No hardware or acute bony abnormality.  No acute fracture, subluxation or dislocation.  Probable old healed left inferior pubic ramus fracture.  IMPRESSION: Left replacement.  No acute bony abnormality.   Original Report Authenticated By: Charlett Nose, M.D.    Ct Head Wo Contrast  11/19/2012  *RADIOLOGY REPORT*  Clinical Data:  Dizziness, fall, hit head  CT HEAD WITHOUT CONTRAST CT CERVICAL SPINE WITHOUT CONTRAST  Technique:  Multidetector CT imaging of the head and cervical spine was performed following the standard protocol without intravenous contrast.  Multiplanar CT image reconstructions of the cervical spine were also generated.  Comparison:  None.  CT HEAD  Findings: No evidence of parenchymal hemorrhage or extra-axial fluid collection. No mass lesion, mass effect, or midline shift.  No CT evidence of acute  infarction.  Subcortical white matter and periventricular small vessel ischemic changes.  Intracranial atherosclerosis.  Global cortical atrophy.  No ventriculomegaly.  Partial opacification of the right sphenoid sinus.  Visualized paranasal sinuses and mastoid air cells are otherwise clear.  No evidence of calvarial fracture.  IMPRESSION: No evidence of acute intracranial abnormality.  Atrophy with small vessel ischemic changes and intracranial atherosclerosis.  CT CERVICAL SPINE  Findings: Straightening of the cervical spine.  No evidence of fracture or dislocation.  The vertebral body heights are maintained. Dens appears intact.  No prevertebral soft tissue swelling.  Moderate multilevel degenerative changes, most prominent at C4-5 and C5-6.  4 mm anterolisthesis of C3 on C4.  IMPRESSION: No evidence of traumatic injury  to the cervical spine.  Moderate multilevel degenerative changes.  4 mm anterolisthesis of C3 on C4.   Original Report Authenticated By: Charline Bills, M.D.    Ct Cervical Spine Wo Contrast  11/19/2012  *RADIOLOGY REPORT*  Clinical Data:  Dizziness, fall, hit head  CT HEAD WITHOUT CONTRAST CT CERVICAL SPINE WITHOUT CONTRAST  Technique:  Multidetector CT imaging of the head and cervical spine was performed following the standard protocol without intravenous contrast.  Multiplanar CT image reconstructions of the cervical spine were also generated.  Comparison:  None.  CT HEAD  Findings: No evidence of parenchymal hemorrhage or extra-axial fluid collection. No mass lesion, mass effect, or midline shift.  No CT evidence of acute infarction.  Subcortical white matter and periventricular small vessel ischemic changes.  Intracranial atherosclerosis.  Global cortical atrophy.  No ventriculomegaly.  Partial opacification of the right sphenoid sinus.  Visualized paranasal sinuses and mastoid air cells are otherwise clear.  No evidence of calvarial fracture.  IMPRESSION: No evidence of acute  intracranial abnormality.  Atrophy with small vessel ischemic changes and intracranial atherosclerosis.  CT CERVICAL SPINE  Findings: Straightening of the cervical spine.  No evidence of fracture or dislocation.  The vertebral body heights are maintained. Dens appears intact.  No prevertebral soft tissue swelling.  Moderate multilevel degenerative changes, most prominent at C4-5 and C5-6.  4 mm anterolisthesis of C3 on C4.  IMPRESSION: No evidence of traumatic injury to the cervical spine.  Moderate multilevel degenerative changes.  4 mm anterolisthesis of C3 on C4.   Original Report Authenticated By: Charline Bills, M.D.     Scheduled Meds:   . ALPRAZolam  0.25 mg Oral BID  . cefTRIAXone (ROCEPHIN)  IV  1 g Intravenous Q24H  . cholecalciferol  1,000 Units Oral Daily  . dabigatran  75 mg Oral BID  . diclofenac sodium  2 g Topical QID  . digoxin  0.125 mg Oral Daily  . feeding supplement  237 mL Oral TID BM  . metoprolol  50 mg Oral BID  . multivitamin  1 tablet Oral Daily  . omega-3 acid ethyl esters  1 g Oral Daily  . sodium chloride  3 mL Intravenous Q12H  . spironolactone  25 mg Oral q morning - 10a   Continuous Infusions:   . sodium chloride Stopped (11/19/12 1445)    Principal Problem:  *Syncope and collapse Active Problems:  Atrial fibrillation  CKD (chronic kidney disease), stage III  UTI (lower urinary tract infection)  Leukocytosis  Pacemaker    Time spent: 30 min    Jathen Sudano  [facility] Pager [phone]. If 8PM-8AM, please contact night-coverage at [email or web address], password [email or web address] 11/20/2012, 3:03 PM  LOS: 1 day

## 2012-11-20 NOTE — Progress Notes (Signed)
Clinical Social Work Department BRIEF PSYCHOSOCIAL ASSESSMENT 11/20/2012  Patient:  Sophia Oliver, Sophia Oliver     Account Number:  [id number]     Admit date:  11/19/2012  Clinical Social Worker:  Orpah Greek  Date/Time:  11/20/2012 11:52 AM  Referred by:  Physician  Date Referred:  11/20/2012 Referred for  SNF Placement   Other Referral:   Interview type:  Patient Other interview type:    PSYCHOSOCIAL DATA Living Status:  HUSBAND Admitted from facility:   Level of care:   Primary support name:  Kaydynce Pat (husband) ph#: [phone] Primary support relationship to patient:  SPOUSE Degree of support available:   good    CURRENT CONCERNS Current Concerns  Post-Acute Placement   Other Concerns:    SOCIAL WORK ASSESSMENT / PLAN CSW spoke with patient re: discharge planning. PT recommended SNF at discharge. Patient informed CSW that she was at [facility] from 8/16 - 09/12/12 (86 days). Her insurance - Medicare & AARP will only cover 100 days, leaving her 2 weeks.   Assessment/plan status:  Information/Referral to [facility] Other assessment/ plan:   Information/referral to community resources:   CSW completed FL2 and faxed information out to [facility] incase patient will need SNF still at discharge.    PATIENT'S/FAMILY'S RESPONSE TO PLAN OF CARE: Patient states she would prefer to return home with her husband and home health at discharge rather than going to SNF, though she says if the doctor still feels that she needs to go back to rehab at discharge she would be ok with that.        Unice Bailey, LCSW [facility] Clinical Social Worker cell #: [phone]

## 2012-11-20 NOTE — Evaluation (Signed)
Physical Therapy Evaluation Patient Details Name: Sophia Oliver MRN: [id number] DOB: [birth year] Today's Date: 11/20/2012 Time: 7829-5621 PT Time Calculation (min): 20 min  PT Assessment / Plan / Recommendation Clinical Impression  Pt presents with syncope and collapse at home, hitting L hip which was replaced in Aug 2013 and then with closed manipulation from dislocation in late Aug 2013.  She is very sore in the L hip during PT eval.  Tolerated OOB and some steps with RW, however requries increased assist due to pain.  No c/o dizziness/ light headedness during eval.  BP 115/81 in sitting.  Pt will benefit from skilled PT in acute venue to address defictis.  PT recommends SNF at D/C to maximize pts safety.     PT Assessment  Patient needs continued PT services    Follow Up Recommendations  SNF;Supervision/Assistance - 24 hour    Does the patient have the potential to tolerate intense rehabilitation      Barriers to Discharge Decreased caregiver support      Equipment Recommendations  None recommended by PT    Recommendations for Other Services OT consult   Frequency Min 3X/week    Precautions / Restrictions Precautions Precautions: Posterior Hip Precaution Comments: Pt with THA in Aug then with closed manipulation also in Aug.  Continues to follow THP.  PTA pt fell on hip, however xray negative for injury.  Restrictions Weight Bearing Restrictions: No   Pertinent Vitals/Pain 7/10 pain in L hip, RN aware and ice pack applied      Mobility  Bed Mobility Bed Mobility: Supine to Sit;Sitting - Scoot to Edge of Bed Supine to Sit: 4: Min assist;HOB elevated;With rails Sitting - Scoot to Edge of Bed: 4: Min assist Details for Bed Mobility Assistance: Assist for LLE to EOB and some assist for trunk to attain sitting position.  cues for hand placement on bed to self assist and to maintain THP.  Transfers Transfers: Sit to Stand;Stand to Sit Sit to Stand: 3: Mod assist;With upper  extremity assist;From bed Stand to Sit: 3: Mod assist;With upper extremity assist;To chair/3-in-1 Details for Transfer Assistance: Assist to rise, steady and ensure controlled descent with max cues for hand placement, esp when sitting due to tendency to "flop" in chair.  Ambulation/Gait Ambulation/Gait Assistance: 3: Mod assist Ambulation Distance (Feet): 8 Feet Assistive device: Rolling walker Ambulation/Gait Assistance Details: Cues for sequencing/technique with RW and to maintain upright posture.  Pt unable to ambulate very far due to increased pain in hip.  No c/o dizziness or light headedness during eval.  BP 115/81 in sitting.  Gait Pattern: Step-to pattern;Decreased stance time - left;Decreased step length - right;Trunk flexed;Antalgic Gait velocity: very slow Stairs: No Wheelchair Mobility Wheelchair Mobility: No    Shoulder Instructions     Exercises     PT Diagnosis: Difficulty walking;Generalized weakness;Acute pain  PT Problem List: Decreased strength;Decreased range of motion;Decreased activity tolerance;Decreased balance;Decreased mobility;Decreased knowledge of use of DME;Decreased safety awareness;Decreased knowledge of precautions;Pain;Cardiopulmonary status limiting activity PT Treatment Interventions: DME instruction;Gait training;Functional mobility training;Therapeutic activities;Therapeutic exercise;Balance training;Patient/family education   PT Goals Acute Rehab PT Goals PT Goal Formulation: With patient/family Time For Goal Achievement: 12/04/12 Potential to Achieve Goals: Good Pt will go Supine/Side to Sit: with supervision PT Goal: Supine/Side to Sit - Progress: Goal set today Pt will go Sit to Supine/Side: with supervision PT Goal: Sit to Supine/Side - Progress: Goal set today Pt will go Sit to Stand: with supervision PT Goal: Sit to Stand - Progress:  Goal set today Pt will go Stand to Sit: with supervision PT Goal: Stand to Sit - Progress: Goal set  today Pt will Ambulate: 51 - 150 feet;with min assist;with least restrictive assistive device PT Goal: Ambulate - Progress: Goal set today Pt will Perform Home Exercise Program: with supervision, verbal cues required/provided PT Goal: Perform Home Exercise Program - Progress: Goal set today  Visit Information  Last PT Received On: 11/20/12 Assistance Needed: +1    Subjective Data  Subjective: My hip hurts so bad.  Patient Stated Goal: for my hip to feel better   Prior Functioning  Home Living Lives With: Spouse Available Help at Discharge: Available 24 hours/day Type of Home: House Home Access: Stairs to enter [employer] of Steps: 2 Entrance Stairs-Rails: None Home Layout: Able to live on main level with bedroom/bathroom Bathroom Shower/Tub: Walk-in shower;Tub/shower unit Bathroom Toilet: Handicapped height Home Adaptive Equipment: Bedside commode/3-in-1;Walker - rolling Prior Function Level of Independence: Independent with assistive device(s) Able to Take Stairs?: Yes Driving: No Vocation: Retired Musician: No difficulties    Cognition  Overall Cognitive Status: Appears within functional limits for tasks assessed/performed Arousal/Alertness: Awake/alert Orientation Level: Appears intact for tasks assessed Behavior During Session: [facility] for tasks performed    Extremity/Trunk Assessment Right Lower Extremity Assessment RLE ROM/Strength/Tone: WFL for tasks assessed RLE Sensation: WFL - Light Touch Left Lower Extremity Assessment LLE ROM/Strength/Tone: Deficits;Unable to fully assess;Due to pain LLE ROM/Strength/Tone Deficits: Pt grossly 2+ to 3/5 strength in LLE due to increased pain at time of eval.  LLE Sensation: [facility] - Light Touch Trunk Assessment Trunk Assessment: Kyphotic   Balance    End of Session PT - End of Session Equipment Utilized During Treatment: Gait belt Activity Tolerance: Patient limited by pain Patient left: in  chair;with call bell/phone within reach;with chair alarm set;with family/visitor present Nurse Communication: Mobility status;Patient requests pain meds  GP     Vista Deck 11/20/2012, 9:20 AM

## 2012-11-20 NOTE — Progress Notes (Signed)
Writer continues to get calls from the telemetry monitor tech to alert of patient's HR in 30's and 40's. Pt sleeping well. Writer noted that pt got Digoxin at 2000 last night and Lopressor 50mg  at 2130. At this time, Clinical research associate has awakened pt. HR rate in 50's and 40's. Pt alert and oriented; without c/o. No s/s of acute distress noted. Pt states that Dr. Sharyn Lull is her heart doctor and that she saw him last week and he said that "everything was fine". Pt requesting to go back to sleep. Writer will continue to monitor pt frequently.

## 2012-11-20 NOTE — Progress Notes (Signed)
INITIAL NUTRITION ASSESSMENT  DOCUMENTATION CODES Per approved criteria  -Non-severe (moderate) malnutrition in the context of social or environmental circumstances   INTERVENTION: 1.  Supplements; continue current supplement regimen. 2.  General healthful diet; encourage intake at meals.  Limit skipped meals.  NUTRITION DIAGNOSIS: Unintended wt loss related to poor appetite as evidenced by pt with .   Monitor:  1.  Food/Beverage; improvement in intake.  Pt consuming supplements. 2.  Wt/wt change; deter loss.    Reason for Assessment: MST  79 y.o. female  Admitting Dx: Syncope and collapse  ASSESSMENT: Pt admitted with fall after episode of syncope.  Pt reported feeling light-headed prior to fall.   RD received consult for assessment of nutrition status. Pt states her usual wt is ~125 lbs.  Wt on admission was 106 lbs.   Pt states she eats regularly, but small portions.  Dietary recall: Breakfast: dry cereal or oatmeal, OJ Lunch: sandwich (PB&J or ham with mayo), diet coke Dinner: meal prepared by husband, pt states small portion of meat. Snacks:  Ensure daily.  RD discussed recall with pt and inability to current intake to meet her estimated needs.  Pt states she knows she needs to eat more and acknowledges wt loss.  Pt states her appetite has been declining and she has only been eating as she gets hungry.  She is currently supplementing with Ensure once daily at home and is agreeable to consuming 2/day.  RD also discussed ways to increased kcal intake with food/meals currently consumed.  Pt states agreement and understanding.  Pt qualifies for non-severe malnutrition of social-environmental illness based on 20% wt loss over the past year with pt consuming </=75% of estimated needs.   Height: Ht Readings from Last 1 Encounters:  11/19/12 5\' 4"  (1.626 m)    Weight: Wt Readings from Last 1 Encounters:  11/20/12 106 lb 0.7 oz (48.1 kg)    Ideal Body Weight: 120 lbs  %  Ideal Body Weight: 88%  Wt Readings from Last 10 Encounters:  11/20/12 106 lb 0.7 oz (48.1 kg)  06/30/12 110 lb (49.896 kg)  06/30/12 110 lb (49.896 kg)  06/19/12 123 lb 7.3 oz (56 kg)  06/19/12 123 lb 7.3 oz (56 kg)  06/10/12 110 lb (49.896 kg)  05/24/11 128 lb (58.06 kg)  04/23/11 129 lb (58.514 kg)  01/08/11 137 lb (62.143 kg)  12/05/09 139 lb (63.05 kg)    Usual Body Weight: difficult to assess, trending loss over recent years  BMI:  Body mass index is 18.20 kg/(m^2).  Estimated Nutritional Needs: Kcal: 1350-1500 Protein: 50-65g Fluid: >1.2 L/day  Skin: wnl, intact  Diet Order: General  EDUCATION NEEDS: -Education needs addressed   Intake/Output Summary (Last 24 hours) at 11/20/12 1414 Last data filed at 11/20/12 0900  Gross per 24 hour  Intake   1210 ml  Output      0 ml  Net   1210 ml    Last BM: 1/17  Labs:   Lab 11/20/12 0434 11/19/12 1235  NA 140 140  K 4.8 4.8  CL 110 106  CO2 21 17*  BUN 37* 38*  CREATININE 1.44* 1.38*  CALCIUM 8.5 9.8  MG -- --  PHOS -- --  GLUCOSE 131* 106*    CBG (last 3)   Basename 11/20/12 0748  GLUCAP 133*    Scheduled Meds:   . ALPRAZolam  0.25 mg Oral BID  . cefTRIAXone (ROCEPHIN)  IV  1 g Intravenous Q24H  . cholecalciferol  1,000 Units Oral Daily  . dabigatran  75 mg Oral BID  . diclofenac sodium  2 g Topical QID  . digoxin  0.125 mg Oral Daily  . feeding supplement  237 mL Oral TID BM  . metoprolol  50 mg Oral BID  . multivitamin  1 tablet Oral Daily  . omega-3 acid ethyl esters  1 g Oral Daily  . sodium chloride  3 mL Intravenous Q12H  . spironolactone  25 mg Oral q morning - 10a    Continuous Infusions:   . sodium chloride Stopped (11/19/12 1445)    Past Medical History  Diagnosis Date  . Syncope   . Sinus node dysfunction   . S/P cardiac pacemaker procedure     Guidant  . Wide-complex tachycardia   . Cholelithiasis   . Glucose intolerance (impaired glucose tolerance)   . DJD  (degenerative joint disease)   . Anxiety   . PONV (postoperative nausea and vomiting)   . Pacemaker   . Chronic kidney disease     frequency  . Breast cancer     left breast - hx of 2003   . Injury of right upper arm     after venipuncture in 3/13 - pt describes pain afterwards- no followup done seen by PCP- no further treatment     Past Surgical History  Procedure Date  . S/p pacemaker   . Replacement total knee bilateral   . Total shoulder replacement   . Total hip arthroplasty   . Femur fracture surgery   . Hysterectomy, unspecified area   . Masterectomy   . Abdominal hysterectomy   . Breast surgery     left breast - 2003   . Total hip arthroplasty 06/16/2012    Procedure: TOTAL HIP ARTHROPLASTY;  Surgeon: [name], MD;  Location: [facility];  Service: Orthopedics;  Laterality: Left;  . Hip closed reduction 06/30/2012    Procedure: CLOSED MANIPULATION HIP;  Surgeon: [name], MD;  Location: [facility];  Service: Orthopedics;  Laterality: Left;    Loyce Dys, MS RD LDN Clinical Inpatient Dietitian Pager: [phone] Weekend/After hours pager: [phone]

## 2012-11-20 NOTE — Progress Notes (Signed)
Patient states she would prefer to return home with her husband and home health at discharge rather than going to SNF, though she says if the doctor still feels that she needs to go back to rehab at discharge she would be ok with that.    Clinical Social Work Department CLINICAL SOCIAL WORK PLACEMENT NOTE 11/20/2012  Patient:  Sophia Oliver, Sophia Oliver  Account Number:  [id number] Admit date:  11/19/2012  Clinical Social Worker:  Orpah Greek  Date/time:  11/20/2012 12:09 PM  Clinical Social Work is seeking post-discharge placement for this patient at the following level of care:   SKILLED NURSING   (*CSW will update this form in Epic as items are completed)   11/20/2012  Patient/family provided with [facility] Department of Clinical Social Work's list of facilities offering this level of care within the geographic area requested by the patient (or if unable, by the patient's family).  11/20/2012  Patient/family informed of their freedom to choose among providers that offer the needed level of care, that participate in Medicare, Medicaid or managed care program needed by the patient, have an available bed and are willing to accept the patient.  11/20/2012  Patient/family informed of [facility]' ownership interest in [facility], as well as of the fact that they are under no obligation to receive care at this facility.  PASARR submitted to EDS on 11/20/2012 PASARR number received from EDS on 11/20/2012  FL2 transmitted to all facilities in geographic area requested by pt/family on  11/20/2012 FL2 transmitted to all facilities within larger geographic area on   Patient informed that his/her managed care company has contracts with or will negotiate with  certain facilities, including the following:     Patient/family informed of bed offers received:  11/20/2012 Patient chooses bed at [facility] Physician recommends and patient chooses bed at    Patient to  be transferred to  on   Patient to be transferred to facility by   The following physician request were entered in Epic:   Additional Comments:  Unice Bailey, LCSW [facility] Clinical Social Worker cell #: [phone]

## 2012-11-21 LAB — BASIC METABOLIC PANEL
BUN: 35 mg/dL — ABNORMAL HIGH (ref 6–23)
CO2: 21 mEq/L (ref 19–32)
Chloride: 105 mEq/L (ref 96–112)
Creatinine, Ser: 1.48 mg/dL — ABNORMAL HIGH (ref 0.50–1.10)
Glucose, Bld: 116 mg/dL — ABNORMAL HIGH (ref 70–99)
Potassium: 5.1 mEq/L (ref 3.5–5.1)

## 2012-11-21 LAB — CBC
HCT: 30.4 % — ABNORMAL LOW (ref 36.0–46.0)
Hemoglobin: 10.1 g/dL — ABNORMAL LOW (ref 12.0–15.0)
MCV: 94.7 fL (ref 78.0–100.0)
WBC: 8.8 10*3/uL (ref 4.0–10.5)

## 2012-11-21 LAB — URINE CULTURE

## 2012-11-21 NOTE — Progress Notes (Signed)
[facility] PROGRESS NOTE  GARRY BOCHICCHIO ZOX:[id number] DOB: 1924-04-30 DOA: 11/19/2012 PCP: [name], MD  Assessment/Plan  *Near syncope and fall:  May have been side effect of oxybutynin which has a relatively Irene Mitcham half life.  No problems with pacer per cardiology.  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 Secondary to UTI  Left hip pain:  Severe and making it difficult to bear weight -  CT left hip  Diet:  Regular Access:  PIV IVF:  none Proph:  pradaxa  Code Status: Full code Family Communication:  Spoke with patient alone today Disposition Plan:  Likely to SNF on Monday   Consultants:  Cardiology for pacer interrogation  Procedures:  Pacer interrogation  Antibiotics:  Ceftriaxone 1/17 >>   HPI/Subjective:  The patient states that she still occasionally feels lightheaded and dizzy but improved from yesterday.  Denies N/V/D/C, dysuria.  Denies CP, SOB.  Still has severe pain in the LLE that makes it difficult to bear weight  Objective: Filed Vitals:   11/20/12 2214 11/21/12 0553 11/21/12 1100 11/21/12 1526  BP: 125/55 133/42 139/44 119/41  Pulse: 62 61 60 62  Temp:  97.9 F (36.6 C)  97.8 F (36.6 C)  TempSrc:  Oral  Oral  Resp:  18 20 16   Height:      Weight:  48.3 kg (106 lb 7.7 oz)    SpO2:  97% 100% 99%    Intake/Output Summary (Last 24 hours) at 11/21/12 1915 Last data filed  at 11/21/12 1600  Gross per 24 hour  Intake    697 ml  Output      0 ml  Net    697 ml   Filed Weights   11/19/12 1755 11/20/12 0650 11/21/12 0553  Weight: 47.4 kg (104 lb 8 oz) 48.1 kg (106 lb 0.7 oz) 48.3 kg (106 lb 7.7 oz)    Exam:   General:  Caucasian female, no acute distress, sitting in bed, pleasant and conversant  HEENT:  PERRL, anicteric, noninjected  Cardiovascular:  Irregularly irregular, no murmurs, rubs, gallops, 2+ pulses, warm extremities.  Tele demonstrates a highly variable rate from the 30s up to the 110s  Respiratory:  CTAB  Abdomen:  NABS, soft, nondistended, nontender, no organomegaly  MSK:  Normal tone and bulk, no LEE.  Severe TTP along focal spots of the left greater trochanter and several cm inferior.  NO swelling, bruising, erythema  Neuro:  III-XII grossly intact  Data Reviewed: Basic Metabolic Panel:  Lab 11/21/12 8119 11/20/12 0434 11/19/12 1235  NA 137 140 140  K 5.1 4.8 4.8  CL 105 110 106  CO2 21 21 17*  GLUCOSE 116* 131* 106*  BUN 35* 37* 38*  CREATININE 1.48* 1.44* 1.38*  CALCIUM 8.7 8.5 9.8  MG -- -- --  PHOS -- -- --   Liver Function Tests:  Lab 11/20/12 0434 11/19/12 1235  AST 12 14  ALT 6 9  ALKPHOS 99 126*  BILITOT 0.3 0.5  PROT 5.4* 6.4  ALBUMIN 2.8* 3.6   No results found for this basename: LIPASE:5,AMYLASE:5 in the last 168 hours No results found for this basename: AMMONIA:5 in the last 168 hours CBC:  Lab 11/21/12 0530 11/20/12 0434 11/19/12 1235  WBC 8.8 7.8 15.8*  NEUTROABS -- -- --  HGB 10.1* 10.7* 12.9  HCT 30.4* 32.4* 38.5  MCV 94.7 94.2 93.4  PLT 216 233 276   Cardiac Enzymes:  Lab 11/19/12 1235  CKTOTAL --  CKMB --  CKMBINDEX --  TROPONINI <0.30   BNP (last 3 results)  Basename 11/19/12 1235  PROBNP 910.2*   CBG:  Lab 11/21/12 0734 11/20/12 0748  GLUCAP 135* 133*    Recent Results (from the past 240 hour(s))  URINE CULTURE     Status: Normal   Collection Time   11/19/12 12:48 PM       Component Value Range Status Comment   Specimen Description URINE, CATHETERIZED   Final    Special Requests NONE   Final    Culture  Setup Time 11/19/2012 18:58   Final    Colony Count >=100,000 COLONIES/ML   Final    Culture KLEBSIELLA PNEUMONIAE   Final    Report Status 11/21/2012 FINAL   Final    Organism ID, Bacteria KLEBSIELLA PNEUMONIAE   Final      Studies: No results found.  Scheduled Meds:    . ALPRAZolam  0.25 mg Oral BID  . cefTRIAXone (ROCEPHIN)  IV  1 g Intravenous Q24H  . cholecalciferol  1,000 Units Oral Daily  . dabigatran  75 mg Oral BID  . diclofenac sodium  2 g Topical QID  . digoxin  0.125 mg Oral Daily  . feeding supplement  237 mL Oral TID BM  . metoprolol  50 mg Oral BID  . multivitamin  1 tablet Oral Daily  . omega-3 acid ethyl esters  1 g Oral Daily  . sodium chloride  3 mL Intravenous Q12H  . spironolactone  25 mg Oral q morning - 10a   Continuous Infusions:    . sodium chloride Stopped (11/19/12 1445)    Principal Problem:  *Syncope and collapse Active Problems:  Atrial fibrillation  CKD (chronic kidney disease), stage III  UTI (lower urinary tract infection)  Leukocytosis  Pacemaker    Time spent: 30 min    Rosy Estabrook  [facility] Pager [phone]. If 8PM-8AM, please contact night-coverage at [email or web address], password [facility] 11/21/2012, 7:15 PM  LOS: 2 days

## 2012-11-21 NOTE — Progress Notes (Signed)
Writer did not give Digoxin nor Lopressor this am due to pt's HR dropping in 40's to upper 60's. BP 139/44.  HR "all over the place" 30's to 112".  Will continue to monitor.

## 2012-11-21 NOTE — Progress Notes (Signed)
Pt had a BM on the bedpan and stated she was "dizzy" just turning side-to-side in bed.  VS WNL. Will continue to monitor. BM normal.

## 2012-11-22 ENCOUNTER — Inpatient Hospital Stay (HOSPITAL_COMMUNITY): Payer: Medicare Other

## 2012-11-22 LAB — BASIC METABOLIC PANEL
BUN: 34 mg/dL — ABNORMAL HIGH (ref 6–23)
CO2: 21 mEq/L (ref 19–32)
Calcium: 8.8 mg/dL (ref 8.4–10.5)
Glucose, Bld: 102 mg/dL — ABNORMAL HIGH (ref 70–99)
Sodium: 137 mEq/L (ref 135–145)

## 2012-11-22 LAB — CBC
HCT: 30.7 % — ABNORMAL LOW (ref 36.0–46.0)
Hemoglobin: 10.2 g/dL — ABNORMAL LOW (ref 12.0–15.0)
MCH: 31.5 pg (ref 26.0–34.0)
MCV: 94.8 fL (ref 78.0–100.0)
RBC: 3.24 MIL/uL — ABNORMAL LOW (ref 3.87–5.11)

## 2012-11-22 LAB — GLUCOSE, CAPILLARY: Glucose-Capillary: 116 mg/dL — ABNORMAL HIGH (ref 70–99)

## 2012-11-22 MED ORDER — FUROSEMIDE 20 MG PO TABS
10.0000 mg | ORAL_TABLET | Freq: Every day | ORAL | Status: DC
  Administered 2012-11-22 – 2012-11-25 (×4): 10 mg via ORAL
  Filled 2012-11-22 (×5): qty 0.5

## 2012-11-22 NOTE — Progress Notes (Signed)
Pt returned at this time from CT of left hip.

## 2012-11-22 NOTE — Progress Notes (Addendum)
[facility] PROGRESS NOTE  Sophia Oliver:[id number] DOB: 1924/02/03 DOA: 11/19/2012 PCP: [name], MD  Assessment/Plan  *Near syncope and fall:  May have been side effect of oxybutynin which has a relatively [place] half life.  No problems with pacer per cardiology.  Lightheadedness improved today Cardiac enzymes negative  Consider MRI brain to rule out CVA if not improving PT recommending SNF MRI brain Repeat orthostatics Dig level subtherapeutic  Active Problems:  Atrial fibrillation  Continue pradaxa  Continue metoprolol 50 mg BID  Continue digoxin H/O tachy / brady syndrome and pacemaker, diastolic and systolic CHF  Continue digoxin and BB D/c spironolactone for recurrent hyperkalemia Start low dose lasix instead BNP on admission 910 CKD (chronic kidney disease), stage III  Creatinine on admission 1.38  Appears to be at patient's baseline UTI (lower urinary tract infection)  Klebsiella R to amp and I to nitro, otherwise sensitive Rocephin  Leukocytosis resolved. Secondary to UTI  Left hip pain:  Severe and making it difficult to bear weight -  CT left hip:  Positive for acute on chronic inferior pubic ramus fracture and small cortical fracture of the medial aspect of the upper femur  Diet:  Regular Access:  PIV IVF:  none Proph:  pradaxa  Code Status: Full code Family Communication:  Spoke with patient alone today Disposition Plan:  Likely to SNF on Monday   Consultants:  Cardiology for pacer interrogation  Procedures:  Pacer interrogation  Antibiotics:  Ceftriaxone 1/17 >>   HPI/Subjective:  The patient states that she still has lightheadedness.  Denies room spinning sensation.  Can happen when standing up or rolling over in bed.  Denies N/V/D/C, dysuria.  Denies CP, SOB.  Still has severe pain in the LLE that makes it difficult to bear weight  Objective: Filed Vitals:   11/21/12 1526 11/21/12 2138 11/22/12 0500 11/22/12 0644  BP:  119/41 109/45  136/70  Pulse: 62 89  53  Temp: 97.8 F (36.6 C) 98.1 F (36.7 C)  97.4 F (36.3 C)  TempSrc: Oral Oral  Oral  Resp: 16 16  16   Height:      Weight:   50.1 kg (110 lb 7.2 oz) 50.1 kg (110 lb 7.2 oz)  SpO2: 99% 100%  100%    Intake/Output Summary (Last 24 hours) at 11/22/12 1037 Last data filed at 11/22/12 0856  Gross per 24 hour  Intake    397 ml  Output      0 ml  Net    397 ml   Filed Weights   11/21/12 0553 11/22/12 0500 11/22/12 0644  Weight: 48.3 kg (106 lb 7.7 oz) 50.1 kg (110 lb 7.2 oz) 50.1 kg (110 lb 7.2 oz)    Exam:   General:  Caucasian female, no acute distress, lying in bed, pleasant and conversant  HEENT:  PERRL, anicteric, noninjected  Cardiovascular:  Irregularly irregular, no murmurs, rubs, gallops, 2+ pulses, warm extremities.    Respiratory:  CTAB  Abdomen:  NABS, soft, nondistended, nontender, no organomegaly  MSK:  Normal tone and bulk, no LEE.  Severe TTP along focal spots of the left greater trochanter and several cm inferior.  NO swelling, bruising, erythema  Neuro:  III-XII grossly intact  Data Reviewed: Basic Metabolic Panel:  Lab 11/22/12 8119 11/21/12 0530 11/20/12 0434 11/19/12 1235  NA 137 137 140 140  K 5.3* 5.1 4.8 4.8  CL 105 105 110 106  CO2 21 21 21  17*  GLUCOSE 102* 116* 131*  106*  BUN 34* 35* 37* 38*  CREATININE 1.38* 1.48* 1.44* 1.38*  CALCIUM 8.8 8.7 8.5 9.8  MG -- -- -- --  PHOS -- -- -- --   Liver Function Tests:  Lab 11/20/12 0434 11/19/12 1235  AST 12 14  ALT 6 9  ALKPHOS 99 126*  BILITOT 0.3 0.5  PROT 5.4* 6.4  ALBUMIN 2.8* 3.6   No results found for this basename: LIPASE:5,AMYLASE:5 in the last 168 hours No results found for this basename: AMMONIA:5 in the last 168 hours CBC:  Lab 11/22/12 0504 11/21/12 0530 11/20/12 0434 11/19/12 1235  WBC 7.7 8.8 7.8 15.8*  NEUTROABS -- -- -- --  HGB 10.2* 10.1* 10.7* 12.9  HCT 30.7* 30.4* 32.4* 38.5  MCV 94.8 94.7 94.2 93.4  PLT 226 216 233 276     Cardiac Enzymes:  Lab 11/19/12 1235  CKTOTAL --  CKMB --  CKMBINDEX --  TROPONINI <0.30   BNP (last 3 results)  Basename 11/19/12 1235  PROBNP 910.2*   CBG:  Lab 11/22/12 0737 11/21/12 0734 11/20/12 0748  GLUCAP 89 135* 133*    Recent Results (from the past 240 hour(s))  URINE CULTURE     Status: Normal   Collection Time   11/19/12 12:48 PM      Component Value Range Status Comment   Specimen Description URINE, CATHETERIZED   Final    Special Requests NONE   Final    Culture  Setup Time 11/19/2012 18:58   Final    Colony Count >=100,000 COLONIES/ML   Final    Culture KLEBSIELLA PNEUMONIAE   Final    Report Status 11/21/2012 FINAL   Final    Organism ID, Bacteria KLEBSIELLA PNEUMONIAE   Final      Studies: No results found.  Scheduled Meds:    . ALPRAZolam  0.25 mg Oral BID  . cefTRIAXone (ROCEPHIN)  IV  1 g Intravenous Q24H  . cholecalciferol  1,000 Units Oral Daily  . dabigatran  75 mg Oral BID  . diclofenac sodium  2 g Topical QID  . digoxin  0.125 mg Oral Daily  . feeding supplement  237 mL Oral TID BM  . metoprolol  50 mg Oral BID  . multivitamin  1 tablet Oral Daily  . omega-3 acid ethyl esters  1 g Oral Daily  . sodium chloride  3 mL Intravenous Q12H   Continuous Infusions:    . sodium chloride Stopped (11/19/12 1445)    Principal Problem:  *Syncope and collapse Active Problems:  Atrial fibrillation  CKD (chronic kidney disease), stage III  UTI (lower urinary tract infection)  Leukocytosis  Pacemaker    Time spent: 30 min    Cardell Rachel  [facility] Pager [phone]. If 8PM-8AM, please contact night-coverage at [email or web address], password [facility] 11/22/2012, 10:37 AM  LOS: 3 days

## 2012-11-23 DIAGNOSIS — E875 Hyperkalemia: Secondary | ICD-10-CM

## 2012-11-23 DIAGNOSIS — R55 Syncope and collapse: Secondary | ICD-10-CM

## 2012-11-23 DIAGNOSIS — R42 Dizziness and giddiness: Secondary | ICD-10-CM

## 2012-11-23 LAB — BASIC METABOLIC PANEL
BUN: 38 mg/dL — ABNORMAL HIGH (ref 6–23)
Chloride: 102 mEq/L (ref 96–112)
GFR calc non Af Amer: 34 mL/min — ABNORMAL LOW (ref >90)
Glucose, Bld: 114 mg/dL — ABNORMAL HIGH (ref 70–99)
Potassium: 5.6 mEq/L — ABNORMAL HIGH (ref 3.5–5.1)

## 2012-11-23 LAB — CBC
HCT: 32.2 % — ABNORMAL LOW (ref 36.0–46.0)
Hemoglobin: 10.4 g/dL — ABNORMAL LOW (ref 12.0–15.0)
MCHC: 32.3 g/dL (ref 30.0–36.0)
MCV: 96.1 fL (ref 78.0–100.0)

## 2012-11-23 MED ORDER — SODIUM CHLORIDE 0.45 % IV SOLN
INTRAVENOUS | Status: DC
  Administered 2012-11-23: 75 mL/h via INTRAVENOUS

## 2012-11-23 NOTE — Progress Notes (Signed)
VASCULAR LAB PRELIMINARY  PRELIMINARY  PRELIMINARY  PRELIMINARY  Carotid duplex completed.    Preliminary report:  Bilateral:  No evidence of hemodynamically significant internal carotid artery stenosis.   Vertebral artery flow is antegrade.     Danyeal Akens, RVS 11/23/2012, 8:28 AM

## 2012-11-23 NOTE — Consult Note (Signed)
Reason for Consult: Left peri-prosthetic proximal femur fracture Referring Physician: [name], Sophia Oliver is an 79 y.o. female.  HPI: 79yo female with history of left total hip replacement fell at house as result of syncopal episode.  Complained of left hip pain.  Xrays initially read as being ok followed by CT scan that revealed fracture.  She otherwise has no other complaints that are new or acute.  Sophia Oliver's orthopaedic history is well known to me.   Past Medical History  Diagnosis Date  . Syncope   . Sinus node dysfunction   . S/P cardiac pacemaker procedure     Guidant  . Wide-complex tachycardia   . Cholelithiasis   . Glucose intolerance (impaired glucose tolerance)   . DJD (degenerative joint disease)   . Anxiety   . PONV (postoperative nausea and vomiting)   . Pacemaker   . Chronic kidney disease     frequency  . Breast cancer     left breast - hx of 2003   . Injury of right upper arm     after venipuncture in 3/13 - pt describes pain afterwards- no followup done seen by PCP- no further treatment     Past Surgical History  Procedure Date  . S/p pacemaker   . Replacement total knee bilateral   . Total shoulder replacement   . Total hip arthroplasty   . Femur fracture surgery   . Hysterectomy, unspecified area   . Masterectomy   . Abdominal hysterectomy   . Breast surgery     left breast - 2003   . Total hip arthroplasty 06/16/2012    Procedure: TOTAL HIP ARTHROPLASTY;  Surgeon: [name], MD;  Location: [facility];  Service: Orthopedics;  Laterality: Left;  . Hip closed reduction 06/30/2012    Procedure: CLOSED MANIPULATION HIP;  Surgeon: [name], MD;  Location: [facility];  Service: Orthopedics;  Laterality: Left;    History reviewed. No pertinent family history.  Social History:  reports that she quit smoking about 64 years ago. Her smoking use included Cigarettes. She has never used smokeless tobacco. She reports that she does not drink alcohol or use  illicit drugs.  Allergies:  Allergies  Allergen Reactions  . Codeine Nausea And Vomiting  . Other Nausea And Vomiting    Narcotics  dizziness    Medications:  I have reviewed the patient's current medications. Continuous:   . sodium chloride 75 mL/hr (11/23/12 0902)  . sodium chloride Stopped (11/19/12 1445)    Results for orders placed during the hospital encounter of 11/19/12 (from the past 24 hour(s))  GLUCOSE, CAPILLARY     Status: Abnormal   Collection Time   11/22/12 11:18 AM      Component Value Range   Glucose-Capillary 116 (*) 70 - 99 mg/dL  CBC     Status: Abnormal   Collection Time   11/23/12  4:15 AM      Component Value Range   WBC 8.7  4.0 - 10.5 K/uL   RBC 3.35 (*) 3.87 - 5.11 MIL/uL   Hemoglobin 10.4 (*) 12.0 - 15.0 g/dL   HCT 40.9 (*) 81.1 - 91.4 %   MCV 96.1  78.0 - 100.0 fL   MCH 31.0  26.0 - 34.0 pg   MCHC 32.3  30.0 - 36.0 g/dL   RDW 78.2  95.6 - 21.3 %   Platelets 248  150 - 400 K/uL  BASIC METABOLIC PANEL     Status:  Abnormal   Collection Time   11/23/12  4:15 AM      Component Value Range   Sodium 138  135 - 145 mEq/L   Potassium 5.6 (*) 3.5 - 5.1 mEq/L   Chloride 102  96 - 112 mEq/L   CO2 27  19 - 32 mEq/L   Glucose, Bld 114 (*) 70 - 99 mg/dL   BUN 38 (*) 6 - 23 mg/dL   Creatinine, Ser 4.09 (*) 0.50 - 1.10 mg/dL   Calcium 9.1  8.4 - 81.1 mg/dL   GFR calc non Af Amer 34 (*) >90 mL/min   GFR calc Af Amer 39 (*) >90 mL/min  GLUCOSE, CAPILLARY     Status: Normal   Collection Time   11/23/12  8:16 AM      Component Value Range   Glucose-Capillary 98  70 - 99 mg/dL     X-ray: CT scan of left hip - nondisplaced fracture involving proximal left femur with no evidence of femoral component subsidence, ie components stable around fracture  ROS: per medical admission reviewed Pertinent for recent syncopal report and fracture Orthopaedically significant for arthritis  Blood pressure 145/82, pulse 89, temperature 98.2 F (36.8 C),  temperature source Oral, resp. rate 18, height 5\' 4"  (1.626 m), weight 50.1 kg (110 lb 7.2 oz), SpO2 100.00%.  EXAM: Awake alert Pain with movement of left lower extremity NVI left and right lower extremities  Shoulders with horrible OA, painful movement unchanged from office visitis  Assessment/Plan:  Non displaced left proximal femur periprosthetic fracture  Treat no-operatively TDWB LLE until follow up  RTC, to see [name] in 2-4 weeks Call with any questions, [phone] or [phone] [name] 11/23/2012, 11:02 AM

## 2012-11-23 NOTE — Progress Notes (Signed)
  Echocardiogram 2D Echocardiogram has been performed.  Sophia Oliver, [facility] 11/23/2012, 11:58 AM

## 2012-11-23 NOTE — Progress Notes (Signed)
[facility] PROGRESS NOTE  Sophia Oliver:[id number] DOB: 02-Nov-1924 DOA: 11/19/2012 PCP: [name], MD  Assessment/Plan  *Near syncope and fall:  May have been side effect of oxybutynin which has a relatively [place] half life.  No problems with pacer per cardiology.  Lightheadedness still not resolved a week after stopping medication Cardiac enzymes negative  Not able to complete MRI brain as pacer not compatable PT recommending SNF Repeat orthostatics neg Dig level subtherapeutic ECHO demonstrates stable LV function Carotid duplex negative Vestibular PT/OT assessment  Active Problems:  Atrial fibrillation  Continue pradaxa  Continue metoprolol 50 mg BID  Continue digoxin H/O tachy / brady syndrome and pacemaker, diastolic and systolic CHF  Continue digoxin and BB Not on spironolactone or ACEI/ARB due to hyperkalemia Continue low dose lasix instead BNP on admission 910 CKD (chronic kidney disease), stage III  Creatinine on admission 1.38  Appears to be at patient's baseline UTI (lower urinary tract infection)  Klebsiella R to amp and I to nitro, otherwise sensitive Rocephin  Leukocytosis resolved. Secondary to UTI  Left hip pain:  Severe and making it difficult to bear weight -  CT left hip:  Positive for acute on chronic inferior pubic ramus fracture and small cortical fracture of the medial aspect of the upper femur -  Appreciate orthopedics assistance  Diet:  Regular Access:  PIV IVF:  none Proph:  pradaxa  Code Status: Full code Family Communication:  Spoke with patient alone today Disposition Plan:  Likely to SNF tomorrow   Consultants:  Cardiology for pacer interrogation  Procedures:  Pacer interrogation  Antibiotics:  Ceftriaxone 1/17 >> 1/20  HPI/Subjective:  The patient states that she still has lightheadedness.  Denies room spinning sensation.  Denies N/V/D/C, dysuria.  Denies CP, SOB.  Still has severe pain in the LLE that makes it  difficult to bear weight  Objective: Filed Vitals:   11/23/12 0625 11/23/12 0918 11/23/12 1458 11/23/12 2129  BP: 130/50 145/82 126/54 112/62  Pulse: 70 89 86 84  Temp: 98.2 F (36.8 C)  98 F (36.7 C) 97.8 F (36.6 C)  TempSrc: Oral  Oral Oral  Resp: 18  18 20   Height:      Weight:      SpO2: 100%  100% 99%    Intake/Output Summary (Last 24 hours) at 11/23/12 2219 Last data filed at 11/23/12 2158  Gross per 24 hour  Intake    600 ml  Output      0 ml  Net    600 ml   Filed Weights   11/21/12 0553 11/22/12 0500 11/22/12 0644  Weight: 48.3 kg (106 lb 7.7 oz) 50.1 kg (110 lb 7.2 oz) 50.1 kg (110 lb 7.2 oz)    Exam:   General:  Caucasian female, no acute distress, lying in bed, pleasant and conversant  HEENT:  PERRL, anicteric, noninjected  Cardiovascular:  Irregularly irregular, no murmurs, rubs, gallops, 2+ pulses, warm extremities.    Respiratory:  CTAB  Abdomen:  NABS, soft, nondistended, nontender, no organomegaly  MSK:  Normal tone and bulk, no LEE.   Neuro:  III-XII grossly intact  Data Reviewed: Basic Metabolic Panel:  Lab 11/23/12 8119 11/22/12 0504 11/21/12 0530 11/20/12 0434 11/19/12 1235  NA 138 137 137 140 140  K 5.6* 5.3* 5.1 4.8 4.8  CL 102 105 105 110 106  CO2 27 21 21 21  17*  GLUCOSE 114* 102* 116* 131* 106*  BUN 38* 34* 35* 37* 38*  CREATININE  1.38* 1.38* 1.48* 1.44* 1.38*  CALCIUM 9.1 8.8 8.7 8.5 9.8  MG -- -- -- -- --  PHOS -- -- -- -- --   Liver Function Tests:  Lab 11/20/12 0434 11/19/12 1235  AST 12 14  ALT 6 9  ALKPHOS 99 126*  BILITOT 0.3 0.5  PROT 5.4* 6.4  ALBUMIN 2.8* 3.6   No results found for this basename: LIPASE:5,AMYLASE:5 in the last 168 hours No results found for this basename: AMMONIA:5 in the last 168 hours CBC:  Lab 11/23/12 0415 11/22/12 0504 11/21/12 0530 11/20/12 0434 11/19/12 1235  WBC 8.7 7.7 8.8 7.8 15.8*  NEUTROABS -- -- -- -- --  HGB 10.4* 10.2* 10.1* 10.7* 12.9  HCT 32.2* 30.7* 30.4* 32.4* 38.5   MCV 96.1 94.8 94.7 94.2 93.4  PLT 248 226 216 233 276   Cardiac Enzymes:  Lab 11/19/12 1235  CKTOTAL --  CKMB --  CKMBINDEX --  TROPONINI <0.30   BNP (last 3 results)  Basename 11/19/12 1235  PROBNP 910.2*   CBG:  Lab 11/23/12 0816 11/22/12 1118 11/22/12 0737 11/21/12 0734 11/20/12 0748  GLUCAP 98 116* 89 135* 133*    Recent Results (from the past 240 hour(s))  URINE CULTURE     Status: Normal   Collection Time   11/19/12 12:48 PM      Component Value Range Status Comment   Specimen Description URINE, CATHETERIZED   Final    Special Requests NONE   Final    Culture  Setup Time 11/19/2012 18:58   Final    Colony Count >=100,000 COLONIES/ML   Final    Culture KLEBSIELLA PNEUMONIAE   Final    Report Status 11/21/2012 FINAL   Final    Organism ID, Bacteria KLEBSIELLA PNEUMONIAE   Final      Studies: Ct Hip Left Wo Contrast  11/22/2012  *RADIOLOGY REPORT*  Clinical Data: Left hip pain.  CT OF THE LEFT HIP WITHOUT CONTRAST  Technique:  Multidetector CT imaging was performed according to the standard protocol. Multiplanar CT image reconstructions were also generated.  Comparison: Radiographs 11/19/2012.  Findings: There is an old healed inferior pubic rami fracture but more medially there is a new/acute fracture. There is also a fracture involving the medial cortex of the upper femur.  The acetabular component is intact.  IMPRESSION:  1.  Acute on chronic inferior pubic ramus fracture. 2.  Small cortical fracture involving the medial aspect of the upper femur.   Original Report Authenticated By: Rudie Meyer, M.D.     Scheduled Meds:    . ALPRAZolam  0.25 mg Oral BID  . cefTRIAXone (ROCEPHIN)  IV  1 g Intravenous Q24H  . cholecalciferol  1,000 Units Oral Daily  . dabigatran  75 mg Oral BID  . diclofenac sodium  2 g Topical QID  . digoxin  0.125 mg Oral Daily  . feeding supplement  237 mL Oral TID BM  . furosemide  10 mg Oral Daily  . metoprolol  50 mg Oral BID  .  multivitamin  1 tablet Oral Daily  . omega-3 acid ethyl esters  1 g Oral Daily  . sodium chloride  3 mL Intravenous Q12H   Continuous Infusions:    . sodium chloride 75 mL/hr (11/23/12 0902)  . sodium chloride Stopped (11/19/12 1445)    Principal Problem:  *Syncope and collapse Active Problems:  Atrial fibrillation  CKD (chronic kidney disease), stage III  UTI (lower urinary tract infection)  Leukocytosis  Pacemaker  Time spent: 30 min    Vetta Couzens, [facility]  [facility] Pager [phone]. If 8PM-8AM, please contact night-coverage at [email or web address], password [facility] 11/23/2012, 10:19 PM  LOS: 4 days

## 2012-11-23 NOTE — Progress Notes (Signed)
Physical Therapy Treatment Patient Details Name: Sophia Oliver MRN: [id number] DOB: Nov 15, 1923 Today's Date: 11/23/2012 Time: 4098-1191 PT Time Calculation (min): 26 min  PT Assessment / Plan / Recommendation Comments on Treatment Session  Pt having freq falls with c/o dizzyness.  This time she fx L femur proximal to THR nondisplaced so no surgical intervention.  Pt is however TTWB. Pt lives with spouse. Assisted OOB to recliner with increased time and co 8/10 L hip pain.  Pain meds requested and ICE applied.     Follow Up Recommendations  SNF     Does the patient have the potential to tolerate intense rehabilitation     Barriers to Discharge        Equipment Recommendations  None recommended by PT    Recommendations for Other Services    Frequency Min 3X/week   Plan Discharge plan remains appropriate    Precautions / Restrictions Precautions Precautions: Fall Precaution Comments: nondisplaced L proximal to THR femur fx........no surgical intervention....TTWB Restrictions Weight Bearing Restrictions: Yes LLE Weight Bearing: Touchdown weight bearing Other Position/Activity Restrictions: per last orth MD PN   Pertinent Vitals/Pain C/o 9/10 L hip pain RN notified ICE applied    Mobility  Bed Mobility Bed Mobility: Supine to Sit;Sitting - Scoot to Edge of Bed Details for Bed Mobility Assistance: MOD assist to L LE and increased time plus HOB elevated 45' Transfers Transfers: Sit to Stand;Stand to [employer] Transfers Sit to Stand: 3: Mod assist Stand to Sit: 3: Mod assist Stand Pivot Transfers: 2: Max assist Details for Transfer Assistance: sit to stand to pull up briefs then stand pivot 1/4 turn to pt's R from bed to recliner while TTWB L LE.  Pt was unsteady and required increased assist to perform. Ambulation/Gait Ambulation/Gait Assistance Details: Did not attempt amb due to 8/10 pain during transfer.  RN notifed for pain meds upon pt request.  ICE also applied.    PT Goals                                                      progressing    Visit Information  Last PT Received On: 11/23/12 Assistance Needed: +1    Subjective Data  Subjective: I want to get out of bed Patient Stated Goal: to go home but they probably won't let me   Cognition       Balance     End of Session PT - End of Session Equipment Utilized During Treatment: Gait belt Activity Tolerance: Patient limited by pain Patient left: in chair;with call bell/phone within reach;with family/visitor present Nurse Communication: Patient requests pain meds   Felecia Shelling  PTA [facility]  Acute  Rehab Pager     [phone]

## 2012-11-24 DIAGNOSIS — E875 Hyperkalemia: Secondary | ICD-10-CM

## 2012-11-24 LAB — BASIC METABOLIC PANEL
BUN: 39 mg/dL — ABNORMAL HIGH (ref 6–23)
Chloride: 98 mEq/L (ref 96–112)
GFR calc Af Amer: 40 mL/min — ABNORMAL LOW (ref >90)
Glucose, Bld: 127 mg/dL — ABNORMAL HIGH (ref 70–99)
Potassium: 5.4 mEq/L — ABNORMAL HIGH (ref 3.5–5.1)

## 2012-11-24 NOTE — Progress Notes (Signed)
Physical Therapy Treatment Patient Details Name: Sophia Oliver MRN: [id number] DOB: Mar 02, 1924 Today's Date: 11/24/2012 Time: 1125-1203 PT Time Calculation (min): 38 min  PT Assessment / Plan / Recommendation Comments on Treatment Session  Pt with no c/o dizziness throughout session.  Pt reports no spinning sensation and describes dizziness as "weary."  Pt also states she has had vertigo in the past and this does not feel the same.  Pt reports hitting her head upon syncope episode at home and imaging acutely negative.  Pt reports sore neck with L and R rotation.  Tested smooth pursuits, saccades, VOR and thrust and all negative.  Due to no symptoms and negative testing did not continue with BPPV testing especially with femur fx.  Pt reports she believes her syncope episode was due to a new medication her MD prescribed for her urinary incontinence (which she reports started in SNF).  Pt's orthostatics taken (please see vitals section of note).  Pt assisted to recliner and then performed exercises.    Follow Up Recommendations  SNF     Does the patient have the potential to tolerate intense rehabilitation     Barriers to Discharge        Equipment Recommendations  None recommended by PT    Recommendations for Other Services    Frequency     Plan Discharge plan remains appropriate;Frequency remains appropriate    Precautions / Restrictions Precautions Precautions: Fall Precaution Comments: nondisplaced L proximal to THR femur fx........no surgical intervention....TTWB Restrictions Weight Bearing Restrictions: Yes LLE Weight Bearing: Touchdown weight bearing   Pertinent Vitals/Pain 3/10 L hip pain, RN notified, repositioned in recliner  Orthostatics: Supine 119/38 mmHg Sitting 135/70 mmHg Standing (WBing through RW) 155/125 mmHg  BP after exercises sitting in recliner: 118/58 mmHg and HR 86 bpm    Mobility  Bed Mobility Bed Mobility: Supine to Sit Supine to Sit: 3: Mod  assist Details for Bed Mobility Assistance: assist for trunk upright, pt able to bring LEs over EOB with verbal cues for maintaining precautions Transfers Transfers: Sit to Stand;Stand to Sit;Stand Pivot Transfers Sit to Stand: 4: Min assist;With upper extremity assist;From bed Stand to Sit: 4: Min assist;With upper extremity assist;To chair/3-in-1 Stand Pivot Transfers: 4: Min assist Details for Transfer Assistance: assist to rise and control descent, pt did well with TTWB L LE, verbal cues for sliding mostly R LE over to recliner (pt technique not to hop) while using RW    Exercises Total Joint Exercises Ankle Circles/Pumps: AROM;10 reps;Both;Supine Gluteal Sets: AROM;Both;10 reps;Supine Towel Squeeze: AROM;Both;20 reps;Supine Short Arc Quad: AROM;Both;AAROM;10 reps;Supine;Other (comment) (aarom for L LE ) Hip ABduction/ADduction: AROM;Strengthening;Both;10 reps;Supine Long Arc Quad: AROM;Strengthening;Both;10 reps;Seated Marching in Standing: AROM;Strengthening;Both;10 reps;Seated   PT Diagnosis:    PT Problem List:   PT Treatment Interventions:     PT Goals Acute Rehab PT Goals PT Goal: Supine/Side to Sit - Progress: Progressing toward goal PT Goal: Sit to Stand - Progress: Progressing toward goal PT Goal: Stand to Sit - Progress: Progressing toward goal PT Goal: Perform Home Exercise Program - Progress: Progressing toward goal  Visit Information  Last PT Received On: 11/24/12 Assistance Needed: +1    Subjective Data  Subjective: I'm not dizzy today.   Cognition  Overall Cognitive Status: Appears within functional limits for tasks assessed/performed Arousal/Alertness: Awake/alert Orientation Level: Appears intact for tasks assessed Behavior During Session: [facility] for tasks performed    Balance     End of Session PT - End of Session Equipment Utilized  During Treatment: Gait belt Activity Tolerance: Patient tolerated treatment well Patient left: in chair;with call  bell/phone within reach Nurse Communication: Patient requests pain meds;Other (comment) (BP)   GP     Dshawn Mcnay,KATHrine E 11/24/2012, 1:18 PM  Zenovia Jarred, PT, DPT 11/24/2012 Pager: [phone]

## 2012-11-24 NOTE — Discharge Summary (Signed)
Physician Discharge Summary  Sophia Oliver ZOX:[id number] DOB: 12/11/1923 DOA: 11/19/2012  PCP: [name], MD  Admit date: 11/19/2012 Discharge date: 11/25/2012  Recommendations for Outpatient Follow-up:  1. Transfer to SNF for ongoing care  2. Falls precautions 3. Toe touch (touch down) weight bearing of the left lower extremity until follow up with Dr. Charlann Boxer, Orthopedics, in 2-4 weeks  Discharge Diagnoses:  Principal Problem:  *Syncope and collapse Active Problems:  Atrial fibrillation  CKD (chronic kidney disease), stage III  UTI (lower urinary tract infection)  Hyperkalemia   Discharge Condition: stable, improved  Diet recommendation: healthy heart, low potassium diet  Wt Readings from Last 3 Encounters:  11/25/12 49.8 kg (109 lb 12.6 oz)  06/30/12 49.896 kg (110 lb)  06/30/12 49.896 kg (110 lb)    History of present illness:   79 year old female with past medical history or paroxysmal atrial tachycardia / atrial fibrillation, tachycardia/bradycardia syndrome status post pacemaker placement, CKD, HTN who presented to ED status post fall at home. Patient does not recall prodromal symptoms prior to the fall but believes she did not have chest pain, shortness of breath or palpitations. She reports feeling lightheaded on and off for past few weeks prior to this admission but cant remember exactly if she felt lightheaded prior to fall. She reports not losing consciousness completely. Patient reported no abdominal pain, no nausea or vomiting. No reports of blood in stool or urine.  In ED, urinalysis is positive for infection. Her BMET revealed creatinine at baseline of 1.38. Her INR was elevated at 1.56 but patient is only on pradaxa and does not take other blood thinners. CT head and cervical pine showed no fractures.   Hospital Course:   *Near syncope and fall: May have been side effect of oxybutynin which has a relatively short half life or due to UTI. No problems with pacer  per cardiology. Lightheadedness still not resolved a week after stopping medication and patient reports that she had some feelings of lightheadedness even before starting the oxybutynin.  Oxybutynin may have exacerbated her underlying symptoms however.  Her symptoms are not necessarily triggered by motion and happen spontaneously while lying in bed also.  She may have had a small stroke, but unusual to have symptoms that come and go.  No evidence to suggest seizure activity.   Cardiac enzymes negative  Not able to complete MRI brain as pacemaker not compatable  Repeat orthostatics neg  Dig level subtherapeutic  ECHO demonstrates stable LV function  Carotid duplex negative  PT recommending SNF  Consider neurology referral as an outpatient if not improving.   Atrial fibrillation  Continue pradaxa  Continue metoprolol 50 mg BID  Continue digoxin   H/O tachy / brady syndrome and pacemaker, diastolic and systolic CHF  Continue digoxin and BB  Not on spironolactone or ACEI/ARB due to hyperkalemia  Continue low dose lasix instead  BNP on admission 910   CKD (chronic kidney disease), stage III  Creatinine on admission 1.38  Appears to be at patient's baseline   UTI (lower urinary tract infection) Klebsiella R to amp and I to nitro, otherwise sensitive  Rocephin treated   Leukocytosis resolved.  Secondary to UTI    Left hip pain: Severe and making it difficult to bear weight  - CT left hip: Positive for acute on chronic inferior pubic ramus fracture and small cortical fracture of the medial aspect of the upper femur  - Appreciate orthopedics assistance     Consultations:  ortho  Discharge Exam: Filed Vitals:   11/25/12 0945  BP: 104/51  Pulse: 61  Temp:   Resp:    Filed Vitals:   11/24/12 1521 11/24/12 2100 11/25/12 0620 11/25/12 0945  BP: 131/69 120/60 122/65 104/51  Pulse: 82 64 68 61  Temp: 97.8 F (36.6 C) 98.4 F (36.9 C) 98.7 F (37.1 C)   TempSrc: Oral  Oral Oral   Resp: 18 20 18    Height:      Weight:   49.8 kg (109 lb 12.6 oz)   SpO2: 100%  100%     General: pleasant/cooeprative Cardiovascular: rrr Respiratory: clear  Discharge Instructions     Medication List     As of 11/25/2012 10:34 AM    ASK your doctor about these medications         ALPRAZolam 0.25 MG tablet   Commonly known as: XANAX   Take 0.25 mg by mouth 2 (two) times daily.      cholecalciferol 1000 UNITS tablet   Commonly known as: VITAMIN D   Take 1,000 Units by mouth daily.      Cinnamon 500 MG capsule   Take 500 mg by mouth every morning.      dabigatran 75 MG Caps   Commonly known as: PRADAXA   Take 75 mg by mouth 2 (two) times daily.      digoxin 0.125 MG tablet   Commonly known as: LANOXIN   Take 1 tablet (0.125 mg total) by mouth daily.      fish oil-omega-3 fatty acids 1000 MG capsule   Take 1 g by mouth daily.      HYDROcodone-acetaminophen 5-325 MG per tablet   Commonly known as: NORCO/VICODIN   Take 1 tablet by mouth every 6 (six) hours as needed. pain      metoprolol 50 MG tablet   Commonly known as: LOPRESSOR   Take 50 mg by mouth 2 (two) times daily.      spironolactone 25 MG tablet   Commonly known as: ALDACTONE   Take 25 mg by mouth every morning.            The results of significant diagnostics from this hospitalization (including imaging, microbiology, ancillary and laboratory) are listed below for reference.    Significant Diagnostic Studies: Dg Chest 2 View  11/19/2012  *RADIOLOGY REPORT*  Clinical Data: Fall, shortness of breath  CHEST - 2 VIEW  Comparison: 06/30/2012  Findings: Chronic interstitial markings/emphysematous changes.  No focal consolidation. No pleural effusion or pneumothorax.  Lucency along the right lateral chest wall reflects a skin fold.  The heart is normal in size.  Right subclavian pacemaker.  Chronic degenerative changes of the left shoulder/humeral head. Prior right shoulder arthroplasty.   Mild degenerative changes of the visualized thoracolumbar spine.  IMPRESSION: No evidence of acute cardiopulmonary disease.  Chronic interstitial markings/emphysematous changes.   Original Report Authenticated By: Charline Bills, M.D.    Dg Hip Complete Left  11/19/2012  *RADIOLOGY REPORT*  Clinical Data: Fall, left hip pain.  LEFT HIP - COMPLETE 2+ VIEW  Comparison: 06/30/2012  Findings: Changes of left hip replacement.  Diffuse osteopenia.  No hardware or acute bony abnormality.  No acute fracture, subluxation or dislocation.  Probable old healed left inferior pubic ramus fracture.  IMPRESSION: Left replacement.  No acute bony abnormality.   Original Report Authenticated By: Charlett Nose, M.D.    Ct Head Wo Contrast  11/19/2012  *RADIOLOGY REPORT*  Clinical Data:  Dizziness, fall, hit  head  CT HEAD WITHOUT CONTRAST CT CERVICAL SPINE WITHOUT CONTRAST  Technique:  Multidetector CT imaging of the head and cervical spine was performed following the standard protocol without intravenous contrast.  Multiplanar CT image reconstructions of the cervical spine were also generated.  Comparison:  None.  CT HEAD  Findings: No evidence of parenchymal hemorrhage or extra-axial fluid collection. No mass lesion, mass effect, or midline shift.  No CT evidence of acute infarction.  Subcortical white matter and periventricular small vessel ischemic changes.  Intracranial atherosclerosis.  Global cortical atrophy.  No ventriculomegaly.  Partial opacification of the right sphenoid sinus.  Visualized paranasal sinuses and mastoid air cells are otherwise clear.  No evidence of calvarial fracture.  IMPRESSION: No evidence of acute intracranial abnormality.  Atrophy with small vessel ischemic changes and intracranial atherosclerosis.  CT CERVICAL SPINE  Findings: Straightening of the cervical spine.  No evidence of fracture or dislocation.  The vertebral body heights are maintained. Dens appears intact.  No prevertebral soft tissue  swelling.  Moderate multilevel degenerative changes, most prominent at C4-5 and C5-6.  4 mm anterolisthesis of C3 on C4.  IMPRESSION: No evidence of traumatic injury to the cervical spine.  Moderate multilevel degenerative changes.  4 mm anterolisthesis of C3 on C4.   Original Report Authenticated By: Charline Bills, M.D.    Ct Cervical Spine Wo Contrast  11/19/2012  *RADIOLOGY REPORT*  Clinical Data:  Dizziness, fall, hit head  CT HEAD WITHOUT CONTRAST CT CERVICAL SPINE WITHOUT CONTRAST  Technique:  Multidetector CT imaging of the head and cervical spine was performed following the standard protocol without intravenous contrast.  Multiplanar CT image reconstructions of the cervical spine were also generated.  Comparison:  None.  CT HEAD  Findings: No evidence of parenchymal hemorrhage or extra-axial fluid collection. No mass lesion, mass effect, or midline shift.  No CT evidence of acute infarction.  Subcortical white matter and periventricular small vessel ischemic changes.  Intracranial atherosclerosis.  Global cortical atrophy.  No ventriculomegaly.  Partial opacification of the right sphenoid sinus.  Visualized paranasal sinuses and mastoid air cells are otherwise clear.  No evidence of calvarial fracture.  IMPRESSION: No evidence of acute intracranial abnormality.  Atrophy with small vessel ischemic changes and intracranial atherosclerosis.  CT CERVICAL SPINE  Findings: Straightening of the cervical spine.  No evidence of fracture or dislocation.  The vertebral body heights are maintained. Dens appears intact.  No prevertebral soft tissue swelling.  Moderate multilevel degenerative changes, most prominent at C4-5 and C5-6.  4 mm anterolisthesis of C3 on C4.  IMPRESSION: No evidence of traumatic injury to the cervical spine.  Moderate multilevel degenerative changes.  4 mm anterolisthesis of C3 on C4.   Original Report Authenticated By: Charline Bills, M.D.    Ct Hip Left Wo Contrast  11/22/2012   *RADIOLOGY REPORT*  Clinical Data: Left hip pain.  CT OF THE LEFT HIP WITHOUT CONTRAST  Technique:  Multidetector CT imaging was performed according to the standard protocol. Multiplanar CT image reconstructions were also generated.  Comparison: Radiographs 11/19/2012.  Findings: There is an old healed inferior pubic rami fracture but more medially there is a new/acute fracture. There is also a fracture involving the medial cortex of the upper femur.  The acetabular component is intact.  IMPRESSION:  1.  Acute on chronic inferior pubic ramus fracture. 2.  Small cortical fracture involving the medial aspect of the upper femur.   Original Report Authenticated By: Rudie Meyer, M.D.  Microbiology: Recent Results (from the past 240 hour(s))  URINE CULTURE     Status: Normal   Collection Time   11/19/12 12:48 PM      Component Value Range Status Comment   Specimen Description URINE, CATHETERIZED   Final    Special Requests NONE   Final    Culture  Setup Time 11/19/2012 18:58   Final    Colony Count >=100,000 COLONIES/ML   Final    Culture KLEBSIELLA PNEUMONIAE   Final    Report Status 11/21/2012 FINAL   Final    Organism ID, Bacteria KLEBSIELLA PNEUMONIAE   Final      Labs: Basic Metabolic Panel:  Lab 11/25/12 1478 11/24/12 0445 11/23/12 0415 11/22/12 0504 11/21/12 0530  NA 135 135 138 137 137  K 5.6* 5.4* 5.6* 5.3* 5.1  CL 97 98 102 105 105  CO2 28 28 27 21 21   GLUCOSE 99 127* 114* 102* 116*  BUN 41* 39* 38* 34* 35*  CREATININE 1.48* 1.35* 1.38* 1.38* 1.48*  CALCIUM 9.2 9.2 9.1 8.8 8.7  MG -- -- -- -- --  PHOS -- -- -- -- --   Liver Function Tests:  Lab 11/20/12 0434 11/19/12 1235  AST 12 14  ALT 6 9  ALKPHOS 99 126*  BILITOT 0.3 0.5  PROT 5.4* 6.4  ALBUMIN 2.8* 3.6   No results found for this basename: LIPASE:5,AMYLASE:5 in the last 168 hours No results found for this basename: AMMONIA:5 in the last 168 hours CBC:  Lab 11/23/12 0415 11/22/12 0504 11/21/12 0530 11/20/12  0434 11/19/12 1235  WBC 8.7 7.7 8.8 7.8 15.8*  NEUTROABS -- -- -- -- --  HGB 10.4* 10.2* 10.1* 10.7* 12.9  HCT 32.2* 30.7* 30.4* 32.4* 38.5  MCV 96.1 94.8 94.7 94.2 93.4  PLT 248 226 216 233 276   Cardiac Enzymes:  Lab 11/19/12 1235  CKTOTAL --  CKMB --  CKMBINDEX --  TROPONINI <0.30   BNP: BNP (last 3 results)  Basename 11/19/12 1235  PROBNP 910.2*   CBG:  Lab 11/25/12 0834 11/24/12 0749 11/23/12 0816 11/22/12 1118 11/22/12 0737  GLUCAP 87 82 98 116* 89    Time coordinating discharge: 35 minutes  Signed:  Benjamine Mola Demar Shad  [facility] 11/25/2012, 10:34 AM

## 2012-11-24 NOTE — Progress Notes (Signed)
[facility] PROGRESS NOTE  Sophia Oliver WJX:[id number] DOB: 08-10-1924 DOA: 11/19/2012 PCP: [name], MD  Assessment/Plan  *Near syncope and fall:  May have been side effect of oxybutynin which has a relatively [name] half life.  No problems with pacer per cardiology.  Lightheadedness still not resolved a week after stopping medication Cardiac enzymes negative  Not able to complete MRI brain as pacer not compatable Repeat orthostatics neg Dig level subtherapeutic ECHO demonstrates stable LV function Carotid duplex negative PT recommending SNF  Active Problems:  Atrial fibrillation  Continue pradaxa  Continue metoprolol 50 mg BID  Continue digoxin  H/O tachy / brady syndrome and pacemaker, diastolic and systolic CHF  Continue digoxin and BB Not on spironolactone or ACEI/ARB due to hyperkalemia Continue low dose lasix instead BNP on admission 910 Repeat in AM  CKD (chronic kidney disease), stage III  Creatinine on admission 1.38  Appears to be at patient's baseline  Acute on chronic inferior pubic ramus fracture and small cortical fracture of the medial aspect of the upper femur -  Appreciate orthopedics assistance -  TTWB on the left leg until f/u -  F/u with Dr. Charlann Boxer in 2-4 wks  Diet:  Low potassium diet Access:  PIV IVF:  none Proph:  pradaxa  Code Status: Full code Family Communication:  Spoke with patient and son today Disposition Plan:  Likely to SNF tomorrow   Consultants:  Cardiology for pacer interrogation  Procedures:  Pacer interrogation  Antibiotics:  Ceftriaxone 1/17 >> 1/20  HPI/Subjective:  Lightheadedness is stable.  Denies room spinning sensation.  Denies N/V/D/C, dysuria, but has urinary incontinence.  Denies CP, SOB.  Still has severe pain in the LLE that makes it difficult to bear weight.    Objective: Filed Vitals:   11/24/12 0906 11/24/12 1300 11/24/12 1301 11/24/12 1521  BP: 146/60 115/66 96/47 131/69  Pulse:    82    Temp:    97.8 F (36.6 C)  TempSrc:    Oral  Resp:    18  Height:      Weight:      SpO2:    100%    Intake/Output Summary (Last 24 hours) at 11/24/12 1555 Last data filed at 11/24/12 0900  Gross per 24 hour  Intake    630 ml  Output      0 ml  Net    630 ml   Filed Weights   11/22/12 0500 11/22/12 0644 11/24/12 0500  Weight: 50.1 kg (110 lb 7.2 oz) 50.1 kg (110 lb 7.2 oz) 49.7 kg (109 lb 9.1 oz)    Exam:   General:  Caucasian female, no acute distress, lying in bed, pleasant and conversant  HEENT:  MMM  Cardiovascular:  Irregularly irregular, no murmurs, rubs, gallops, 2+ pulses, warm extremities.    Respiratory:  CTAB  Abdomen:  NABS, soft, nondistended, nontender, no organomegaly  MSK:  Normal tone and bulk, no LEE.   Neuro:  III-XII grossly intact, pain with strength testing of LLE  Data Reviewed: Basic Metabolic Panel:  Lab 11/24/12 2130 11/23/12 0415 11/22/12 0504 11/21/12 0530 11/20/12 0434  NA 135 138 137 137 140  K 5.4* 5.6* 5.3* 5.1 4.8  CL 98 102 105 105 110  CO2 28 27 21 21 21   GLUCOSE 127* 114* 102* 116* 131*  BUN 39* 38* 34* 35* 37*  CREATININE 1.35* 1.38* 1.38* 1.48* 1.44*  CALCIUM 9.2 9.1 8.8 8.7 8.5  MG -- -- -- -- --  PHOS -- -- -- -- --   Liver Function Tests:  Lab 11/20/12 0434 11/19/12 1235  AST 12 14  ALT 6 9  ALKPHOS 99 126*  BILITOT 0.3 0.5  PROT 5.4* 6.4  ALBUMIN 2.8* 3.6   No results found for this basename: LIPASE:5,AMYLASE:5 in the last 168 hours No results found for this basename: AMMONIA:5 in the last 168 hours CBC:  Lab 11/23/12 0415 11/22/12 0504 11/21/12 0530 11/20/12 0434 11/19/12 1235  WBC 8.7 7.7 8.8 7.8 15.8*  NEUTROABS -- -- -- -- --  HGB 10.4* 10.2* 10.1* 10.7* 12.9  HCT 32.2* 30.7* 30.4* 32.4* 38.5  MCV 96.1 94.8 94.7 94.2 93.4  PLT 248 226 216 233 276   Cardiac Enzymes:  Lab 11/19/12 1235  CKTOTAL --  CKMB --  CKMBINDEX --  TROPONINI <0.30   BNP (last 3 results)  Basename 11/19/12 1235   PROBNP 910.2*   CBG:  Lab 11/24/12 0749 11/23/12 0816 11/22/12 1118 11/22/12 0737 11/21/12 0734  GLUCAP 82 98 116* 89 135*    Recent Results (from the past 240 hour(s))  URINE CULTURE     Status: Normal   Collection Time   11/19/12 12:48 PM      Component Value Range Status Comment   Specimen Description URINE, CATHETERIZED   Final    Special Requests NONE   Final    Culture  Setup Time 11/19/2012 18:58   Final    Colony Count >=100,000 COLONIES/ML   Final    Culture KLEBSIELLA PNEUMONIAE   Final    Report Status 11/21/2012 FINAL   Final    Organism ID, Bacteria KLEBSIELLA PNEUMONIAE   Final      Studies: No results found.  Scheduled Meds:    . ALPRAZolam  0.25 mg Oral BID  . cholecalciferol  1,000 Units Oral Daily  . dabigatran  75 mg Oral BID  . diclofenac sodium  2 g Topical QID  . digoxin  0.125 mg Oral Daily  . feeding supplement  237 mL Oral TID BM  . furosemide  10 mg Oral Daily  . metoprolol  50 mg Oral BID  . multivitamin  1 tablet Oral Daily  . omega-3 acid ethyl esters  1 g Oral Daily  . sodium chloride  3 mL Intravenous Q12H   Continuous Infusions:    Principal Problem:  *Syncope and collapse Active Problems:  Atrial fibrillation  CKD (chronic kidney disease), stage III  UTI (lower urinary tract infection)  Leukocytosis  Pacemaker  Hyperkalemia    Time spent: 30 min    Harmonii Karle  [facility] Pager [phone]. If 7PM-7AM, please contact night-coverage at [email or web address], password [facility] 11/24/2012, 3:55 PM  LOS: 5 days

## 2012-11-24 NOTE — Evaluation (Signed)
Occupational Therapy Evaluation Patient Details Name: Sophia Oliver MRN: [id number] DOB: 1923/12/17 Today's Date: 11/24/2012 Time: 8416-6063 OT Time Calculation (min): 38 min  OT Assessment / Plan / Recommendation Clinical Impression  This 78 year old female was admitted after a fall .  She sustained a periprosthetic fx which is being managed conservatively with TDWB.  Pt was mod I with all ADLs/IADLs prior to admission. She is appropriate for skilled OT to increase independence with adls.     OT Assessment  Patient needs continued OT Services    Follow Up Recommendations  SNF    Barriers to Discharge      Equipment Recommendations  None recommended by OT    Recommendations for Other Services    Frequency  Min 2X/week    Precautions / Restrictions Precautions Precautions: Fall Precaution Comments: nondisplaced L proximal to THR femur fx........no surgical intervention....TTWB Restrictions Weight Bearing Restrictions: Yes LLE Weight Bearing: Touchdown weight bearing   Pertinent Vitals/Pain Pt reports pain as tolerable.  Sitting BP 115/66 Standing 96/47.  Pt reported lightheadedness    ADL  Equipment Used: Rolling walker;Gait belt Transfers/Ambulation Related to ADLs: Performed sit to stand only, to get BP,  Pt's BP dropped and she was symptomatic.  PT asked pt to stay up in chair one more hour.  Did not need to use commode ADL Comments: pt is able to complete adls with set up for UB and min A for LB for sit to stand.  Performed socks.  Pt reports dizziness she experienced was not vertigo.  It was lightheadedness.  Did not perform vestibular eval.      OT Diagnosis: Generalized weakness  OT Problem List: Decreased strength;Decreased activity tolerance;Impaired balance (sitting and/or standing);Impaired UE functional use;Cardiopulmonary status limiting activity (dizziness) OT Treatment Interventions: Self-care/ADL training;DME and/or AE instruction;Therapeutic  activities;Patient/family education;Balance training   OT Goals Acute Rehab OT Goals OT Goal Formulation: With patient Time For Goal Achievement: 12/08/12 Potential to Achieve Goals: Good ADL Goals Pt Will Perform Lower Body Bathing: with supervision;Sit to stand from chair ADL Goal: Lower Body Bathing - Progress: Goal set today Pt Will Perform Lower Body Dressing: with supervision;Sit to stand from chair ADL Goal: Lower Body Dressing - Progress: Goal set today Pt Will Transfer to Toilet: with min assist;Ambulation;3-in-1 (min guard) ADL Goal: Toilet Transfer - Progress: Goal set today Pt Will Perform Toileting - Hygiene: with supervision;Sit to stand from 3-in-1/toilet ADL Goal: Toileting - Hygiene - Progress: Goal set today  Visit Information  Last OT Received On: 11/24/12 Assistance Needed: +1    Subjective Data  Subjective: I just felt dizzy (lightheaded) and knocked the whole walker over) Patient Stated Goal: really wants to go home--just finished rehab in Nov but knows she has to go there   Prior Functioning     Home Living Lives With: Spouse Available Help at Discharge: Available 24 hours/day Type of Home: House Home Access: Stairs to enter [employer] of Steps: 2 Entrance Stairs-Rails: None Home Layout: Able to live on main level with bedroom/bathroom Bathroom Shower/Tub: Walk-in shower;Tub/shower unit Bathroom Toilet: Handicapped height Home Adaptive Equipment: Bedside commode/3-in-1;Walker - rolling Prior Function Level of Independence: Independent with assistive device(s) Comments: completed all iadls Communication Communication: No difficulties         Vision/Perception     Cognition  Overall Cognitive Status: Appears within functional limits for tasks assessed/performed Arousal/Alertness: Awake/alert Orientation Level: Appears intact for tasks assessed Behavior During Session: [facility] for tasks performed    Extremity/Trunk Assessment  Right Upper Extremity Assessment RUE ROM/Strength/Tone: Deficits RUE ROM/Strength/Tone Deficits: can lift actively to 90 and tolerates more AAROM.  Pt states she used to be able to lift higher.   Arthritic changes in bil hands but this does not affect function Left Upper Extremity Assessment LUE ROM/Strength/Tone: Deficits LUE ROM/Strength/Tone Deficits: pt very limited with L shoulder, approximately 10 degrees AROM.  She has had surgical repair done to this shoulder also.       Mobility  Transfers Sit to Stand: 4: Min assist;With upper extremity assist;From chair Stand to Sit: 4: Min assist;With upper extremity assist;To chair/3-in-1 Details for Transfer Assistance: assist to rise and control descent    Shoulder Instructions     Exercise    Balance Balance Balance Assessed: Yes Static Standing Balance Static Standing - Balance Support: Bilateral upper extremity supported Static Standing - Level of Assistance: 4: Min assist (dizzy when standing)   End of Session OT - End of Session Activity Tolerance: Other (comment) (limited by dizziness) Patient left: in chair;with call bell/phone within reach  GO     Brylin Stanislawski 11/24/2012, 1:52 PM Marica Otter, OTR/L [phone] 11/24/2012

## 2012-11-25 LAB — BASIC METABOLIC PANEL
BUN: 41 mg/dL — ABNORMAL HIGH (ref 6–23)
Chloride: 97 mEq/L (ref 96–112)
GFR calc Af Amer: 36 mL/min — ABNORMAL LOW (ref >90)
Potassium: 5.6 mEq/L — ABNORMAL HIGH (ref 3.5–5.1)

## 2012-11-25 MED ORDER — ALPRAZOLAM 0.25 MG PO TABS: 0.2500 mg | ORAL_TABLET | Freq: Two times a day (BID) | ORAL | Status: DC

## 2012-11-25 MED ORDER — OMEGA-3-ACID ETHYL ESTERS 1 G PO CAPS: 1.0000 g | ORAL_CAPSULE | Freq: Every day | ORAL | Status: DC

## 2012-11-25 MED ORDER — FUROSEMIDE 20 MG PO TABS: 10.0000 mg | ORAL_TABLET | Freq: Every day | ORAL | Status: DC

## 2012-11-25 MED ORDER — PROSIGHT PO TABS: 1.0000 | ORAL_TABLET | Freq: Every day | ORAL | Status: DC

## 2012-11-25 MED ORDER — ENSURE COMPLETE PO LIQD: 237.0000 mL | Freq: Three times a day (TID) | ORAL | Status: DC

## 2012-11-25 MED ORDER — DICLOFENAC SODIUM 1 % TD GEL: 2.0000 g | Freq: Four times a day (QID) | TRANSDERMAL | Status: DC

## 2012-11-25 MED ORDER — HYDROCODONE-ACETAMINOPHEN 5-325 MG PO TABS: 1.0000 | ORAL_TABLET | Freq: Four times a day (QID) | ORAL | Status: DC | PRN

## 2012-11-25 NOTE — Progress Notes (Signed)
Patient is set to discharge to [facility] today. Patient & son, Annette Stable aware. [facility] called for transport. Discharge packet in [facility] including AVS, FL2 & chart copy.   Clinical Social Work Department CLINICAL SOCIAL WORK PLACEMENT NOTE 11/25/2012  Patient:  Sophia Oliver, Sophia Oliver  Account Number:  [id number] Admit date:  11/19/2012  Clinical Social Worker:  Orpah Greek  Date/time:  11/20/2012 12:09 PM  Clinical Social Work is seeking post-discharge placement for this patient at the following level of care:   SKILLED NURSING   (*CSW will update this form in Epic as items are completed)   11/20/2012  Patient/family provided with [facility] Department of Clinical Social Work's list of facilities offering this level of care within the geographic area requested by the patient (or if unable, by the patient's family).  11/20/2012  Patient/family informed of their freedom to choose among providers that offer the needed level of care, that participate in Medicare, Medicaid or managed care program needed by the patient, have an available bed and are willing to accept the patient.  11/20/2012  Patient/family informed of [facility]' ownership interest in [facility], as well as of the fact that they are under no obligation to receive care at this facility.  PASARR submitted to EDS on 11/20/2012 PASARR number received from EDS on 11/20/2012  FL2 transmitted to all facilities in geographic area requested by pt/family on  11/20/2012 FL2 transmitted to all facilities within larger geographic area on   Patient informed that his/her managed care company has contracts with or will negotiate with  certain facilities, including the following:     Patient/family informed of bed offers received:  11/20/2012 Patient chooses bed at [facility] Physician recommends and patient chooses bed at    Patient to be transferred to [facility] on   11/25/2012 Patient to be transferred to facility by [facility]  The following physician request were entered in Epic:   Additional Comments:  Unice Bailey, LCSW [facility] Clinical Social Worker cell #: [phone]

## 2012-11-25 NOTE — Progress Notes (Signed)
Physical Therapy Treatment Patient Details Name: Sophia Oliver MRN: [id number] DOB: [birth year] Today's Date: 11/25/2012 Time: 7829-5621 PT Time Calculation (min): 22 min  PT Assessment / Plan / Recommendation Comments on Treatment Session  Pt with some c/o dizziness during session and increased pain in L hip.  Tolerated stand pivot to chair then rested before attempting to ambulate.  Pt could only tolerate approx 6' before needing to rest again.      Follow Up Recommendations  SNF     Does the patient have the potential to tolerate intense rehabilitation     Barriers to Discharge        Equipment Recommendations  None recommended by PT    Recommendations for Other Services    Frequency Min 3X/week   Plan Discharge plan remains appropriate;Frequency remains appropriate    Precautions / Restrictions Precautions Precautions: Fall Precaution Comments: nondisplaced L proximal to THR femur fx........no surgical intervention....TTWB Restrictions Weight Bearing Restrictions: Yes LLE Weight Bearing: Touchdown weight bearing Other Position/Activity Restrictions: per last orth MD PN   Pertinent Vitals/Pain 8/10 pain, RN made aware of pts request for pain meds.     Mobility  Bed Mobility Bed Mobility: Supine to Sit Supine to Sit: 4: Min assist Details for Bed Mobility Assistance: assist for trunk to attain upright sitting with cues for hand placement to self assist.  Transfers Transfers: Sit to Stand;Stand to Sit;Stand Pivot Transfers Sit to Stand: 4: Min assist;With upper extremity assist;From bed Stand to Sit: 4: Min assist;With upper extremity assist;To chair/3-in-1 Stand Pivot Transfers: 4: Min assist Details for Transfer Assistance: Assist to rise and steady due to pt notably shaky with all mobility.  Cues for hand placement, technique and maintaining TDWB while being up.  Ambulation/Gait Ambulation/Gait Assistance: 4: Min assist;3: Mod assist Ambulation Distance (Feet): 6  Feet Assistive device: Rolling walker Ambulation/Gait Assistance Details: Cues for sequencing/technique with RW, upright posture and increasing UE WB to maintain TDWB on LLE>  Gait Pattern: Step-to pattern;Decreased stance time - left;Decreased step length - right;Trunk flexed;Antalgic Gait velocity: very slow    Exercises Total Joint Exercises Ankle Circles/Pumps: AROM;Both;20 reps Quad Sets: AROM;Both;10 reps Towel Squeeze: AROM;Both;10 reps Heel Slides: AAROM;Left;10 reps Hip ABduction/ADduction: AROM;Strengthening;10 reps;Left   PT Diagnosis:    PT Problem List:   PT Treatment Interventions:     PT Goals Acute Rehab PT Goals PT Goal Formulation: With patient/family Time For Goal Achievement: 12/04/12 Potential to Achieve Goals: Good Pt will go Supine/Side to Sit: with supervision PT Goal: Supine/Side to Sit - Progress: Progressing toward goal Pt will go Sit to Stand: with supervision PT Goal: Sit to Stand - Progress: Progressing toward goal Pt will go Stand to Sit: with supervision PT Goal: Stand to Sit - Progress: Progressing toward goal Pt will Ambulate: 51 - 150 feet;with min assist;with least restrictive assistive device PT Goal: Ambulate - Progress: Progressing toward goal Pt will Perform Home Exercise Program: with supervision, verbal cues required/provided PT Goal: Perform Home Exercise Program - Progress: Progressing toward goal  Visit Information  Last PT Received On: 11/25/12 Assistance Needed: +1    Subjective Data  Subjective: My hip hurts.  Patient Stated Goal: to go home but they probably won't let me   Cognition  Overall Cognitive Status: Appears within functional limits for tasks assessed/performed Arousal/Alertness: Awake/alert Orientation Level: Appears intact for tasks assessed Behavior During Session: [facility] for tasks performed    Balance     End of Session PT - End of Session Equipment  Utilized During Treatment: Gait belt Activity Tolerance:  Patient limited by pain Patient left: in chair;with call bell/phone within reach;with chair alarm set Nurse Communication: Patient requests pain meds;Other (comment)   GP     Ikeem Cleckler, Meribeth Mattes 11/25/2012, 9:20 AM

## 2012-11-25 NOTE — Progress Notes (Signed)
Report called to Ramsey, Charity fundraiser, at [place]. All questions answered. Pt aware of transfer today and agreeable.

## 2012-11-25 NOTE — Progress Notes (Signed)
Pt transferred to [facility] in stable condition and in possession of all personal belongings and transfer packet by ambulance.

## 2013-01-27 ENCOUNTER — Encounter: Payer: Medicare Other | Admitting: Internal Medicine

## 2013-01-29 ENCOUNTER — Ambulatory Visit (INDEPENDENT_AMBULATORY_CARE_PROVIDER_SITE_OTHER): Payer: Medicare Other | Admitting: Internal Medicine

## 2013-01-29 ENCOUNTER — Encounter: Payer: Self-pay | Admitting: Internal Medicine

## 2013-01-29 VITALS — BP 130/70 | HR 80 | Ht 64.5 in | Wt 116.0 lb

## 2013-01-29 DIAGNOSIS — I495 Sick sinus syndrome: Secondary | ICD-10-CM

## 2013-01-29 DIAGNOSIS — T82110D Breakdown (mechanical) of cardiac electrode, subsequent encounter: Secondary | ICD-10-CM

## 2013-01-29 DIAGNOSIS — I4891 Unspecified atrial fibrillation: Secondary | ICD-10-CM

## 2013-01-29 DIAGNOSIS — Z5189 Encounter for other specified aftercare: Secondary | ICD-10-CM

## 2013-01-29 DIAGNOSIS — Z950 Presence of cardiac pacemaker: Secondary | ICD-10-CM

## 2013-01-29 LAB — PACEMAKER DEVICE OBSERVATION
AL THRESHOLD: 0.6 V
BAMS-0001: 170 {beats}/min
BAMS-0002: 8 ms
DEVICE MODEL PM: 313770
RV LEAD AMPLITUDE: 5.9 mv
RV LEAD THRESHOLD: 2.4 V
VENTRICULAR PACING PM: 17

## 2013-01-29 NOTE — Assessment & Plan Note (Signed)
Stable and fnctionional  Would  Like to avoid surgery so will continue to follow

## 2013-01-29 NOTE — Progress Notes (Signed)
Patient Care Team: [name], MD as PCP - General (Internal Medicine)   HPI  Sophia Oliver is a 79 y.o. female seen in followup for a pacemaker implanted for sinus node dysfunction and syncope  She has hadt problems with the ventricular lead characterized by increasing impedances and thresholds. She has a modest amount of pacing in the ventricle.    She is doing relatively well   She has had two hip surgeries    Past Medical History  Diagnosis Date  . Syncope   . Sinus node dysfunction   . S/P cardiac pacemaker procedure     [employer]  . Wide-complex tachycardia   . Cholelithiasis   . Glucose intolerance (impaired glucose tolerance)   . DJD (degenerative joint disease)   . Anxiety   . PONV (postoperative nausea and vomiting)   . Pacemaker   . Chronic kidney disease     frequency  . Breast cancer     left breast - hx of 2003   . Injury of right upper arm     after venipuncture in 3/13 - pt describes pain afterwards- no followup done seen by PCP- no further treatment     Past Surgical History  Procedure Laterality Date  . S/p pacemaker    . Replacement total knee bilateral    . Total shoulder replacement    . Total hip arthroplasty    . Femur fracture surgery    . Hysterectomy, unspecified area    . Masterectomy    . Abdominal hysterectomy    . Breast surgery      left breast - 2003   . Total hip arthroplasty  06/16/2012    Procedure: TOTAL HIP ARTHROPLASTY;  Surgeon: [name], MD;  Location: [facility];  Service: Orthopedics;  Laterality: Left;  . Hip closed reduction  06/30/2012    Procedure: CLOSED MANIPULATION HIP;  Surgeon: [name], MD;  Location: [facility];  Service: Orthopedics;  Laterality: Left;    Current Outpatient Prescriptions  Medication Sig Dispense Refill  . acetaminophen (TYLENOL) 325 MG tablet Take 325 mg by mouth every 6 (six) hours as needed for pain.      [name] ALPRAZolam (XANAX) 0.25 MG tablet Take 1 tablet (0.25 mg total) by mouth 2 (two)  times daily.  20 tablet  0  . cholecalciferol (VITAMIN D) 1000 UNITS tablet Take 1,000 Units by mouth daily.      [name] CINNAMON PO Take by mouth.      . dabigatran (PRADAXA) 75 MG CAPS Take 75 mg by mouth 2 (two) times daily.   60 capsule    . diclofenac sodium (VOLTAREN) 1 % GEL Apply 2 g topically 4 (four) times daily.  1 Tube  0  . feeding supplement (ENSURE COMPLETE) LIQD Take 237 mLs by mouth 3 (three) times daily between meals.      . fish oil-omega-3 fatty acids 1000 MG capsule Take 1 g by mouth daily.      . metoprolol (LOPRESSOR) 50 MG tablet Take 50 mg by mouth 2 (two) times daily.      . multivitamin (PROSIGHT) TABS Take 1 tablet by mouth daily.  30 each    . spironolactone (ALDACTONE) 25 MG tablet Take 25 mg by mouth daily.      . digoxin (LANOXIN) 0.125 MG tablet Take 1 tablet (0.125 mg total) by mouth daily.  30 tablet  0  . furosemide (LASIX) 20 MG tablet Take 0.5 tablets (10 mg  total) by mouth daily.  30 tablet    . HYDROcodone-acetaminophen (NORCO/VICODIN) 5-325 MG per tablet Take 1 tablet by mouth every 6 (six) hours as needed.  15 tablet  0  . omega-3 acid ethyl esters (LOVAZA) 1 G capsule Take 1 capsule (1 g total) by mouth daily.       No current facility-administered medications for this visit.    Allergies  Allergen Reactions  . Codeine Nausea And Vomiting  . Other Nausea And Vomiting    Narcotics  dizziness    Review of Systems negative except from HPI and PMH  Physical Exam BP 130/70  Pulse 80  Ht 5' 4.5" (1.638 m)  Wt 116 lb (52.617 kg)  BMI 19.61 kg/m2 Well developed and nourished in no acute distress HENT normal Neck supple Device pocket well healed; without hematoma or erythema Clear Regular rate and rhythm, no murmurs or gallops Abd-soft with active BS No Clubbing cyanosis edema Skin-warm and dry A & Oriented  Grossly normal sensory and motor function    Assessment and  Plan

## 2013-01-29 NOTE — Assessment & Plan Note (Signed)
Pacing 17%

## 2013-01-29 NOTE — Assessment & Plan Note (Signed)
No afib.

## 2013-01-29 NOTE — Assessment & Plan Note (Signed)
The patient's device was interrogated.  The information was reviewed. No changes were made in the programming.    

## 2013-01-29 NOTE — Patient Instructions (Addendum)
Your physician wants you to follow-up in: 12 months with Dr Logan Bores will receive a reminder letter in the mail two months in advance. If you don't receive a letter, please call our office to schedule the follow-up appointment.  Remote monitoring is used to monitor your Pacemaker of ICD from home. This monitoring reduces the number of office visits required to check your device to one time per year. It allows [place] to keep an eye on the functioning of your device to ensure it is working properly. You are scheduled for a device check from home in 3 mths. You may send your transmission at any time that day. If you have a wireless device, the transmission will be sent automatically. After your physician reviews your transmission, you will receive a postcard with your next transmission date.

## 2013-05-06 ENCOUNTER — Encounter: Payer: Self-pay | Admitting: Internal Medicine

## 2013-05-06 DIAGNOSIS — I495 Sick sinus syndrome: Secondary | ICD-10-CM

## 2013-08-05 ENCOUNTER — Encounter: Payer: Self-pay | Admitting: Internal Medicine

## 2013-08-05 DIAGNOSIS — I495 Sick sinus syndrome: Secondary | ICD-10-CM

## 2013-09-29 ENCOUNTER — Encounter: Payer: Self-pay | Admitting: Internal Medicine

## 2013-11-01 ENCOUNTER — Telehealth: Payer: Self-pay | Admitting: Internal Medicine

## 2013-11-01 ENCOUNTER — Ambulatory Visit (INDEPENDENT_AMBULATORY_CARE_PROVIDER_SITE_OTHER): Payer: Medicare Other | Admitting: Nurse Practitioner

## 2013-11-01 ENCOUNTER — Encounter: Payer: Self-pay | Admitting: Nurse Practitioner

## 2013-11-01 ENCOUNTER — Other Ambulatory Visit: Payer: Self-pay | Admitting: Nurse Practitioner

## 2013-11-01 VITALS — BP 132/78 | HR 112 | Ht 64.0 in | Wt 120.8 lb

## 2013-11-01 DIAGNOSIS — I4891 Unspecified atrial fibrillation: Secondary | ICD-10-CM

## 2013-11-01 LAB — APTT: aPTT: 44.6 s — ABNORMAL HIGH (ref 21.7–28.8)

## 2013-11-01 LAB — CBC WITH DIFFERENTIAL/PLATELET
Basophils Absolute: 0 10*3/uL (ref 0.0–0.1)
Basophils Relative: 0.1 % (ref 0.0–3.0)
Eosinophils Absolute: 0 10*3/uL (ref 0.0–0.7)
Eosinophils Relative: 0.4 % (ref 0.0–5.0)
HCT: 32.8 % — ABNORMAL LOW (ref 36.0–46.0)
Hemoglobin: 11 g/dL — ABNORMAL LOW (ref 12.0–15.0)
Lymphocytes Relative: 6.3 % — ABNORMAL LOW (ref 12.0–46.0)
Lymphs Abs: 0.7 10*3/uL (ref 0.7–4.0)
MCHC: 33.4 g/dL (ref 30.0–36.0)
MCV: 98.4 fl (ref 78.0–100.0)
Monocytes Absolute: 0.6 10*3/uL (ref 0.1–1.0)
Monocytes Relative: 5.3 % (ref 3.0–12.0)
Neutro Abs: 9.2 10*3/uL — ABNORMAL HIGH (ref 1.4–7.7)
Neutrophils Relative %: 87.9 % — ABNORMAL HIGH (ref 43.0–77.0)
Platelets: 234 10*3/uL (ref 150.0–400.0)
RBC: 3.34 Mil/uL — ABNORMAL LOW (ref 3.87–5.11)
RDW: 15.2 % — ABNORMAL HIGH (ref 11.5–14.6)
WBC: 10.5 10*3/uL (ref 4.5–10.5)

## 2013-11-01 LAB — BASIC METABOLIC PANEL
BUN: 48 mg/dL — ABNORMAL HIGH (ref 6–23)
CO2: 18 mEq/L — ABNORMAL LOW (ref 19–32)
Calcium: 9.1 mg/dL (ref 8.4–10.5)
Chloride: 112 mEq/L (ref 96–112)
Creatinine, Ser: 1.8 mg/dL — ABNORMAL HIGH (ref 0.4–1.2)
GFR: 27.57 mL/min — ABNORMAL LOW (ref >60.00)
Glucose, Bld: 165 mg/dL — ABNORMAL HIGH (ref 70–99)
Potassium: 5 mEq/L (ref 3.5–5.1)
Sodium: 140 mEq/L (ref 135–145)

## 2013-11-01 LAB — TSH: TSH: 1.19 u[IU]/mL (ref 0.35–5.50)

## 2013-11-01 LAB — PROTIME-INR
INR: 1.7 ratio — ABNORMAL HIGH (ref 0.8–1.0)
Prothrombin Time: 17.6 s — ABNORMAL HIGH (ref 10.2–12.4)

## 2013-11-01 MED ORDER — METOPROLOL TARTRATE 50 MG PO TABS: 25.0000 mg | ORAL_TABLET | Freq: Three times a day (TID) | ORAL | Status: DC

## 2013-11-01 NOTE — Telephone Encounter (Signed)
New problem    Pt called say she is having Cp and heart is racing. Would like to see Dr Graciela Husbands.

## 2013-11-01 NOTE — Telephone Encounter (Signed)
Received call from patient she stated she has been having fast heart beat with the slightest exertion for the past 2 weeks.No chest pain.Stated this past Saturday she felt shaky all over.Appointment scheduled with Norma Fredrickson NP this morning at 11:30.

## 2013-11-01 NOTE — Patient Instructions (Addendum)
Increase your Lopressor to 3 times a day  We are going to arrange for a cardioversion - this week at [facility] are scheduled for a cardioversion on Wednesday, December 31st at 9am with Dr. Shirlee Latch or associates. Please go to [facility] 2nd Floor Short Stay at 7:30 am on Wednesday, December 31st .  Enter through the [employer] A Do not have any food or drink after midnight on Tuesday.  You may take your medicines with a sip of water on the day of your procedure.  You will need someone to drive you home following your procedure.     Electrical Cardioversion Cardioversion is the delivery of a jolt of electricity to change the rhythm of the heart. Sticky patches or metal paddles are placed on the chest to deliver the electricity from a device. This is done to restore a normal rhythm. A rhythm that is too fast or not regular keeps the heart from pumping well. Compared to medicines used to change an abnormal rhythm (arrhythmia), cardioversion is faster and works better.  WHEN WOULD THIS BE DONE?  In an emergency:  There is low or no blood pressure as a result of the heart rhythm.  Normal rhythm must be restored as fast as possible to protect the brain and heart from further damage.  It may save a life.  For less serious heart rhythms, such as atrial fibrillation or flutter, in which:  The heart is beating too fast or is not regular.  The heart is still able to pump enough blood, but not as well as it should.  Medicine to change the rhythm has not worked.  It is safe to wait in order to allow time for preparation. LET YOUR CAREGIVER KNOW ABOUT:   Medicines taken, including herbs, eyedrops, over-the-counter medicines, and creams.  History of abnormal heartbeats   RISKS AND COMPLICATIONS  Clots may form in your heart if you have an arrhythmia. In rare cases, cardioversion can cause a clot to break free and travel to other parts of the body. This could cause a stroke or  other problems.  BEFORE THE PROCEDURE   You may have tests to detect blood clots in your heart and evaluate heart function.  You may start taking blood thinners (anticoagulants) so your blood does not clot as easily.  Other medicines may be given to help your heart work better. PROCEDURE (SCHEDULED)  You will be given medicine through an intravenous (IV) access to reduce discomfort and make you sleepy (sedative).  Your whole body may move when the shock is delivered. Your chest may feel sore.  You may be able to go home after a few hours. Your heart rhythm will be watched to make sure it does not change. HOME CARE INSTRUCTIONS   Only take medicine as directed by your caregiver. Be sure you understand how and when to take your medicine.  Learn how to feel your pulse and check it often.  Limit your activity for 48 hours.  Avoid caffeine and other stimulants as directed. SEEK MEDICAL CARE IF:   You feel like your heart is beating too fast or your pulse is not regular.  You have any questions about your medicines.  You have bleeding that will not stop. SEEK IMMEDIATE MEDICAL CARE IF:   You are dizzy or feel faint.  It is hard to breathe or you feel short of breath.  There is a change in discomfort in your chest.  Your speech is  slurred or you have trouble moving your arm or leg on one side.  You get a muscle cramp.  Your fingers or toes turn cold or blue. MAKE SURE YOU:   Understand these instructions.  Will watch your condition.  Will get help right away if you are not doing well or get worse. Document Released: 10/11/2002 Document Revised: 02/15/2013 Document Reviewed: 05/05/2013 [facility] Patient Information 2014 [place], [place].

## 2013-11-01 NOTE — Progress Notes (Signed)
Sophia Oliver Date of Birth: 1923/11/30 Medical Record #[id number]  History of Present Illness: Sophia Oliver is seen today for a work in visit. Seen for Dr. Graciela Husbands. She is 79 years of age. She has a history of syncope with sinus node dysfunction and has PPM in place. Other issues include wide complex tachycardia, DJD, anxiety, CKD, remote breast cancer, and OA. Apparently has had some issues with her V lead with increasing impedances and thresholds - with modest amount of pacing in the ventricle.   Last seen here back in March - seemed to be doing ok. Looks like she has had PAF. Has been maintained on chronic Pradaxa.   Comes in today. Here with her husband. She is quite upset - has not felt well for the past 2 weeks. Feels quite shaky, her heart has been racing. Little dizzy. No chest pain. Notes increased fatigue and "just can't do anything". Has remained on her Pradaxa and has missed NO doses.    Current Outpatient Prescriptions  Medication Sig Dispense Refill  . acetaminophen (TYLENOL) 325 MG tablet Take 325 mg by mouth every 6 (six) hours as needed for pain.      [name] ALPRAZolam (XANAX) 0.25 MG tablet Take 1 tablet (0.25 mg total) by mouth 2 (two) times daily.  20 tablet  0  . B Complex-C (B-COMPLEX WITH VITAMIN C) tablet Take 1 tablet by mouth daily.      . cholecalciferol (VITAMIN D) 1000 UNITS tablet Take 1,000 Units by mouth daily.      [name] CINNAMON PO Take 1,000 mg by mouth.       . Cranberry-Vitamin C-Vitamin E (CRANBERRY PLUS VITAMIN C) 4200-20-3 MG-MG-UNIT CAPS Take 4,200 mg by mouth 2 (two) times daily.      . dabigatran (PRADAXA) 75 MG CAPS Take 75 mg by mouth 2 (two) times daily.   60 capsule    . diclofenac sodium (VOLTAREN) 1 % GEL Apply 2 g topically 4 (four) times daily.  1 Tube  0  . fish oil-omega-3 fatty acids 1000 MG capsule Take 1 g by mouth daily.      . metoprolol (LOPRESSOR) 50 MG tablet Take 25 mg by mouth 2 (two) times daily.       . pantoprazole (PROTONIX) 40 MG tablet  Take 40 mg by mouth daily.      [name] spironolactone (ALDACTONE) 25 MG tablet Take 25 mg by mouth daily.      . digoxin (LANOXIN) 0.125 MG tablet Take 1 tablet (0.125 mg total) by mouth daily.  30 tablet  0   No current facility-administered medications for this visit.    Allergies  Allergen Reactions  . Codeine Nausea And Vomiting  . Other Nausea And Vomiting    Narcotics  dizziness    Past Medical History  Diagnosis Date  . Syncope   . Sinus node dysfunction   . S/P cardiac pacemaker procedure     [employer]  . Wide-complex tachycardia   . Cholelithiasis   . Glucose intolerance (impaired glucose tolerance)   . DJD (degenerative joint disease)   . Anxiety   . PONV (postoperative nausea and vomiting)   . Pacemaker   . Chronic kidney disease     frequency  . Breast cancer     left breast - hx of 2003   . Injury of right upper arm     after venipuncture in 3/13 - pt describes pain afterwards- no followup done seen by PCP- no further treatment  Past Surgical History  Procedure Laterality Date  . S/p pacemaker    . Replacement total knee bilateral    . Total shoulder replacement    . Total hip arthroplasty    . Femur fracture surgery    . Hysterectomy, unspecified area    . Masterectomy    . Abdominal hysterectomy    . Breast surgery      left breast - 2003   . Total hip arthroplasty  06/16/2012    Procedure: TOTAL HIP ARTHROPLASTY;  Surgeon: [name], MD;  Location: [facility];  Service: Orthopedics;  Laterality: Left;  . Hip closed reduction  06/30/2012    Procedure: CLOSED MANIPULATION HIP;  Surgeon: [name], MD;  Location: [facility];  Service: Orthopedics;  Laterality: Left;    History  Smoking status  . Former Smoker  . Types: Cigarettes  . Quit date: 11/04/1948  Smokeless tobacco  . Never Used    History  Alcohol Use No    No family history on file.  Review of Systems: The review of systems is per the HPI.  All other systems were reviewed and  are negative.  Physical Exam: BP 132/78  Pulse 112  Ht 5\' 4"  (1.626 m)  Wt 120 lb 12.8 oz (54.795 kg)  BMI 20.73 kg/m2 Patient is very pleasant and in no acute distress. Pretty tearful/anxious. Skin is warm and dry. Color is normal.  HEENT is unremarkable. Normocephalic/atraumatic. PERRL. Sclera are nonicteric. Neck is supple. No masses. No JVD. Lungs are clear. Cardiac exam shows an irregular rate and rhythm. Abdomen is soft. Extremities are without edema. Gait and ROM are intact. No gross neurologic deficits noted.  LABORATORY DATA: PENDING  EKG today shows atrial fib with RVR - rate of 112 noted. She has a LBBB.    Chemistry      Component Value Date/Time   NA 135 11/25/2012 0448   K 5.6* 11/25/2012 0448   CL 97 11/25/2012 0448   CO2 28 11/25/2012 0448   BUN 41* 11/25/2012 0448   CREATININE 1.48* 11/25/2012 0448      Component Value Date/Time   CALCIUM 9.2 11/25/2012 0448   ALKPHOS 99 11/20/2012 0434   AST 12 11/20/2012 0434   ALT 6 11/20/2012 0434   BILITOT 0.3 11/20/2012 0434     Lab Results  Component Value Date   WBC 8.7 11/23/2012   HGB 10.4* 11/23/2012   HCT 32.2* 11/23/2012   MCV 96.1 11/23/2012   PLT 248 11/23/2012   Echo Study Conclusions from January 2014  - Left ventricle: The cavity size was normal. Systolic function was mildly to moderately reduced. The estimated ejection fraction was in the range of 40% to 45%. Regional wall motion abnormalities cannot be excluded. Doppler parameters are consistent with abnormal left ventricular relaxation (grade 1 diastolic dysfunction). - Ventricular septum: Septal motion showed paradox. - Mitral valve: Calcified annulus. Mild regurgitation. - Atrial septum: No defect or patent foramen ovale was identified. - Pulmonary arteries: Systolic pressure was moderately increased. PA peak pressure: 44mm Hg (S).   Assessment / Plan: 1. PAF - in looking back at old EKGs - she has had periods of sinus - her pacemaker was checked today  - looks like this started back on December 18th.   2. PPM - has chronic elevated impedance & thresholds on her V lead - currently only using 1% of the time. Will cautiously increase her BB at this time.  3. Chronic anticoagulation with Pradaxa  4. Mild LV dysfunction  I have spoken to Dr. Graciela Husbands - we have decided to increase her Lopressor to 25 mg TID. Arrange for cardioversion this week - scheduled for Wednesday with Dr. Shirlee Latch. She is to remain on her Pradaxa. Will arrange for the PPM rep to be present as well Barbara Cower with [employer] is aware of her procedure time/date).   Patient is agreeable to this plan and will call if any problems develop in the interim.   Rosalio Macadamia, RN, ANP-C [facility] [address] [place], [place]  [address]

## 2013-11-03 ENCOUNTER — Ambulatory Visit (HOSPITAL_COMMUNITY)
Admission: RE | Admit: 2013-11-03 | Discharge: 2013-11-03 | Disposition: A | Discharge status: Home / Self Care | Payer: Medicare Other | Source: Ambulatory Visit | Attending: Cardiology | Admitting: Cardiology

## 2013-11-03 ENCOUNTER — Encounter (HOSPITAL_COMMUNITY): Payer: Medicare Other | Admitting: Anesthesiology

## 2013-11-03 ENCOUNTER — Telehealth: Payer: Self-pay | Admitting: Internal Medicine

## 2013-11-03 ENCOUNTER — Encounter (HOSPITAL_COMMUNITY)
Admission: RE | Disposition: A | Discharge status: Home / Self Care | Payer: Self-pay | Source: Ambulatory Visit | Attending: Cardiology

## 2013-11-03 ENCOUNTER — Ambulatory Visit (HOSPITAL_COMMUNITY): Payer: Medicare Other | Admitting: Anesthesiology

## 2013-11-03 ENCOUNTER — Encounter (HOSPITAL_COMMUNITY): Payer: Self-pay | Admitting: Anesthesiology

## 2013-11-03 DIAGNOSIS — Z950 Presence of cardiac pacemaker: Secondary | ICD-10-CM | POA: Insufficient documentation

## 2013-11-03 DIAGNOSIS — I4891 Unspecified atrial fibrillation: Secondary | ICD-10-CM | POA: Insufficient documentation

## 2013-11-03 DIAGNOSIS — Z538 Procedure and treatment not carried out for other reasons: Secondary | ICD-10-CM | POA: Insufficient documentation

## 2013-11-03 DIAGNOSIS — F411 Generalized anxiety disorder: Secondary | ICD-10-CM | POA: Insufficient documentation

## 2013-11-03 SURGERY — CANCELLED PROCEDURE
Anesthesia: Monitor Anesthesia Care

## 2013-11-03 MED ORDER — DEXTROSE-NACL 5-0.45 % IV SOLN: INTRAVENOUS | Status: DC

## 2013-11-03 MED ORDER — DEXTROSE-NACL 5-0.45 % IV SOLN
INTRAVENOUS | Status: DC
  Administered 2013-11-03: 500 mL via INTRAVENOUS

## 2013-11-03 MED ORDER — METOPROLOL TARTRATE 50 MG PO TABS: 25.0000 mg | ORAL_TABLET | Freq: Four times a day (QID) | ORAL | Status: DC

## 2013-11-03 NOTE — Anesthesia Postprocedure Evaluation (Signed)
Cancelled procedure.

## 2013-11-03 NOTE — Telephone Encounter (Signed)
Called stating she saw Dr. Graciela Husbands in hospital today.  Was scheduled for a procedure but he decided not to do the procedure.  He changed her Metoprolol 25 mg to 4 x a day and wanted to make sure that was noted in her chart.  Will make the adjustment.

## 2013-11-03 NOTE — Preoperative (Signed)
Beta Blockers   Reason not to administer Beta Blockers:Not Applicable 

## 2013-11-03 NOTE — Anesthesia Preprocedure Evaluation (Addendum)
Anesthesia Evaluation  Patient identified by MRN, date of birth, ID band Patient awake, Patient confused and Patient unresponsive    Reviewed: Allergy & Precautions, H&P , NPO status , Patient's Chart, lab work & pertinent test results, reviewed documented beta blocker date and time   History of Anesthesia Complications (+) PONV  Airway Mallampati: II TM Distance: >3 FB Neck ROM: Full    Dental  (+) Teeth Intact   Pulmonary former smoker,    Pulmonary exam normal       Cardiovascular + dysrhythmias Atrial Fibrillation + pacemaker Rhythm:Irregular     Neuro/Psych Anxiety    GI/Hepatic   Endo/Other    Renal/GU Renal disease     Musculoskeletal   Abdominal Normal abdominal exam  (+)   Peds  Hematology   Anesthesia Other Findings EF 40-45%  Reproductive/Obstetrics                         Anesthesia Physical Anesthesia Plan  ASA: III  Anesthesia Plan: MAC   Post-op Pain Management:    Induction: Intravenous  Airway Management Planned: Mask  Additional Equipment:   Intra-op Plan:   Post-operative Plan:   Informed Consent: I have reviewed the patients History and Physical, chart, labs and discussed the procedure including the risks, benefits and alternatives for the proposed anesthesia with the patient or authorized representative who has indicated his/her understanding and acceptance.   Dental advisory given  Plan Discussed with: CRNA, Surgeon and Anesthesiologist  Anesthesia Plan Comments:         Anesthesia Quick Evaluation

## 2013-11-03 NOTE — Telephone Encounter (Signed)
New problem    Pt sated she was told dy Dr Graciela Husbands to call about her medication, pt called to say she takes  METOPROLOL 25mg  4x daily and it should reflect that in her records.

## 2013-11-03 NOTE — Transfer of Care (Signed)
Immediate Anesthesia Transfer of Care Note  Cancelled procedure

## 2013-11-04 ENCOUNTER — Encounter (HOSPITAL_COMMUNITY): Payer: Self-pay | Admitting: Emergency Medicine

## 2013-11-04 ENCOUNTER — Emergency Department (HOSPITAL_COMMUNITY)
Admission: EM | Admit: 2013-11-04 | Discharge: 2013-11-04 | Disposition: A | Discharge status: Home / Self Care | Payer: Medicare Other | Attending: Emergency Medicine | Admitting: Emergency Medicine

## 2013-11-04 DIAGNOSIS — Z9089 Acquired absence of other organs: Secondary | ICD-10-CM | POA: Insufficient documentation

## 2013-11-04 DIAGNOSIS — Z950 Presence of cardiac pacemaker: Secondary | ICD-10-CM | POA: Insufficient documentation

## 2013-11-04 DIAGNOSIS — Z791 Long term (current) use of non-steroidal anti-inflammatories (NSAID): Secondary | ICD-10-CM | POA: Insufficient documentation

## 2013-11-04 DIAGNOSIS — Z79899 Other long term (current) drug therapy: Secondary | ICD-10-CM | POA: Insufficient documentation

## 2013-11-04 DIAGNOSIS — Z853 Personal history of malignant neoplasm of breast: Secondary | ICD-10-CM | POA: Insufficient documentation

## 2013-11-04 DIAGNOSIS — Z87828 Personal history of other (healed) physical injury and trauma: Secondary | ICD-10-CM | POA: Insufficient documentation

## 2013-11-04 DIAGNOSIS — F411 Generalized anxiety disorder: Secondary | ICD-10-CM | POA: Insufficient documentation

## 2013-11-04 DIAGNOSIS — N189 Chronic kidney disease, unspecified: Secondary | ICD-10-CM | POA: Insufficient documentation

## 2013-11-04 DIAGNOSIS — I4891 Unspecified atrial fibrillation: Secondary | ICD-10-CM | POA: Insufficient documentation

## 2013-11-04 DIAGNOSIS — I480 Paroxysmal atrial fibrillation: Secondary | ICD-10-CM

## 2013-11-04 DIAGNOSIS — Z8739 Personal history of other diseases of the musculoskeletal system and connective tissue: Secondary | ICD-10-CM | POA: Insufficient documentation

## 2013-11-04 DIAGNOSIS — Z87891 Personal history of nicotine dependence: Secondary | ICD-10-CM | POA: Insufficient documentation

## 2013-11-04 DIAGNOSIS — Z8719 Personal history of other diseases of the digestive system: Secondary | ICD-10-CM | POA: Insufficient documentation

## 2013-11-04 NOTE — ED Notes (Signed)
Pt here with c/o sob and afib , pt is not c/o chest pain ,

## 2013-11-04 NOTE — Discharge Instructions (Signed)
Atrial Fibrillation Atrial fibrillation is a condition that causes your heart to beat irregularly. It may also cause your heart to beat faster than normal. Atrial fibrillation can prevent your heart from pumping blood normally. It increases your risk of stroke and heart problems. HOME CARE  Take medications as told by your doctor.  Only take medications that your doctor says are safe. Some medications can make the condition worse or happen again.  If blood thinners were prescribed by your doctor, take them exactly as told. Too much can cause bleeding. Too little and you will not have the needed protection against stroke and other problems.  Perform blood tests at home if told by your doctor.  Perform blood tests exactly as told by your doctor.  Do not drink alcohol.  Do not drink beverages with caffeine such as coffee, soda, and some teas.  Maintain a healthy weight.  Do not use diet pills unless your doctor says they are safe. They may make heart problems worse.  Follow diet instructions as told by your doctor.  Exercise regularly as told by your doctor.  Keep all follow-up appointments. GET HELP RIGHT AWAY IF:   You have chest or belly (abdominal) pain.  You feel sick to your stomach (nauseous)  You suddenly have swollen feet and ankles.  You feel dizzy.  You face, arms, or legs feel numb or weak.  There is a change in your vision or speech.  You notice a change in the speed, rhythm, or strength of your heartbeat.  You suddenly begin peeing (urinating) more often.  You get tired more easily when moving or exercising. MAKE SURE YOU:   Understand these instructions.  Will watch your condition.  Will get help right away if you are not doing well or get worse. Document Released: 07/30/2008 Document Revised: 02/15/2013 Document Reviewed: 12/01/2012 [facility] Patient Information 2014 [place]. Your caregiver has seen you today because you are having problems  with feelings of weakness, dizziness, and/or fatigue. Weakness has many different causes, some of which are common and others are very rare. Your caregiver has considered some of the most common causes of weakness and feels it is safe for you to go home and be observed. Not every illness or injury can be identified during an emergency department visit, thus follow-up with your primary healthcare provider is important. Medical conditions can also worsen, so it is also important to return immediately as directed below, or if you have other serious concerns develop. RETURN IMMEDIATELY IF you develop new shortness of breath, chest pain, fever, have difficulty moving parts of your body (new weakness, numbness, or incoordination), sudden change in speech, vision, swallowing, or understanding, faint or develop new dizziness, severe headache, become poorly responsive or have an altered mental status compared to baseline for you, new rash, abdominal pain, or bloody stools,  Return sooner also if you develop new problems for which you have not talked to your caregiver but you feel may be emergency medical conditions, or are unable to be cared for safely at home.

## 2013-11-04 NOTE — ED Provider Notes (Signed)
CSN: [id number]     Arrival date & time 11/04/13  1154 History   First MD Initiated Contact with Patient 11/04/13 1200     Chief Complaint  Patient presents with  . Shortness of Breath  . Atrial Fibrillation   (Consider location/radiation/quality/duration/timing/severity/associated sxs/prior Treatment) HPI Generally anxious shaky and palpitations for a couple weeks diagnosed with atrial fibrillation by cardiology this week cardioversion planned for yesterday was canceled and patient's beta blocker dose increased patient's symptoms continues or return to the ED today patient denies shortness breath to me denies chest pain denies strokelike symptoms denies syncope.   Past Medical History  Diagnosis Date  . Syncope   . Sinus node dysfunction   . S/P cardiac pacemaker procedure     Guidant  . Wide-complex tachycardia   . Cholelithiasis   . Glucose intolerance (impaired glucose tolerance)   . DJD (degenerative joint disease)   . Anxiety   . PONV (postoperative nausea and vomiting)   . Pacemaker   . Chronic kidney disease     frequency  . Breast cancer     left breast - hx of 2003   . Injury of right upper arm     after venipuncture in 3/13 - pt describes pain afterwards- no followup done seen by PCP- no further treatment   . PAF (paroxysmal atrial fibrillation)   . Left bundle branch block    Past Surgical History  Procedure Laterality Date  . S/p pacemaker    . Replacement total knee bilateral    . Total shoulder replacement    . Total hip arthroplasty    . Femur fracture surgery    . Hysterectomy, unspecified area    . Masterectomy    . Abdominal hysterectomy    . Breast surgery      left breast - 2003   . Total hip arthroplasty  06/16/2012    Procedure: TOTAL HIP ARTHROPLASTY;  Surgeon: Mauri Pole, MD;  Location: [facility];  Service: Orthopedics;  Laterality: Left;  . Hip closed reduction  06/30/2012    Procedure: CLOSED MANIPULATION HIP;  Surgeon: Mauri Pole, MD;   Location: [facility];  Service: Orthopedics;  Laterality: Left;   History reviewed. No pertinent family history. History  Substance Use Topics  . Smoking status: Former Smoker    Types: Cigarettes    Quit date: 11/04/1948  . Smokeless tobacco: Never Used  . Alcohol Use: No   OB History   Grav Para Term Preterm Abortions TAB SAB Ect Mult Living                 Review of Systems 10 Systems reviewed and are negative for acute change except as noted in the HPI. Allergies  Codeine and Other  Home Medications   Current Outpatient Rx  Name  Route  Sig  Dispense  Refill  . acetaminophen (TYLENOL) 325 MG tablet   Oral   Take 325 mg by mouth every 6 (six) hours as needed for pain.         [name] ALPRAZolam (XANAX) 0.25 MG tablet   Oral   Take 1 tablet (0.25 mg total) by mouth 2 (two) times daily.   20 tablet   0   . B Complex-C (B-COMPLEX WITH VITAMIN C) tablet   Oral   Take 1 tablet by mouth daily.         . cholecalciferol (VITAMIN D) 1000 UNITS tablet   Oral   Take 1,000 Units by mouth  daily.         [name] CINNAMON PO   Oral   Take 1,000 mg by mouth.          . Cranberry-Vitamin C-Vitamin E (CRANBERRY PLUS VITAMIN C) 4200-20-3 MG-MG-UNIT CAPS   Oral   Take 4,200 mg by mouth 2 (two) times daily.         . dabigatran (PRADAXA) 75 MG CAPS   Oral   Take 75 mg by mouth 2 (two) times daily.    60 capsule      . diclofenac sodium (VOLTAREN) 1 % GEL   Topical   Apply 2 g topically 4 (four) times daily.   1 Tube   0   . EXPIRED: digoxin (LANOXIN) 0.125 MG tablet   Oral   Take 1 tablet (0.125 mg total) by mouth daily.   30 tablet   0     To be followed up with Dr. Terrence Dupont   . fish oil-omega-3 fatty acids 1000 MG capsule   Oral   Take 1 g by mouth daily.         . metoprolol (LOPRESSOR) 50 MG tablet   Oral   Take 0.5 tablets (25 mg total) by mouth 4 (four) times daily.         . pantoprazole (PROTONIX) 40 MG tablet   Oral   Take 40 mg by mouth daily.          [name] spironolactone (ALDACTONE) 25 MG tablet   Oral   Take 25 mg by mouth daily.          There were no vitals taken for this visit. Physical Exam  Nursing note and vitals reviewed. Constitutional:  Awake, alert, nontoxic appearance.  HENT:  Head: Atraumatic.  Eyes: Right eye exhibits no discharge. Left eye exhibits no discharge.  Neck: Neck supple.  Cardiovascular: Normal rate.   No murmur heard. cardiac irregularly irregular rate controlled 80-100 A. fib   Pulmonary/Chest: Effort normal and breath sounds normal. No respiratory distress. She has no wheezes. She has no rales. She exhibits no tenderness.  Lungs clear pulse oximetry normal room air 98%   Abdominal: Soft. There is no tenderness. There is no rebound.  Musculoskeletal: She exhibits no edema and no tenderness.  Baseline ROM, no obvious new focal weakness.  Neurological: She is alert.  Mental status and motor strength appears baseline for patient and situation.  Skin: No rash noted.  Psychiatric: She has a normal mood and affect.    ED Course  Procedures (including critical care time) D/w Cards rec discharge. Patient / Family / Caregiver informed of clinical course, understand medical decision-making process, and agree with plan. Labs Review Labs Reviewed - No data to display Imaging Review No results found.  EKG Interpretation    Date/Time:  Thursday November 04 2013 12:07:06 EST Ventricular Rate:  102 PR Interval:    QRS Duration: 146 QT Interval:  413 QTC Calculation: 538 R Axis:   66 Text Interpretation:  Age not entered, assumed to be  80 years old for purpose of ECG interpretation Atrial fibrillation Multiple ventricular premature complexes IVCD, consider atypical LBBB Baseline wander in lead(s) V3 V5 No significant change since last tracing Confirmed by [facility]  MD, Jadon Ressler ([id number]) on 11/04/2013 12:26:07 PM            MDM   1. PAF (paroxysmal atrial fibrillation)    I doubt any other EMC  precluding discharge at this time including, but not necessarily limited  to the following:ACS, HF, uncontrolled rate.    Babette Relic, MD 11/05/13 [phone]

## 2013-11-05 ENCOUNTER — Telehealth: Payer: Self-pay | Admitting: Internal Medicine

## 2013-11-05 NOTE — Telephone Encounter (Signed)
New message    Pt was seen at the er 11/04/13 and needs an er f/u next week. There are no openings I offered her 1/13 but pt insists on next week. Can you help

## 2013-11-05 NOTE — Telephone Encounter (Signed)
Advised pt that Caryl Comes could not see her next week, but could schedule her with PA/NP. Pt will keep currently scheduled appts.  Pt scheduled to see Ileene Hutchinson, [facility] 11/16/13 at 3pm. Scheduled to see Caryl Comes 11/25/13. Pt agreeable to keep these OV.

## 2013-11-16 ENCOUNTER — Encounter: Payer: Self-pay | Admitting: Cardiology

## 2013-11-16 ENCOUNTER — Ambulatory Visit (INDEPENDENT_AMBULATORY_CARE_PROVIDER_SITE_OTHER): Payer: Medicare Other | Admitting: Cardiology

## 2013-11-16 VITALS — BP 138/70 | HR 76 | Ht 61.0 in | Wt 120.0 lb

## 2013-11-16 DIAGNOSIS — T82110A Breakdown (mechanical) of cardiac electrode, initial encounter: Secondary | ICD-10-CM

## 2013-11-16 DIAGNOSIS — I519 Heart disease, unspecified: Secondary | ICD-10-CM

## 2013-11-16 DIAGNOSIS — I4891 Unspecified atrial fibrillation: Secondary | ICD-10-CM

## 2013-11-16 DIAGNOSIS — I495 Sick sinus syndrome: Secondary | ICD-10-CM

## 2013-11-16 DIAGNOSIS — I498 Other specified cardiac arrhythmias: Secondary | ICD-10-CM

## 2013-11-16 DIAGNOSIS — I428 Other cardiomyopathies: Secondary | ICD-10-CM

## 2013-11-16 DIAGNOSIS — Z950 Presence of cardiac pacemaker: Secondary | ICD-10-CM

## 2013-11-16 DIAGNOSIS — I471 Supraventricular tachycardia: Secondary | ICD-10-CM

## 2013-11-16 DIAGNOSIS — I429 Cardiomyopathy, unspecified: Secondary | ICD-10-CM

## 2013-11-16 MED ORDER — AMIODARONE HCL 200 MG PO TABS: 200.0000 mg | ORAL_TABLET | Freq: Two times a day (BID) | ORAL | Status: DC

## 2013-11-16 NOTE — Patient Instructions (Addendum)
Your physician has recommended you make the following change in your medication:   1. Start Amiodarone 200 mg 1 tab twice daily  2. Stop Digoxin   Your physician recommends that you schedule a follow-up appointment in:2 weeks with Murlean Hark only when Dr. Caryl Comes is in the office      Your physician has requested that you have an echocardiogram. Echocardiography is a painless test that uses sound waves to create images of your heart. It provides your doctor with information about the size and shape of your heart and how well your heart's chambers and valves are working. This procedure takes approximately one hour. There are no restrictions for this procedure.

## 2013-11-17 NOTE — Progress Notes (Signed)
Patient ID: Sophia Oliver MRN: [id number], DOB/AGE: 78-May-1925   Date of Visit: 11/16/2013  Primary Physician: [name], MD Primary Cardiologist: Jolyn Nap, MD Reason for Visit: Atrial fibrillation  History of Present Illness  Sophia Oliver is a 80 y.o. female with sinus node dysfunction s/p PPM implant, chronically elevated RV lead impedance, PAF, PAT, LBBB and CKD who presents today for EP followup.   She was scheduled for cardioversion a couple of weeks ago but when she presented in an atrial tachycardia and the cardioversion was cancelled. Metoprolol increased.   Since that time, she reports she is doing "terrible." She is frustrated. She states she has no energy. She also reports dyspnea with mild exertion such as household chores and "racing" palpitations. She denies chest pain. She denies dizziness, near syncope or syncope. She has noticed intermitten LE swelling. She denies orthopnea or PND. She is compliant with medications.  Past Medical History Past Medical History  Diagnosis Date  . Syncope   . Sinus node dysfunction   . S/P cardiac pacemaker procedure     Guidant  . Wide-complex tachycardia   . Cholelithiasis   . Glucose intolerance (impaired glucose tolerance)   . DJD (degenerative joint disease)   . Anxiety   . PONV (postoperative nausea and vomiting)   . Pacemaker   . Chronic kidney disease     frequency  . Breast cancer     left breast - hx of 2003   . Injury of right upper arm     after venipuncture in 3/13 - pt describes pain afterwards- no followup done seen by PCP- no further treatment   . PAF (paroxysmal atrial fibrillation)   . Left bundle branch block     Past Surgical History Past Surgical History  Procedure Laterality Date  . S/p pacemaker    . Replacement total knee bilateral    . Total shoulder replacement    . Total hip arthroplasty    . Femur fracture surgery    . Hysterectomy, unspecified area    . Masterectomy    . Abdominal  hysterectomy    . Breast surgery      left breast - 2003   . Total hip arthroplasty  06/16/2012    Procedure: TOTAL HIP ARTHROPLASTY;  Surgeon: Mauri Pole, MD;  Location: [facility];  Service: Orthopedics;  Laterality: Left;  . Hip closed reduction  06/30/2012    Procedure: CLOSED MANIPULATION HIP;  Surgeon: Mauri Pole, MD;  Location: [facility];  Service: Orthopedics;  Laterality: Left;    Allergies/Intolerances Allergies  Allergen Reactions  . Codeine Nausea And Vomiting  . Other Nausea And Vomiting    Narcotics  dizziness    Current Home Medications Current Outpatient Prescriptions  Medication Sig Dispense Refill  . acetaminophen (TYLENOL) 325 MG tablet Take 325 mg by mouth every 6 (six) hours as needed for pain.      [name] ALPRAZolam (XANAX) 0.25 MG tablet Take 1 tablet (0.25 mg total) by mouth 2 (two) times daily.  20 tablet  0  . B Complex-C (B-COMPLEX WITH VITAMIN C) tablet Take 1 tablet by mouth daily.      . cholecalciferol (VITAMIN D) 1000 UNITS tablet Take 1,000 Units by mouth daily.      [name] CINNAMON PO Take 1,000 mg by mouth.       . Cranberry-Vitamin C-Vitamin E (CRANBERRY PLUS VITAMIN C) 4200-20-3 MG-MG-UNIT CAPS Take 4,200 mg by mouth 2 (two) times daily.      [name]  Cyanocobalamin (VITAMIN B 12 PO) Take by mouth.      . dabigatran (PRADAXA) 75 MG CAPS Take 75 mg by mouth 2 (two) times daily.   60 capsule    . diclofenac sodium (VOLTAREN) 1 % GEL Apply 2 g topically 4 (four) times daily.  1 Tube  0  . fish oil-omega-3 fatty acids 1000 MG capsule Take 1 g by mouth daily.      . metoprolol (LOPRESSOR) 50 MG tablet Take 0.5 tablets (25 mg total) by mouth 4 (four) times daily.      . pantoprazole (PROTONIX) 40 MG tablet Take 40 mg by mouth daily.      [name] spironolactone (ALDACTONE) 25 MG tablet Take 25 mg by mouth daily.      [name] amiodarone (PACERONE) 200 MG tablet Take 1 tablet (200 mg total) by mouth 2 (two) times daily.  180 tablet  2   No current facility-administered medications  for this visit.    Social History History   Social History  . Marital Status: Married    Spouse Name: N/A    Number of Children: N/A  . Years of Education: N/A   Occupational History  . Not on file.   Social History Main Topics  . Smoking status: Former Smoker    Types: Cigarettes    Quit date: 11/04/1948  . Smokeless tobacco: Never Used  . Alcohol Use: No  . Drug Use: No  . Sexual Activity: No   Other Topics Concern  . Not on file   Social History Narrative   Married, retired.      Review of Systems General: No chills, fever, night sweats or weight changes Cardiovascular: No chest pain, dyspnea on exertion, edema, orthopnea, palpitations, paroxysmal nocturnal dyspnea Dermatological: No rash, lesions or masses Respiratory: No cough, dyspnea Urologic: No hematuria, dysuria Abdominal: No nausea, vomiting, diarrhea, bright red blood per rectum, melena, or hematemesis Neurologic: No visual changes, weakness, changes in mental status All other systems reviewed and are otherwise negative except as noted above.  Physical Exam Vitals: Blood pressure 138/70, pulse 76, height 5\' 1"  (1.549 m), weight 120 lb (54.432 kg).  General: Well developed, well appearing 80 y.o. female in no acute distress. HEENT: Normocephalic, atraumatic. EOMs intact. Sclera nonicteric. Oropharynx clear.  Neck: Supple. No JVD. Lungs: Respirations regular and unlabored, CTA bilaterally. No wheezes, rales or rhonchi. Heart: Occasionally irregular. S1, S2 present. No murmurs, rub, S3 or S4. Abdomen: Soft, non-distended.  Extremities: No clubbing, cyanosis or edema. PT/Radials 2+ and equal bilaterally. Psych: Normal affect. Neuro: Alert and oriented X 3. Moves all extremities spontaneously.   Diagnostics Echocardiogram Jan 2014 Study Conclusions - Left ventricle: The cavity size was normal. Systolic function was mildly to moderately reduced. The estimated ejection fraction was in the range of 40% to  45%. Regional wall motion abnormalities cannot be excluded. Doppler parameters are consistent with abnormal left ventricular relaxation (grade 1 diastolic dysfunction). - Ventricular septum: Septal motion showed paradox. - Mitral valve: Calcified annulus. Mild regurgitation. - Atrial septum: No defect or patent foramen ovale was identified. - Pulmonary arteries: Systolic pressure was moderately increased. PA peak pressure: 31mm Hg (S).  12-lead ECG today - shows what appears to be atrial fibrillation at 107 bpm with LBBB; however, her device interrogation shows SR with frequent runs of an atrial tachycardia; reviewed with Dr. Caryl Comes  Device interrogation today - normal PPM function; chronic elevation of RV lead impedance; currently in SR with frequent runs of atrial tachycardia and  1:1 conduction; no programming changes made; see PaceArt report for full details  Assessment and Plan 1. Atrial tachycardia - frequent, symptomatic - discussed options for treatment and will start amiodarone 200 mg twice daily - stop digoxin - continue BB - return for follow-up with Dr. Caryl Comes in 3-4 weeks 2. PAF - ECG appears as AF however device interrogation today shows SR with frequent runs of atrial tachycardia; reviewed ECG with Dr. Caryl Comes; plan as outlined in #1 3. LV dysfunction, EF 40-45% by echo Jan 2014 - will update echo 4. Sinus node dysfunction s/p PPM implant - normal device function - chronically elevated RV lead impedance - no programming changes made - continue routine remote device follow-up every 3 months  Signed, Amazing Cowman, PA-C 11/17/2013, 9:16 PM

## 2013-11-24 ENCOUNTER — Other Ambulatory Visit: Payer: Self-pay

## 2013-11-24 MED ORDER — METOPROLOL TARTRATE 50 MG PO TABS: 25.0000 mg | ORAL_TABLET | Freq: Four times a day (QID) | ORAL | Status: DC

## 2013-11-25 ENCOUNTER — Encounter: Payer: Medicare Other | Admitting: Internal Medicine

## 2013-12-01 ENCOUNTER — Other Ambulatory Visit (INDEPENDENT_AMBULATORY_CARE_PROVIDER_SITE_OTHER): Payer: Self-pay | Admitting: Otolaryngology

## 2013-12-01 ENCOUNTER — Encounter: Payer: Self-pay | Admitting: Internal Medicine

## 2013-12-01 ENCOUNTER — Ambulatory Visit (INDEPENDENT_AMBULATORY_CARE_PROVIDER_SITE_OTHER): Payer: Medicare Other | Admitting: Internal Medicine

## 2013-12-01 VITALS — BP 161/92 | HR 72 | Ht 64.5 in | Wt 122.0 lb

## 2013-12-01 DIAGNOSIS — R1310 Dysphagia, unspecified: Secondary | ICD-10-CM

## 2013-12-01 DIAGNOSIS — T82110A Breakdown (mechanical) of cardiac electrode, initial encounter: Secondary | ICD-10-CM

## 2013-12-01 DIAGNOSIS — I509 Heart failure, unspecified: Secondary | ICD-10-CM | POA: Insufficient documentation

## 2013-12-01 DIAGNOSIS — I495 Sick sinus syndrome: Secondary | ICD-10-CM

## 2013-12-01 DIAGNOSIS — R0602 Shortness of breath: Secondary | ICD-10-CM

## 2013-12-01 DIAGNOSIS — Z950 Presence of cardiac pacemaker: Secondary | ICD-10-CM

## 2013-12-01 DIAGNOSIS — I4891 Unspecified atrial fibrillation: Secondary | ICD-10-CM

## 2013-12-01 LAB — MDC_IDC_ENUM_SESS_TYPE_INCLINIC
Brady Statistic RA Percent Paced: 2 %
Implantable Pulse Generator Model: 1297
Lead Channel Impedance Value: 2500 Ohm
Lead Channel Impedance Value: 400 Ohm
Lead Channel Pacing Threshold Amplitude: 0.9 V
Lead Channel Pacing Threshold Pulse Width: 0.4 ms
Lead Channel Pacing Threshold Pulse Width: 1 ms
Lead Channel Setting Pacing Amplitude: 3.5 V
Lead Channel Setting Pacing Pulse Width: 1 ms
MDC IDC MSMT LEADCHNL RA SENSING INTR AMPL: 0.7 mV
MDC IDC MSMT LEADCHNL RV PACING THRESHOLD AMPLITUDE: 3.3 V
MDC IDC MSMT LEADCHNL RV SENSING INTR AMPL: 5.2 mV
MDC IDC PG SERIAL: 313770
MDC IDC SESS DTM: 20150128050000
MDC IDC SET LEADCHNL RA PACING AMPLITUDE: 2 V
MDC IDC SET LEADCHNL RV SENSING SENSITIVITY: 2.5 mV
MDC IDC STAT BRADY RV PERCENT PACED: 1 %

## 2013-12-01 MED ORDER — FUROSEMIDE 40 MG PO TABS: ORAL_TABLET | ORAL | Status: DC

## 2013-12-01 MED ORDER — METOPROLOL TARTRATE 50 MG PO TABS: 50.0000 mg | ORAL_TABLET | Freq: Two times a day (BID) | ORAL | Status: DC

## 2013-12-01 NOTE — Assessment & Plan Note (Signed)
Currently not an issue 

## 2013-12-01 NOTE — Assessment & Plan Note (Signed)
There is evidence of right sided overload  as well as left-sided overload. We'll begin her on a diuretic Lasix 40 mg twice daily x3 days and 40 mg daily. She is orally on Aldactone. We'll check a metabolic profile on Monday. We will also check a daily as an December   BUN/creatinine was 48/1.8

## 2013-12-01 NOTE — Patient Instructions (Addendum)
Your physician has recommended you make the following change in your medication:  1) Start Lasix 40 mg twice a day for 3 days, then  Take 40 mg daily 2) Change taking Metoprolol to 50 mg twice daily  Your physician recommends that you have lab work today: BMET  Your physician recommends that you return for lab work on: 2/2 for BMET/BNP  Your physician recommends that you schedule a follow-up appointment in: 2 weeks with Ileene Hutchinson, PAC.   Your physician wants you to follow-up in: 1 yea with Dr. Caryl Comes.  You will receive a reminder letter in the mail two months in advance. If you don't receive a letter, please call our office to schedule the follow-up appointment.

## 2013-12-01 NOTE — Assessment & Plan Note (Signed)
The patient's device was interrogated.  The information was reviewed. No changes were made in the programming.    

## 2013-12-01 NOTE — Progress Notes (Signed)
Patient Care Team: [name], MD as PCP - General (Internal Medicine)   HPI  Sophia Oliver is a 80 y.o. female seen in followup for a pacemaker implanted for sinus node dysfunction and syncope   She has over recent months noted significant deterioration in systolic function. This dates back to mid December. It is characterized by 13 dyspnea nocturnal dyspnea and orthopnea and peripheral edema. She was thought to be in atrial fibrillation. Cardioversion was anticipated. She's found that time to be in sinus rhythm. Amiodarone was initiated 2 weeks ago and she is in regular rhythm  Dig was stopped   . She also has chronic anemia  Past Medical History  Diagnosis Date  . Syncope   . Sinus node dysfunction   . S/P cardiac pacemaker procedure     Guidant  . Wide-complex tachycardia   . Cholelithiasis   . Glucose intolerance (impaired glucose tolerance)   . DJD (degenerative joint disease)   . Anxiety   . PONV (postoperative nausea and vomiting)   . Pacemaker   . Chronic kidney disease     frequency  . Breast cancer     left breast - hx of 2003   . Injury of right upper arm     after venipuncture in 3/13 - pt describes pain afterwards- no followup done seen by PCP- no further treatment   . PAF (paroxysmal atrial fibrillation)   . Left bundle branch block     Past Surgical History  Procedure Laterality Date  . S/p pacemaker    . Replacement total knee bilateral    . Total shoulder replacement    . Total hip arthroplasty    . Femur fracture surgery    . Hysterectomy, unspecified area    . Masterectomy    . Abdominal hysterectomy    . Breast surgery      left breast - 2003   . Total hip arthroplasty  06/16/2012    Procedure: TOTAL HIP ARTHROPLASTY;  Surgeon: Mauri Pole, MD;  Location: [facility];  Service: Orthopedics;  Laterality: Left;  . Hip closed reduction  06/30/2012    Procedure: CLOSED MANIPULATION HIP;  Surgeon: Mauri Pole, MD;  Location: [facility];  Service:  Orthopedics;  Laterality: Left;    Current Outpatient Prescriptions  Medication Sig Dispense Refill  . acetaminophen (TYLENOL) 325 MG tablet Take 325 mg by mouth every 6 (six) hours as needed for pain.      [name] ALPRAZolam (XANAX) 0.25 MG tablet Take 1 tablet (0.25 mg total) by mouth 2 (two) times daily.  20 tablet  0  . amiodarone (PACERONE) 200 MG tablet Take 1 tablet (200 mg total) by mouth 2 (two) times daily.  180 tablet  2  . B Complex-C (B-COMPLEX WITH VITAMIN C) tablet Take 1 tablet by mouth daily.      . cholecalciferol (VITAMIN D) 1000 UNITS tablet Take 1,000 Units by mouth daily.      [name] CINNAMON PO Take 1,000 mg by mouth.       . Cranberry-Vitamin C-Vitamin E (CRANBERRY PLUS VITAMIN C) 4200-20-3 MG-MG-UNIT CAPS Take 4,200 mg by mouth 2 (two) times daily.      . Cyanocobalamin (VITAMIN B 12 PO) Take by mouth.      . dabigatran (PRADAXA) 75 MG CAPS Take 75 mg by mouth 2 (two) times daily.   60 capsule    . diclofenac sodium (VOLTAREN) 1 % GEL Apply 2 g topically 4 (  four) times daily.  1 Tube  0  . fish oil-omega-3 fatty acids 1000 MG capsule Take 1 g by mouth daily.      . metoprolol (LOPRESSOR) 50 MG tablet Take 0.5 tablets (25 mg total) by mouth 4 (four) times daily.  60 tablet  6  . pantoprazole (PROTONIX) 40 MG tablet Take 40 mg by mouth daily.      [name] spironolactone (ALDACTONE) 25 MG tablet Take 25 mg by mouth daily.       No current facility-administered medications for this visit.    Allergies  Allergen Reactions  . Codeine Nausea And Vomiting  . Other Nausea And Vomiting    Narcotics  dizziness    Review of Systems negative except from HPI and PMH  Physical Exam BP 161/92  Pulse 72  Ht 5' 4.5" (1.638 m)  Wt 122 lb (55.339 kg)  BMI 20.63 kg/m2 Well developed and well nourished in no acute distress HENT normal E scleral and icterus clear Neck Supple JVP >10 carotids brisk and full Clear to ausculation  Regular rate and rhythm, no murmurs gallops or rub Soft  with active bowel sounds No clubbing cyanosis 3+ Edema Alert and oriented, grossly normal motor and sensory function Skin Warm and Dry  ECG demonstrates sinus rhythm with a left bundle branch block  Assessment and  Plan

## 2013-12-01 NOTE — Assessment & Plan Note (Signed)
Holding sinus rhythm currently on amiodarone. We'll decrease her amiodarone dose to 400 mg a day for 2 more weeks and then will drop it down to 200 mg a day.

## 2013-12-02 LAB — BASIC METABOLIC PANEL
BUN: 33 mg/dL — ABNORMAL HIGH (ref 6–23)
CALCIUM: 9.1 mg/dL (ref 8.4–10.5)
CO2: 21 mEq/L (ref 19–32)
Chloride: 111 mEq/L (ref 96–112)
Creatinine, Ser: 2.1 mg/dL — ABNORMAL HIGH (ref 0.4–1.2)
GFR: 23.28 mL/min — ABNORMAL LOW (ref >60.00)
GLUCOSE: 116 mg/dL — AB (ref 70–99)
Potassium: 4.7 mEq/L (ref 3.5–5.1)
Sodium: 141 mEq/L (ref 135–145)

## 2013-12-02 NOTE — Addendum Note (Signed)
Addended by: Srikar Chiang K on: 12/02/2013 02:11 PM   Modules accepted: Orders  

## 2013-12-02 NOTE — Addendum Note (Signed)
Addended by: Eulis Foster on: 12/02/2013 02:11 PM   Modules accepted: Orders

## 2013-12-03 ENCOUNTER — Other Ambulatory Visit (HOSPITAL_COMMUNITY): Payer: Medicare Other

## 2013-12-06 ENCOUNTER — Encounter: Payer: Self-pay | Admitting: Cardiovascular Disease

## 2013-12-06 ENCOUNTER — Ambulatory Visit (INDEPENDENT_AMBULATORY_CARE_PROVIDER_SITE_OTHER): Payer: Medicare Other | Admitting: *Deleted

## 2013-12-06 ENCOUNTER — Ambulatory Visit (HOSPITAL_COMMUNITY): Payer: Medicare Other | Attending: Cardiology | Admitting: Radiology

## 2013-12-06 DIAGNOSIS — R55 Syncope and collapse: Secondary | ICD-10-CM

## 2013-12-06 DIAGNOSIS — I447 Left bundle-branch block, unspecified: Secondary | ICD-10-CM | POA: Insufficient documentation

## 2013-12-06 DIAGNOSIS — I509 Heart failure, unspecified: Secondary | ICD-10-CM

## 2013-12-06 DIAGNOSIS — I495 Sick sinus syndrome: Secondary | ICD-10-CM

## 2013-12-06 DIAGNOSIS — I429 Cardiomyopathy, unspecified: Secondary | ICD-10-CM

## 2013-12-06 DIAGNOSIS — I059 Rheumatic mitral valve disease, unspecified: Secondary | ICD-10-CM | POA: Insufficient documentation

## 2013-12-06 DIAGNOSIS — T82110A Breakdown (mechanical) of cardiac electrode, initial encounter: Secondary | ICD-10-CM

## 2013-12-06 DIAGNOSIS — I4891 Unspecified atrial fibrillation: Secondary | ICD-10-CM

## 2013-12-06 DIAGNOSIS — I079 Rheumatic tricuspid valve disease, unspecified: Secondary | ICD-10-CM | POA: Insufficient documentation

## 2013-12-06 DIAGNOSIS — I517 Cardiomegaly: Secondary | ICD-10-CM | POA: Insufficient documentation

## 2013-12-06 DIAGNOSIS — C50919 Malignant neoplasm of unspecified site of unspecified female breast: Secondary | ICD-10-CM | POA: Insufficient documentation

## 2013-12-06 DIAGNOSIS — R0602 Shortness of breath: Secondary | ICD-10-CM

## 2013-12-06 DIAGNOSIS — I428 Other cardiomyopathies: Secondary | ICD-10-CM

## 2013-12-06 DIAGNOSIS — Z87891 Personal history of nicotine dependence: Secondary | ICD-10-CM | POA: Insufficient documentation

## 2013-12-06 LAB — BASIC METABOLIC PANEL
BUN: 43 mg/dL — ABNORMAL HIGH (ref 6–23)
CO2: 26 mEq/L (ref 19–32)
CREATININE: 2.7 mg/dL — AB (ref 0.4–1.2)
Calcium: 8.9 mg/dL (ref 8.4–10.5)
Chloride: 101 mEq/L (ref 96–112)
GFR: 17.86 mL/min — AB (ref >60.00)
Glucose, Bld: 100 mg/dL — ABNORMAL HIGH (ref 70–99)
Potassium: 4.4 mEq/L (ref 3.5–5.1)
SODIUM: 140 meq/L (ref 135–145)

## 2013-12-06 LAB — BRAIN NATRIURETIC PEPTIDE: Pro B Natriuretic peptide (BNP): 200 pg/mL — ABNORMAL HIGH (ref 0.0–100.0)

## 2013-12-06 NOTE — Progress Notes (Signed)
Echocardiogram performed.  

## 2013-12-07 ENCOUNTER — Ambulatory Visit (HOSPITAL_COMMUNITY)
Admission: RE | Admit: 2013-12-07 | Discharge: 2013-12-07 | Disposition: A | Discharge status: Home / Self Care | Payer: Medicare Other | Source: Ambulatory Visit | Attending: Otolaryngology | Admitting: Otolaryngology

## 2013-12-07 DIAGNOSIS — Z981 Arthrodesis status: Secondary | ICD-10-CM | POA: Insufficient documentation

## 2013-12-07 DIAGNOSIS — K449 Diaphragmatic hernia without obstruction or gangrene: Secondary | ICD-10-CM | POA: Insufficient documentation

## 2013-12-07 DIAGNOSIS — K224 Dyskinesia of esophagus: Secondary | ICD-10-CM | POA: Insufficient documentation

## 2013-12-07 DIAGNOSIS — R1310 Dysphagia, unspecified: Secondary | ICD-10-CM

## 2013-12-07 DIAGNOSIS — Q762 Congenital spondylolisthesis: Secondary | ICD-10-CM | POA: Insufficient documentation

## 2013-12-09 ENCOUNTER — Other Ambulatory Visit: Payer: Self-pay | Admitting: *Deleted

## 2013-12-09 DIAGNOSIS — R7989 Other specified abnormal findings of blood chemistry: Secondary | ICD-10-CM

## 2013-12-13 ENCOUNTER — Other Ambulatory Visit (INDEPENDENT_AMBULATORY_CARE_PROVIDER_SITE_OTHER): Payer: Medicare Other

## 2013-12-13 DIAGNOSIS — R799 Abnormal finding of blood chemistry, unspecified: Secondary | ICD-10-CM

## 2013-12-13 DIAGNOSIS — R7989 Other specified abnormal findings of blood chemistry: Secondary | ICD-10-CM

## 2013-12-13 LAB — BASIC METABOLIC PANEL
BUN: 45 mg/dL — ABNORMAL HIGH (ref 6–23)
CALCIUM: 9 mg/dL (ref 8.4–10.5)
CO2: 19 mEq/L (ref 19–32)
Chloride: 113 mEq/L — ABNORMAL HIGH (ref 96–112)
Creatinine, Ser: 1.8 mg/dL — ABNORMAL HIGH (ref 0.4–1.2)
GFR: 27.56 mL/min — AB (ref >60.00)
Glucose, Bld: 153 mg/dL — ABNORMAL HIGH (ref 70–99)
Potassium: 4.8 mEq/L (ref 3.5–5.1)
Sodium: 141 mEq/L (ref 135–145)

## 2013-12-15 ENCOUNTER — Encounter: Payer: Self-pay | Admitting: Cardiology

## 2013-12-15 ENCOUNTER — Other Ambulatory Visit: Payer: Medicare Other

## 2013-12-15 ENCOUNTER — Ambulatory Visit (INDEPENDENT_AMBULATORY_CARE_PROVIDER_SITE_OTHER): Payer: Medicare Other | Admitting: Cardiology

## 2013-12-15 VITALS — BP 144/60 | HR 44 | Ht 64.5 in | Wt 113.0 lb

## 2013-12-15 DIAGNOSIS — I4891 Unspecified atrial fibrillation: Secondary | ICD-10-CM

## 2013-12-15 DIAGNOSIS — Z950 Presence of cardiac pacemaker: Secondary | ICD-10-CM

## 2013-12-15 DIAGNOSIS — I495 Sick sinus syndrome: Secondary | ICD-10-CM

## 2013-12-15 DIAGNOSIS — I5022 Chronic systolic (congestive) heart failure: Secondary | ICD-10-CM

## 2013-12-15 MED ORDER — AMIODARONE HCL 200 MG PO TABS: 200.0000 mg | ORAL_TABLET | Freq: Every day | ORAL | Status: DC

## 2013-12-15 MED ORDER — FUROSEMIDE 40 MG PO TABS: 40.0000 mg | ORAL_TABLET | Freq: Every day | ORAL | Status: DC | PRN

## 2013-12-15 NOTE — Patient Instructions (Signed)
Your physician recommends that you schedule a follow-up appointment in: [place] ON DAY DR. Caryl Comes IS IN OFFICE 03-01-14 AT 2:00PM   Your physician has recommended you make the following change in your medication:   DECREASE YOUR AMIODARONE 200 MG ONCE A DAY DECREASE YOUR LASIX 40 MG ONCE A DAY AS NEEDED

## 2013-12-21 NOTE — Progress Notes (Signed)
Patient ID: Sophia Oliver MRN: [id number], DOB/AGE: 78-May-1925   Date of Visit: 12/15/2013  Primary Physician: [name], MD Primary Cardiologist: Terrence Dupont, MD Primary EP: Caryl Comes, MD Reason for Visit: EP follow-up  History of Present Illness  Sophia Oliver is a 80 y.o. female with sinus node dysfunction s/p PPM implant, chronically elevated RV lead impedance, PAF, PAT, LBBB and CKD who presents today for EP followup.   Over recent months she has noted significant deterioration in systolic function, dating back to mid December. She has had nocturnal dyspnea, orthopnea and LE edema. She was thought to be in atrial fibrillation. Cardioversion was anticipated; however at that time she was found to be in SR. Amiodarone was initiated and digoxin stopped. Two weeks ago Dr. Caryl Comes added lasix and she presents today for follow-up.  Since last being seen in our clinic, she reports she is doing better. She has noticed some nausea with amiodarone. Her DOE and LE swelling have improved. She denies chest pain. She denies palpitations, dizziness, near syncope or syncope. She denies orthopnea or PND. She is compliant with medications.  Past Medical History Past Medical History  Diagnosis Date  . Syncope   . Sinus node dysfunction   . S/P cardiac pacemaker procedure     Guidant  . Wide-complex tachycardia   . Cholelithiasis   . Glucose intolerance (impaired glucose tolerance)   . DJD (degenerative joint disease)   . Anxiety   . PONV (postoperative nausea and vomiting)   . Pacemaker   . Chronic kidney disease     frequency  . Breast cancer     left breast - hx of 2003   . Injury of right upper arm     after venipuncture in 3/13 - pt describes pain afterwards- no followup done seen by PCP- no further treatment   . PAF (paroxysmal atrial fibrillation)   . Left bundle branch block     Past Surgical History Past Surgical History  Procedure Laterality Date  . S/p pacemaker    . Replacement total  knee bilateral    . Total shoulder replacement    . Total hip arthroplasty    . Femur fracture surgery    . Hysterectomy, unspecified area    . Masterectomy    . Abdominal hysterectomy    . Breast surgery      left breast - 2003   . Total hip arthroplasty  06/16/2012    Procedure: TOTAL HIP ARTHROPLASTY;  Surgeon: Mauri Pole, MD;  Location: [facility];  Service: Orthopedics;  Laterality: Left;  . Hip closed reduction  06/30/2012    Procedure: CLOSED MANIPULATION HIP;  Surgeon: Mauri Pole, MD;  Location: [facility];  Service: Orthopedics;  Laterality: Left;    Allergies/Intolerances Allergies  Allergen Reactions  . Codeine Nausea And Vomiting  . Other Nausea And Vomiting    Narcotics  dizziness    Current Home Medications Current Outpatient Prescriptions  Medication Sig Dispense Refill  . acetaminophen (TYLENOL) 325 MG tablet Take 325 mg by mouth every 6 (six) hours as needed for pain.      [name] ALPRAZolam (XANAX) 0.25 MG tablet Take 1 tablet (0.25 mg total) by mouth 2 (two) times daily.  20 tablet  0  . amiodarone (PACERONE) 200 MG tablet Take 1 tablet (200 mg total) by mouth daily.  90 tablet  0  . B Complex-C (B-COMPLEX WITH VITAMIN C) tablet Take 1 tablet by mouth daily.      [name]  cholecalciferol (VITAMIN D) 1000 UNITS tablet Take 1,000 Units by mouth daily.      [name] CINNAMON PO Take 1,000 mg by mouth.       . Cranberry-Vitamin C-Vitamin E (CRANBERRY PLUS VITAMIN C) 4200-20-3 MG-MG-UNIT CAPS Take 4,200 mg by mouth 2 (two) times daily.      . Cyanocobalamin (VITAMIN B 12 PO) Take by mouth.      . dabigatran (PRADAXA) 75 MG CAPS Take 75 mg by mouth 2 (two) times daily.   60 capsule    . diclofenac sodium (VOLTAREN) 1 % GEL Apply 2 g topically 4 (four) times daily.  1 Tube  0  . fish oil-omega-3 fatty acids 1000 MG capsule Take 1 g by mouth daily.      . furosemide (LASIX) 40 MG tablet Take 1 tablet (40 mg total) by mouth daily as needed.  90 tablet  0  . metoprolol (LOPRESSOR) 50 MG  tablet Take 1 tablet (50 mg total) by mouth 2 (two) times daily.  60 tablet  6  . pantoprazole (PROTONIX) 40 MG tablet Take 40 mg by mouth daily.      [name] spironolactone (ALDACTONE) 25 MG tablet Take 25 mg by mouth daily.       No current facility-administered medications for this visit.    Social History History   Social History  . Marital Status: Married    Spouse Name: N/A    Number of Children: N/A  . Years of Education: N/A   Occupational History  . Not on file.   Social History Main Topics  . Smoking status: Former Smoker    Types: Cigarettes    Quit date: 11/04/1948  . Smokeless tobacco: Never Used  . Alcohol Use: No  . Drug Use: No  . Sexual Activity: No   Other Topics Concern  . Not on file   Social History Narrative   Married, retired.      Review of Systems General: No chills, fever, night sweats or weight changes Cardiovascular: No chest pain, dyspnea on exertion, edema, orthopnea, palpitations, paroxysmal nocturnal dyspnea Dermatological: No rash, lesions or masses Respiratory: No cough, dyspnea Urologic: No hematuria, dysuria Abdominal: No nausea, vomiting, diarrhea, bright red blood per rectum, melena, or hematemesis Neurologic: No visual changes, weakness, changes in mental status All other systems reviewed and are otherwise negative except as noted above.  Physical Exam Vitals: Blood pressure 144/60, pulse 44, height 5' 4.5" (1.638 m), weight 113 lb (51.256 kg), SpO2 96.00%.  General: Well developed, well appearing 80 y.o. female in no acute distress. HEENT: Normocephalic, atraumatic. EOMs intact. Sclera nonicteric. Oropharynx clear.  Neck: Supple. No JVD. Lungs: Respirations regular and unlabored, CTA bilaterally. No wheezes, rales or rhonchi. Heart: RRR. S1, S2 present. No murmurs, rub, S3 or S4. Abdomen: Soft, non-distended.  Extremities: No clubbing or cyanosis. Trace LE edema bilaterally. PT/Radials 2+ and equal bilaterally. Psych: Normal  affect. Neuro: Alert and oriented X 3. Moves all extremities spontaneously.   Diagnostics Recent labs BMET    Component Value Date/Time   NA 141 12/13/2013 1148   K 4.8 12/13/2013 1148   CL 113* 12/13/2013 1148   CO2 19 12/13/2013 1148   GLUCOSE 153* 12/13/2013 1148   BUN 45* 12/13/2013 1148   CREATININE 1.8* 12/13/2013 1148   CALCIUM 9.0 12/13/2013 1148   GFRNONAA 31* 11/25/2012 0448   GFRAA 36* 11/25/2012 0448   2D echo 12/06/2013 Study Conclusions - Left ventricle: Systolic function was moderately to severely reduced. The estimated  ejection fraction was in the range of 30% to 35%. Hypokinesis of the inferolateral, inferior, and inferoseptal myocardium. Doppler parameters are consistent with restrictive physiology, indicative of decreased left ventricular diastolic compliance and/or increased left atrial pressure (evaluation less specific in the setting of atrial fibrillation).. - Ventricular septum: Septal motion showed abnormal function, dyssynergy, and paradox. These changes are consistent with right ventricular pacing. - Mitral valve: Moderately calcified annulus. Mildly thickened, mildly calcified leaflets . Mild to moderate regurgitation directed centrally. The acceleration rate of the regurgitant jet was reduced, consistent with a low dP/dt. - Left atrium: The atrium was moderately to severely dilated. - Right ventricle: Systolic function was mildly reduced. - Atrial septum: No defect or patent foramen ovale was identified. - Pulmonary arteries: PA peak pressure: 27mm Hg (S).  Device interrogation - full interrogation performed 2 weeks ago and normal - quick look for arrhythmias today - in SR today with one episode of nonsustained atrial tachycardia since last reset on 12/01/2013  Assessment and Plan  1. Paroxysmal atrial tachycardia and atrial fibrillation - maintaining SR on amiodarone - will reduce amiodarone to 200 mg once daily - continue Pradaxa for stroke  prevention - return for follow-up in 6 weeks  2. LV dysfunction, EF 30-35%, with recent acute on chronic HF (previously 40-45% by echo Jan 2014) - volume status improved with Lasix - now with BUN/Cr increased from baseline, will decrease Lasix to 40 mg daily as needed  - repeat BMET in one week - follow-up with Dr. Terrence Dupont in 2 weeks  Signed, Ileene Hutchinson, PA-C 12/21/2013, 1:15 PM

## 2014-01-02 HISTORY — PX: REFRACTIVE SURGERY: SHX103

## 2014-01-06 ENCOUNTER — Encounter: Payer: Self-pay | Admitting: Internal Medicine

## 2014-01-18 ENCOUNTER — Inpatient Hospital Stay (HOSPITAL_COMMUNITY)
Admission: EM | Admit: 2014-01-18 | Discharge: 2014-01-23 | DRG: 872 | Disposition: A | Discharge status: Home / Self Care | Payer: Medicare Other | Attending: Internal Medicine | Admitting: Internal Medicine

## 2014-01-18 ENCOUNTER — Encounter (HOSPITAL_COMMUNITY): Payer: Self-pay | Admitting: Emergency Medicine

## 2014-01-18 DIAGNOSIS — Z96619 Presence of unspecified artificial shoulder joint: Secondary | ICD-10-CM

## 2014-01-18 DIAGNOSIS — E119 Type 2 diabetes mellitus without complications: Secondary | ICD-10-CM

## 2014-01-18 DIAGNOSIS — Z853 Personal history of malignant neoplasm of breast: Secondary | ICD-10-CM

## 2014-01-18 DIAGNOSIS — Z79899 Other long term (current) drug therapy: Secondary | ICD-10-CM

## 2014-01-18 DIAGNOSIS — I4891 Unspecified atrial fibrillation: Secondary | ICD-10-CM

## 2014-01-18 DIAGNOSIS — N189 Chronic kidney disease, unspecified: Secondary | ICD-10-CM

## 2014-01-18 DIAGNOSIS — I480 Paroxysmal atrial fibrillation: Secondary | ICD-10-CM

## 2014-01-18 DIAGNOSIS — I5022 Chronic systolic (congestive) heart failure: Secondary | ICD-10-CM

## 2014-01-18 DIAGNOSIS — A419 Sepsis, unspecified organism: Secondary | ICD-10-CM

## 2014-01-18 DIAGNOSIS — R55 Syncope and collapse: Secondary | ICD-10-CM

## 2014-01-18 DIAGNOSIS — A498 Other bacterial infections of unspecified site: Secondary | ICD-10-CM | POA: Diagnosis present

## 2014-01-18 DIAGNOSIS — Z87891 Personal history of nicotine dependence: Secondary | ICD-10-CM

## 2014-01-18 DIAGNOSIS — N183 Chronic kidney disease, stage 3 unspecified: Secondary | ICD-10-CM

## 2014-01-18 DIAGNOSIS — E875 Hyperkalemia: Secondary | ICD-10-CM

## 2014-01-18 DIAGNOSIS — I495 Sick sinus syndrome: Secondary | ICD-10-CM

## 2014-01-18 DIAGNOSIS — F411 Generalized anxiety disorder: Secondary | ICD-10-CM | POA: Diagnosis present

## 2014-01-18 DIAGNOSIS — Z96649 Presence of unspecified artificial hip joint: Secondary | ICD-10-CM

## 2014-01-18 DIAGNOSIS — Z96659 Presence of unspecified artificial knee joint: Secondary | ICD-10-CM

## 2014-01-18 DIAGNOSIS — N390 Urinary tract infection, site not specified: Secondary | ICD-10-CM

## 2014-01-18 DIAGNOSIS — Z950 Presence of cardiac pacemaker: Secondary | ICD-10-CM

## 2014-01-18 DIAGNOSIS — R531 Weakness: Secondary | ICD-10-CM

## 2014-01-18 DIAGNOSIS — I509 Heart failure, unspecified: Secondary | ICD-10-CM

## 2014-01-18 DIAGNOSIS — T82110A Breakdown (mechanical) of cardiac electrode, initial encounter: Secondary | ICD-10-CM

## 2014-01-18 LAB — URINALYSIS, ROUTINE W REFLEX MICROSCOPIC
Bilirubin Urine: NEGATIVE
Glucose, UA: >1000 mg/dL — AB
Ketones, ur: NEGATIVE mg/dL
Nitrite: NEGATIVE
Protein, ur: 100 mg/dL — AB
Specific Gravity, Urine: 1.022 (ref 1.005–1.030)
Urobilinogen, UA: 1 mg/dL (ref 0.0–1.0)
pH: 5 (ref 5.0–8.0)

## 2014-01-18 LAB — COMPREHENSIVE METABOLIC PANEL
ALT: 25 U/L (ref 0–35)
AST: 16 U/L (ref 0–37)
Albumin: 3.3 g/dL — ABNORMAL LOW (ref 3.5–5.2)
Alkaline Phosphatase: 91 U/L (ref 39–117)
BUN: 55 mg/dL — ABNORMAL HIGH (ref 6–23)
CALCIUM: 8.9 mg/dL (ref 8.4–10.5)
CO2: 20 meq/L (ref 19–32)
Chloride: 100 mEq/L (ref 96–112)
Creatinine, Ser: 1.61 mg/dL — ABNORMAL HIGH (ref 0.50–1.10)
GFR calc Af Amer: 32 mL/min — ABNORMAL LOW (ref >90)
GFR, EST NON AFRICAN AMERICAN: 28 mL/min — AB (ref >90)
Glucose, Bld: 344 mg/dL — ABNORMAL HIGH (ref 70–99)
Potassium: 5.5 mEq/L — ABNORMAL HIGH (ref 3.7–5.3)
SODIUM: 137 meq/L (ref 137–147)
TOTAL PROTEIN: 6 g/dL (ref 6.0–8.3)
Total Bilirubin: 0.6 mg/dL (ref 0.3–1.2)

## 2014-01-18 LAB — CBC
HCT: 36 % (ref 36.0–46.0)
Hemoglobin: 12 g/dL (ref 12.0–15.0)
MCH: 31.3 pg (ref 26.0–34.0)
MCHC: 33.3 g/dL (ref 30.0–36.0)
MCV: 93.8 fL (ref 78.0–100.0)
PLATELETS: 276 10*3/uL (ref 150–400)
RBC: 3.84 MIL/uL — AB (ref 3.87–5.11)
RDW: 15.3 % (ref 11.5–15.5)
WBC: 27 10*3/uL — ABNORMAL HIGH (ref 4.0–10.5)

## 2014-01-18 LAB — GLUCOSE, CAPILLARY: Glucose-Capillary: 211 mg/dL — ABNORMAL HIGH (ref 70–99)

## 2014-01-18 LAB — I-STAT CG4 LACTIC ACID, ED: LACTIC ACID, VENOUS: 1.84 mmol/L (ref 0.5–2.2)

## 2014-01-18 LAB — URINE MICROSCOPIC-ADD ON

## 2014-01-18 MED ORDER — B COMPLEX-C PO TABS
1.0000 | ORAL_TABLET | Freq: Every day | ORAL | Status: DC
  Administered 2014-01-19 – 2014-01-23 (×5): 1 via ORAL
  Filled 2014-01-18 (×5): qty 1

## 2014-01-18 MED ORDER — SODIUM CHLORIDE 0.9 % IV BOLUS (SEPSIS)
1000.0000 mL | Freq: Once | INTRAVENOUS | Status: AC
  Administered 2014-01-18: 1000 mL via INTRAVENOUS

## 2014-01-18 MED ORDER — ACETAMINOPHEN 325 MG PO TABS
650.0000 mg | ORAL_TABLET | Freq: Once | ORAL | Status: AC
  Administered 2014-01-18: 650 mg via ORAL
  Filled 2014-01-18: qty 2

## 2014-01-18 MED ORDER — INSULIN ASPART 100 UNIT/ML ~~LOC~~ SOLN
0.0000 [IU] | Freq: Every day | SUBCUTANEOUS | Status: DC
  Administered 2014-01-18 – 2014-01-21 (×3): 2 [IU] via SUBCUTANEOUS

## 2014-01-18 MED ORDER — DEXTROSE 5 % IV SOLN
1.0000 g | INTRAVENOUS | Status: DC
  Administered 2014-01-19 – 2014-01-21 (×3): 1 g via INTRAVENOUS
  Filled 2014-01-18 (×4): qty 10

## 2014-01-18 MED ORDER — ALPRAZOLAM 0.25 MG PO TABS
0.2500 mg | ORAL_TABLET | Freq: Two times a day (BID) | ORAL | Status: DC
  Administered 2014-01-18 – 2014-01-23 (×10): 0.25 mg via ORAL
  Filled 2014-01-18 (×10): qty 1

## 2014-01-18 MED ORDER — DEXTROSE 5 % IV SOLN
1.0000 g | Freq: Once | INTRAVENOUS | Status: AC
  Administered 2014-01-18: 1 g via INTRAVENOUS
  Filled 2014-01-18: qty 10

## 2014-01-18 MED ORDER — METOPROLOL TARTRATE 50 MG PO TABS
50.0000 mg | ORAL_TABLET | Freq: Two times a day (BID) | ORAL | Status: DC
  Administered 2014-01-18 – 2014-01-23 (×10): 50 mg via ORAL
  Filled 2014-01-18 (×11): qty 1

## 2014-01-18 MED ORDER — ONDANSETRON HCL 4 MG PO TABS: 4.0000 mg | ORAL_TABLET | Freq: Four times a day (QID) | ORAL | Status: DC | PRN

## 2014-01-18 MED ORDER — PANTOPRAZOLE SODIUM 40 MG PO TBEC
40.0000 mg | DELAYED_RELEASE_TABLET | Freq: Every day | ORAL | Status: DC
  Administered 2014-01-19 – 2014-01-23 (×5): 40 mg via ORAL
  Filled 2014-01-18 (×5): qty 1

## 2014-01-18 MED ORDER — ONDANSETRON HCL 4 MG/2ML IJ SOLN: 4.0000 mg | Freq: Four times a day (QID) | INTRAMUSCULAR | Status: DC | PRN

## 2014-01-18 MED ORDER — DABIGATRAN ETEXILATE MESYLATE 75 MG PO CAPS
75.0000 mg | ORAL_CAPSULE | Freq: Two times a day (BID) | ORAL | Status: DC
  Administered 2014-01-18 – 2014-01-23 (×10): 75 mg via ORAL
  Filled 2014-01-18 (×11): qty 1

## 2014-01-18 MED ORDER — INSULIN ASPART 100 UNIT/ML ~~LOC~~ SOLN
0.0000 [IU] | Freq: Three times a day (TID) | SUBCUTANEOUS | Status: DC
  Administered 2014-01-19 (×3): 1 [IU] via SUBCUTANEOUS
  Administered 2014-01-20: 3 [IU] via SUBCUTANEOUS
  Administered 2014-01-20: 13:00:00 via SUBCUTANEOUS
  Administered 2014-01-20 – 2014-01-21 (×3): 2 [IU] via SUBCUTANEOUS
  Administered 2014-01-21 – 2014-01-22 (×3): 3 [IU] via SUBCUTANEOUS

## 2014-01-18 MED ORDER — AMIODARONE HCL 200 MG PO TABS
200.0000 mg | ORAL_TABLET | Freq: Every day | ORAL | Status: DC
  Administered 2014-01-19 – 2014-01-23 (×5): 200 mg via ORAL
  Filled 2014-01-18 (×5): qty 1

## 2014-01-18 MED ORDER — ACETAMINOPHEN 325 MG PO TABS
325.0000 mg | ORAL_TABLET | Freq: Four times a day (QID) | ORAL | Status: DC | PRN
Start: 2014-01-18 — End: 2014-01-23
  Administered 2014-01-19: 325 mg via ORAL
  Filled 2014-01-18: qty 1

## 2014-01-18 MED ORDER — SODIUM CHLORIDE 0.9 % IV SOLN
INTRAVENOUS | Status: DC
  Administered 2014-01-18: 23:00:00 via INTRAVENOUS

## 2014-01-18 NOTE — ED Notes (Signed)
Pt reports being sent here from pcp due to having chills/shaking and urinary frequency and incontinence.

## 2014-01-18 NOTE — ED Notes (Signed)
Dr. David at bedside. 

## 2014-01-18 NOTE — ED Provider Notes (Signed)
CSN: [id number]     Arrival date & time 01/18/14  1533 History   First MD Initiated Contact with Patient 01/18/14 1831     Chief Complaint  Patient presents with  . Urinary Frequency  . Fever     (Consider location/radiation/quality/duration/timing/severity/associated sxs/prior Treatment) The history is provided by the patient. No language interpreter was used.  Sophia Oliver is a 80 y/o F with PMHx of syncope, DJD, chronic kidney disease, LBBB, PAF, pacemaker placement presenting to the ED with fever and increased urinary frequency that has been ongoing for the past 2-3 weeks. Patient reported that she has been having to urinate at least 4-5 times per day that is more than the normal. Patient reported that she has been having lower back pain over the past couple of days. Stated that she has been feeling feverish and that she has been taking Tylenol with minimal relief. Stated that she has been feeling cold all day. Reported that she was seen by her cardiologist early today who recommended patient to come to the ED to be assessed due to thinking that she may had a urinary tract infection. Denied abdominal pain, nausea, vomiting, diarrhea, dysuria, hematuria, cough, nasal congestion, chest pain, shortness of breath, difficulty breathing.  PCP Dr. Sharion Dove  Past Medical History  Diagnosis Date  . Syncope   . Sinus node dysfunction   . S/P cardiac pacemaker procedure     Guidant  . Wide-complex tachycardia   . Cholelithiasis   . Glucose intolerance (impaired glucose tolerance)   . DJD (degenerative joint disease)   . Anxiety   . PONV (postoperative nausea and vomiting)   . Pacemaker   . Chronic kidney disease     frequency  . Breast cancer     left breast - hx of 2003   . Injury of right upper arm     after venipuncture in 3/13 - pt describes pain afterwards- no followup done seen by PCP- no further treatment   . PAF (paroxysmal atrial fibrillation)   . Left bundle branch block     Past Surgical History  Procedure Laterality Date  . S/p pacemaker    . Replacement total knee bilateral    . Total shoulder replacement    . Total hip arthroplasty    . Femur fracture surgery    . Hysterectomy, unspecified area    . Masterectomy    . Abdominal hysterectomy    . Breast surgery      left breast - 2003   . Total hip arthroplasty  06/16/2012    Procedure: TOTAL HIP ARTHROPLASTY;  Surgeon: Mauri Pole, MD;  Location: [facility];  Service: Orthopedics;  Laterality: Left;  . Hip closed reduction  06/30/2012    Procedure: CLOSED MANIPULATION HIP;  Surgeon: Mauri Pole, MD;  Location: [facility];  Service: Orthopedics;  Laterality: Left;   History reviewed. No pertinent family history. History  Substance Use Topics  . Smoking status: Former Smoker    Types: Cigarettes    Quit date: 11/04/1948  . Smokeless tobacco: Never Used  . Alcohol Use: No   OB History   Grav Para Term Preterm Abortions TAB SAB Ect Mult Living                 Review of Systems  Constitutional: Negative for fever and chills.  Respiratory: Negative for chest tightness and shortness of breath.   Cardiovascular: Negative for chest pain.  Gastrointestinal: Negative for nausea, vomiting and  abdominal pain.  Genitourinary: Positive for urgency and frequency. Negative for dysuria, hematuria, flank pain and pelvic pain.  Neurological: Positive for weakness. Negative for dizziness and headaches.  All other systems reviewed and are negative.      Allergies  Codeine and Other  Home Medications   Current Outpatient Rx  Name  Route  Sig  Dispense  Refill  . acetaminophen (TYLENOL) 325 MG tablet   Oral   Take 325 mg by mouth every 6 (six) hours as needed for pain.         [name] ALPRAZolam (XANAX) 0.25 MG tablet   Oral   Take 1 tablet (0.25 mg total) by mouth 2 (two) times daily.   20 tablet   0   . amiodarone (PACERONE) 200 MG tablet   Oral   Take 1 tablet (200 mg total) by mouth daily.   90  tablet   0   . B Complex-C (B-COMPLEX WITH VITAMIN C) tablet   Oral   Take 1 tablet by mouth daily.         . dabigatran (PRADAXA) 75 MG CAPS   Oral   Take 75 mg by mouth 2 (two) times daily.    60 capsule      . diclofenac sodium (VOLTAREN) 1 % GEL   Topical   Apply 2 g topically 4 (four) times daily.   1 Tube   0   . metoprolol (LOPRESSOR) 50 MG tablet   Oral   Take 1 tablet (50 mg total) by mouth 2 (two) times daily.   60 tablet   6     New dosage   . pantoprazole (PROTONIX) 40 MG tablet   Oral   Take 40 mg by mouth daily.          BP 145/53  Pulse 48  Temp(Src) 100 F (37.8 C) (Rectal)  Resp 14  SpO2 100% Physical Exam  Nursing note and vitals reviewed. Constitutional: She is oriented to person, place, and time. She appears well-developed and well-nourished. No distress.  HENT:  Head: Normocephalic and atraumatic.  Mouth/Throat: No oropharyngeal exudate.  Dry mucus membranes  Eyes: Conjunctivae and EOM are normal. Pupils are equal, round, and reactive to light. Right eye exhibits no discharge. Left eye exhibits no discharge.  Neck: Normal range of motion. Neck supple. No tracheal deviation present.  Cardiovascular: Normal rate, regular rhythm and normal heart sounds.  Exam reveals no friction rub.   No murmur heard. Pulses:      Radial pulses are 2+ on the right side, and 2+ on the left side.       Dorsalis pedis pulses are 2+ on the right side, and 2+ on the left side.  Pulmonary/Chest: Effort normal and breath sounds normal. No respiratory distress. She has no wheezes. She has no rales.  Abdominal: Soft. Bowel sounds are normal. There is tenderness in the suprapubic area. There is no guarding.    Discomfort upon palpation to the suprapubic region  Musculoskeletal: Normal range of motion.  Lymphadenopathy:    She has no cervical adenopathy.  Neurological: She is alert and oriented to person, place, and time. She exhibits normal muscle tone.  Coordination normal.  Skin: Skin is warm and dry. No rash noted. She is not diaphoretic. No erythema.  Psychiatric: She has a normal mood and affect. Her behavior is normal. Thought content normal.    ED Course  Procedures (including critical care time)  7:53 PM This provider spoke with Dr.  Loralee Pacas regarding case, history, presentation, labs, imaging, results. This provider recommended patient to be admitted for UTI with fever, elevated WBC and dehydration. Dr. Loralee Pacas reported that she will see the patient. Reported to wait for the lactic acid.   Results for orders placed during the hospital encounter of 01/18/14  URINALYSIS, ROUTINE W REFLEX MICROSCOPIC      Result Value Ref Range   Color, Urine YELLOW  YELLOW   APPearance CLOUDY (*) CLEAR   Specific Gravity, Urine 1.022  1.005 - 1.030   pH 5.0  5.0 - 8.0   Glucose, UA >1000 (*) NEGATIVE mg/dL   Hgb urine dipstick MODERATE (*) NEGATIVE   Bilirubin Urine NEGATIVE  NEGATIVE   Ketones, ur NEGATIVE  NEGATIVE mg/dL   Protein, ur 100 (*) NEGATIVE mg/dL   Urobilinogen, UA 1.0  0.0 - 1.0 mg/dL   Nitrite NEGATIVE  NEGATIVE   Leukocytes, UA LARGE (*) NEGATIVE  CBC      Result Value Ref Range   WBC 27.0 (*) 4.0 - 10.5 K/uL   RBC 3.84 (*) 3.87 - 5.11 MIL/uL   Hemoglobin 12.0  12.0 - 15.0 g/dL   HCT 36.0  36.0 - 46.0 %   MCV 93.8  78.0 - 100.0 fL   MCH 31.3  26.0 - 34.0 pg   MCHC 33.3  30.0 - 36.0 g/dL   RDW 15.3  11.5 - 15.5 %   Platelets 276  150 - 400 K/uL  COMPREHENSIVE METABOLIC PANEL      Result Value Ref Range   Sodium 137  137 - 147 mEq/L   Potassium 5.5 (*) 3.7 - 5.3 mEq/L   Chloride 100  96 - 112 mEq/L   CO2 20  19 - 32 mEq/L   Glucose, Bld 344 (*) 70 - 99 mg/dL   BUN 55 (*) 6 - 23 mg/dL   Creatinine, Ser 1.61 (*) 0.50 - 1.10 mg/dL   Calcium 8.9  8.4 - 10.5 mg/dL   Total Protein 6.0  6.0 - 8.3 g/dL   Albumin 3.3 (*) 3.5 - 5.2 g/dL   AST 16  0 - 37 U/L   ALT 25  0 - 35 U/L   Alkaline Phosphatase 91  39 - 117 U/L    Total Bilirubin 0.6  0.3 - 1.2 mg/dL   GFR calc non Af Amer 28 (*) >90 mL/min   GFR calc Af Amer 32 (*) >90 mL/min  URINE MICROSCOPIC-ADD ON      Result Value Ref Range   Squamous Epithelial / LPF RARE  RARE   WBC, UA TOO NUMEROUS TO COUNT  <3 WBC/hpf   RBC / HPF 0-2  <3 RBC/hpf   Bacteria, UA MANY (*) RARE  I-STAT CG4 LACTIC ACID, ED      Result Value Ref Range   Lactic Acid, Venous 1.84  0.5 - 2.2 mmol/L     Labs Review Labs Reviewed  URINALYSIS, ROUTINE W REFLEX MICROSCOPIC - Abnormal; Notable for the following:    APPearance CLOUDY (*)    Glucose, UA >1000 (*)    Hgb urine dipstick MODERATE (*)    Protein, ur 100 (*)    Leukocytes, UA LARGE (*)    All other components within normal limits  CBC - Abnormal; Notable for the following:    WBC 27.0 (*)    RBC 3.84 (*)    All other components within normal limits  COMPREHENSIVE METABOLIC PANEL - Abnormal; Notable for the following:  Potassium 5.5 (*)    Glucose, Bld 344 (*)    BUN 55 (*)    Creatinine, Ser 1.61 (*)    Albumin 3.3 (*)    GFR calc non Af Amer 28 (*)    GFR calc Af Amer 32 (*)    All other components within normal limits  URINE MICROSCOPIC-ADD ON - Abnormal; Notable for the following:    Bacteria, UA MANY (*)    All other components within normal limits  I-STAT CG4 LACTIC ACID, ED   Imaging Review No results found.   EKG Interpretation None      MDM   Final diagnoses:  Chronic kidney disease  Atrial fibrillation  Chronic systolic CHF (congestive heart failure)  Pacemaker  PAF (paroxysmal atrial fibrillation)  UTI (lower urinary tract infection)    Medications  sodium chloride 0.9 % bolus 1,000 mL (1,000 mLs Intravenous New Bag/Given 01/18/14 1937)  cefTRIAXone (ROCEPHIN) 1 g in dextrose 5 % 50 mL IVPB (0 g Intravenous Stopped 01/18/14 2033)  acetaminophen (TYLENOL) tablet 650 mg (650 mg Oral Given 01/18/14 2032)   Filed Vitals:   01/18/14 1817 01/18/14 1901 01/18/14 1915 01/18/14 2034    BP: 126/51 149/55 148/74 145/53  Pulse: 49 48 48   Temp: 98.6 F (37 C) 100 F (37.8 C)    TempSrc: Oral Rectal    Resp: 20 14 17 14   SpO2: 96% 100% 100% 100%   Patient presenting to the ED with urinary frequency and fever that has been ongoing for the past 2-3 weeks. Patient reported that she has been having chills and taking tylenol with minimal relief. Patient reported that she has been having lower back pain associated with mild suprapubic discomfort. Patient has strong cardiac history with syncope, pacemaker placement, chronic kidney disease. Alert and oriented. GCS 15. Heart rate and rhythm normal. Lungs clear to auscultation to upper and lower lobes bilaterally. Radial pulses 2+ bilaterally. Bowel sounds normal active in all 4 quadrants with discomfort upon palpation to the suprapubic region-benign abdominal exam. CBC noted elevated white blood cell count of 27.0. CMP noted elevated potassium of 5.5-hyperkalemia noted - when compared to previous labs this appears to be consistent and seen in the past. Elevated BUN and creatinine-BUN of 55, creatinine 1.61-creatinine is improved since one month ago, model elevated BUN noted secondary to dehydration. Lactic acid 1.4. Urinalysis noted moderate hemoglobin with large leukocytes and too numerous to count white blood cell. Patient mildly febrile upon arrival to the ED with a rectal temperature 100F. Patient mildly bradycardic-patient does take beta blockers-metoprolol. Blood pressure negative drop. Patient does not appear septic nor qualifies for sepsis criteria. Based on presentation, labs, urinary tract infection patient's history with dehydration-patient to be minutes the hospital. This provider spoke with hospitalist-patient to be admitted for observation to [place] floor regarding your nares tract infection with history of chronic kidney disease and heart conditions. Discussed plan for admission with patient-patient agrees and understands.  Patient stable for transfer.     Jamse Mead, PA-C 01/18/14 2324

## 2014-01-18 NOTE — H&P (Signed)
PCP:   [name], MD   Chief Complaint:  Weak, chills  HPI: 80 yo female h/o afib, ckd baseline cr 0.2-7, systolic chf ef approx 25%, was seeing her cardiologist today, when she mentioned how awful she had been feeling so he sent her to ED.  Pt has had over one week of worsening urinary frequency and incontinence.  Several days of chills, and now suprapubic pain.  No dysuria.  subj fevers.  No n/v.  She has been very weak and can barely get out of bed due to the weakness.  She lives with her husband, who is present with her in the ED.  Denies any dizziness or syncope.  She has not been on abx recently.  Review of Systems:  Positive and negative as per HPI otherwise all other systems are negative  Past Medical History: Past Medical History  Diagnosis Date  . Syncope   . Sinus node dysfunction   . S/P cardiac pacemaker procedure     Guidant  . Wide-complex tachycardia   . Cholelithiasis   . Glucose intolerance (impaired glucose tolerance)   . DJD (degenerative joint disease)   . Anxiety   . PONV (postoperative nausea and vomiting)   . Pacemaker   . Chronic kidney disease     frequency  . Breast cancer     left breast - hx of 2003   . Injury of right upper arm     after venipuncture in 3/13 - pt describes pain afterwards- no followup done seen by PCP- no further treatment   . PAF (paroxysmal atrial fibrillation)   . Left bundle branch block    Past Surgical History  Procedure Laterality Date  . S/p pacemaker    . Replacement total knee bilateral    . Total shoulder replacement    . Total hip arthroplasty    . Femur fracture surgery    . Hysterectomy, unspecified area    . Masterectomy    . Abdominal hysterectomy    . Breast surgery      left breast - 2003   . Total hip arthroplasty  06/16/2012    Procedure: TOTAL HIP ARTHROPLASTY;  Surgeon: Mauri Pole, MD;  Location: [facility];  Service: Orthopedics;  Laterality: Left;  . Hip closed reduction  06/30/2012    Procedure:  CLOSED MANIPULATION HIP;  Surgeon: Mauri Pole, MD;  Location: [facility];  Service: Orthopedics;  Laterality: Left;    Medications: Prior to Admission medications   Medication Sig Start Date End Date Taking? Authorizing Provider  acetaminophen (TYLENOL) 325 MG tablet Take 325 mg by mouth every 6 (six) hours as needed for pain.   Yes Historical Provider, MD  ALPRAZolam (XANAX) 0.25 MG tablet Take 1 tablet (0.25 mg total) by mouth 2 (two) times daily. 11/25/12  Yes Geradine Girt, DO  amiodarone (PACERONE) 200 MG tablet Take 1 tablet (200 mg total) by mouth daily. 12/15/13  Yes Brooke O Edmisten, PA-C  B Complex-C (B-COMPLEX WITH VITAMIN C) tablet Take 1 tablet by mouth daily.   Yes Historical Provider, MD  dabigatran (PRADAXA) 75 MG CAPS Take 75 mg by mouth 2 (two) times daily.  05/29/11  Yes Deboraha Sprang, MD  diclofenac sodium (VOLTAREN) 1 % GEL Apply 2 g topically 4 (four) times daily. 11/25/12  Yes Geradine Girt, DO  metoprolol (LOPRESSOR) 50 MG tablet Take 1 tablet (50 mg total) by mouth 2 (two) times daily. 12/01/13  Yes Deboraha Sprang, MD  pantoprazole (PROTONIX) 40 MG tablet Take 40 mg by mouth daily.   Yes Historical Provider, MD    Allergies:   Allergies  Allergen Reactions  . Codeine Nausea And Vomiting  . Other Nausea And Vomiting    Narcotics  dizziness    Social History:  reports that she quit smoking about 65 years ago. Her smoking use included Cigarettes. She smoked 0.00 packs per day. She has never used smokeless tobacco. She reports that she does not drink alcohol or use illicit drugs.  Family History: History reviewed. No pertinent family history.  Physical Exam: Filed Vitals:   01/18/14 1817 01/18/14 1901 01/18/14 1915 01/18/14 2034  BP: 126/51 149/55 148/74 145/53  Pulse: 49 48 48   Temp: 98.6 F (37 C) 100 F (37.8 C)    TempSrc: Oral Rectal    Resp: 20 14 17 14   SpO2: 96% 100% 100% 100%   General appearance: alert, cooperative and no distress Head:  Normocephalic, without obvious abnormality, atraumatic Eyes: negative Nose: Nares normal. Septum midline. Mucosa normal. No drainage or sinus tenderness. Neck: no JVD and supple, symmetrical, trachea midline Lungs: clear to auscultation bilaterally Heart: regular rate and rhythm, S1, S2 normal, no murmur, click, rub or gallop Abdomen: soft, non-tender; bowel sounds normal; no masses,  no organomegaly Extremities: extremities normal, atraumatic, no cyanosis or edema Pulses: 2+ and symmetric Skin: Skin color, texture, turgor normal. No rashes or lesions Neurologic: Grossly normal    Labs on Admission:   Recent Labs  01/18/14 1606  NA 137  K 5.5*  CL 100  CO2 20  GLUCOSE 344*  BUN 55*  CREATININE 1.61*  CALCIUM 8.9    Recent Labs  01/18/14 1606  AST 16  ALT 25  ALKPHOS 91  BILITOT 0.6  PROT 6.0  ALBUMIN 3.3*    Recent Labs  01/18/14 1606  WBC 27.0*  HGB 12.0  HCT 36.0  MCV 93.8  PLT 276    Radiological Exams on Admission: No results found.  Assessment/Plan  80 yo female with generalized weakness and uti  Principal Problem:   UTI (lower urinary tract infection)-  Rocephin.  obs overnight for gentle ivf.  PT eval tomorrow, probably could beneifit some HH/PT at home.  Vss, is bradycardic from meds, consider backing off on some cardiac meds if her weakness does not resolve.  Suspect this is due to the active infection.  She should improve with abx and ivf.  Possible d/c tomorrow afternoon.  Active Problems:   CKD (chronic kidney disease), stage III-  Actually better than her baseline   Pacemaker     PAF (paroxysmal atrial fibrillation)   Chronic systolic CHF (congestive heart failure)   General weakness    Ryu Cerreta A 01/18/2014, 8:35 PM

## 2014-01-19 DIAGNOSIS — A419 Sepsis, unspecified organism: Secondary | ICD-10-CM | POA: Diagnosis present

## 2014-01-19 LAB — HEMOGLOBIN A1C
Hgb A1c MFr Bld: 9.2 % — ABNORMAL HIGH (ref <5.7)
Mean Plasma Glucose: 217 mg/dL — ABNORMAL HIGH (ref <117)

## 2014-01-19 LAB — BASIC METABOLIC PANEL
BUN: 47 mg/dL — ABNORMAL HIGH (ref 6–23)
CHLORIDE: 107 meq/L (ref 96–112)
CO2: 18 meq/L — AB (ref 19–32)
Calcium: 8.3 mg/dL — ABNORMAL LOW (ref 8.4–10.5)
Creatinine, Ser: 1.42 mg/dL — ABNORMAL HIGH (ref 0.50–1.10)
GFR calc Af Amer: 37 mL/min — ABNORMAL LOW (ref >90)
GFR calc non Af Amer: 32 mL/min — ABNORMAL LOW (ref >90)
Glucose, Bld: 160 mg/dL — ABNORMAL HIGH (ref 70–99)
Potassium: 5.2 mEq/L (ref 3.7–5.3)
SODIUM: 141 meq/L (ref 137–147)

## 2014-01-19 LAB — CBC
HEMATOCRIT: 32.6 % — AB (ref 36.0–46.0)
Hemoglobin: 11 g/dL — ABNORMAL LOW (ref 12.0–15.0)
MCH: 31.3 pg (ref 26.0–34.0)
MCHC: 33.7 g/dL (ref 30.0–36.0)
MCV: 92.6 fL (ref 78.0–100.0)
Platelets: 222 10*3/uL (ref 150–400)
RBC: 3.52 MIL/uL — AB (ref 3.87–5.11)
RDW: 15.4 % (ref 11.5–15.5)
WBC: 19.9 10*3/uL — AB (ref 4.0–10.5)

## 2014-01-19 LAB — GLUCOSE, CAPILLARY
Glucose-Capillary: 144 mg/dL — ABNORMAL HIGH (ref 70–99)
Glucose-Capillary: 149 mg/dL — ABNORMAL HIGH (ref 70–99)
Glucose-Capillary: 131 mg/dL — ABNORMAL HIGH (ref 70–99)
Glucose-Capillary: 230 mg/dL — ABNORMAL HIGH (ref 70–99)

## 2014-01-19 NOTE — Progress Notes (Signed)
Utilization review completed.  

## 2014-01-19 NOTE — Progress Notes (Signed)
[facility] PROGRESS NOTE Interim History: 80 yo female h/o afib, ckd baseline cr 6.9-6, systolic chf ef approx 78%, was seeing her cardiologist today, when she mentioned how awful she had been feeling so he sent her to ED. Pt has had over one week of worsening urinary frequency and incontinence. Several days of chills, and now suprapubic pain. No dysuria. subj fevers with U/A significant for UTI.    Assessment/Plan: Sepsis due to UTI: - Bp has remain stable. - leukocytosis improving - tachycardia improved.   *UTI (lower urinary tract infection) - started on rocephin 3.17.2015. - U.C. Pending.  - cr at baseline.  Hyperkalemia: - Now improved   Chronic systolic CHF (congestive heart failure) - Euvolemic. - KVO IV fluids  CKD (chronic kidney disease), stage III: - cr at baseline.  PAF (paroxysmal atrial fibrillation)/  Pacemaker - rate controlled. - cont amio and metoprolol.     Code Status: Full Family Communication: none  Disposition Plan: inpatient   Consultants:  none  Procedures:  CXR  Antibiotics:  Rocephin 3.17.2015.  HPI/Subjective: No complains, still feels lousy.  Objective: Filed Vitals:   01/18/14 2143 01/18/14 2146 01/18/14 2209 01/19/14 0527  BP: 117/62  119/74 118/71  Pulse:   92 90  Temp:  98.5 F (36.9 C) 97.4 F (36.3 C) 97.4 F (36.3 C)  TempSrc:  Oral Oral Oral  Resp: 15  16 19   Height:   5' 1.5" (1.562 m)   Weight:   51.755 kg (114 lb 1.6 oz) 52.254 kg (115 lb 3.2 oz)  SpO2: 95%  99% 99%    Intake/Output Summary (Last 24 hours) at 01/19/14 1002 Last data filed at 01/19/14 0912  Gross per 24 hour  Intake  737.5 ml  Output      0 ml  Net  737.5 ml   Filed Weights   01/18/14 2209 01/19/14 0527  Weight: 51.755 kg (114 lb 1.6 oz) 52.254 kg (115 lb 3.2 oz)    Exam:  General: Alert, awake, oriented x3, in no acute distress.  HEENT: No bruits, no goiter.  Heart: Regular rate and rhythm,  Lungs: Good air  movement, clear  Abdomen: Soft, nontender, nondistended, positive bowel sounds.     Data Reviewed: Basic Metabolic Panel:  Recent Labs Lab 01/18/14 1606 01/19/14 0345  NA 137 141  K 5.5* 5.2  CL 100 107  CO2 20 18*  GLUCOSE 344* 160*  BUN 55* 47*  CREATININE 1.61* 1.42*  CALCIUM 8.9 8.3*   Liver Function Tests:  Recent Labs Lab 01/18/14 1606  AST 16  ALT 25  ALKPHOS 91  BILITOT 0.6  PROT 6.0  ALBUMIN 3.3*   No results found for this basename: LIPASE, AMYLASE,  in the last 168 hours No results found for this basename: AMMONIA,  in the last 168 hours CBC:  Recent Labs Lab 01/18/14 1606 01/19/14 0345  WBC 27.0* 19.9*  HGB 12.0 11.0*  HCT 36.0 32.6*  MCV 93.8 92.6  PLT 276 222   Cardiac Enzymes: No results found for this basename: CKTOTAL, CKMB, CKMBINDEX, TROPONINI,  in the last 168 hours BNP (last 3 results)  Recent Labs  12/06/13 1129  PROBNP 200.0*   CBG:  Recent Labs Lab 01/18/14 2205 01/19/14 0548  GLUCAP 211* 144*    No results found for this or any previous visit (from the past 240 hour(s)).   Studies: No results found.  Scheduled Meds: . ALPRAZolam  0.25 mg Oral BID  . amiodarone  200 mg Oral Daily  . B-complex with vitamin C  1 tablet Oral Daily  . cefTRIAXone (ROCEPHIN)  IV  1 g Intravenous Q24H  . dabigatran  75 mg Oral BID  . insulin aspart  0-5 Units Subcutaneous QHS  . insulin aspart  0-9 Units Subcutaneous TID WC  . metoprolol  50 mg Oral BID  . pantoprazole  40 mg Oral Daily   Continuous Infusions: . sodium chloride 75 mL/hr at 01/18/14 2239     Charlynne Cousins  [facility] Pager [phone]. If 8PM-8AM, please contact night-coverage at [email or web address], password [facility] 01/19/2014, 10:02 AM  LOS: 1 day

## 2014-01-19 NOTE — Progress Notes (Signed)
Patient alert oriented , no c/o pain or shortness of breath. Patient is incontinent of urin. Will continue to monitor.

## 2014-01-19 NOTE — Progress Notes (Signed)
Patient evaluated for community based chronic disease management services with [facility] as a benefit of patient's [employer]. Spoke with patient and spouse at bedside to explain [facility] services.  Services have been accepted with written consents.  Patient is a new onset diabetic.  Patient will receive a post discharge transition of care call and will be evaluated for monthly home visits for assessments and Diabetes disease process education.  Left contact information and [facility] literature at bedside. Made Inpatient Case Manager aware that [place] [facility] following. Of note, [facility] Care Management services does not replace or interfere with any services that are arranged by inpatient case management or social work.  For additional questions or referrals please contact Corliss Blacker BSN RN [facility] Liaison at [phone].

## 2014-01-19 NOTE — Evaluation (Signed)
Physical Therapy Evaluation Patient Details Name: Sophia Oliver MRN: [id number] DOB: Mar 18, 1924 Today's Date: 01/19/2014 Time: 3976-7341 PT Time Calculation (min): 31 min  PT Assessment / Plan / Recommendation History of Present Illness  80 yo female h/o afib, ckd baseline cr 9.3-7, systolic chf ef approx 90%, was seeing her cardiologist today, when she mentioned how awful she had been feeling so he sent her to ED.  Pt has had over one week of worsening urinary frequency and incontinence.  Several days of chills, and now suprapubic pain.  No dysuria.  subj fevers.  No n/v.  She has been very weak and can barely get out of bed due to the weakness.  She lives with her husband, who is present with her in the ED.   Clinical Impression  Pt admitted with UTI. Pt currently with functional limitations due to the deficits listed below (see PT Problem List).  Pt will benefit from skilled PT to increase their independence and safety with mobility to allow discharge to the venue listed below. Pt is very motivated to work with PT and would like continued PT upon d/c from acute care to address R knee weakness.  Pt reports 3 falls in last 2 weeks where she fell on R knee and it is now hurting at 7/10 level.      PT Assessment  Patient needs continued PT services    Follow Up Recommendations  Home health PT    Does the patient have the potential to tolerate intense rehabilitation      Barriers to Discharge        Equipment Recommendations  None recommended by PT    Recommendations for Other Services     Frequency Min 4X/week    Precautions / Restrictions Precautions Precautions: Fall Precaution Comments: Pt reports 3 falls in last 2 weeks with R LE buckeling and falling on R knee.   Pertinent Vitals/Pain 7/10 R knee      Mobility  Bed Mobility General bed mobility comments: Pt up in chair upon arrival. Transfers Overall transfer level: Needs assistance Equipment used: Rolling walker (2  wheeled) Transfers: Sit to/from Stand Sit to Stand: Min guard General transfer comment: cues for proper form Ambulation/Gait Ambulation/Gait assistance: Min guard Ambulation Distance (Feet): 135 Feet Assistive device: Rolling walker (2 wheeled) Gait Pattern/deviations: Decreased step length - left;Antalgic General Gait Details: Pt with decreased L step lengthe due to R knee pain.    Exercises General Exercises - Lower Extremity Ankle Circles/Pumps: AROM;Both;20 reps;Seated Long Arc Quad: AROM;Both;10 reps;Seated Hip ABduction/ADduction: AAROM;5 reps;Seated Hip Flexion/Marching: AROM;Both;10 reps   PT Diagnosis: Difficulty walking;Acute pain;Generalized weakness  PT Problem List: Decreased strength;Decreased range of motion;Decreased activity tolerance;Pain;Decreased mobility PT Treatment Interventions: Gait training;Functional mobility training;Therapeutic activities;Therapeutic exercise;DME instruction;Balance training;Patient/family education     PT Goals(Current goals can be found in the care plan section) Acute Rehab PT Goals Patient Stated Goal: To get R knee stronger so it won't give way.  To get more PTafter d/c from hospital. PT Goal Formulation: With patient Time For Goal Achievement: 02/02/14 Potential to Achieve Goals: Good  Visit Information  Last PT Received On: 01/19/14 History of Present Illness: 80 yo female h/o afib, ckd baseline cr 2.4-0, systolic chf ef approx 97%, was seeing her cardiologist today, when she mentioned how awful she had been feeling so he sent her to ED.  Pt has had over one week of worsening urinary frequency and incontinence.  Several days of chills, and now suprapubic pain.  No dysuria.  subj fevers.  No n/v.  She has been very weak and can barely get out of bed due to the weakness.  She lives with her husband, who is present with her in the ED.        Prior [place] expects to be discharged to:: Private  residence Living Arrangements: Spouse/significant other Available Help at Discharge: Available 24 hours/day;Family Type of Home: House Home Access: Stairs to enter [employer] of Steps: 1 Home Layout: Two level;1/2 bath on main level;Able to live on main level with bedroom/bathroom Home Equipment: Gilford Rile - 2 wheels;Bedside commode;Shower seat Prior Function Level of Independence: Independent with assistive device(s) Comments: Amb with RW. stays downstairs except 3-4 days/week for shower.  Husband S with stairs. Communication Communication: No difficulties    Cognition  Cognition Arousal/Alertness: Awake/alert Behavior During Therapy: WFL for tasks assessed/performed Overall Cognitive Status: Within Functional Limits for tasks assessed    Extremity/Trunk Assessment Lower Extremity Assessment Lower Extremity Assessment: RLE deficits/detail;LLE deficits/detail RLE Deficits / Details: hip flex and quads 3+/5.  Bruising and some swelling visible. LLE Deficits / Details: hip flex and quads 3+/5   Balance Balance Overall balance assessment: Needs assistance Sitting balance-Leahy Scale: Good Standing balance-Leahy Scale: Poor General Comments General comments (skin integrity, edema, etc.): Pt concerned about R knee pain.  End of Session PT - End of Session Equipment Utilized During Treatment: Gait belt Activity Tolerance: Patient tolerated treatment well Patient left: in chair Nurse Communication: Patient requests pain meds  GP     Musa,Jourdyn Ferrin LUBECK 01/19/2014, 2:01 PM

## 2014-01-20 DIAGNOSIS — A419 Sepsis, unspecified organism: Secondary | ICD-10-CM

## 2014-01-20 DIAGNOSIS — E875 Hyperkalemia: Secondary | ICD-10-CM

## 2014-01-20 LAB — GLUCOSE, CAPILLARY
Glucose-Capillary: 194 mg/dL — ABNORMAL HIGH (ref 70–99)
Glucose-Capillary: 196 mg/dL — ABNORMAL HIGH (ref 70–99)
Glucose-Capillary: 228 mg/dL — ABNORMAL HIGH (ref 70–99)
Glucose-Capillary: 115 mg/dL — ABNORMAL HIGH (ref 70–99)

## 2014-01-20 MED ORDER — ENSURE PUDDING PO PUDG
1.0000 | Freq: Three times a day (TID) | ORAL | Status: DC
  Administered 2014-01-20 – 2014-01-22 (×9): 1 via ORAL

## 2014-01-20 MED ORDER — INSULIN DETEMIR 100 UNIT/ML ~~LOC~~ SOLN
5.0000 [IU] | Freq: Two times a day (BID) | SUBCUTANEOUS | Status: DC
  Administered 2014-01-20 (×2): 5 [IU] via SUBCUTANEOUS
  Filled 2014-01-20 (×4): qty 0.05

## 2014-01-20 NOTE — Progress Notes (Signed)
Physical Therapy Treatment Patient Details Name: Sophia Oliver MRN: [id number] DOB: 09/27/1924 Today's Date: 01/20/2014 Time: 1203-1230 PT Time Calculation (min): 27 min  PT Assessment / Plan / Recommendation  History of Present Illness 80 yo female h/o afib, ckd baseline cr 0.3-4, systolic chf ef approx 74%, was seeing her cardiologist today, when she mentioned how awful she had been feeling so he sent her to ED.  Pt has had over one week of worsening urinary frequency and incontinence.  Several days of chills, and now suprapubic pain.  No dysuria.  subj fevers.  No n/v.  She has been very weak and can barely get out of bed due to the weakness.  She lives with her husband, who is present with her in the ED.    PT Comments   Pt in chair on arrival very pleasant and eager to move. Pt walked from her room down the hall to stairwell without difficulty and cueing for RW use. In stairwell pt reported feeling weak and needing to sit. Pt sat on step and then reported feeling her heart "running away" and light headed. Checked pulse with HR 40 and sats 90% on RA. Requested chair from staff and assisted pt to stand and sit in chair and transported pt back to room with return to bed. Pt responsive throughout but difficulty opening eyes. On return to bed checked BP 69/43 with pt placed in trendelenburg. RN present and assessing and Dr.Feliz Olevia Bowens present end of session. Session limited by presyncope and hypotension. Will continue to follow.   Follow Up Recommendations  Home health PT     Does the patient have the potential to tolerate intense rehabilitation     Barriers to Discharge        Equipment Recommendations       Recommendations for Other Services    Frequency Min 3X/week   Progress towards PT Goals Progress towards PT goals: Not progressing toward goals - comment (presyncope in session with hypotension)  Plan Discharge plan needs to be updated;Frequency needs to be updated    Precautions /  Restrictions Precautions Precautions: Fall Precaution Comments: Pt reports 3 falls in last 2 weeks with R LE buckeling and falling on R knee. Presyncope during session today with need for transfer back to room via chair and return to bed with hypotension   Pertinent Vitals/Pain HR 40 in stairwell and on return to room with elevation to 72 in trendelenburg BP 69/43 on initial return to room and after grossly 5 min 130/76 sats 100% on 3L  No pain    Mobility  Bed Mobility General bed mobility comments: max assist +2 back to bed due to hypotension and limited ability to assist due to fatigue and presyncope Transfers Transfers: Sit to/from Stand Sit to Stand: Max assist;Supervision;+2 physical assistance General transfer comment: pt able to stand from recliner with supervision for hand placement only. From step required max assist adn from chair on return to room max assist with 2 person assist Ambulation/Gait Ambulation/Gait assistance: Supervision Ambulation Distance (Feet): 170 Feet Assistive device: Rolling walker (2 wheeled) Gait Pattern/deviations: Step-through pattern;Decreased stride length General Gait Details: cues for posture and position in RW    Exercises     PT Diagnosis:    PT Problem List:   PT Treatment Interventions:     PT Goals (current goals can now be found in the care plan section)    Visit Information  Last PT Received On: 01/20/14 Assistance Needed: +1 History of  Present Illness: 80 yo female h/o afib, ckd baseline cr 3.5-4, systolic chf ef approx 56%, was seeing her cardiologist today, when she mentioned how awful she had been feeling so he sent her to ED.  Pt has had over one week of worsening urinary frequency and incontinence.  Several days of chills, and now suprapubic pain.  No dysuria.  subj fevers.  No n/v.  She has been very weak and can barely get out of bed due to the weakness.  She lives with her husband, who is present with her in the ED.      Subjective Data      Cognition  Cognition Arousal/Alertness: Awake/alert Behavior During Therapy: WFL for tasks assessed/performed Overall Cognitive Status: Within Functional Limits for tasks assessed    Balance     End of Session PT - End of Session Equipment Utilized During Treatment: Gait belt Activity Tolerance: Treatment limited secondary to medical complications (Comment) Patient left: in bed;with call bell/phone within reach;with nursing/sitter in room Nurse Communication: Mobility status   GP     Melford Aase 01/20/2014, 12:49 PM Elwyn Reach, [place]

## 2014-01-20 NOTE — Progress Notes (Signed)
Called to pt's bedside stat per secretary. When in bedside pt found in chair with Physical therapist steadying patient. Pt slow to respond, and unable to stand and transfer self to bed from chair, needed assistancex3. Pt settled in bed, VSS taken noted pt to have low BP and HR in the 40's with O2 sat in the 80's. Pt placed on 2L O2. MD Feliz called to bedside. Vital signs rechecked after 5 mins with BP and HR WNL (see doc flow sheet for details). Will continue to monitor.

## 2014-01-20 NOTE — Progress Notes (Signed)
Inpatient Diabetes Program Recommendations  AACE/ADA: New Consensus Statement on Inpatient Glycemic Control (2013)  Target Ranges:  Prepandial:   less than 140 mg/dL      Peak postprandial:   less than 180 mg/dL (1-2 hours)      Critically ill patients:  140 - 180 mg/dL   Pt with no documented hx of DM on admission. HgbA1C this visit is 9.2%. However with Hgb/Hct running low with CRF and CHF, this may not be accurate. But with this high an A1C, recommend that pt be discharged with oral agent to start and nutrition education to help with diabetes control.  Noted pt can be followed at [facility] Concern that UTI may be result of hyperglycemia or vice-versa.  Agree with start of Levemir at 5 units. Noted pt lives with spouse at home. May need some [place] to assist with nutritional needs as well as any other needs. Please assure that pt has been told that she has DM indicated by the most recent HgbA1C. Then we can order basic in-pt education using videos, printed materials and nutrition consult to assist with basic dietary needs. Thank you, Rosita Kea, RN, CNS, Diabetes Coordinator [phone])  Thank you, Rosita Kea, RN, CNS, Diabetes Coordinator [phone])

## 2014-01-20 NOTE — Progress Notes (Signed)
Per day shift RN pt has been non-tele all day/this admission. Pt had episode of bradycardia this afternoon when ambulating with PT. Day shift RN states that telemetry orders were given, but cancelled. Pt currently not on telemetry. MD on call Tylene Fantasia notified and telemetry order d/c.  Ronnette Hila, RN

## 2014-01-20 NOTE — Progress Notes (Signed)
I Cosign with Demarcus Steward on all assessments, documentation and medication administration for this shift. Collen Hostler A, RN  

## 2014-01-20 NOTE — Progress Notes (Signed)
[facility] PROGRESS NOTE Interim History: 80 yo female h/o afib, ckd baseline cr 3.1-5, systolic chf ef approx 40%, was seeing her cardiologist today, when she mentioned how awful she had been feeling so he sent her to ED. Pt has had over one week of worsening urinary frequency and incontinence. Several days of chills, and now suprapubic pain. No dysuria. subj fevers with U/A significant for UTI.  Assessment/Plan: Sepsis due to UTI: - Bp has remain stable. - resolved with IV antibiotics and IV Fluids.   *UTI (lower urinary tract infection) - Started on rocephin 3.17.2015. - U.C. Pending.  - Cr at baseline.  Hyperkalemia: - Now improved   Chronic systolic CHF (congestive heart failure) - Euvolemic. - KVO IV fluids  CKD (chronic kidney disease), stage III: - cr at baseline.  PAF (paroxysmal atrial fibrillation)/  Pacemaker - rate controlled. - cont Amio and metoprolol.     Code Status: Full Family Communication: none  Disposition Plan: inpatient   Consultants:  none  Procedures:  CXR  Antibiotics:  Rocephin 3.17.2015.  HPI/Subjective: No complains. Feels better.  Objective: Filed Vitals:   01/19/14 1451 01/19/14 2011 01/20/14 0632 01/20/14 0802  BP:  128/74 132/78 108/66  Pulse:  96 95 76  Temp: 97.7 F (36.5 C) 97.8 F (36.6 C) 97.9 F (36.6 C) 97.6 F (36.4 C)  TempSrc:  Oral Oral Oral  Resp:  18 18 18   Height:      Weight:   51.4 kg (113 lb 5.1 oz)   SpO2:  100% 98% 92%    Intake/Output Summary (Last 24 hours) at 01/20/14 0821 Last data filed at 01/19/14 2121  Gross per 24 hour  Intake    600 ml  Output      0 ml  Net    600 ml   Filed Weights   01/18/14 2209 01/19/14 0527 01/20/14 0867  Weight: 51.755 kg (114 lb 1.6 oz) 52.254 kg (115 lb 3.2 oz) 51.4 kg (113 lb 5.1 oz)    Exam:  General: Alert, awake, oriented x3, in no acute distress.  HEENT: No bruits, no goiter.  Heart: Regular rate and rhythm,  Lungs: Good air  movement, clear  Abdomen: Soft, nontender, nondistended, positive bowel sounds.     Data Reviewed: Basic Metabolic Panel:  Recent Labs Lab 01/18/14 1606 01/19/14 0345  NA 137 141  K 5.5* 5.2  CL 100 107  CO2 20 18*  GLUCOSE 344* 160*  BUN 55* 47*  CREATININE 1.61* 1.42*  CALCIUM 8.9 8.3*   Liver Function Tests:  Recent Labs Lab 01/18/14 1606  AST 16  ALT 25  ALKPHOS 91  BILITOT 0.6  PROT 6.0  ALBUMIN 3.3*   No results found for this basename: LIPASE, AMYLASE,  in the last 168 hours No results found for this basename: AMMONIA,  in the last 168 hours CBC:  Recent Labs Lab 01/18/14 1606 01/19/14 0345  WBC 27.0* 19.9*  HGB 12.0 11.0*  HCT 36.0 32.6*  MCV 93.8 92.6  PLT 276 222   Cardiac Enzymes: No results found for this basename: CKTOTAL, CKMB, CKMBINDEX, TROPONINI,  in the last 168 hours BNP (last 3 results)  Recent Labs  12/06/13 1129  PROBNP 200.0*   CBG:  Recent Labs Lab 01/19/14 0548 01/19/14 1122 01/19/14 1630 01/19/14 2045 01/20/14 0603  GLUCAP 144* 149* 131* 230* 194*    Recent Results (from the past 240 hour(s))  CULTURE, BLOOD (ROUTINE X 2)     Status: None  Collection Time    01/19/14 10:10 AM      Result Value Ref Range Status   Specimen Description BLOOD RIGHT HAND   Final   Special Requests BOTTLES DRAWN AEROBIC ONLY 8CC   Final   Culture  Setup Time     Final   Value: 01/19/2014 14:32     Performed at [facility]   Culture     Final   Value:        BLOOD CULTURE RECEIVED NO GROWTH TO DATE CULTURE WILL BE HELD FOR 5 DAYS BEFORE ISSUING A FINAL NEGATIVE REPORT     Performed at [facility]   Report Status PENDING   Incomplete  CULTURE, BLOOD (ROUTINE X 2)     Status: None   Collection Time    01/19/14 10:20 AM      Result Value Ref Range Status   Specimen Description BLOOD RIGHT ANTECUBITAL   Final   Special Requests BOTTLES DRAWN AEROBIC ONLY 10CC   Final   Culture  Setup Time     Final   Value:  01/19/2014 14:32     Performed at [facility]   Culture     Final   Value:        BLOOD CULTURE RECEIVED NO GROWTH TO DATE CULTURE WILL BE HELD FOR 5 DAYS BEFORE ISSUING A FINAL NEGATIVE REPORT     Performed at [facility]   Report Status PENDING   Incomplete     Studies: No results found.  Scheduled Meds: . ALPRAZolam  0.25 mg Oral BID  . amiodarone  200 mg Oral Daily  . B-complex with vitamin C  1 tablet Oral Daily  . cefTRIAXone (ROCEPHIN)  IV  1 g Intravenous Q24H  . dabigatran  75 mg Oral BID  . insulin aspart  0-5 Units Subcutaneous QHS  . insulin aspart  0-9 Units Subcutaneous TID WC  . insulin detemir  5 Units Subcutaneous BID  . metoprolol  50 mg Oral BID  . pantoprazole  40 mg Oral Daily   Continuous Infusions:     Charlynne Cousins  [facility] Pager [phone]. If 8PM-8AM, please contact night-coverage at [email or web address], password [facility] 01/20/2014, 8:21 AM  LOS: 2 days

## 2014-01-20 NOTE — Progress Notes (Addendum)
Dual chamber pacemaker check.  Ventricular lead impedance at >2500 for at least the past year.  This is known to his following doctor and clinic.  She was last checked in the office on December 01, 2013.  No new episodes to report.  Atrial lead function is normal:  Sensing 0.9mV, impedance 380 ohms, threshold 1.0V@0 .32ms.  Mode is DDI 40.  Current rhythm is sinus at 70.   [employer] [phone]  Vendor unable to have access to chart, placed report in paper chart. Ronnette Hila, RN

## 2014-01-21 DIAGNOSIS — R55 Syncope and collapse: Secondary | ICD-10-CM

## 2014-01-21 DIAGNOSIS — E119 Type 2 diabetes mellitus without complications: Secondary | ICD-10-CM

## 2014-01-21 LAB — GLUCOSE, CAPILLARY
Glucose-Capillary: 188 mg/dL — ABNORMAL HIGH (ref 70–99)
Glucose-Capillary: 153 mg/dL — ABNORMAL HIGH (ref 70–99)
Glucose-Capillary: 212 mg/dL — ABNORMAL HIGH (ref 70–99)
Glucose-Capillary: 236 mg/dL — ABNORMAL HIGH (ref 70–99)

## 2014-01-21 LAB — BASIC METABOLIC PANEL
BUN: 44 mg/dL — AB (ref 6–23)
CALCIUM: 8.3 mg/dL — AB (ref 8.4–10.5)
CO2: 19 mEq/L (ref 19–32)
Chloride: 104 mEq/L (ref 96–112)
Creatinine, Ser: 1.29 mg/dL — ABNORMAL HIGH (ref 0.50–1.10)
GFR calc non Af Amer: 36 mL/min — ABNORMAL LOW (ref >90)
GFR, EST AFRICAN AMERICAN: 42 mL/min — AB (ref >90)
Glucose, Bld: 212 mg/dL — ABNORMAL HIGH (ref 70–99)
POTASSIUM: 5.4 meq/L — AB (ref 3.7–5.3)
Sodium: 136 mEq/L — ABNORMAL LOW (ref 137–147)

## 2014-01-21 MED ORDER — LIVING WELL WITH DIABETES BOOK
Freq: Once | Status: AC
  Administered 2014-01-21: 12:00:00
  Filled 2014-01-21: qty 1

## 2014-01-21 MED ORDER — GLIPIZIDE 2.5 MG HALF TABLET
2.5000 mg | ORAL_TABLET | Freq: Every day | ORAL | Status: DC
  Administered 2014-01-22: 2.5 mg via ORAL
  Filled 2014-01-21 (×2): qty 1

## 2014-01-21 MED ORDER — SODIUM POLYSTYRENE SULFONATE 15 GM/60ML PO SUSP
15.0000 g | Freq: Once | ORAL | Status: AC
  Administered 2014-01-21: 15 g via ORAL
  Filled 2014-01-21: qty 60

## 2014-01-21 MED ORDER — INSULIN DETEMIR 100 UNIT/ML ~~LOC~~ SOLN
10.0000 [IU] | Freq: Two times a day (BID) | SUBCUTANEOUS | Status: DC
  Administered 2014-01-21 – 2014-01-23 (×5): 10 [IU] via SUBCUTANEOUS
  Filled 2014-01-21 (×6): qty 0.1

## 2014-01-21 MED ORDER — SODIUM CHLORIDE 0.9 % IV SOLN
INTRAVENOUS | Status: AC
  Administered 2014-01-21: 08:00:00 via INTRAVENOUS

## 2014-01-21 NOTE — Progress Notes (Addendum)
[facility] PROGRESS NOTE Interim History: 80 yo female h/o afib, ckd baseline cr 1.7-9, systolic chf ef approx 15%, was seeing her cardiologist today, when she mentioned how awful she had been feeling so he sent her to ED. Pt has had over one week of worsening urinary frequency and incontinence. Several days of chills, and now suprapubic pain. No dysuria. subj fevers with U/A significant for UTI.  Assessment/Plan: Sepsis due to UTI: - Bp has remain stable. - Resolved with IV antibiotics and IV Fluids.  UTI (lower urinary tract infection) - Started on rocephin 3.17.2015. - U.C. Pending. UC obtain after antibiotics given. - Cr at baseline.  Newly diagnose DM: - start glipizide. - HbgA1c 9.2, DM education.  Near syncope: - no events on pace interrogation. - none focal physical exam, Blood Glucose >100,Bp check after episode and was 80 with HR of 40's.  Hyperkalemia: - Kayexalate. - b-met am   Chronic systolic CHF (congestive heart failure) - Euvolemic. - KVO IV fluids  CKD (chronic kidney disease), stage III: - cr at baseline.  PAF (paroxysmal atrial fibrillation)/  Pacemaker - rate controlled. - cont Amio and metoprolol.     Code Status: Full Family Communication: none  Disposition Plan: inpatient   Consultants:  none  Procedures:  CXR  Antibiotics:  Rocephin 3.17.2015.  HPI/Subjective: No complains. Feels better.  Objective: Filed Vitals:   01/20/14 2041 01/20/14 2110 01/21/14 0439 01/21/14 0608  BP: 116/71   121/63  Pulse: 86 89  79  Temp: 97.9 F (36.6 C)   98 F (36.7 C)  TempSrc: Oral   Oral  Resp: 16   17  Height:      Weight:   51.256 kg (113 lb)   SpO2: 99%   98%    Intake/Output Summary (Last 24 hours) at 01/21/14 0824 Last data filed at 01/20/14 2117  Gross per 24 hour  Intake    677 ml  Output      0 ml  Net    677 ml   Filed Weights   01/19/14 0527 01/20/14 0632 01/21/14 0439  Weight: 52.254 kg (115 lb 3.2 oz)  51.4 kg (113 lb 5.1 oz) 51.256 kg (113 lb)    Exam:  General: Alert, awake, oriented x3, in no acute distress.  HEENT: No bruits, no goiter.  Heart: Regular rate and rhythm,  Lungs: Good air movement, clear  Abdomen: Soft, nontender, nondistended, positive bowel sounds.     Data Reviewed: Basic Metabolic Panel:  Recent Labs Lab 01/18/14 1606 01/19/14 0345 01/21/14 0550  NA 137 141 136*  K 5.5* 5.2 5.4*  CL 100 107 104  CO2 20 18* 19  GLUCOSE 344* 160* 212*  BUN 55* 47* 44*  CREATININE 1.61* 1.42* 1.29*  CALCIUM 8.9 8.3* 8.3*   Liver Function Tests:  Recent Labs Lab 01/18/14 1606  AST 16  ALT 25  ALKPHOS 91  BILITOT 0.6  PROT 6.0  ALBUMIN 3.3*   No results found for this basename: LIPASE, AMYLASE,  in the last 168 hours No results found for this basename: AMMONIA,  in the last 168 hours CBC:  Recent Labs Lab 01/18/14 1606 01/19/14 0345  WBC 27.0* 19.9*  HGB 12.0 11.0*  HCT 36.0 32.6*  MCV 93.8 92.6  PLT 276 222   Cardiac Enzymes: No results found for this basename: CKTOTAL, CKMB, CKMBINDEX, TROPONINI,  in the last 168 hours BNP (last 3 results)  Recent Labs  12/06/13 1129  PROBNP 200.0*  CBG:  Recent Labs Lab 01/20/14 0603 01/20/14 1107 01/20/14 1617 01/20/14 2101 01/21/14 0601  GLUCAP 194* 196* 228* 115* 188*    Recent Results (from the past 240 hour(s))  CULTURE, BLOOD (ROUTINE X 2)     Status: None   Collection Time    01/19/14 10:10 AM      Result Value Ref Range Status   Specimen Description BLOOD RIGHT HAND   Final   Special Requests BOTTLES DRAWN AEROBIC ONLY 8CC   Final   Culture  Setup Time     Final   Value: 01/19/2014 14:32     Performed at [facility]   Culture     Final   Value:        BLOOD CULTURE RECEIVED NO GROWTH TO DATE CULTURE WILL BE HELD FOR 5 DAYS BEFORE ISSUING A FINAL NEGATIVE REPORT     Performed at [facility]   Report Status PENDING   Incomplete  CULTURE, BLOOD (ROUTINE X 2)      Status: None   Collection Time    01/19/14 10:20 AM      Result Value Ref Range Status   Specimen Description BLOOD RIGHT ANTECUBITAL   Final   Special Requests BOTTLES DRAWN AEROBIC ONLY 10CC   Final   Culture  Setup Time     Final   Value: 01/19/2014 14:32     Performed at [facility]   Culture     Final   Value:        BLOOD CULTURE RECEIVED NO GROWTH TO DATE CULTURE WILL BE HELD FOR 5 DAYS BEFORE ISSUING A FINAL NEGATIVE REPORT     Performed at [facility]   Report Status PENDING   Incomplete  URINE CULTURE     Status: None   Collection Time    01/19/14  2:44 PM      Result Value Ref Range Status   Specimen Description URINE, RANDOM   Final   Special Requests NONE   Final   Culture  Setup Time     Final   Value: 01/19/2014 19:39     Performed at [place] PENDING   Incomplete   Culture     Final   Value: Culture reincubated for better growth     Performed at [facility]   Report Status PENDING   Incomplete     Studies: No results found.  Scheduled Meds: . ALPRAZolam  0.25 mg Oral BID  . amiodarone  200 mg Oral Daily  . B-complex with vitamin C  1 tablet Oral Daily  . cefTRIAXone (ROCEPHIN)  IV  1 g Intravenous Q24H  . dabigatran  75 mg Oral BID  . feeding supplement (ENSURE)  1 Container Oral TID BM  . insulin aspart  0-5 Units Subcutaneous QHS  . insulin aspart  0-9 Units Subcutaneous TID WC  . insulin detemir  10 Units Subcutaneous BID  . metoprolol  50 mg Oral BID  . pantoprazole  40 mg Oral Daily   Continuous Infusions: . sodium chloride 75 mL/hr at 01/21/14 [place], Sophia Oliver  [facility] Pager [phone]. If 8PM-8AM, please contact night-coverage at [email or web address], password [facility] 01/21/2014, 8:24 AM  LOS: 3 days

## 2014-01-21 NOTE — Progress Notes (Signed)
I Cosign with Demarcus Steward on all assessments, documentation and medication administration for this shift. Ronnette Hila, RN

## 2014-01-21 NOTE — Clinical Documentation Improvement (Signed)
Possible Clinical Conditions?    Diabetes Mellitus Type 1 (controlled or uncontrolled) Diabetes Mellitus Type 2 (controlled or uncontrolled) Due to underlying condition/secondary (please specify condition) Due to drugs or chemical (please specify associated drug or chemical)                               Other Condition___________________                 Cannot Clinically Determine_________   Supporting Information: Risk Factors:  No documented history of Diabetes Sepsis d/t UTI Diabetes Nurses note: But with this high an A1C, recommend that pt be discharged with oral agent to start and nutrition education to help with diabetes control. Concern that UTI may be result of hyperglycemia or vice-versa  Labs:  Glucose Normal Range: 70-99 3/17 = 344 3/18 = 160 3/20 = 212  HgbA1C Normal <5.7% 3/18 = 9.2   Treatment:  Sliding Scale Insulin Levemir injections 10 units SQ bid CBG monitoring qid before meals and at bedtime  Thank You, Estella Husk ,RN Clinical Documentation Specialist:  [place] Information Management

## 2014-01-21 NOTE — Plan of Care (Signed)
Problem: Food- and Nutrition-Related Knowledge Deficit (NB-1.1) Goal: Nutrition education Formal process to instruct or train a patient/client in a skill or to impart knowledge to help patients/clients voluntarily manage or modify food choices and eating behavior to maintain or improve health. Outcome: Completed/Met Date Met:  01/21/14  RD consulted for nutrition education regarding diabetes.     Lab Results  Component Value Date    HGBA1C 9.2* 01/19/2014    RD provided "Balanced Plate" handout. Discussed different food groups and their effects on blood sugar, emphasizing carbohydrate-containing foods. Provided list of carbohydrates and recommended serving sizes of common foods.  Discussed importance of controlled and consistent carbohydrate intake throughout the day. Provided examples of ways to balance meals/snacks and encouraged intake of high-fiber, whole grain complex carbohydrates. Teach back method used.  Expect good compliance.  Body mass index is 21.01 kg/(m^2). Pt meets criteria for normal weight based on current BMI.  Current diet order is Carbohydrate Modified with Heart Healthy restrictions, patient is consuming approximately 75% of meals at this time. Labs and medications reviewed. No further nutrition interventions warranted at this time. RD contact information provided. If additional nutrition issues arise, please re-consult RD.  Inda Coke MS, RD, LDN Inpatient Registered Dietitian Pager: [phone] After-hours pager: [phone]

## 2014-01-21 NOTE — Progress Notes (Signed)
Spoke with patient and her husband about new diabetes diagnosis.  Patient and her husband have already been reading the Living Well with Diabetes booklet and have met with the dietician today. Patient reports that she has never been diagnosed with diabetes but her PCP has said that her blood sugar ran "just a little higher than normal on labs" when her blood glucose was checked with annual labs.  Patient also states that she was checking her blood sugar daily (recommended by her case manager with [employer] that calls her every 2 weeks).  She reports that her blood sugar was usually normal so she stopped checking her blood sugar.  Patient states that she still has her glucometer and she does not feel that she needs a new one so she will purchase more test strips for the glucometer she has at home.  Discussed A1C results (9.2% on 01/19/14) and explained what an A1C is, basic pathophysiology of DM Type 2, basic home care, importance of checking CBGs and maintaining good CBG control to prevent long-term and short-term complications. Reviewed Living Well with Diabetes booklet in detail with patient and her husband.  Patient states that from her understanding she will be discharged on oral diabetes medications and will follow up with her PCP for adjustments in medications. Have asked patient to monitor blood sugar and keep a log she can take with her to her follow appointment with her PCP.  Reviewed signs and symptoms of hyperglycemia and hypoglycemia along with treatment for both. RNs to provide ongoing basic DM education at bedside with this patient and engage patient to actively check blood glucose.  Patient verbalized understanding of information discussed and reports that she does not have any further questions related to diabetes at this time.  Thanks, Barnie Alderman, RN, MSN, CCRN Diabetes Coordinator Inpatient Diabetes Program [phone] (Team Pager) [phone] ([facility] office) [phone] [facility] office)

## 2014-01-21 NOTE — Progress Notes (Signed)
Pt stable throughout shift. No complaints of pain. No PRNs given

## 2014-01-22 DIAGNOSIS — E119 Type 2 diabetes mellitus without complications: Secondary | ICD-10-CM

## 2014-01-22 LAB — BASIC METABOLIC PANEL
BUN: 34 mg/dL — ABNORMAL HIGH (ref 6–23)
CO2: 23 meq/L (ref 19–32)
CREATININE: 1.23 mg/dL — AB (ref 0.50–1.10)
Calcium: 8.5 mg/dL (ref 8.4–10.5)
Chloride: 104 mEq/L (ref 96–112)
GFR calc Af Amer: 44 mL/min — ABNORMAL LOW (ref >90)
GFR, EST NON AFRICAN AMERICAN: 38 mL/min — AB (ref >90)
Glucose, Bld: 210 mg/dL — ABNORMAL HIGH (ref 70–99)
Potassium: 5.3 mEq/L (ref 3.7–5.3)
Sodium: 140 mEq/L (ref 137–147)

## 2014-01-22 LAB — URINE CULTURE

## 2014-01-22 LAB — GLUCOSE, CAPILLARY
GLUCOSE-CAPILLARY: 205 mg/dL — AB (ref 70–99)
Glucose-Capillary: 73 mg/dL (ref 70–99)
Glucose-Capillary: 202 mg/dL — ABNORMAL HIGH (ref 70–99)
Glucose-Capillary: 86 mg/dL (ref 70–99)

## 2014-01-22 MED ORDER — SULFAMETHOXAZOLE-TMP DS 800-160 MG PO TABS: 1.0000 | ORAL_TABLET | Freq: Every day | ORAL | Status: DC

## 2014-01-22 MED ORDER — GLIPIZIDE 5 MG PO TABS
5.0000 mg | ORAL_TABLET | Freq: Every day | ORAL | Status: DC
  Administered 2014-01-23: 5 mg via ORAL
  Filled 2014-01-22 (×2): qty 1

## 2014-01-22 MED ORDER — SULFAMETHOXAZOLE-TMP DS 800-160 MG PO TABS
1.0000 | ORAL_TABLET | Freq: Two times a day (BID) | ORAL | Status: DC
Start: 2014-01-22 — End: 2014-01-22

## 2014-01-22 MED ORDER — SULFAMETHOXAZOLE-TRIMETHOPRIM 400-80 MG PO TABS
1.0000 | ORAL_TABLET | Freq: Two times a day (BID) | ORAL | Status: DC
  Administered 2014-01-22 – 2014-01-23 (×2): 1 via ORAL
  Filled 2014-01-22 (×4): qty 1

## 2014-01-22 NOTE — Progress Notes (Signed)
The patient did not have any acute changes overnight, but she is complaining about painful sores on her ears.

## 2014-01-22 NOTE — Discharge Instructions (Signed)
Information on my medicine - Pradaxa (dabigatran)  This medication education was reviewed with me or my healthcare representative as part of my discharge preparation.  The pharmacist that spoke with me during my hospital stay was:  Angelica Chessman [facility]  Why was Pradaxa prescribed for you? Pradaxa was prescribed for you to reduce the risk of forming blood clots that cause a stroke if you have a medical condition called atrial fibrillation (a type of irregular heartbeat).    What do you Need to know about PradAXa? Take your Pradaxa TWICE DAILY - one capsule in the morning and one tablet in the evening with or without food.  It would be best to take the doses about the same time each day.  The capsules should not be broken, chewed or opened - they must be swallowed whole.  Do not store Pradaxa in other medication containers - once the bottle is opened the Pradaxa should be used within FOUR months; throw away any capsules that havent been by that time.  Take Pradaxa exactly as prescribed by your doctor.  DO NOT stop taking Pradaxa without talking to the doctor who prescribed the medication.  Stopping without other stroke prevention medication to take the place of Pradaxa may increase your risk of developing a clot that causes a stroke.  Refill your prescription before you run out.  After discharge, you should have regular check-up appointments with your healthcare provider that is prescribing your Pradaxa.  In the future your dose may need to be changed if your kidney function or weight changes by a significant amount.  What do you do if you miss a dose? If you miss a dose, take it as soon as you remember on the same day.  If your next dose is less than 6 hours away, skip the missed dose.  Do not take two doses of PRADAXA at the same time.  Important Safety Information A possible side effect of Pradaxa is bleeding. You should call your healthcare provider right away if you experience any  of the following:   Bleeding from an injury or your nose that does not stop.   Unusual colored urine (red or dark brown) or unusual colored stools (red or black).   Unusual bruising for unknown reasons.   A serious fall or if you hit your head (even if there is no bleeding).  Some medicines may interact with Pradaxa and might increase your risk of bleeding or clotting while on Pradaxa. To help avoid this, consult your healthcare provider or pharmacist prior to using any new prescription or non-prescription medications, including herbals, vitamins, non-steroidal anti-inflammatory drugs (NSAIDs) and supplements.  This website has more information on Pradaxa (dabigatran): www.[employer].cz.

## 2014-01-22 NOTE — Progress Notes (Signed)
[facility] PROGRESS NOTE Interim History: 80 yo female h/o afib, ckd baseline cr 0.6-2, systolic chf ef approx 69%, was seeing her cardiologist today, when she mentioned how awful she had been feeling so he sent her to ED. Pt has had over one week of worsening urinary frequency and incontinence. Several days of chills, and now suprapubic pain. No dysuria. subj fevers with U/A significant for UTI.  Assessment/Plan: Sepsis due to UTI: - Resolved with IV antibiotics and IV Fluids.  UTI (lower urinary tract infection) - Started on rocephin 3.17.2015. - U.C. E.coli. BC negative till date.. - Cr at baseline.  Newly diagnose DM: - start glipizide. - HbgA1c 9.2, DM education.  Near syncope: - no events on pace interrogation. - none focal physical exam, Blood Glucose >100,Bp check after episode and was 80 with HR of 40's.  Hyperkalemia: - Kayexalate. - b-met am   Chronic systolic CHF (congestive heart failure) - Euvolemic. - KVO IV fluids  CKD (chronic kidney disease), stage III: - cr at baseline.  PAF (paroxysmal atrial fibrillation)/  Pacemaker - rate controlled. - cont Amio and metoprolol.     Code Status: Full Family Communication: none  Disposition Plan: inpatient   Consultants:  none  Procedures:  CXR  Antibiotics:  Rocephin 3.17.2015.  HPI/Subjective: No complains. Feels better.  Objective: Filed Vitals:   01/21/14 1433 01/21/14 2102 01/22/14 0533 01/22/14 1115  BP: 134/70 111/56 127/69 131/69  Pulse: 74 89 62 88  Temp: 98 F (36.7 C)  98.1 F (36.7 C) 98.2 F (36.8 C)  TempSrc: Oral  Oral Oral  Resp: '16 18 19 18  ' Height:      Weight:   52.4 kg (115 lb 8.3 oz)   SpO2: 95% 95% 99% 100%    Intake/Output Summary (Last 24 hours) at 01/22/14 1317 Last data filed at 01/22/14 1306  Gross per 24 hour  Intake   1180 ml  Output      0 ml  Net   1180 ml   Filed Weights   01/20/14 0632 01/21/14 0439 01/22/14 0533  Weight: 51.4 kg (113  lb 5.1 oz) 51.256 kg (113 lb) 52.4 kg (115 lb 8.3 oz)    Exam:  General: Alert, awake, oriented x3, in no acute distress.  HEENT: No bruits, no goiter.  Heart: Regular rate and rhythm,  Lungs: Good air movement, clear  Abdomen: Soft, nontender, nondistended, positive bowel sounds.     Data Reviewed: Basic Metabolic Panel:  Recent Labs Lab 01/18/14 1606 01/19/14 0345 01/21/14 0550 01/22/14 0834  NA 137 141 136* 140  K 5.5* 5.2 5.4* 5.3  CL 100 107 104 104  CO2 20 18* 19 23  GLUCOSE 344* 160* 212* 210*  BUN 55* 47* 44* 34*  CREATININE 1.61* 1.42* 1.29* 1.23*  CALCIUM 8.9 8.3* 8.3* 8.5   Liver Function Tests:  Recent Labs Lab 01/18/14 1606  AST 16  ALT 25  ALKPHOS 91  BILITOT 0.6  PROT 6.0  ALBUMIN 3.3*   No results found for this basename: LIPASE, AMYLASE,  in the last 168 hours No results found for this basename: AMMONIA,  in the last 168 hours CBC:  Recent Labs Lab 01/18/14 1606 01/19/14 0345  WBC 27.0* 19.9*  HGB 12.0 11.0*  HCT 36.0 32.6*  MCV 93.8 92.6  PLT 276 222   Cardiac Enzymes: No results found for this basename: CKTOTAL, CKMB, CKMBINDEX, TROPONINI,  in the last 168 hours BNP (last 3 results)  Recent Labs  12/06/13 1129  PROBNP 200.0*   CBG:  Recent Labs Lab 01/21/14 1047 01/21/14 1549 01/21/14 2122 01/22/14 0633 01/22/14 1100  GLUCAP 153* 212* 236* 73 205*    Recent Results (from the past 240 hour(s))  CULTURE, BLOOD (ROUTINE X 2)     Status: None   Collection Time    01/19/14 10:10 AM      Result Value Ref Range Status   Specimen Description BLOOD RIGHT HAND   Final   Special Requests BOTTLES DRAWN AEROBIC ONLY 8CC   Final   Culture  Setup Time     Final   Value: 01/19/2014 14:32     Performed at [facility]   Culture     Final   Value:        BLOOD CULTURE RECEIVED NO GROWTH TO DATE CULTURE WILL BE HELD FOR 5 DAYS BEFORE ISSUING A FINAL NEGATIVE REPORT     Performed at [facility]   Report Status  PENDING   Incomplete  CULTURE, BLOOD (ROUTINE X 2)     Status: None   Collection Time    01/19/14 10:20 AM      Result Value Ref Range Status   Specimen Description BLOOD RIGHT ANTECUBITAL   Final   Special Requests BOTTLES DRAWN AEROBIC ONLY 10CC   Final   Culture  Setup Time     Final   Value: 01/19/2014 14:32     Performed at [facility]   Culture     Final   Value:        BLOOD CULTURE RECEIVED NO GROWTH TO DATE CULTURE WILL BE HELD FOR 5 DAYS BEFORE ISSUING A FINAL NEGATIVE REPORT     Performed at [facility]   Report Status PENDING   Incomplete  URINE CULTURE     Status: None   Collection Time    01/19/14  2:44 PM      Result Value Ref Range Status   Specimen Description URINE, RANDOM   Final   Special Requests NONE   Final   Culture  Setup Time     Final   Value: 01/19/2014 19:39     Performed at [facility]     Final   Value: >=100,000 COLONIES/ML     Performed at [facility]   Culture     Final   Value: ESCHERICHIA COLI     Performed at [facility]   Report Status PENDING   Incomplete     Studies: No results found.  Scheduled Meds: . ALPRAZolam  0.25 mg Oral BID  . amiodarone  200 mg Oral Daily  . B-complex with vitamin C  1 tablet Oral Daily  . cefTRIAXone (ROCEPHIN)  IV  1 g Intravenous Q24H  . dabigatran  75 mg Oral BID  . feeding supplement (ENSURE)  1 Container Oral TID BM  . glipiZIDE  2.5 mg Oral QAC breakfast  . insulin aspart  0-5 Units Subcutaneous QHS  . insulin aspart  0-9 Units Subcutaneous TID WC  . insulin detemir  10 Units Subcutaneous BID  . metoprolol  50 mg Oral BID  . pantoprazole  40 mg Oral Daily   Continuous Infusions:     Charlynne Cousins  [facility] Pager [phone]. If 8PM-8AM, please contact night-coverage at [email or web address], password [email or web address] 01/22/2014, 1:17 PM  LOS: 4 days

## 2014-01-22 NOTE — ED Provider Notes (Signed)
Medical screening examination/treatment/procedure(s) were conducted as a shared visit with non-physician practitioner(s) and myself.  I personally evaluated the patient during the encounter.   EKG Interpretation None      Results for orders placed during the hospital encounter of 01/18/14  URINE CULTURE      Result Value Ref Range   Specimen Description URINE, RANDOM     Special Requests NONE     Culture  Setup Time       Value: 01/19/2014 19:39     Performed at [facility]       Value: >=100,000 COLONIES/ML     Performed at [facility]   Culture       Value: ESCHERICHIA COLI     Performed at [facility]   Report Status PENDING    CULTURE, BLOOD (ROUTINE X 2)      Result Value Ref Range   Specimen Description BLOOD RIGHT HAND     Special Requests BOTTLES DRAWN AEROBIC ONLY 8CC     Culture  Setup Time       Value: 01/19/2014 14:32     Performed at [facility]   Culture       Value:        BLOOD CULTURE RECEIVED NO GROWTH TO DATE CULTURE WILL BE HELD FOR 5 DAYS BEFORE ISSUING A FINAL NEGATIVE REPORT     Performed at [facility]   Report Status PENDING    CULTURE, BLOOD (ROUTINE X 2)      Result Value Ref Range   Specimen Description BLOOD RIGHT ANTECUBITAL     Special Requests BOTTLES DRAWN AEROBIC ONLY 10CC     Culture  Setup Time       Value: 01/19/2014 14:32     Performed at [facility]   Culture       Value:        BLOOD CULTURE RECEIVED NO GROWTH TO DATE CULTURE WILL BE HELD FOR 5 DAYS BEFORE ISSUING A FINAL NEGATIVE REPORT     Performed at [facility]   Report Status PENDING    URINALYSIS, ROUTINE W REFLEX MICROSCOPIC      Result Value Ref Range   Color, Urine YELLOW  YELLOW   APPearance CLOUDY (*) CLEAR   Specific Gravity, Urine 1.022  1.005 - 1.030   pH 5.0  5.0 - 8.0   Glucose, UA >1000 (*) NEGATIVE mg/dL   Hgb urine dipstick MODERATE (*) NEGATIVE   Bilirubin Urine NEGATIVE   NEGATIVE   Ketones, ur NEGATIVE  NEGATIVE mg/dL   Protein, ur 100 (*) NEGATIVE mg/dL   Urobilinogen, UA 1.0  0.0 - 1.0 mg/dL   Nitrite NEGATIVE  NEGATIVE   Leukocytes, UA LARGE (*) NEGATIVE  CBC      Result Value Ref Range   WBC 27.0 (*) 4.0 - 10.5 K/uL   RBC 3.84 (*) 3.87 - 5.11 MIL/uL   Hemoglobin 12.0  12.0 - 15.0 g/dL   HCT 36.0  36.0 - 46.0 %   MCV 93.8  78.0 - 100.0 fL   MCH 31.3  26.0 - 34.0 pg   MCHC 33.3  30.0 - 36.0 g/dL   RDW 15.3  11.5 - 15.5 %   Platelets 276  150 - 400 K/uL  COMPREHENSIVE METABOLIC PANEL      Result Value Ref Range   Sodium 137  137 - 147 mEq/L   Potassium 5.5 (*) 3.7 - 5.3 mEq/L  Chloride 100  96 - 112 mEq/L   CO2 20  19 - 32 mEq/L   Glucose, Bld 344 (*) 70 - 99 mg/dL   BUN 55 (*) 6 - 23 mg/dL   Creatinine, Ser 1.61 (*) 0.50 - 1.10 mg/dL   Calcium 8.9  8.4 - 10.5 mg/dL   Total Protein 6.0  6.0 - 8.3 g/dL   Albumin 3.3 (*) 3.5 - 5.2 g/dL   AST 16  0 - 37 U/L   ALT 25  0 - 35 U/L   Alkaline Phosphatase 91  39 - 117 U/L   Total Bilirubin 0.6  0.3 - 1.2 mg/dL   GFR calc non Af Amer 28 (*) >90 mL/min   GFR calc Af Amer 32 (*) >90 mL/min  URINE MICROSCOPIC-ADD ON      Result Value Ref Range   Squamous Epithelial / LPF RARE  RARE   WBC, UA TOO NUMEROUS TO COUNT  <3 WBC/hpf   RBC / HPF 0-2  <3 RBC/hpf   Bacteria, UA MANY (*) RARE  BASIC METABOLIC PANEL      Result Value Ref Range   Sodium 141  137 - 147 mEq/L   Potassium 5.2  3.7 - 5.3 mEq/L   Chloride 107  96 - 112 mEq/L   CO2 18 (*) 19 - 32 mEq/L   Glucose, Bld 160 (*) 70 - 99 mg/dL   BUN 47 (*) 6 - 23 mg/dL   Creatinine, Ser 1.42 (*) 0.50 - 1.10 mg/dL   Calcium 8.3 (*) 8.4 - 10.5 mg/dL   GFR calc non Af Amer 32 (*) >90 mL/min   GFR calc Af Amer 37 (*) >90 mL/min  CBC      Result Value Ref Range   WBC 19.9 (*) 4.0 - 10.5 K/uL   RBC 3.52 (*) 3.87 - 5.11 MIL/uL   Hemoglobin 11.0 (*) 12.0 - 15.0 g/dL   HCT 32.6 (*) 36.0 - 46.0 %   MCV 92.6  78.0 - 100.0 fL   MCH 31.3  26.0 - 34.0 pg    MCHC 33.7  30.0 - 36.0 g/dL   RDW 15.4  11.5 - 15.5 %   Platelets 222  150 - 400 K/uL  GLUCOSE, CAPILLARY      Result Value Ref Range   Glucose-Capillary 211 (*) 70 - 99 mg/dL   Comment 1 Notify RN     Comment 2 Documented in Chart    HEMOGLOBIN A1C      Result Value Ref Range   Hemoglobin A1C 9.2 (*) <5.7 %   Mean Plasma Glucose 217 (*) <117 mg/dL  GLUCOSE, CAPILLARY      Result Value Ref Range   Glucose-Capillary 144 (*) 70 - 99 mg/dL  GLUCOSE, CAPILLARY      Result Value Ref Range   Glucose-Capillary 149 (*) 70 - 99 mg/dL   Comment 1 Notify RN    GLUCOSE, CAPILLARY      Result Value Ref Range   Glucose-Capillary 131 (*) 70 - 99 mg/dL   Comment 1 Notify RN    GLUCOSE, CAPILLARY      Result Value Ref Range   Glucose-Capillary 230 (*) 70 - 99 mg/dL   Comment 1 Notify RN    GLUCOSE, CAPILLARY      Result Value Ref Range   Glucose-Capillary 194 (*) 70 - 99 mg/dL  GLUCOSE, CAPILLARY      Result Value Ref Range   Glucose-Capillary 196 (*) 70 - 99 mg/dL  GLUCOSE, CAPILLARY      Result Value Ref Range   Glucose-Capillary 228 (*) 70 - 99 mg/dL  BASIC METABOLIC PANEL      Result Value Ref Range   Sodium 136 (*) 137 - 147 mEq/L   Potassium 5.4 (*) 3.7 - 5.3 mEq/L   Chloride 104  96 - 112 mEq/L   CO2 19  19 - 32 mEq/L   Glucose, Bld 212 (*) 70 - 99 mg/dL   BUN 44 (*) 6 - 23 mg/dL   Creatinine, Ser 1.29 (*) 0.50 - 1.10 mg/dL   Calcium 8.3 (*) 8.4 - 10.5 mg/dL   GFR calc non Af Amer 36 (*) >90 mL/min   GFR calc Af Amer 42 (*) >90 mL/min  GLUCOSE, CAPILLARY      Result Value Ref Range   Glucose-Capillary 115 (*) 70 - 99 mg/dL  GLUCOSE, CAPILLARY      Result Value Ref Range   Glucose-Capillary 188 (*) 70 - 99 mg/dL  GLUCOSE, CAPILLARY      Result Value Ref Range   Glucose-Capillary 153 (*) 70 - 99 mg/dL   Comment 1 Notify RN    GLUCOSE, CAPILLARY      Result Value Ref Range   Glucose-Capillary 212 (*) 70 - 99 mg/dL  GLUCOSE, CAPILLARY      Result Value Ref Range    Glucose-Capillary 236 (*) 70 - 99 mg/dL   Comment 1 Notify RN     Comment 2 Documented in Chart    I-STAT CG4 LACTIC ACID, ED      Result Value Ref Range   Lactic Acid, Venous 1.84  0.5 - 2.2 mmol/L   No results found.  Patient seen by me. Patient presented the with urinary frequency is been ongoing for the past 2-3 weeks. Patient's been feeling feverish. Workup here consistent with UTI perhaps early pyelonephritis with a marked leukocytosis white count near 20,000. No evidence of sepsis. Patient's lactic acid is less than 2. Urinalysis is consistent with a significant urinary tract infection. Patient treated with IV Rocephin and will require admission due to her age a marked leukocytosis and probably symptoms of early pyelo-.  Mervin Kung, MD 01/22/14 [phone]

## 2014-01-23 LAB — GLUCOSE, CAPILLARY: GLUCOSE-CAPILLARY: 108 mg/dL — AB (ref 70–99)

## 2014-01-23 MED ORDER — FUROSEMIDE 20 MG PO TABS: 20.0000 mg | ORAL_TABLET | Freq: Every day | ORAL | Status: DC

## 2014-01-23 MED ORDER — SULFAMETHOXAZOLE-TRIMETHOPRIM 400-80 MG PO TABS: 1.0000 | ORAL_TABLET | Freq: Two times a day (BID) | ORAL | Status: DC

## 2014-01-23 MED ORDER — FUROSEMIDE 20 MG PO TABS: 20.0000 mg | ORAL_TABLET | Freq: Every day | ORAL | Status: AC

## 2014-01-23 MED ORDER — GLIPIZIDE 5 MG PO TABS: 5.0000 mg | ORAL_TABLET | Freq: Every day | ORAL | Status: DC

## 2014-01-23 MED ORDER — SODIUM POLYSTYRENE SULFONATE 15 GM/60ML PO SUSP
15.0000 g | Freq: Once | ORAL | Status: AC
  Administered 2014-01-23: 15 g via ORAL
  Filled 2014-01-23: qty 60

## 2014-01-23 NOTE — Progress Notes (Signed)
Patient discharged home with husband. Discharge instructions and medications reviewed with Patient. Patient states understanding. All questions answered.

## 2014-01-23 NOTE — Progress Notes (Signed)
Pt alert and oriented times 4, NAD voiced

## 2014-01-23 NOTE — Progress Notes (Signed)
Pt o4x, no complaints of SOB. Pt states ear pain MD aware. MD to d/c Pt home today Will continue to monitor

## 2014-01-23 NOTE — Discharge Summary (Signed)
Physician Discharge Summary  MENUCHA DICESARE PHK:[id number] DOB: 13-Jul-1924 DOA: 01/18/2014  PCP: [name], MD  Admit date: 01/18/2014 Discharge date: 01/23/2014  Time spent:35 minutes  Recommendations for Outpatient Follow-up:  1. Follow up with PCP in 2 weeks.  Discharge Diagnoses:  Principal Problem:   UTI (lower urinary tract infection) Active Problems:   Sepsis   CKD (chronic kidney disease), stage III   Pacemaker   PAF (paroxysmal atrial fibrillation)   Chronic systolic CHF (congestive heart failure)   General weakness   Type II or unspecified type diabetes mellitus without mention of complication, not stated as uncontrolled   Discharge Condition: stable  Diet recommendation: carb mod  Filed Weights   01/21/14 0439 01/22/14 0533 01/23/14 0515  Weight: 51.256 kg (113 lb) 52.4 kg (115 lb 8.3 oz) 51.8 kg (114 lb 3.2 oz)    History of present illness:  80 yo female h/o afib, ckd baseline cr 2.9-5, systolic chf ef approx 74%, was seeing her cardiologist today, when she mentioned how awful she had been feeling so he sent her to ED. Pt has had over one week of worsening urinary frequency and incontinence. Several days of chills, and now suprapubic pain. No dysuria. subj fevers. No n/v. She has been very weak and can barely get out of bed due to the weakness. She lives with her husband, who is present with her in the ED. Denies any dizziness or syncope. She has not been on abx recently.   Hospital Course:  Sepsis due to UTI:  - Resolved with IV antibiotics and IV Fluids.   UTI (lower urinary tract infection)  - Started on rocephin 3.17.2015.  - U.C. E.coli. Sensitive to bactrim, will cont at home for 2 additional days. - Cr at baseline.   Newly diagnose DM:  - start glipizide.  - HbgA1c 9.2, DM education.  - recheck in 3 months HbgA1c.  Near syncope:  - no events on pace interrogation.  - none focal physical exam, Blood Glucose >100,Bp check after episode and was 80  with HR of 40's.   Hyperkalemia:  - Kayexalate.  - b-met am   Chronic systolic CHF (congestive  - euvolemic.   Procedures:  none  Consultations:  none  Discharge Exam: Filed Vitals:   01/23/14 0515  BP: 138/62  Pulse: 74  Temp: 98 F (36.7 C)  Resp: 18    General: A&O x3 Cardiovascular: RRR Respiratory: good air movement CTA B/L  Discharge Instructions  Discharge Orders   Future Appointments Provider Department Dept Phone   03/01/2014 2:00 PM Andrez Grime, PA-C [facility] [phone]   Future Orders Complete By Expires   Diet - low sodium heart healthy  As directed    Increase activity slowly  As directed        Medication List         acetaminophen 325 MG tablet  Commonly known as:  TYLENOL  Take 325 mg by mouth every 6 (six) hours as needed for pain.     ALPRAZolam 0.25 MG tablet  Commonly known as:  XANAX  Take 1 tablet (0.25 mg total) by mouth 2 (two) times daily.     amiodarone 200 MG tablet  Commonly known as:  PACERONE  Take 1 tablet (200 mg total) by mouth daily.     B-complex with vitamin C tablet  Take 1 tablet by mouth daily.     dabigatran 75 MG Caps capsule  Commonly known as:  PRADAXA  Take 75 mg by mouth 2 (two) times daily.     diclofenac sodium 1 % Gel  Commonly known as:  VOLTAREN  Apply 2 g topically 4 (four) times daily.     glipiZIDE 5 MG tablet  Commonly known as:  GLUCOTROL  Take 1 tablet (5 mg total) by mouth daily before breakfast.     metoprolol 50 MG tablet  Commonly known as:  LOPRESSOR  Take 1 tablet (50 mg total) by mouth 2 (two) times daily.     pantoprazole 40 MG tablet  Commonly known as:  PROTONIX  Take 40 mg by mouth daily.     sulfamethoxazole-trimethoprim 400-80 MG per tablet  Commonly known as:  BACTRIM,SEPTRA  Take 1 tablet by mouth every 12 (twelve) hours.       Allergies  Allergen Reactions  . Codeine Nausea And Vomiting  . Other Nausea And Vomiting     Narcotics  dizziness      The results of significant diagnostics from this hospitalization (including imaging, microbiology, ancillary and laboratory) are listed below for reference.    Significant Diagnostic Studies: No results found.  Microbiology: Recent Results (from the past 240 hour(s))  CULTURE, BLOOD (ROUTINE X 2)     Status: None   Collection Time    01/19/14 10:10 AM      Result Value Ref Range Status   Specimen Description BLOOD RIGHT HAND   Final   Special Requests BOTTLES DRAWN AEROBIC ONLY 8CC   Final   Culture  Setup Time     Final   Value: 01/19/2014 14:32     Performed at [facility]   Culture     Final   Value:        BLOOD CULTURE RECEIVED NO GROWTH TO DATE CULTURE WILL BE HELD FOR 5 DAYS BEFORE ISSUING A FINAL NEGATIVE REPORT     Performed at [facility]   Report Status PENDING   Incomplete  CULTURE, BLOOD (ROUTINE X 2)     Status: None   Collection Time    01/19/14 10:20 AM      Result Value Ref Range Status   Specimen Description BLOOD RIGHT ANTECUBITAL   Final   Special Requests BOTTLES DRAWN AEROBIC ONLY 10CC   Final   Culture  Setup Time     Final   Value: 01/19/2014 14:32     Performed at [facility]   Culture     Final   Value:        BLOOD CULTURE RECEIVED NO GROWTH TO DATE CULTURE WILL BE HELD FOR 5 DAYS BEFORE ISSUING A FINAL NEGATIVE REPORT     Performed at [facility]   Report Status PENDING   Incomplete  URINE CULTURE     Status: None   Collection Time    01/19/14  2:44 PM      Result Value Ref Range Status   Specimen Description URINE, RANDOM   Final   Special Requests NONE   Final   Culture  Setup Time     Final   Value: 01/19/2014 19:39     Performed at [facility] Count     Final   Value: >=100,000 COLONIES/ML     Performed at [facility]   Culture     Final   Value: ESCHERICHIA COLI     Note: Confirmed Extended Spectrum Beta-Lactamase Producer (ESBL) Two isolates  with different morphologies were identified  as the same organism.The most resistant organism was reported.     Performed at [facility]   Report Status 01/22/2014 FINAL   Final   Organism ID, Bacteria ESCHERICHIA COLI   Final     Labs: Basic Metabolic Panel:  Recent Labs Lab 01/18/14 1606 01/19/14 0345 01/21/14 0550 01/22/14 0834  NA 137 141 136* 140  K 5.5* 5.2 5.4* 5.3  CL 100 107 104 104  CO2 20 18* 19 23  GLUCOSE 344* 160* 212* 210*  BUN 55* 47* 44* 34*  CREATININE 1.61* 1.42* 1.29* 1.23*  CALCIUM 8.9 8.3* 8.3* 8.5   Liver Function Tests:  Recent Labs Lab 01/18/14 1606  AST 16  ALT 25  ALKPHOS 91  BILITOT 0.6  PROT 6.0  ALBUMIN 3.3*   No results found for this basename: LIPASE, AMYLASE,  in the last 168 hours No results found for this basename: AMMONIA,  in the last 168 hours CBC:  Recent Labs Lab 01/18/14 1606 01/19/14 0345  WBC 27.0* 19.9*  HGB 12.0 11.0*  HCT 36.0 32.6*  MCV 93.8 92.6  PLT 276 222   Cardiac Enzymes: No results found for this basename: CKTOTAL, CKMB, CKMBINDEX, TROPONINI,  in the last 168 hours BNP: BNP (last 3 results)  Recent Labs  12/06/13 1129  PROBNP 200.0*   CBG:  Recent Labs Lab 01/22/14 0633 01/22/14 1100 01/22/14 1633 01/22/14 2111 01/23/14 0550  GLUCAP 73 205* 202* 86 108*       Signed:  FELIZ ORTIZ, Susy Placzek  [facility] 01/23/2014, 7:49 AM

## 2014-01-24 NOTE — Care Management Note (Addendum)
  Page 1 of 1   01/24/2014     2:20:31 PM   CARE MANAGEMENT NOTE 01/24/2014  Patient:  GLORISTINE, TURRUBIATES   Account Number:  [id number]  Date Initiated:  01/24/2014  Documentation initiated by:  Macoy Rodwell  Subjective/Objective Assessment:   Admitted with UTI     Action/Plan:   Anticipated DC Date:     Anticipated DC Plan:           Choice offered to / List presented to:             Status of service:  Completed, signed off Medicare Important Message given?   (If response is "NO", the following Medicare IM given date fields will be blank) Date Medicare IM given:   Date Additional Medicare IM given:    Discharge Disposition:  HOME/SELF CARE  Per UR Regulation:  Reviewed for med. necessity/level of care/duration of stay  If discussed at [place] of Stay Meetings, dates discussed:    Comments:  Retro Note: d/c to home 01/23/2014 [place], BSN, [place], CCM 01/24/2014

## 2014-01-25 LAB — CULTURE, BLOOD (ROUTINE X 2)
Culture: NO GROWTH
Culture: NO GROWTH

## 2014-02-11 ENCOUNTER — Encounter: Payer: Self-pay | Admitting: Cardiology

## 2014-02-14 ENCOUNTER — Encounter: Payer: Self-pay | Admitting: Cardiology

## 2014-03-01 ENCOUNTER — Encounter: Payer: Medicare Other | Admitting: Cardiology

## 2014-03-07 ENCOUNTER — Encounter: Payer: Self-pay | Admitting: Cardiovascular Disease

## 2014-03-07 ENCOUNTER — Ambulatory Visit (INDEPENDENT_AMBULATORY_CARE_PROVIDER_SITE_OTHER): Payer: Medicare Other | Admitting: Cardiovascular Disease

## 2014-03-07 ENCOUNTER — Telehealth: Payer: Self-pay | Admitting: Internal Medicine

## 2014-03-07 VITALS — BP 90/58 | HR 104 | Ht 61.5 in | Wt 107.8 lb

## 2014-03-07 DIAGNOSIS — I4891 Unspecified atrial fibrillation: Secondary | ICD-10-CM

## 2014-03-07 DIAGNOSIS — R5383 Other fatigue: Secondary | ICD-10-CM

## 2014-03-07 DIAGNOSIS — I447 Left bundle-branch block, unspecified: Secondary | ICD-10-CM

## 2014-03-07 DIAGNOSIS — R5381 Other malaise: Secondary | ICD-10-CM

## 2014-03-07 DIAGNOSIS — R531 Weakness: Secondary | ICD-10-CM

## 2014-03-07 DIAGNOSIS — I495 Sick sinus syndrome: Secondary | ICD-10-CM

## 2014-03-07 NOTE — Telephone Encounter (Signed)
New message           Pt states she is shaking real bad / heart racing / pt says her heart is out of whack again / pt is due to see brooke on the 7th.

## 2014-03-07 NOTE — Telephone Encounter (Signed)
Spoke with pt who reports feeling like her heart is "out of whack" and she is shaking all over.  She has felt like this before when in [facility].  She feels like her heart beat is irregular but she doesn't know how to check her pulse.  She doesn't have any one to do it for her.  She reports symptoms started a few days ago but have gotten worse and she doesn't feel like she can or should wait until Thursday when she has an appt with Jerene Pitch.  Pt was added onto Dr Camillia Herter schedule today at 1:30 pm.  She is aware to go to ED for evaluation if s/s worsen prior to then.

## 2014-03-07 NOTE — Progress Notes (Signed)
History of Present Illness: 80 yo female with history of PAF, LBBB, syncope, sinus node dysfunction s/p pacemaker who is followed in our EP clinic by Dr. Caryl Comes and is added onto my schedule as doctor of the day. She called our office today and stated that she was concerned that her heart is out of rhythm. She has been feeling shaky. She has been on amiodarone.   She is here today as an add on. She has been feeling weak and shaky over the last three weeks. She was admitted with a UTI three weeks ago and has not felt well since then.  She was seen in primary care last week and has been referred to see urology. She called our office to be seen to make sure this was not her heart. Dr. Terrence Dupont is her primary cardiologist but her EP issues are followed here by Dr. Caryl Comes. EKG today appears to be regular and similar to EKG from January 2015. She has a baseline LBBB. She notes no fevers. Her family member who is with her today states that she is always very shaky with baseline tremor. No dysuria currently. She has been taking all of her medications.   Primary Care Physician: [name]  Past Medical History  Diagnosis Date  . Syncope   . Sinus node dysfunction   . S/P cardiac pacemaker procedure     Guidant  . Wide-complex tachycardia   . Cholelithiasis   . Glucose intolerance (impaired glucose tolerance)   . DJD (degenerative joint disease)   . Anxiety   . PONV (postoperative nausea and vomiting)   . Pacemaker   . Chronic kidney disease     frequency  . Breast cancer     left breast - hx of 2003   . Injury of right upper arm     after venipuncture in 3/13 - pt describes pain afterwards- no followup done seen by PCP- no further treatment   . PAF (paroxysmal atrial fibrillation)   . Left bundle branch block     Past Surgical History  Procedure Laterality Date  . S/p pacemaker    . Replacement total knee bilateral    . Total shoulder replacement    . Total hip arthroplasty    . Femur  fracture surgery    . Hysterectomy, unspecified area    . Masterectomy    . Abdominal hysterectomy    . Breast surgery      left breast - 2003   . Total hip arthroplasty  06/16/2012    Procedure: TOTAL HIP ARTHROPLASTY;  Surgeon: Mauri Pole, MD;  Location: [facility];  Service: Orthopedics;  Laterality: Left;  . Hip closed reduction  06/30/2012    Procedure: CLOSED MANIPULATION HIP;  Surgeon: Mauri Pole, MD;  Location: [facility];  Service: Orthopedics;  Laterality: Left;    Current Outpatient Prescriptions  Medication Sig Dispense Refill  . acetaminophen (TYLENOL) 325 MG tablet Take 325 mg by mouth every 6 (six) hours as needed for pain.      [name] ALPRAZolam (XANAX) 0.25 MG tablet Take 1 tablet (0.25 mg total) by mouth 2 (two) times daily.  20 tablet  0  . amiodarone (PACERONE) 200 MG tablet Take 1 tablet (200 mg total) by mouth daily.  90 tablet  0  . B Complex-C (B-COMPLEX WITH VITAMIN C) tablet Take 1 tablet by mouth daily.      . dabigatran (PRADAXA) 75 MG CAPS Take 75 mg by mouth 2 (  two) times daily.   60 capsule    . diclofenac sodium (VOLTAREN) 1 % GEL Apply 2 g topically 4 (four) times daily.  1 Tube  0  . glipiZIDE (GLUCOTROL) 5 MG tablet Take 1 tablet (5 mg total) by mouth daily before breakfast.  30 tablet  0  . metoprolol (LOPRESSOR) 50 MG tablet Take 1 tablet (50 mg total) by mouth 2 (two) times daily.  60 tablet  6  . pantoprazole (PROTONIX) 40 MG tablet Take 40 mg by mouth daily.      [name] sulfamethoxazole-trimethoprim (BACTRIM,SEPTRA) 400-80 MG per tablet Take 1 tablet by mouth every 12 (twelve) hours.  8 tablet  0   No current facility-administered medications for this visit.    Allergies  Allergen Reactions  . Codeine Nausea And Vomiting  . Other Nausea And Vomiting    Narcotics  dizziness    History   Social History  . Marital Status: Married    Spouse Name: N/A    Number of Children: N/A  . Years of Education: N/A   Occupational History  . Not on file.    Social History Main Topics  . Smoking status: Former Smoker    Types: Cigarettes    Quit date: 11/04/1948  . Smokeless tobacco: Never Used  . Alcohol Use: No  . Drug Use: No  . Sexual Activity: No   Other Topics Concern  . Not on file   Social History Narrative   Married, retired.     History reviewed. No pertinent family history.  Review of Systems:  As stated in the HPI and otherwise negative.   BP 90/58  Pulse 104  Ht 5' 1.5" (1.562 m)  Wt 107 lb 12.8 oz (48.898 kg)  BMI 20.04 kg/m2  Physical Examination: General: Well developed, well nourished, NAD HEENT: OP clear, mucus membranes moist SKIN: warm, dry. No rashes. Neuro: No focal deficits Musculoskeletal: Muscle strength 5/5 all ext Psychiatric: Mood and affect normal Neck: No JVD, no carotid bruits, no thyromegaly, no lymphadenopathy. Lungs:Clear bilaterally, no wheezes, rhonci, crackles Cardiovascular: Regular rate and rhythm. No murmurs, gallops or rubs. Abdomen:Soft. Bowel sounds present. Non-tender.  Extremities: No lower extremity edema. Pulses are 2 + in the bilateral DP/PT.  EKG: Sinus, regular but hard to discern p waves.  LBBB  Assessment and Plan:   1. PAF: She appears to be sinus today. She is on Pradaxa for anti-coagulation. Pacemaker in place. She is tachycardic but I think this is driven by another process. She may have an underlying illness with recent UTI and ? Ear infection. No fever or chills so suspicion for sepsis is low. She has an appt for pacemaker check later this week. I would not suggest any changes in therapy today.   2. LBBB: chronic  3. Weakness/Fatigue: I think this is due to her advanced age and could possibly be lingering effects from her UTI. I have encouraged her to see her primary care provider later this week if her symptoms continue.

## 2014-03-07 NOTE — Patient Instructions (Signed)
Keep scheduled appointment with Fenton Malling, PA on Mar 10, 2014

## 2014-03-09 ENCOUNTER — Emergency Department (HOSPITAL_COMMUNITY): Payer: Medicare Other

## 2014-03-09 ENCOUNTER — Encounter (HOSPITAL_COMMUNITY): Payer: Self-pay | Admitting: Emergency Medicine

## 2014-03-09 ENCOUNTER — Inpatient Hospital Stay (HOSPITAL_COMMUNITY)
Admission: EM | Admit: 2014-03-09 | Discharge: 2014-03-12 | DRG: 683 | Disposition: A | Discharge status: Home Health | Payer: Medicare Other | Attending: Internal Medicine | Admitting: Internal Medicine

## 2014-03-09 DIAGNOSIS — N390 Urinary tract infection, site not specified: Secondary | ICD-10-CM

## 2014-03-09 DIAGNOSIS — I951 Orthostatic hypotension: Secondary | ICD-10-CM | POA: Diagnosis present

## 2014-03-09 DIAGNOSIS — R531 Weakness: Secondary | ICD-10-CM

## 2014-03-09 DIAGNOSIS — Z950 Presence of cardiac pacemaker: Secondary | ICD-10-CM

## 2014-03-09 DIAGNOSIS — N179 Acute kidney failure, unspecified: Principal | ICD-10-CM | POA: Diagnosis present

## 2014-03-09 DIAGNOSIS — R5383 Other fatigue: Secondary | ICD-10-CM

## 2014-03-09 DIAGNOSIS — E860 Dehydration: Secondary | ICD-10-CM

## 2014-03-09 DIAGNOSIS — Z96649 Presence of unspecified artificial hip joint: Secondary | ICD-10-CM

## 2014-03-09 DIAGNOSIS — Z853 Personal history of malignant neoplasm of breast: Secondary | ICD-10-CM

## 2014-03-09 DIAGNOSIS — Z96659 Presence of unspecified artificial knee joint: Secondary | ICD-10-CM

## 2014-03-09 DIAGNOSIS — I509 Heart failure, unspecified: Secondary | ICD-10-CM

## 2014-03-09 DIAGNOSIS — M1990 Unspecified osteoarthritis, unspecified site: Secondary | ICD-10-CM | POA: Diagnosis present

## 2014-03-09 DIAGNOSIS — T82110A Breakdown (mechanical) of cardiac electrode, initial encounter: Secondary | ICD-10-CM

## 2014-03-09 DIAGNOSIS — E875 Hyperkalemia: Secondary | ICD-10-CM

## 2014-03-09 DIAGNOSIS — I495 Sick sinus syndrome: Secondary | ICD-10-CM

## 2014-03-09 DIAGNOSIS — R55 Syncope and collapse: Secondary | ICD-10-CM

## 2014-03-09 DIAGNOSIS — Z87891 Personal history of nicotine dependence: Secondary | ICD-10-CM

## 2014-03-09 DIAGNOSIS — E119 Type 2 diabetes mellitus without complications: Secondary | ICD-10-CM | POA: Diagnosis present

## 2014-03-09 DIAGNOSIS — R296 Repeated falls: Secondary | ICD-10-CM

## 2014-03-09 DIAGNOSIS — I4891 Unspecified atrial fibrillation: Secondary | ICD-10-CM

## 2014-03-09 DIAGNOSIS — Z9181 History of falling: Secondary | ICD-10-CM

## 2014-03-09 DIAGNOSIS — I480 Paroxysmal atrial fibrillation: Secondary | ICD-10-CM

## 2014-03-09 DIAGNOSIS — I5022 Chronic systolic (congestive) heart failure: Secondary | ICD-10-CM

## 2014-03-09 DIAGNOSIS — R5381 Other malaise: Secondary | ICD-10-CM

## 2014-03-09 DIAGNOSIS — W19XXXA Unspecified fall, initial encounter: Secondary | ICD-10-CM | POA: Diagnosis present

## 2014-03-09 DIAGNOSIS — N289 Disorder of kidney and ureter, unspecified: Secondary | ICD-10-CM

## 2014-03-09 DIAGNOSIS — N183 Chronic kidney disease, stage 3 unspecified: Secondary | ICD-10-CM

## 2014-03-09 DIAGNOSIS — N189 Chronic kidney disease, unspecified: Secondary | ICD-10-CM

## 2014-03-09 DIAGNOSIS — I129 Hypertensive chronic kidney disease with stage 1 through stage 4 chronic kidney disease, or unspecified chronic kidney disease: Secondary | ICD-10-CM | POA: Diagnosis present

## 2014-03-09 DIAGNOSIS — A419 Sepsis, unspecified organism: Secondary | ICD-10-CM

## 2014-03-09 HISTORY — DX: Low back pain, unspecified: M54.50

## 2014-03-09 HISTORY — DX: Low back pain: M54.5

## 2014-03-09 HISTORY — DX: Other chronic pain: G89.29

## 2014-03-09 HISTORY — DX: Sick sinus syndrome: I49.5

## 2014-03-09 HISTORY — DX: Essential (primary) hypertension: I10

## 2014-03-09 HISTORY — DX: Gastro-esophageal reflux disease without esophagitis: K21.9

## 2014-03-09 HISTORY — DX: Unspecified osteoarthritis, unspecified site: M19.90

## 2014-03-09 HISTORY — DX: Heart failure, unspecified: I50.9

## 2014-03-09 HISTORY — DX: Type 2 diabetes mellitus without complications: E11.9

## 2014-03-09 LAB — OSMOLALITY, URINE: OSMOLALITY UR: 584 mosm/kg (ref 390–1090)

## 2014-03-09 LAB — URINE MICROSCOPIC-ADD ON

## 2014-03-09 LAB — CBC WITH DIFFERENTIAL/PLATELET
Basophils Absolute: 0 10*3/uL (ref 0.0–0.1)
Basophils Relative: 0 % (ref 0–1)
EOS ABS: 0 10*3/uL (ref 0.0–0.7)
Eosinophils Relative: 0 % (ref 0–5)
HCT: 35.2 % — ABNORMAL LOW (ref 36.0–46.0)
HEMOGLOBIN: 11.6 g/dL — AB (ref 12.0–15.0)
LYMPHS ABS: 0.6 10*3/uL — AB (ref 0.7–4.0)
Lymphocytes Relative: 4 % — ABNORMAL LOW (ref 12–46)
MCH: 31.2 pg (ref 26.0–34.0)
MCHC: 33 g/dL (ref 30.0–36.0)
MCV: 94.6 fL (ref 78.0–100.0)
MONO ABS: 0.6 10*3/uL (ref 0.1–1.0)
Monocytes Relative: 5 % (ref 3–12)
NEUTROS PCT: 91 % — AB (ref 43–77)
Neutro Abs: 12 10*3/uL — ABNORMAL HIGH (ref 1.7–7.7)
Platelets: 241 10*3/uL (ref 150–400)
RBC: 3.72 MIL/uL — AB (ref 3.87–5.11)
RDW: 18.7 % — ABNORMAL HIGH (ref 11.5–15.5)
WBC: 13.2 10*3/uL — ABNORMAL HIGH (ref 4.0–10.5)

## 2014-03-09 LAB — OSMOLALITY: Osmolality: 319 mOsm/kg — ABNORMAL HIGH (ref 275–300)

## 2014-03-09 LAB — URINALYSIS, ROUTINE W REFLEX MICROSCOPIC
Bilirubin Urine: NEGATIVE
GLUCOSE, UA: 100 mg/dL — AB
HGB URINE DIPSTICK: NEGATIVE
KETONES UR: NEGATIVE mg/dL
NITRITE: NEGATIVE
PH: 5 (ref 5.0–8.0)
Protein, ur: 30 mg/dL — AB
SPECIFIC GRAVITY, URINE: 1.022 (ref 1.005–1.030)
Urobilinogen, UA: 0.2 mg/dL (ref 0.0–1.0)

## 2014-03-09 LAB — BASIC METABOLIC PANEL
BUN: 84 mg/dL — ABNORMAL HIGH (ref 6–23)
CHLORIDE: 100 meq/L (ref 96–112)
CO2: 19 mEq/L (ref 19–32)
CREATININE: 2.09 mg/dL — AB (ref 0.50–1.10)
Calcium: 9.2 mg/dL (ref 8.4–10.5)
GFR calc Af Amer: 23 mL/min — ABNORMAL LOW (ref >90)
GFR calc non Af Amer: 20 mL/min — ABNORMAL LOW (ref >90)
GLUCOSE: 248 mg/dL — AB (ref 70–99)
POTASSIUM: 5.8 meq/L — AB (ref 3.7–5.3)
Sodium: 135 mEq/L — ABNORMAL LOW (ref 137–147)

## 2014-03-09 LAB — TROPONIN I: Troponin I: <0.3 ng/mL (ref <0.30)

## 2014-03-09 LAB — POTASSIUM: POTASSIUM: 5.1 meq/L (ref 3.7–5.3)

## 2014-03-09 LAB — SODIUM, URINE, RANDOM: SODIUM UR: 34 meq/L

## 2014-03-09 LAB — CREATININE, URINE, RANDOM: Creatinine, Urine: 75.02 mg/dL

## 2014-03-09 LAB — I-STAT CG4 LACTIC ACID, ED: Lactic Acid, Venous: 1.9 mmol/L (ref 0.5–2.2)

## 2014-03-09 LAB — GLUCOSE, CAPILLARY
Glucose-Capillary: 213 mg/dL — ABNORMAL HIGH (ref 70–99)
Glucose-Capillary: 296 mg/dL — ABNORMAL HIGH (ref 70–99)

## 2014-03-09 MED ORDER — GUAIFENESIN-DM 100-10 MG/5ML PO SYRP
5.0000 mL | ORAL_SOLUTION | ORAL | Status: DC | PRN
  Filled 2014-03-09: qty 5

## 2014-03-09 MED ORDER — ONDANSETRON HCL 4 MG PO TABS: 4.0000 mg | ORAL_TABLET | Freq: Four times a day (QID) | ORAL | Status: DC | PRN

## 2014-03-09 MED ORDER — INSULIN ASPART 100 UNIT/ML ~~LOC~~ SOLN
0.0000 [IU] | Freq: Three times a day (TID) | SUBCUTANEOUS | Status: DC
  Administered 2014-03-10 (×2): 1 [IU] via SUBCUTANEOUS
  Administered 2014-03-10: 3 [IU] via SUBCUTANEOUS
  Administered 2014-03-11: 1 [IU] via SUBCUTANEOUS

## 2014-03-09 MED ORDER — SODIUM CHLORIDE 0.9 % IV SOLN
INTRAVENOUS | Status: DC
Start: 2014-03-09 — End: 2014-03-12
  Administered 2014-03-09 – 2014-03-11 (×4): via INTRAVENOUS

## 2014-03-09 MED ORDER — AMIODARONE HCL 200 MG PO TABS
200.0000 mg | ORAL_TABLET | Freq: Two times a day (BID) | ORAL | Status: DC
  Administered 2014-03-09 – 2014-03-12 (×6): 200 mg via ORAL
  Filled 2014-03-09 (×7): qty 1

## 2014-03-09 MED ORDER — HYDROCODONE-ACETAMINOPHEN 5-325 MG PO TABS
1.0000 | ORAL_TABLET | ORAL | Status: DC | PRN
  Administered 2014-03-09 – 2014-03-11 (×4): 1 via ORAL
  Filled 2014-03-09 (×4): qty 1

## 2014-03-09 MED ORDER — FLUTICASONE PROPIONATE 0.05 % EX CREA: 1.0000 "application " | TOPICAL_CREAM | Freq: Two times a day (BID) | CUTANEOUS | Status: DC

## 2014-03-09 MED ORDER — B COMPLEX-C PO TABS
1.0000 | ORAL_TABLET | Freq: Every day | ORAL | Status: DC
  Administered 2014-03-10 – 2014-03-12 (×3): 1 via ORAL
  Filled 2014-03-09 (×4): qty 1

## 2014-03-09 MED ORDER — GLIPIZIDE 5 MG PO TABS
5.0000 mg | ORAL_TABLET | Freq: Every day | ORAL | Status: DC
  Administered 2014-03-10: 5 mg via ORAL
  Filled 2014-03-09 (×2): qty 1

## 2014-03-09 MED ORDER — DOXYCYCLINE HYCLATE 100 MG PO CAPS: 100.0000 mg | ORAL_CAPSULE | Freq: Two times a day (BID) | ORAL | Status: DC

## 2014-03-09 MED ORDER — ONDANSETRON HCL 4 MG/2ML IJ SOLN: 4.0000 mg | Freq: Four times a day (QID) | INTRAMUSCULAR | Status: DC | PRN

## 2014-03-09 MED ORDER — DABIGATRAN ETEXILATE MESYLATE 75 MG PO CAPS
75.0000 mg | ORAL_CAPSULE | Freq: Two times a day (BID) | ORAL | Status: DC
  Administered 2014-03-09 – 2014-03-12 (×6): 75 mg via ORAL
  Filled 2014-03-09 (×7): qty 1

## 2014-03-09 MED ORDER — SODIUM POLYSTYRENE SULFONATE 15 GM/60ML PO SUSP
30.0000 g | Freq: Once | ORAL | Status: AC
  Administered 2014-03-09: 30 g via ORAL
  Filled 2014-03-09: qty 120

## 2014-03-09 MED ORDER — ACETAMINOPHEN 650 MG RE SUPP: 650.0000 mg | Freq: Four times a day (QID) | RECTAL | Status: DC | PRN

## 2014-03-09 MED ORDER — POLYETHYLENE GLYCOL 3350 17 G PO PACK
17.0000 g | PACK | Freq: Every day | ORAL | Status: DC | PRN
  Filled 2014-03-09: qty 1

## 2014-03-09 MED ORDER — METOPROLOL TARTRATE 50 MG PO TABS
50.0000 mg | ORAL_TABLET | Freq: Two times a day (BID) | ORAL | Status: DC
  Administered 2014-03-09 – 2014-03-12 (×6): 50 mg via ORAL
  Filled 2014-03-09 (×7): qty 1

## 2014-03-09 MED ORDER — ALPRAZOLAM 0.25 MG PO TABS
0.2500 mg | ORAL_TABLET | Freq: Two times a day (BID) | ORAL | Status: DC
  Administered 2014-03-09 – 2014-03-12 (×6): 0.25 mg via ORAL
  Filled 2014-03-09 (×6): qty 1

## 2014-03-09 MED ORDER — ACETAMINOPHEN 325 MG PO TABS: 650.0000 mg | ORAL_TABLET | Freq: Four times a day (QID) | ORAL | Status: DC | PRN

## 2014-03-09 MED ORDER — PANTOPRAZOLE SODIUM 40 MG PO TBEC
40.0000 mg | DELAYED_RELEASE_TABLET | Freq: Every day | ORAL | Status: DC
  Administered 2014-03-10 – 2014-03-12 (×3): 40 mg via ORAL
  Filled 2014-03-09 (×3): qty 1

## 2014-03-09 MED ORDER — DOXYCYCLINE HYCLATE 100 MG PO TABS
100.0000 mg | ORAL_TABLET | Freq: Two times a day (BID) | ORAL | Status: DC
  Administered 2014-03-09 – 2014-03-12 (×6): 100 mg via ORAL
  Filled 2014-03-09 (×7): qty 1

## 2014-03-09 NOTE — H&P (Signed)
Patient Demographics  Sophia Oliver, is a 80 y.o. female  MRN: [id number]   DOB - Apr 22, 1924  Admit Date - 03/09/2014  Outpatient Primary MD for the patient is [name], MD   With History of -  Past Medical History  Diagnosis Date  . Syncope   . Sinus node dysfunction   . S/P cardiac pacemaker procedure     Guidant  . Wide-complex tachycardia   . Cholelithiasis   . Glucose intolerance (impaired glucose tolerance)   . DJD (degenerative joint disease)   . Anxiety   . PONV (postoperative nausea and vomiting)   . Pacemaker   . Chronic kidney disease     frequency  . Breast cancer     left breast - hx of 2003   . Injury of right upper arm     after venipuncture in 3/13 - pt describes pain afterwards- no followup done seen by PCP- no further treatment   . PAF (paroxysmal atrial fibrillation)   . Left bundle branch block   . Tachycardia-bradycardia syndrome   . CHF (congestive heart failure)       Past Surgical History  Procedure Laterality Date  . S/p pacemaker    . Replacement total knee bilateral    . Total shoulder replacement    . Total hip arthroplasty    . Femur fracture surgery    . Hysterectomy, unspecified area    . Masterectomy    . Abdominal hysterectomy    . Breast surgery      left breast - 2003   . Total hip arthroplasty  06/16/2012    Procedure: TOTAL HIP ARTHROPLASTY;  Surgeon: Mauri Pole, MD;  Location: [facility];  Service: Orthopedics;  Laterality: Left;  . Hip closed reduction  06/30/2012    Procedure: CLOSED MANIPULATION HIP;  Surgeon: Mauri Pole, MD;  Location: [facility];  Service: Orthopedics;  Laterality: Left;    in for   Chief Complaint  Patient presents with  . Fall  . Fatigue     HPI  Sophia Oliver  is a 81 y.o. female, history of sinus node dysfunction status post  pacemaker placement, atrial fibrillation on amiodarone along with  Pradaxa all of which [place] EP, breast cancer, DJD, chronic kidney disease stage III baseline creatinine between 1.2-1.5, anxiety, chronic systolic heart failure EF 30%, who lives at home and has been feeling weak for the last 3-4 weeks, also having persistent falls, comes to the hospital after she sustained another fall at home where her knees gave out, she denies losing consciousness, no bowel or bladder incontinence, no seizure like activity noted. Of note recently she had been diagnosed with earlobe cellulitis and placed on doxycycline by PCP.  She denies any headache, no fever chills, no chest pain palpitations cough phlegm shortness of breath, no abdominal pain, no diarrhea or dysuria, no focal weakness.   In the ER her workup was consistent with dehydration, mildly  orthostatic blood pressures, hyperkalemia with acute renal failure, head CT was nonacute with some questionable lytic lesions, I was called to admit the patient.    Review of Systems    In addition to the HPI above,   No Fever-chills, No Headache, No changes with Vision or hearing, No problems swallowing food or Liquids, No Chest pain, Cough or Shortness of Breath, No Abdominal pain, No Nausea or Vommitting, Bowel movements are regular, No Blood in stool or Urine, No dysuria, No new skin rashes or bruises, No new joints pains-aches,  No new weakness, tingling, numbness in any extremity, but weak overall No recent weight gain or loss, No polyuria, polydypsia or polyphagia, No significant Mental Stressors.  A full 10 point Review of Systems was done, except as stated above, all other Review of Systems were negative.   Social History History  Substance Use Topics  . Smoking status: Former Smoker    Types: Cigarettes    Quit date: 11/04/1948  . Smokeless tobacco: Never Used  . Alcohol Use: No      Family History DM in siblings   Prior to  Admission medications   Medication Sig Start Date End Date Taking? Authorizing Provider  acetaminophen (TYLENOL) 325 MG tablet Take 325 mg by mouth every 6 (six) hours as needed for pain.   Yes Historical Provider, MD  ALPRAZolam (XANAX) 0.25 MG tablet Take 1 tablet (0.25 mg total) by mouth 2 (two) times daily. 11/25/12  Yes Geradine Girt, DO  amiodarone (PACERONE) 200 MG tablet Take 200 mg by mouth 2 (two) times daily.   Yes Historical Provider, MD  B Complex-C (B-COMPLEX WITH VITAMIN C) tablet Take 1 tablet by mouth daily.   Yes Historical Provider, MD  dabigatran (PRADAXA) 75 MG CAPS Take 75 mg by mouth 2 (two) times daily.  05/29/11  Yes Deboraha Sprang, MD  diclofenac sodium (VOLTAREN) 1 % GEL Apply 2 g topically 4 (four) times daily. 11/25/12  Yes Geradine Girt, DO  doxycycline (VIBRAMYCIN) 100 MG capsule Take 100 mg by mouth 2 (two) times daily.   Yes Historical Provider, MD  furosemide (LASIX) 20 MG tablet 20 mg.  01/23/14  Yes Historical Provider, MD  glipiZIDE (GLUCOTROL) 5 MG tablet Take 1 tablet (5 mg total) by mouth daily before breakfast. 01/23/14  Yes Charlynne Cousins, MD  metoprolol (LOPRESSOR) 50 MG tablet Take 1 tablet (50 mg total) by mouth 2 (two) times daily. 12/01/13  Yes Deboraha Sprang, MD  pantoprazole (PROTONIX) 40 MG tablet Take 40 mg by mouth daily.   Yes Historical Provider, MD  fluticasone (CUTIVATE) 0.05 % cream Apply 1 application topically 2 (two) times daily.  03/04/14   Historical Provider, MD  nitrofurantoin, macrocrystal-monohydrate, (MACROBID) 100 MG capsule  02/28/14   Historical Provider, MD  spironolactone (ALDACTONE) 25 MG tablet Take 25 mg by mouth daily.  03/05/14   Historical Provider, MD    Allergies  Allergen Reactions  . Codeine Nausea And Vomiting  . Other Nausea And Vomiting    Narcotics  dizziness    Physical Exam  Vitals  Blood pressure 114/70, pulse 92, temperature 98.5 F (36.9 C), temperature source Rectal, resp. rate 18, height _0   (1.575 m), weight 48.535 kg (107 lb), SpO2 100.00%.   1. General rail elderly white female lying in bed in NAD,    2. Normal affect and insight, Not Suicidal or Homicidal, Awake Alert, Oriented X 3.  3. No F.N deficits, ALL C.Nerves Intact,  Strength 5/5 all 4 extremities, Sensation intact all 4 extremities, Plantars down going.  4. Ears and Eyes appear Normal, Conjunctivae clear, PERRLA. Moist Oral Mucosa, both earlobes are red with mild cellulitis on the left earlobe  5. Supple Neck, No JVD, No cervical lymphadenopathy appriciated, No Carotid Bruits.  6. Symmetrical Chest wall movement, Good air movement bilaterally, CTAB.  7. RRR, No Gallops, Rubs or Murmurs, No Parasternal Heave.  8. Positive Bowel Sounds, Abdomen Soft, Non tender, No organomegaly appriciated,No rebound -guarding or rigidity.  9.  No Cyanosis, Normal Skin Turgor, No Skin Rash or Bruise.  10. Good muscle tone,  joints appear normal , no effusions, Normal ROM.  11. No Palpable Lymph Nodes in Neck or Axillae     Data Review  CBC  Recent Labs Lab 03/09/14 1120  WBC 13.2*  HGB 11.6*  HCT 35.2*  PLT 241  MCV 94.6  MCH 31.2  MCHC 33.0  RDW 18.7*  LYMPHSABS 0.6*  MONOABS 0.6  EOSABS 0.0  BASOSABS 0.0   ------------------------------------------------------------------------------------------------------------------  Chemistries   Recent Labs Lab 03/09/14 1120  NA 135*  K 5.8*  CL 100  CO2 19  GLUCOSE 248*  BUN 84*  CREATININE 2.09*  CALCIUM 9.2   ------------------------------------------------------------------------------------------------------------------ estimated creatinine clearance is 14.5 ml/min (by C-G formula based on Cr of 2.09). ------------------------------------------------------------------------------------------------------------------ No results found for this basename: TSH, T4TOTAL, FREET3, T3FREE, THYROIDAB,  in the last 72 hours   Coagulation profile No results  found for this basename: INR, PROTIME,  in the last 168 hours ------------------------------------------------------------------------------------------------------------------- No results found for this basename: DDIMER,  in the last 72 hours -------------------------------------------------------------------------------------------------------------------  Cardiac Enzymes No results found for this basename: CK, CKMB, TROPONINI, MYOGLOBIN,  in the last 168 hours ------------------------------------------------------------------------------------------------------------------ No components found with this basename: POCBNP,    ---------------------------------------------------------------------------------------------------------------  Urinalysis    Component Value Date/Time   COLORURINE YELLOW 03/09/2014 1148   APPEARANCEUR CLEAR 03/09/2014 1148   LABSPEC 1.022 03/09/2014 1148   PHURINE 5.0 03/09/2014 1148   GLUCOSEU 100* 03/09/2014 [place] 03/09/2014 [place] 03/09/2014 [place] 03/09/2014 1148   PROTEINUR 30* 03/09/2014 1148   UROBILINOGEN 0.2 03/09/2014 1148   NITRITE NEGATIVE 03/09/2014 1148   LEUKOCYTESUR SMALL* 03/09/2014 1148    ----------------------------------------------------------------------------------------------------------------  Imaging results:   Dg Chest 2 View  03/09/2014   CLINICAL DATA:  Status post fall.  EXAM: CHEST  2 VIEW  COMPARISON:  Single view of the chest of 11/19/2012.  FINDINGS: There is cardiomegaly and vascular congestion without frank edema. No consolidative process, pneumothorax or effusion is identified. Skin fold projecting over the right chest is noted. No acute bony abnormality is seen. Remote left rib fractures are noted and unchanged. The patient is status post right shoulder replacement with advanced degenerative change about the left shoulder.  IMPRESSION: Cardiomegaly without acute disease.   Electronically  Signed   By: Inge Rise M.D.   On: 03/09/2014 11:37   Ct Head Wo Contrast  03/09/2014   CLINICAL DATA:  Fall.  EXAM: CT HEAD WITHOUT CONTRAST  TECHNIQUE: Contiguous axial images were obtained from the base of the skull through the vertex without intravenous contrast.  COMPARISON:  CT 11/19/2012  FINDINGS: Moderate atrophy. Moderate chronic microvascular ischemic change in the white matter. This is stable.  Negative for acute infarct.  Negative for hemorrhage or mass.  Rounded lytic lesions in the right frontal bone and right parietal bone are unchanged. These are indeterminate  but could be seen with myeloma. Metastatic disease and benign causes are also possible.  IMPRESSION: Atrophy and chronic microvascular ischemia. No acute intracranial abnormality.  Lytic lesions right frontal bone and right parietal bone are stable. Question myeloma   Electronically Signed   By: Franchot Gallo M.D.   On: 03/09/2014 13:04    My personal review of EKG: Rhythm Afib, Rate 93 /min, old LBBB , no Acute ST changes    Assessment & Plan    1. Fall likely due to orthostatic hypotension caused by dehydration with generalized weakness - we'll admit her to a telemetry bed, she has history of paroxysmal atrial fibrillation, will give IV fluids, hold Aldactone, she was due for pacemaker evaluation/intervention tomorrow by her cardiologist Dr. Caryl Comes, will consult. The new to monitor orthostatics. Will have PT evaluate.    2. Acute renal failure and hyperkalemia from dehydration. Hold Aldactone, check urine electrolytes, IV fluids, Kayexalate, telemetry monitor, repeat BMP in the morning, we'll repeat potassium tonight.    3. Diabetes mellitus type 2. Glycemic control appears to be poor, check A1c, continue Glucotrol, and sliding scale insulin. Her gap is borderline but likely due to #2 above. Monitor. No ketones in the urine.    4. Paroxysmal atrial fibrillation/sinus syndrome status post pacemaker. Continue  amiodarone, anticoagulation which is Pradaxa, continue beta blocker and monitor on telemetry. Etiology consulted.    5. Chronic systolic heart failure. EF 30% on echogram which was done Feb of this year. She appears dehydrated, hold diuretics, IV fluids, continue beta blocker and amiodarone    6. Earlobe infection. Continue doxycycline along with topical antibiotic cream.    7. Incidental finding of lytic lesions on head CT scan. Outpatient followup with oncology post discharge for possible multiple myeloma workup.      DVT Prophylaxis Pradaxa  AM Labs Ordered, also please review Full Orders  Family Communication: Admission, patients condition and plan of care including tests being ordered have been discussed with the patient and husband and son who indicate understanding and agree with the plan and Code Status.  Code Status Full  Likely DC to  Home  Condition GUARDED    Time spent in minutes : 40    Thurnell Lose M.D on 03/09/2014 at 1:57 PM  Between 7am to 7pm - Pager - [phone]  After 7pm go to [email or web address] - password [email or web address]  And look for the night coverage person covering me after hours  [facility] Office  [phone]

## 2014-03-09 NOTE — Consult Note (Signed)
CARDIOLOGY CONSULT NOTE  Patient ID: Sophia Oliver MRN: [id number] DOB/AGE: 10-Aug-1924 80 y.o.  Admit date: 03/09/2014 Primary Physician [name], MD Primary Cardiologist Dr. Tomma Lightning. Terrence Dupont.   Chief Complaint  Syncope  HPI:  The patient has a history of LBBB and PAF with sinus node dysfunction and pacemaker placement.  She was seen by Dr. Angelena Form yesterday feeling like her heart was out of rhythm.  She had been feeling shaky and week all over. She was in regular rhythm yesterday.    She came to the ED today with increased weakness.  She has had multiple falls.  She did have a recent UTI.  In the ED she has been orthostatic. She is felt to be dehydrated.   Head CT demonstrated no acute process.  She has had no LOC.  We are consulted because she was due to have her pacemaker interrogated.  She says that she has not been feeling well since the holidays.  She has had progressive weakness.  She says that she cannot stop shaking.  She has fallen a couple of times in the last few days.  She is not having pain or SOB.  She doesn't feel palpitations but thought that her heart might be out of rhythm.  She does not report fevers or chills.  She has had no cough or UTI symptoms.     Past Medical History  Diagnosis Date  . Syncope   . Sinus node dysfunction   . S/P cardiac pacemaker procedure     Guidant  . Wide-complex tachycardia   . Cholelithiasis   . Glucose intolerance (impaired glucose tolerance)   . DJD (degenerative joint disease)   . Anxiety   . PONV (postoperative nausea and vomiting)   . Pacemaker   . Chronic kidney disease     frequency  . Breast cancer     left breast - hx of 2003   . Injury of right upper arm     after venipuncture in 3/13 - pt describes pain afterwards- no followup done seen by PCP- no further treatment   . PAF (paroxysmal atrial fibrillation)   . Left bundle branch block   . Tachycardia-bradycardia syndrome   . CHF (congestive heart failure)     Past  Surgical History  Procedure Laterality Date  . S/p pacemaker    . Replacement total knee bilateral    . Total shoulder replacement    . Total hip arthroplasty    . Femur fracture surgery    . Hysterectomy, unspecified area    . Masterectomy    . Abdominal hysterectomy    . Breast surgery      left breast - 2003   . Total hip arthroplasty  06/16/2012    Procedure: TOTAL HIP ARTHROPLASTY;  Surgeon: Mauri Pole, MD;  Location: [facility];  Service: Orthopedics;  Laterality: Left;  . Hip closed reduction  06/30/2012    Procedure: CLOSED MANIPULATION HIP;  Surgeon: Mauri Pole, MD;  Location: [facility];  Service: Orthopedics;  Laterality: Left;    Allergies  Allergen Reactions  . Codeine Nausea And Vomiting  . Other Nausea And Vomiting    Narcotics  dizziness   Prescriptions prior to admission  Medication Sig Dispense Refill  . acetaminophen (TYLENOL) 325 MG tablet Take 325 mg by mouth every 6 (six) hours as needed for pain.      [name] ALPRAZolam (XANAX) 0.25 MG tablet Take 1 tablet (0.25 mg total) by mouth 2 (two)  times daily.  20 tablet  0  . amiodarone (PACERONE) 200 MG tablet Take 200 mg by mouth 2 (two) times daily.      . B Complex-C (B-COMPLEX WITH VITAMIN C) tablet Take 1 tablet by mouth daily.      . dabigatran (PRADAXA) 75 MG CAPS Take 75 mg by mouth 2 (two) times daily.   60 capsule    . diclofenac sodium (VOLTAREN) 1 % GEL Apply 2 g topically 4 (four) times daily.  1 Tube  0  . doxycycline (VIBRAMYCIN) 100 MG capsule Take 100 mg by mouth 2 (two) times daily.      . furosemide (LASIX) 20 MG tablet 20 mg.       . glipiZIDE (GLUCOTROL) 5 MG tablet Take 1 tablet (5 mg total) by mouth daily before breakfast.  30 tablet  0  . metoprolol (LOPRESSOR) 50 MG tablet Take 1 tablet (50 mg total) by mouth 2 (two) times daily.  60 tablet  6  . pantoprazole (PROTONIX) 40 MG tablet Take 40 mg by mouth daily.      . fluticasone (CUTIVATE) 0.05 % cream Apply 1 application topically 2 (two) times  daily.       . nitrofurantoin, macrocrystal-monohydrate, (MACROBID) 100 MG capsule       . spironolactone (ALDACTONE) 25 MG tablet Take 25 mg by mouth daily.        History reviewed. No pertinent family history.  History   Social History  . Marital Status: Married    Spouse Name: N/A    Number of Children: N/A  . Years of Education: N/A   Occupational History  . Not on file.   Social History Main Topics  . Smoking status: Former Smoker    Types: Cigarettes    Quit date: 11/04/1948  . Smokeless tobacco: Never Used  . Alcohol Use: No  . Drug Use: No  . Sexual Activity: No   Other Topics Concern  . Not on file   Social History Narrative   Married, retired.      ROS:    As stated in the HPI and negative for all other systems.  Physical Exam: Blood pressure 121/83, pulse 93, temperature 98.5 F (36.9 C), temperature source Rectal, resp. rate 23, height 5\' 2"  (1.575 m), weight 107 lb (48.535 kg), SpO2 100.00%. GENERAL:  Frail appearing HEENT:  Pupils equal round and reactive, fundi not visualized, oral mucosa unremarkable NECK:  No jugular venous distention, (positive external JVD), waveform within normal limits, carotid upstroke brisk and symmetric, no bruits, no thyromegaly LYMPHATICS:  No cervical, inguinal adenopathy LUNGS:  Clear to auscultation bilaterally BACK:  No CVA tenderness CHEST:  Left breast reconstruction, right pacemaker. HEART:  PMI not displaced or sustained,S1 and S2 within normal limits, no S3, no S4, no clicks, no rubs, apical systolic murmur early peaking, no diastolic murmurs ABD:  Flat, positive bowel sounds normal in frequency in pitch, no bruits, no rebound, no guarding, no midline pulsatile mass, no hepatomegaly, no splenomegaly EXT:  2 plus pulses throughout, no edema, no cyanosis no clubbing SKIN:  No rashes no nodules NEURO:  Cranial nerves II through XII grossly intact, motor grossly intact throughout PSYCH:  Cognitively intact, oriented to  person place and time  Labs: Lab Results  Component Value Date   BUN 84* 03/09/2014   Lab Results  Component Value Date   CREATININE 2.09* 03/09/2014   Lab Results  Component Value Date   NA 135* 03/09/2014   K  5.1 03/09/2014   CL 100 03/09/2014   CO2 19 03/09/2014   Lab Results  Component Value Date   TROPONINI <0.30 03/09/2014   Lab Results  Component Value Date   WBC 13.2* 03/09/2014   HGB 11.6* 03/09/2014   HCT 35.2* 03/09/2014   MCV 94.6 03/09/2014   PLT 241 03/09/2014    Radiology:   CXR: There is cardiomegaly and vascular congestion without frank edema. No consolidative process, pneumothorax or effusion is identified.  Skin fold projecting over the right chest is noted. No acute bony  abnormality is seen. Remote left rib fractures are noted and  unchanged. The patient is status post right shoulder replacement  with advanced degenerative change about the left shoulder  EKG:  Irregular rhythm (possibly atrial fib) LBBB, I do not appreciate pacing spikes.    ASSESSMENT AND PLAN:   WEAKNESS:  I agree that this is likely not a cardiac cause and related to dehydration.  I agree with plans for gentle hydration.  We will defer the work up of possible etiologies such as infectious to the primary team.  I agree with holding diuretics.    ATRIAL FIB:  EKG appears to be atrial fib now.  (However, this has been difficult to assess on the surface EKG in the past.  She has had atrial tach.)   I reviewed the most recent pacemaker check and I do not see mode switching or pacing (ventricular pacing only 10%).  She apparently at this time was largely in sinus.  Therefore, it is hard to say that her symptoms (which have been progressing for months) are necessarily related to her rhythm.   I agree with continuing amiodarone. (Keeping in mind that perhaps she is not tolerating this).  Repeat a TSH (she had recent liver testing.)  Continue current anticoagulation.  However, I would dose adjust for her current  renal function if it does not improve quickly.   EP will be seeing her in the AM.    CHF:  She does not appear to be volume overloaded and I agree with plans to hold the diuretic.     SignedMinus Breeding 03/09/2014, 6:31 PM

## 2014-03-09 NOTE — ED Notes (Signed)
Per EMS- pt has had 2 falls in the last 2 days last fall 9am today. Pt was recently treated for a UTI and conpleted the antibiotics yesterday. Pt was going up as step and fall onto carpet. No trauma reported by EMS. Denies neck or back pain with EMS.

## 2014-03-09 NOTE — ED Provider Notes (Signed)
CSN: [id number]     Arrival date & time 03/09/14  1010 History   First MD Initiated Contact with Patient 03/09/14 1017     Chief Complaint  Patient presents with  . Fall  . Fatigue      HPI Pt was seen at 1035. Per pt and her family, c/o gradual onset and worsening of persistent generalized weakness for the past several days. Pt has fallen multiple times "because I'm so weak." States she finished PO antibiotic yesterday for a UTI, as dx by her PMD. Pt denies syncope/LOC, no AMS/confusion, no CP/palpitations, no SOB/cough, no abd pain, no N/V/D, no fevers, no rash, no focal motor weakness, no tingling/numbness in extremities.    Past Medical History  Diagnosis Date  . Syncope   . Sinus node dysfunction   . S/P cardiac pacemaker procedure     [employer]  . Wide-complex tachycardia   . Cholelithiasis   . Glucose intolerance (impaired glucose tolerance)   . DJD (degenerative joint disease)   . Anxiety   . PONV (postoperative nausea and vomiting)   . Pacemaker   . Chronic kidney disease     frequency  . Breast cancer     left breast - hx of 2003   . Injury of right upper arm     after venipuncture in 3/13 - pt describes pain afterwards- no followup done seen by PCP- no further treatment   . PAF (paroxysmal atrial fibrillation)   . Left bundle branch block   . Tachycardia-bradycardia syndrome   . CHF (congestive heart failure)    Past Surgical History  Procedure Laterality Date  . S/p pacemaker    . Replacement total knee bilateral    . Total shoulder replacement    . Total hip arthroplasty    . Femur fracture surgery    . Hysterectomy, unspecified area    . Masterectomy    . Abdominal hysterectomy    . Breast surgery      left breast - 2003   . Total hip arthroplasty  06/16/2012    Procedure: TOTAL HIP ARTHROPLASTY;  Surgeon: Mauri Pole, MD;  Location: [facility];  Service: Orthopedics;  Laterality: Left;  . Hip closed reduction  06/30/2012    Procedure: CLOSED MANIPULATION  HIP;  Surgeon: Mauri Pole, MD;  Location: [facility];  Service: Orthopedics;  Laterality: Left;    History  Substance Use Topics  . Smoking status: Former Smoker    Types: Cigarettes    Quit date: 11/04/1948  . Smokeless tobacco: Never Used  . Alcohol Use: No    Review of Systems ROS: Statement: All systems negative except as marked or noted in the HPI; Constitutional: Negative for fever and chills. +generalized weakness/fatigue, frequent falls. ; ; Eyes: Negative for eye pain, redness and discharge. ; ; ENMT: Negative for ear pain, hoarseness, nasal congestion, sinus pressure and sore throat. ; ; Cardiovascular: Negative for chest pain, palpitations, diaphoresis, dyspnea and peripheral edema. ; ; Respiratory: Negative for cough, wheezing and stridor. ; ; Gastrointestinal: Negative for nausea, vomiting, diarrhea, abdominal pain, blood in stool, hematemesis, jaundice and rectal bleeding. . ; ; Genitourinary: Negative for dysuria, flank pain and hematuria. ; ; Musculoskeletal: Negative for back pain and neck pain. Negative for swelling and trauma.; ; Skin: Negative for pruritus, rash, abrasions, blisters, bruising and skin lesion.; ; Neuro: Negative for headache, lightheadedness and neck stiffness. Negative for altered level of consciousness , altered mental status, extremity weakness, paresthesias, involuntary movement, seizure  and syncope.        Allergies  Codeine and Other  Home Medications   Prior to Admission medications   Medication Sig Start Date End Date Taking? Authorizing Provider  acetaminophen (TYLENOL) 325 MG tablet Take 325 mg by mouth every 6 (six) hours as needed for pain.   Yes Historical Provider, MD  ALPRAZolam (XANAX) 0.25 MG tablet Take 1 tablet (0.25 mg total) by mouth 2 (two) times daily. 11/25/12  Yes Geradine Girt, DO  amiodarone (PACERONE) 200 MG tablet Take 200 mg by mouth 2 (two) times daily.   Yes Historical Provider, MD  B Complex-C (B-COMPLEX WITH VITAMIN C)  tablet Take 1 tablet by mouth daily.   Yes Historical Provider, MD  dabigatran (PRADAXA) 75 MG CAPS Take 75 mg by mouth 2 (two) times daily.  05/29/11  Yes Deboraha Sprang, MD  diclofenac sodium (VOLTAREN) 1 % GEL Apply 2 g topically 4 (four) times daily. 11/25/12  Yes Geradine Girt, DO  doxycycline (VIBRAMYCIN) 100 MG capsule Take 100 mg by mouth 2 (two) times daily.   Yes Historical Provider, MD  furosemide (LASIX) 20 MG tablet 20 mg.  01/23/14  Yes Historical Provider, MD  glipiZIDE (GLUCOTROL) 5 MG tablet Take 1 tablet (5 mg total) by mouth daily before breakfast. 01/23/14  Yes Charlynne Cousins, MD  metoprolol (LOPRESSOR) 50 MG tablet Take 1 tablet (50 mg total) by mouth 2 (two) times daily. 12/01/13  Yes Deboraha Sprang, MD  pantoprazole (PROTONIX) 40 MG tablet Take 40 mg by mouth daily.   Yes Historical Provider, MD  fluticasone (CUTIVATE) 0.05 % cream Apply 1 application topically 2 (two) times daily.  03/04/14   Historical Provider, MD  nitrofurantoin, macrocrystal-monohydrate, (MACROBID) 100 MG capsule  02/28/14   Historical Provider, MD  spironolactone (ALDACTONE) 25 MG tablet Take 25 mg by mouth daily.  03/05/14   Historical Provider, MD   BP 114/70  Pulse 92  Temp(Src) 98.5 F (36.9 C) (Rectal)  Resp 18  Ht 5\' 2"  (1.575 m)  Wt 107 lb (48.535 kg)  BMI 19.57 kg/m2  SpO2 100% Physical Exam 1040: Physical examination:  Nursing notes reviewed; Vital signs and O2 SAT reviewed;  Constitutional: Well developed, Well nourished, Well hydrated, In no acute distress; Head:  Normocephalic, atraumatic; Eyes: EOMI, PERRL, No scleral icterus; ENMT: TM's clear bilat. +small scabs noted right and left pinnae with mild surrounding erythema. No drainage, no open wounds, no edema, no ecchymosis. Mouth and pharynx normal, Mucous membranes moist; Neck: Supple, Full range of motion, No lymphadenopathy; Cardiovascular: Regular rate and rhythm, No gallop; Respiratory: Breath sounds clear & equal bilaterally, No  wheezes.  Speaking full sentences with ease, Normal respiratory effort/excursion; Chest: Nontender, Movement normal; Abdomen: Soft, Nontender, Nondistended, Normal bowel sounds; Genitourinary: No CVA tenderness; Extremities: Pulses normal, No tenderness, No edema, No calf edema or asymmetry.; Neuro: AA&Ox3, Major CN grossly intact. No facial droop. Speech clear. No gross focal motor or sensory deficits in extremities.; Skin: Color normal, Warm, Dry.; Psych:  Affect flat, poor eye contact.    ED Course  Procedures     EKG Interpretation   Date/Time:  Wednesday Mar 09 2014 10:34:25 EDT Ventricular Rate:  93 PR Interval:  150 QRS Duration: 171 QT Interval:  428 QTC Calculation: 532 R Axis:   -146 Text Interpretation:  Wide QRS rhythm  irregular rate Baseline wander Left  bundle branch block When compared with ECG of 11/08/2013 No significant  change was found Confirmed  by [facility]  MD, Nunzio Cory [phone]) on 03/09/2014  11:26:40 AM      MDM  MDM Reviewed: previous chart, nursing note and vitals Reviewed previous: labs and ECG Interpretation: labs, ECG, x-ray and CT scan   Results for orders placed during the hospital encounter of 03/09/14  URINALYSIS, ROUTINE W REFLEX MICROSCOPIC      Result Value Ref Range   Color, Urine YELLOW  YELLOW   APPearance CLEAR  CLEAR   Specific Gravity, Urine 1.022  1.005 - 1.030   pH 5.0  5.0 - 8.0   Glucose, UA 100 (*) NEGATIVE mg/dL   Hgb urine dipstick NEGATIVE  NEGATIVE   Bilirubin Urine NEGATIVE  NEGATIVE   Ketones, ur NEGATIVE  NEGATIVE mg/dL   Protein, ur 30 (*) NEGATIVE mg/dL   Urobilinogen, UA 0.2  0.0 - 1.0 mg/dL   Nitrite NEGATIVE  NEGATIVE   Leukocytes, UA SMALL (*) NEGATIVE  CBC WITH DIFFERENTIAL      Result Value Ref Range   WBC 13.2 (*) 4.0 - 10.5 K/uL   RBC 3.72 (*) 3.87 - 5.11 MIL/uL   Hemoglobin 11.6 (*) 12.0 - 15.0 g/dL   HCT 35.2 (*) 36.0 - 46.0 %   MCV 94.6  78.0 - 100.0 fL   MCH 31.2  26.0 - 34.0 pg   MCHC 33.0  30.0 -  36.0 g/dL   RDW 18.7 (*) 11.5 - 15.5 %   Platelets 241  150 - 400 K/uL   Neutrophils Relative % 91 (*) 43 - 77 %   Neutro Abs 12.0 (*) 1.7 - 7.7 K/uL   Lymphocytes Relative 4 (*) 12 - 46 %   Lymphs Abs 0.6 (*) 0.7 - 4.0 K/uL   Monocytes Relative 5  3 - 12 %   Monocytes Absolute 0.6  0.1 - 1.0 K/uL   Eosinophils Relative 0  0 - 5 %   Eosinophils Absolute 0.0  0.0 - 0.7 K/uL   Basophils Relative 0  0 - 1 %   Basophils Absolute 0.0  0.0 - 0.1 K/uL  BASIC METABOLIC PANEL      Result Value Ref Range   Sodium 135 (*) 137 - 147 mEq/L   Potassium 5.8 (*) 3.7 - 5.3 mEq/L   Chloride 100  96 - 112 mEq/L   CO2 19  19 - 32 mEq/L   Glucose, Bld 248 (*) 70 - 99 mg/dL   BUN 84 (*) 6 - 23 mg/dL   Creatinine, Ser 2.09 (*) 0.50 - 1.10 mg/dL   Calcium 9.2  8.4 - 10.5 mg/dL   GFR calc non Af Amer 20 (*) >90 mL/min   GFR calc Af Amer 23 (*) >90 mL/min  URINE MICROSCOPIC-ADD ON      Result Value Ref Range   Squamous Epithelial / LPF FEW (*) RARE   WBC, UA 0-2  <3 WBC/hpf   RBC / HPF 0-2  <3 RBC/hpf   Bacteria, UA FEW (*) RARE  I-STAT CG4 LACTIC ACID, ED      Result Value Ref Range   Lactic Acid, Venous 1.90  0.5 - 2.2 mmol/L   Dg Chest 2 View 03/09/2014   CLINICAL DATA:  Status post fall.  EXAM: CHEST  2 VIEW  COMPARISON:  Single view of the chest of 11/19/2012.  FINDINGS: There is cardiomegaly and vascular congestion without frank edema. No consolidative process, pneumothorax or effusion is identified. Skin fold projecting over the right chest is noted. No acute bony abnormality is seen. Remote  left rib fractures are noted and unchanged. The patient is status post right shoulder replacement with advanced degenerative change about the left shoulder.  IMPRESSION: Cardiomegaly without acute disease.   Electronically Signed   By: Inge Rise M.D.   On: 03/09/2014 11:37   Ct Head Wo Contrast 03/09/2014   CLINICAL DATA:  Fall.  EXAM: CT HEAD WITHOUT CONTRAST  TECHNIQUE: Contiguous axial images were  obtained from the base of the skull through the vertex without intravenous contrast.  COMPARISON:  CT 11/19/2012  FINDINGS: Moderate atrophy. Moderate chronic microvascular ischemic change in the white matter. This is stable.  Negative for acute infarct.  Negative for hemorrhage or mass.  Rounded lytic lesions in the right frontal bone and right parietal bone are unchanged. These are indeterminate but could be seen with myeloma. Metastatic disease and benign causes are also possible.  IMPRESSION: Atrophy and chronic microvascular ischemia. No acute intracranial abnormality.  Lytic lesions right frontal bone and right parietal bone are stable. Question myeloma   Electronically Signed   By: Franchot Gallo M.D.   On: 03/09/2014 13:04    Results for MEGHIN, THIVIERGE (MRN [id number]) as of 03/09/2014 14:08  Ref. Range 01/19/2014 03:45 01/21/2014 05:50 01/22/2014 08:34 03/09/2014 11:20  BUN Latest Range: 6-23 mg/dL 47 (H) 44 (H) 34 (H) 84 (H)  Creatinine Latest Range: 0.50-1.10 mg/dL 1.42 (H) 1.29 (H) 1.23 (H) 2.09 (H)    1320:  Pt orthostatic on VS. BUN/Cr elevated from previous, as well as potassium mildly elevated; judicious IVF given. T/C to [facility]. Candiss Norse, case discussed, including:  HPI, pertinent PM/SHx, VS/PE, dx testing, ED course and treatment:  Agreeable to admit, requests to write temporary orders, obtain observation tele bed to team 10.   Alfonzo Feller, DO 03/11/14 2056

## 2014-03-09 NOTE — ED Notes (Signed)
MD at bedside. 

## 2014-03-09 NOTE — ED Notes (Signed)
Pt states that she had some dizziness with standing. MD aware

## 2014-03-09 NOTE — ED Notes (Signed)
NOTIFIED DR. Thurnell Garbe IN PERSON FOR PATIENTS LAB RESULTS OF CG4+ LACTIC ACID ,05.06.2015.

## 2014-03-10 ENCOUNTER — Encounter: Payer: Medicare Other | Admitting: Cardiology

## 2014-03-10 ENCOUNTER — Inpatient Hospital Stay (HOSPITAL_COMMUNITY): Payer: Medicare Other

## 2014-03-10 ENCOUNTER — Encounter (HOSPITAL_COMMUNITY): Payer: Self-pay | Admitting: Internal Medicine

## 2014-03-10 LAB — GLUCOSE, CAPILLARY
GLUCOSE-CAPILLARY: 148 mg/dL — AB (ref 70–99)
Glucose-Capillary: 218 mg/dL — ABNORMAL HIGH (ref 70–99)
Glucose-Capillary: 142 mg/dL — ABNORMAL HIGH (ref 70–99)
Glucose-Capillary: 134 mg/dL — ABNORMAL HIGH (ref 70–99)

## 2014-03-10 LAB — URINALYSIS, ROUTINE W REFLEX MICROSCOPIC
Bilirubin Urine: NEGATIVE
Glucose, UA: NEGATIVE mg/dL
Hgb urine dipstick: NEGATIVE
Ketones, ur: NEGATIVE mg/dL
NITRITE: NEGATIVE
PH: 5 (ref 5.0–8.0)
Protein, ur: 30 mg/dL — AB
Specific Gravity, Urine: 1.025 (ref 1.005–1.030)
Urobilinogen, UA: 0.2 mg/dL (ref 0.0–1.0)

## 2014-03-10 LAB — URINE CULTURE
COLONY COUNT: NO GROWTH
CULTURE: NO GROWTH

## 2014-03-10 LAB — TSH: TSH: 2.09 u[IU]/mL (ref 0.350–4.500)

## 2014-03-10 LAB — URINE MICROSCOPIC-ADD ON

## 2014-03-10 LAB — CBC
HCT: 30.4 % — ABNORMAL LOW (ref 36.0–46.0)
Hemoglobin: 9.8 g/dL — ABNORMAL LOW (ref 12.0–15.0)
MCH: 30.6 pg (ref 26.0–34.0)
MCHC: 32.2 g/dL (ref 30.0–36.0)
MCV: 95 fL (ref 78.0–100.0)
Platelets: 219 10*3/uL (ref 150–400)
RBC: 3.2 MIL/uL — ABNORMAL LOW (ref 3.87–5.11)
RDW: 19.2 % — AB (ref 11.5–15.5)
WBC: 10.3 10*3/uL (ref 4.0–10.5)

## 2014-03-10 LAB — BASIC METABOLIC PANEL
BUN: 73 mg/dL — AB (ref 6–23)
CALCIUM: 8.2 mg/dL — AB (ref 8.4–10.5)
CHLORIDE: 105 meq/L (ref 96–112)
CO2: 18 meq/L — AB (ref 19–32)
CREATININE: 1.86 mg/dL — AB (ref 0.50–1.10)
GFR calc non Af Amer: 23 mL/min — ABNORMAL LOW (ref >90)
GFR, EST AFRICAN AMERICAN: 27 mL/min — AB (ref >90)
Glucose, Bld: 236 mg/dL — ABNORMAL HIGH (ref 70–99)
Potassium: 4.5 mEq/L (ref 3.7–5.3)
Sodium: 138 mEq/L (ref 137–147)

## 2014-03-10 LAB — HEMOGLOBIN A1C
HEMOGLOBIN A1C: 8.7 % — AB (ref <5.7)
MEAN PLASMA GLUCOSE: 203 mg/dL — AB (ref <117)

## 2014-03-10 MED ORDER — INSULIN GLARGINE 100 UNIT/ML ~~LOC~~ SOLN
20.0000 [IU] | Freq: Every day | SUBCUTANEOUS | Status: DC
  Administered 2014-03-10: 20 [IU] via SUBCUTANEOUS
  Filled 2014-03-10 (×2): qty 0.2

## 2014-03-10 MED ORDER — DOUBLE ANTIBIOTIC 500-10000 UNIT/GM EX OINT
TOPICAL_OINTMENT | Freq: Two times a day (BID) | CUTANEOUS | Status: DC
  Administered 2014-03-10 (×2): via TOPICAL
  Administered 2014-03-11: 1 via TOPICAL
  Administered 2014-03-11 – 2014-03-12 (×2): via TOPICAL

## 2014-03-10 NOTE — Evaluation (Signed)
Physical Therapy Evaluation Patient Details Name: Sophia Oliver MRN: [id number] DOB: 10/19/1924 Today's Date: 03/10/2014   History of Present Illness  feeling weak for the last 3-4 weeks, also having persistent falls, comes to the hospital after she sustained another fall at home where her knees gave out. Pt with bil earlobe infections and bladder infection. Incidental finding of lytic lesion on head CT hx of afib with sinus node dysfunction  Clinical Impression  Pt pleasant but fatigued and reports increased falls and decreased endurance as of late. Pt with multiple falls and currently receiving HHPT. Pt without significant hypotension today or report of dizziness in standing. Pt with need for assist for all transfers and gait and will benefit from acute therapy to maximize mobility, strength, function and transfers to decrease burden of care. Pt reports spouse can assist at home but not present to confirm or clarify level of assist he is able to provide. Recommend OOB daily with staff and encouraged pt to continue HEP she was performing with HHPT    Follow Up Recommendations Supervision for mobility/OOB;SNF, spouse receiving chemo and unable to get pt up when she falls. If he can provide Min assist for transfers and mobility then pt could return home with HHPT if not recommend ST-SNF    Equipment Recommendations  None recommended by PT    Recommendations for Other Services OT consult     Precautions / Restrictions Precautions Precautions: Fall Precaution Comments: incontinent of urine      Mobility  Bed Mobility Overal bed mobility: Needs Assistance Bed Mobility: Rolling;Sidelying to Sit Rolling: Min assist Sidelying to sit: Min assist       General bed mobility comments: cues for sequence with assist to elevate trunk from surface  Transfers Overall transfer level: Needs assistance   Transfers: Sit to/from Stand Sit to Stand: Min assist         General transfer comment:  from bed, recliner and toilet with cues for hand placement and sequence and assist for anterior translation to stand  Ambulation/Gait Ambulation/Gait assistance: Min guard Ambulation Distance (Feet): 30 Feet (15', 30' with seated rest) Assistive device: Rolling walker (2 wheeled) Gait Pattern/deviations: Step-through pattern;Decreased stride length;Trunk flexed   Gait velocity interpretation: Below normal speed for age/gender General Gait Details: cues for posture and position in RW  Stairs            Wheelchair Mobility    Modified Rankin (Stroke Patients Only)       Balance Overall balance assessment: Needs assistance;History of Falls   Sitting balance-Leahy Scale: Fair       Standing balance-Leahy Scale: Poor                               Pertinent Vitals/Pain Orthostatic BPs  Supine 116/71  Sitting 109/60     Standing 110/66          Home Living Family/patient expects to be discharged to:: Private residence Living Arrangements: Spouse/significant other Available Help at Discharge: Available 24 hours/day;Family Type of Home: House Home Access: Stairs to enter Entrance Stairs-Rails: None Entrance Stairs-Number of Steps: 1 Home Layout: Two level;1/2 bath on main level;Able to live on main level with bedroom/bathroom Home Equipment: Gilford Rile - 2 wheels;Bedside commode;Shower seat;Cane - single point      Prior Function Level of Independence: Independent with assistive device(s)         Comments: Amb with RW. stays downstairs except 3-4 days/week  for shower.  Husband S with stairs.     Hand Dominance        Extremity/Trunk Assessment   Upper Extremity Assessment: Generalized weakness           Lower Extremity Assessment: Generalized weakness (pt with bil hip flexion, knee flexion and extension 4/5)      Cervical / Trunk Assessment: Kyphotic  Communication   Communication: No difficulties  Cognition Arousal/Alertness:  Awake/alert Behavior During Therapy: WFL for tasks assessed/performed Overall Cognitive Status: Within Functional Limits for tasks assessed                      General Comments      Exercises        Assessment/Plan    PT Assessment Patient needs continued PT services  PT Diagnosis Difficulty walking;Generalized weakness   PT Problem List Decreased strength;Decreased activity tolerance;Decreased balance;Decreased mobility;Decreased knowledge of use of DME  PT Treatment Interventions Gait training;Stair training;Functional mobility training;Therapeutic activities;Therapeutic exercise;Neuromuscular re-education;DME instruction;Balance training;Patient/family education   PT Goals (Current goals can be found in the Care Plan section) Acute Rehab PT Goals Patient Stated Goal: return home PT Goal Formulation: With patient Time For Goal Achievement: 03/24/14 Potential to Achieve Goals: Fair    Frequency Min 3X/week   Barriers to discharge Decreased caregiver support      Co-evaluation               End of Session Equipment Utilized During Treatment: Gait belt Activity Tolerance: Patient tolerated treatment well Patient left: in chair;with call bell/phone within reach;with chair alarm set Nurse Communication: Mobility status         Time: 5465-6812 PT Time Calculation (min): 29 min   Charges:   PT Evaluation $Initial PT Evaluation Tier I: 1 Procedure PT Treatments $Gait Training: 8-22 mins $Therapeutic Activity: 8-22 mins   PT G Codes:          Keyetta Hollingworth B Kaydence Baba 03/10/2014, 9:58 AM Elwyn Reach, [place]

## 2014-03-10 NOTE — Progress Notes (Signed)
Patient ID: Sophia Oliver  female  DXI:[id number]    DOB: [birth year]    DOA: 03/09/2014  PCP: [name], MD  Assessment/Plan: Principal Problem: Fall likely due to orthostatic hypotension caused by dehydration with generalized weakness, AKI  - Continue telemetry, history of paroxysmal A. fib, pacemaker interrogated today, device appears to be functioning appropriately - Cardiology consulted, patient seen by Dr. Rayann Heman EP today, recommended no further inpatient EP workup - Continue orthostatics, PT evaluate.    Acute renal failure and hyperkalemia from dehydration.  - Continue to hold Aldactone, continue gentle hydration, creatinine improving - Hyperkalemia improved  Diabetes mellitus  - Will hold oral hypoglycemics, placed on insulin, diabetic coordinator following, check HbA1c   Paroxysmal atrial fibrillation/sinus syndrome status post pacemaker. - Per cardiology, follow up in 4 weeks with Dr. Caryl Comes to consider rate control long-term versus repeat cardioversion - - continue pradaxa   Chronic systolic heart failure. -  EF 30% on echo 2/15 - appears dehydrated, hold diuretics, IV fluids, continue beta blocker and amiodarone    Earlobe infection. Continue doxycycline along with topical antibiotic cream.   Incidental finding of lytic lesions on head CT scan. Outpatient followup with oncology post discharge for possible multiple myeloma workup. I will order SPEP, UPEP, beta-2 microglobulin, skeletal bone survey to be followed by PCP and oncology outpatient  DVT Prophylaxis: pradaxa  Code Status: FC  Family Communication: Discussed in detail with the patient's son, Rush Landmark on phone.  Disposition:   Consultants:  cardiology  Procedures:    Antibiotics:  doxycycline  Subjective: Patient feels "puny", weak  Objective: Weight change:   Intake/Output Summary (Last 24 hours) at 03/10/14 1200 Last data filed at 03/09/14 2246  Gross per 24 hour  Intake    240 ml  Output       0 ml  Net    240 ml   Blood pressure 123/70, pulse 75, temperature 98 F (36.7 C), temperature source Oral, resp. rate 18, height '5\' 2"'  (1.575 m), weight 50 kg (110 lb 3.7 oz), SpO2 100.00%.  Physical Exam: General: Alert and awake, oriented x3, not in any acute distress. CVS: S1-S2 clear, no murmur rubs or gallops Chest: clear to auscultation bilaterally, no wheezing, rales or rhonchi Abdomen: soft nontender, nondistended, normal bowel sounds  Extremities: no cyanosis, clubbing or edema noted bilaterally Neuro: Cranial nerves II-XII intact, no focal neurological deficits  Lab Results: Basic Metabolic Panel:  Recent Labs Lab 03/09/14 1120 03/09/14 1659 03/10/14 0548  NA 135*  --  138  K 5.8* 5.1 4.5  CL 100  --  105  CO2 19  --  18*  GLUCOSE 248*  --  236*  BUN 84*  --  73*  CREATININE 2.09*  --  1.86*  CALCIUM 9.2  --  8.2*   Liver Function Tests: No results found for this basename: AST, ALT, ALKPHOS, BILITOT, PROT, ALBUMIN,  in the last 168 hours No results found for this basename: LIPASE, AMYLASE,  in the last 168 hours No results found for this basename: AMMONIA,  in the last 168 hours CBC:  Recent Labs Lab 03/09/14 1120 03/10/14 0548  WBC 13.2* 10.3  NEUTROABS 12.0*  --   HGB 11.6* 9.8*  HCT 35.2* 30.4*  MCV 94.6 95.0  PLT 241 219   Cardiac Enzymes:  Recent Labs Lab 03/09/14 1414  TROPONINI <0.30   BNP: No components found with this basename: POCBNP,  CBG:  Recent Labs Lab 03/09/14 1658 03/09/14 2137 03/10/14  0756 03/10/14 1153  GLUCAP 213* 296* 218* 148*     Micro Results: Recent Results (from the past 240 hour(s))  URINE CULTURE     Status: None   Collection Time    03/09/14 11:48 AM      Result Value Ref Range Status   Specimen Description URINE, CATHETERIZED   Final   Special Requests NONE   Final   Culture  Setup Time     Final   Value: 03/09/2014 17:47     Performed at [place]     Final   Value:  NO GROWTH     Performed at [facility]   Culture     Final   Value: NO GROWTH     Performed at [facility]   Report Status 03/10/2014 FINAL   Final    Studies/Results: Dg Chest 2 View  03/09/2014   CLINICAL DATA:  Status post fall.  EXAM: CHEST  2 VIEW  COMPARISON:  Single view of the chest of 11/19/2012.  FINDINGS: There is cardiomegaly and vascular congestion without frank edema. No consolidative process, pneumothorax or effusion is identified. Skin fold projecting over the right chest is noted. No acute bony abnormality is seen. Remote left rib fractures are noted and unchanged. The patient is status post right shoulder replacement with advanced degenerative change about the left shoulder.  IMPRESSION: Cardiomegaly without acute disease.   Electronically Signed   By: Inge Rise M.D.   On: 03/09/2014 11:37   Ct Head Wo Contrast  03/09/2014   CLINICAL DATA:  Fall.  EXAM: CT HEAD WITHOUT CONTRAST  TECHNIQUE: Contiguous axial images were obtained from the base of the skull through the vertex without intravenous contrast.  COMPARISON:  CT 11/19/2012  FINDINGS: Moderate atrophy. Moderate chronic microvascular ischemic change in the white matter. This is stable.  Negative for acute infarct.  Negative for hemorrhage or mass.  Rounded lytic lesions in the right frontal bone and right parietal bone are unchanged. These are indeterminate but could be seen with myeloma. Metastatic disease and benign causes are also possible.  IMPRESSION: Atrophy and chronic microvascular ischemia. No acute intracranial abnormality.  Lytic lesions right frontal bone and right parietal bone are stable. Question myeloma   Electronically Signed   By: Franchot Gallo M.D.   On: 03/09/2014 13:04    Medications: Scheduled Meds: . ALPRAZolam  0.25 mg Oral BID  . amiodarone  200 mg Oral BID  . B-complex with vitamin C  1 tablet Oral Daily  . dabigatran  75 mg Oral BID  . doxycycline  100 mg Oral Q12H  .  fluticasone  1 application Topical BID  . insulin aspart  0-9 Units Subcutaneous TID WC  . insulin glargine  20 Units Subcutaneous QHS  . metoprolol  50 mg Oral BID  . pantoprazole  40 mg Oral Daily      LOS: 1 day   Ripudeep Krystal Eaton M.D. [facility] 03/10/2014, 12:00 PM Pager: [phone]  If 7PM-7AM, please contact night-coverage [email or web address] Password [email or web address]  **Disclaimer: This note was dictated with voice recognition software. Similar sounding words can inadvertently be transcribed and this note may contain transcription errors which may not have been corrected upon publication of note.**

## 2014-03-10 NOTE — Progress Notes (Signed)
Inpatient Diabetes Program Recommendations  AACE/ADA: New Consensus Statement on Inpatient Glycemic Control  Target Ranges:  Prepandial:   less than 140 mg/dL      Peak postprandial:   less than 180 mg/dL (1-2 hours)      Critically ill patients:  140 - 180 mg/dL  Pager:  [phone] Hours:  8 am-10pm   Reason for Visit: Elevated glucose:  Results for EVAMARIA, DETORE (MRN [id number]) as of 03/10/2014 11:41  Ref. Range 03/09/2014 16:58 03/09/2014 21:37 03/10/2014 07:56  Glucose-Capillary Latest Range: 70-99 mg/dL 213 (H) 296 (H) 218 (H)    Inpatient Diabetes Program Recommendations Insulin - Basal: Consider adding Lantus 20 units daily HgbA1C: Check HgbA1C  Courtney Heys PhD, RN, BC-ADM Diabetes Coordinator  Office:  [phone] Team Pager:  [phone]

## 2014-03-10 NOTE — Consult Note (Addendum)
Primary Care Physician: [name], MD Referring Physician:  Dr Percival [facility] EP: Sophia Oliver is a 80 y.o. female with a h/o sinus node dysfunction and atrial fibrillation s/p prior PPM implant who presents with postural weakness, dizziness, and unsteadiness.  She has had multiple falls.  She has been documented to be orthostatic in the ER with profound prerenal azotemia.  She is in afib but mostly unaware. She has been anticoagulated with pradaxa.  Today, she denies symptoms of palpitations, chest pain, shortness of breath, orthopnea, PND, lower extremity edema,  or neurologic sequela. The patient is otherwise without complaint today.   Past Medical History  Diagnosis Date  . Syncope   . Sinus node dysfunction   . Wide-complex tachycardia   . Cholelithiasis   . Glucose intolerance (impaired glucose tolerance)   . Anxiety   . PONV (postoperative nausea and vomiting)   . Chronic kidney disease     frequency  . Injury of right upper arm     after venipuncture in 3/13 - pt describes pain afterwards- no followup done seen by PCP- no further treatment   . PAF (paroxysmal atrial fibrillation)   . Left bundle branch block   . Tachycardia-bradycardia syndrome   . CHF (congestive heart failure)   . Pacemaker     Guidant  . Type II diabetes mellitus     "recently dx'd" (03/09/2014)  . Breast cancer 2003    "radiation on right; mastectomy on the left"  . Hypertension   . GERD (gastroesophageal reflux disease)   . DJD (degenerative joint disease)   . Arthritis     "real bad in my lower back" (03/09/2014)  . Chronic lower back pain    Past Surgical History  Procedure Laterality Date  . Replacement total knee bilateral Bilateral   . Total shoulder replacement    . Tonsillectomy    . Orif distal femur fracture Right 12/2007  . Tonsillectomy    . Mastectomy Left 2003  . Total hip arthroplasty  06/16/2012    Procedure: TOTAL HIP ARTHROPLASTY;  Surgeon: Mauri Pole,  MD;  Location: [facility];  Service: Orthopedics;  Laterality: Left;  . Hip closed reduction  06/30/2012    Procedure: CLOSED MANIPULATION HIP;  Surgeon: Mauri Pole, MD;  Location: [facility];  Service: Orthopedics;  Laterality: Left;  . Abdominal hysterectomy    . Pacemaker insertion      Guidant Insignia I  . Cataract extraction w/ intraocular lens  implant, bilateral Bilateral 2000's  . Refractive surgery Bilateral 01/2014  . Inguinal hernia repair Left     "had emergency OR for it a few years ago" (03/09/2014)  . Joint replacement    . Breast biopsy Bilateral     Current Facility-Administered Medications  Medication Dose Route Frequency Provider Last Rate Last Dose  . 0.9 %  sodium chloride infusion   Intravenous Continuous Alfonzo Feller, DO 75 mL/hr at 03/10/14 0518    . acetaminophen (TYLENOL) tablet 650 mg  650 mg Oral Q6H PRN Thurnell Lose, MD       Or  . acetaminophen (TYLENOL) suppository 650 mg  650 mg Rectal Q6H PRN Thurnell Lose, MD      . ALPRAZolam Duanne Moron) tablet 0.25 mg  0.25 mg Oral BID Thurnell Lose, MD   0.25 mg at 03/09/14 2231  . amiodarone (PACERONE) tablet 200 mg  200 mg Oral BID Thurnell Lose, MD   200 mg  at 03/09/14 2231  . B-complex with vitamin C tablet 1 tablet  1 tablet Oral Daily Thurnell Lose, MD      . dabigatran (PRADAXA) capsule 75 mg  75 mg Oral BID Thurnell Lose, MD   75 mg at 03/09/14 2231  . doxycycline (VIBRA-TABS) tablet 100 mg  100 mg Oral Q12H Thurnell Lose, MD   100 mg at 03/09/14 2231  . fluticasone (CUTIVATE) 9.79 % cream 1 application  1 application Topical BID Thurnell Lose, MD      . glipiZIDE (GLUCOTROL) tablet 5 mg  5 mg Oral QAC breakfast Thurnell Lose, MD      . guaiFENesin-dextromethorphan (ROBITUSSIN DM) 100-10 MG/5ML syrup 5 mL  5 mL Oral Q4H PRN Thurnell Lose, MD      . HYDROcodone-acetaminophen (NORCO/VICODIN) 5-325 MG per tablet 1-2 tablet  1-2 tablet Oral Q4H PRN Thurnell Lose, MD   1 tablet at  03/10/14 0526  . insulin aspart (novoLOG) injection 0-9 Units  0-9 Units Subcutaneous TID WC Thurnell Lose, MD      . metoprolol (LOPRESSOR) tablet 50 mg  50 mg Oral BID Thurnell Lose, MD   50 mg at 03/09/14 2231  . ondansetron (ZOFRAN) tablet 4 mg  4 mg Oral Q6H PRN Thurnell Lose, MD       Or  . ondansetron (ZOFRAN) injection 4 mg  4 mg Intravenous Q6H PRN Thurnell Lose, MD      . pantoprazole (PROTONIX) EC tablet 40 mg  40 mg Oral Daily Thurnell Lose, MD      . polyethylene glycol (MIRALAX / GLYCOLAX) packet 17 g  17 g Oral Daily PRN Thurnell Lose, MD        Allergies  Allergen Reactions  . Codeine Nausea And Vomiting  . Other Nausea And Vomiting    Narcotics  dizziness    History   Social History  . Marital Status: Married    Spouse Name: N/A    Number of Children: N/A  . Years of Education: N/A   Occupational History  . Not on file.   Social History Main Topics  . Smoking status: Former Smoker -- 0.50 packs/day for 6 years    Types: Cigarettes  . Smokeless tobacco: Never Used  . Alcohol Use: Yes     Comment: "I'll have a social drink maybe at [place]"  . Drug Use: No  . Sexual Activity: No   Other Topics Concern  . Not on file   Social History Narrative   Married, retired.     Family History  Problem Relation Age of Onset  . Hypertension      ROS- All systems are reviewed and negative except as per the HPI above  Physical Exam: Filed Vitals:   03/09/14 1500 03/09/14 1515 03/09/14 2138 03/10/14 0550  BP: 135/74 121/83 108/84 124/70  Pulse: 78 93 83 73  Temp:   97.9 F (36.6 C) 98 F (36.7 C)  TempSrc:   Oral Oral  Resp: 18 23 20 18   Height:      Weight:    110 lb 3.7 oz (50 kg)  SpO2: 98% 100% 92% 100%   HEENT: Pupils equal round and reactive, dry MM  NECK: No jugular venous distention, LUNGS: Clear to auscultation bilaterally  CHEST: Left breast reconstruction, right pacemaker.  HEART: iRRR, apical systolic murmur early  peaking, no diastolic murmurs  ABD: s/nd/nt/ +BS EXT: 2 plus pulses throughout, no  edema, no cyanosis no clubbing  SKIN: No rashes no nodules  NEURO: Cranial nerves II through XII grossly intact, motor grossly intact throughout  PSYCH: Cognitively intact, oriented to person place and time  Labs reviewed Epic records including H&P/ hochreins consult notes/ echo are reviewed EKG- afib with LBBB Tele reveals rate controlled afib  Assessment and Plan:  1. Weakness/ falls/ unsteadiness Due to profound orthostasis and dehydration Would hold diuretics and gently hydrate as you are  2. Atrial fibrillation/ bradycardia Will interrogate her pacemaker today to evaluate pacemaker function Will also look at afib burden.  IF she is persistently in afib then we may consider stopping amiodarone.  I will defer this decision to Dr Caryl Comes. Creatinine clearance is presently 17. Continue pradaxa 75mg  BID with close eye on the CrCl.    Addendum: Pacemaker interrogation is reviewed (see paper chart) Underlying rhythm is afib.  Battery status is 3 years longevity V lead impedance is chronically elevated.  She V paces only 12%. Device appears to be functioning appropriately.  No further inpatient EP workup planned. Will need to follow-up in 4 weeks with Dr Caryl Comes to consider rate control long term vs repeat cardioversion.  I would not recommend cardioversion at this time in the face of her acute medical illness, dehydration, and acute renal failure. Electrophysiology team to see as needed while here. Please call with questions.

## 2014-03-11 DIAGNOSIS — I509 Heart failure, unspecified: Secondary | ICD-10-CM

## 2014-03-11 LAB — HEPATIC FUNCTION PANEL
ALBUMIN: 2.9 g/dL — AB (ref 3.5–5.2)
ALK PHOS: 106 U/L (ref 39–117)
ALT: 23 U/L (ref 0–35)
AST: 22 U/L (ref 0–37)
Total Bilirubin: 0.5 mg/dL (ref 0.3–1.2)
Total Protein: 5.5 g/dL — ABNORMAL LOW (ref 6.0–8.3)

## 2014-03-11 LAB — GLUCOSE, CAPILLARY
GLUCOSE-CAPILLARY: 88 mg/dL (ref 70–99)
Glucose-Capillary: 108 mg/dL — ABNORMAL HIGH (ref 70–99)
Glucose-Capillary: 95 mg/dL (ref 70–99)
Glucose-Capillary: 129 mg/dL — ABNORMAL HIGH (ref 70–99)

## 2014-03-11 LAB — BASIC METABOLIC PANEL
BUN: 66 mg/dL — AB (ref 6–23)
CALCIUM: 8.5 mg/dL (ref 8.4–10.5)
CO2: 18 mEq/L — ABNORMAL LOW (ref 19–32)
Chloride: 106 mEq/L (ref 96–112)
Creatinine, Ser: 1.71 mg/dL — ABNORMAL HIGH (ref 0.50–1.10)
GFR, EST AFRICAN AMERICAN: 30 mL/min — AB (ref >90)
GFR, EST NON AFRICAN AMERICAN: 26 mL/min — AB (ref >90)
GLUCOSE: 62 mg/dL — AB (ref 70–99)
POTASSIUM: 4.1 meq/L (ref 3.7–5.3)
SODIUM: 139 meq/L (ref 137–147)

## 2014-03-11 LAB — URINE CULTURE

## 2014-03-11 MED ORDER — INSULIN GLARGINE 100 UNIT/ML ~~LOC~~ SOLN
15.0000 [IU] | Freq: Every day | SUBCUTANEOUS | Status: DC
  Administered 2014-03-11: 15 [IU] via SUBCUTANEOUS
  Filled 2014-03-11 (×2): qty 0.15

## 2014-03-11 MED ORDER — DEXTROSE 5 % IV SOLN
1.0000 g | INTRAVENOUS | Status: DC
  Administered 2014-03-12: 1 g via INTRAVENOUS
  Filled 2014-03-11 (×2): qty 10

## 2014-03-11 NOTE — Progress Notes (Signed)
Patient ID: Sophia Oliver  female  FBP:[id number]    DOB: [birth year]    DOA: 03/09/2014  PCP: [name], MD  Assessment/Plan: Principal Problem: Fall likely due to orthostatic hypotension caused by dehydration with generalized weakness, AKI  - Continue telemetry, history of paroxysmal A. fib, pacemaker interrogated today, device appears to be functioning appropriately - Per cardiology Dr. Rayann Heman EP, recommended no further inpatient EP workup - Continue orthostatics, PT recommended supervision, home health PT or SNF, patient is not interested in skilled nursing facility at this time   Acute renal failure and hyperkalemia from dehydration.  - Continue to hold Aldactone, continue gentle hydration, cr improving - Hyperkalemia improved  UTI - Follow urine culture and sensitivities, placed on IV Rocephin  Diabetes mellitus  - Continue Lantus decreased to 15 units, hemoglobin A1c 8.4   Paroxysmal atrial fibrillation/sinus syndrome status post pacemaker. - Per cardiology, follow up in 4 weeks with Dr. Caryl Comes to consider rate control long-term versus repeat cardioversion - - continue pradaxa   Chronic systolic heart failure. -  EF 30% on echo 2/15 - appears dehydrated, hold diuretics, IV fluids, continue beta blocker and amiodarone    Earlobe infection. Continue doxycycline along with topical antibiotic cream.   Incidental finding of lytic lesions on head CT scan.  - Outpatient followup with PCP and oncology post discharge for possible multiple myeloma workup.  -  SPEP, UPEP, beta-2 microglobulin, in process - skeletal bone survey negative for any metastatic lesions  DVT Prophylaxis: pradaxa  Code Status: FC  Family Communication:   Disposition: likely DC home in AM  Consultants:  cardiology  Procedures:    Antibiotics:  doxycycline  Subjective: Feels a whole lot better today, denies any dizziness or chest pain  Objective: Weight change: 0.998 kg (2 lb 3.2  oz)  Intake/Output Summary (Last 24 hours) at 03/11/14 1022 Last data filed at 03/11/14 0859  Gross per 24 hour  Intake   1695 ml  Output      0 ml  Net   1695 ml   Blood pressure 125/76, pulse 97, temperature 97.4 F (36.3 C), temperature source Oral, resp. rate 18, height '5\' 2"'  (1.575 m), weight 49.533 kg (109 lb 3.2 oz), SpO2 97.00%.  Physical Exam: General: Alert and awake, oriented x3, not in any acute distress. CVS: S1-S2 clear, no murmur rubs or gallops Chest: clear to auscultation bilaterally, no wheezing, rales or rhonchi Abdomen: soft nontender, nondistended, normal bowel sounds  Extremities: no cyanosis, clubbing or edema noted bilaterally Neuro: Cranial nerves II-XII intact, no focal neurological deficits  Lab Results: Basic Metabolic Panel:  Recent Labs Lab 03/10/14 0548 03/11/14 0604  NA 138 139  K 4.5 4.1  CL 105 106  CO2 18* 18*  GLUCOSE 236* 62*  BUN 73* 66*  CREATININE 1.86* 1.71*  CALCIUM 8.2* 8.5   Liver Function Tests: No results found for this basename: AST, ALT, ALKPHOS, BILITOT, PROT, ALBUMIN,  in the last 168 hours No results found for this basename: LIPASE, AMYLASE,  in the last 168 hours No results found for this basename: AMMONIA,  in the last 168 hours CBC:  Recent Labs Lab 03/09/14 1120 03/10/14 0548  WBC 13.2* 10.3  NEUTROABS 12.0*  --   HGB 11.6* 9.8*  HCT 35.2* 30.4*  MCV 94.6 95.0  PLT 241 219   Cardiac Enzymes:  Recent Labs Lab 03/09/14 1414  TROPONINI <0.30   BNP: No components found with this basename: POCBNP,  CBG:  Recent  Labs Lab 03/10/14 0756 03/10/14 1153 03/10/14 1629 03/10/14 2034 03/11/14 0847  GLUCAP 218* 148* 142* 134* 108*     Micro Results: Recent Results (from the past 240 hour(s))  URINE CULTURE     Status: None   Collection Time    03/09/14 11:48 AM      Result Value Ref Range Status   Specimen Description URINE, CATHETERIZED   Final   Special Requests NONE   Final   Culture  Setup  Time     Final   Value: 03/09/2014 17:47     Performed at [facility]     Final   Value: NO GROWTH     Performed at [facility]   Culture     Final   Value: NO GROWTH     Performed at [facility]   Report Status 03/10/2014 FINAL   Final    Studies/Results: Dg Chest 2 View  03/09/2014   CLINICAL DATA:  Status post fall.  EXAM: CHEST  2 VIEW  COMPARISON:  Single view of the chest of 11/19/2012.  FINDINGS: There is cardiomegaly and vascular congestion without frank edema. No consolidative process, pneumothorax or effusion is identified. Skin fold projecting over the right chest is noted. No acute bony abnormality is seen. Remote left rib fractures are noted and unchanged. The patient is status post right shoulder replacement with advanced degenerative change about the left shoulder.  IMPRESSION: Cardiomegaly without acute disease.   Electronically Signed   By: Inge Rise M.D.   On: 03/09/2014 11:37   Ct Head Wo Contrast  03/09/2014   CLINICAL DATA:  Fall.  EXAM: CT HEAD WITHOUT CONTRAST  TECHNIQUE: Contiguous axial images were obtained from the base of the skull through the vertex without intravenous contrast.  COMPARISON:  CT 11/19/2012  FINDINGS: Moderate atrophy. Moderate chronic microvascular ischemic change in the white matter. This is stable.  Negative for acute infarct.  Negative for hemorrhage or mass.  Rounded lytic lesions in the right frontal bone and right parietal bone are unchanged. These are indeterminate but could be seen with myeloma. Metastatic disease and benign causes are also possible.  IMPRESSION: Atrophy and chronic microvascular ischemia. No acute intracranial abnormality.  Lytic lesions right frontal bone and right parietal bone are stable. Question myeloma   Electronically Signed   By: Franchot Gallo M.D.   On: 03/09/2014 13:04    Medications: Scheduled Meds: . ALPRAZolam  0.25 mg Oral BID  . amiodarone  200 mg Oral BID  .  B-complex with vitamin C  1 tablet Oral Daily  . cefTRIAXone (ROCEPHIN)  IV  1 g Intravenous Q24H  . dabigatran  75 mg Oral BID  . doxycycline  100 mg Oral Q12H  . insulin aspart  0-9 Units Subcutaneous TID WC  . insulin glargine  20 Units Subcutaneous QHS  . metoprolol  50 mg Oral BID  . pantoprazole  40 mg Oral Daily  . polymixin-bacitracin   Topical BID      LOS: 2 days   Aeron Lheureux Krystal Eaton M.D. [facility] 03/11/2014, 10:22 AM Pager: [phone]  If 7PM-7AM, please contact night-coverage [email or web address] Password [email or web address]  **Disclaimer: This note was dictated with voice recognition software. Similar sounding words can inadvertently be transcribed and this note may contain transcription errors which may not have been corrected upon publication of note.**

## 2014-03-11 NOTE — Care Management Note (Signed)
    Page 1 of 2   03/12/2014     3:03:05 PM CARE MANAGEMENT NOTE 03/12/2014  Patient:  Sophia Oliver, Sophia Oliver   Account Number:  [id number]  Date Initiated:  03/11/2014  Documentation initiated by:  GRAVES-BIGELOW,BRENDA  Subjective/Objective Assessment:   Pt admitted for syncope  due to orthostatic hypotension.     Action/Plan:   CM will offer choice for [place]. Per pt she is active with agency- CM did call husband and he was notfamiliar with the agency name. CM did call Dr. Julianne Rice office and office closed at [phone]. Will monitor.   Anticipated DC Date:  03/12/2014   Anticipated DC Plan:  [place]  CM consult      [facility] Choice  HOME HEALTH  Resumption Of Svcs/PTA Provider   Choice offered to / List presented to:          [facility] arranged  HH-2 PT  HH-1 RN  HH-10 DISEASE MANAGEMENT      [place]   Status of service:  Completed, signed off Medicare Important Message given?  YES (If response is "NO", the following Medicare IM given date fields will be blank) Date Medicare IM given:  03/12/2014 Date Additional Medicare IM given:    Discharge Disposition:  [place]  Per UR Regulation:  Reviewed for med. necessity/level of care/duration of stay  If discussed at [place] of Stay Meetings, dates discussed:    Comments:  03/12/14 10:30 CM met with pt and spouse to confirm their [place] is with [place].  EPIC does not reflect IM being received. Pt and spouse verbalized their understanding of the IM from Medicare, signed it and original placed in shadow chart. CM gave pt and copy.  [facility] rep notified of pt discharge and [facility] will be rendering HHPT/OT/RN/aide services.  No other CM needs were communicated.  Mariane Masters, BSN, [place] [phone].  03-11-14 [place], RN, BSN [phone] Pt is active with [facility]. Pt will need resumption orders for [facility] and PT services. SOC to  beign within 24- 48 hrs post d/c. MD please write orders.

## 2014-03-11 NOTE — Progress Notes (Signed)
UR Completed Phylisha Dix Graves-Bigelow, RN,BSN [phone]  

## 2014-03-12 LAB — CBC WITH DIFFERENTIAL/PLATELET
BASOS ABS: 0 10*3/uL (ref 0.0–0.1)
Basophils Relative: 0 % (ref 0–1)
EOS ABS: 0.1 10*3/uL (ref 0.0–0.7)
EOS PCT: 1 % (ref 0–5)
HEMATOCRIT: 33.5 % — AB (ref 36.0–46.0)
Hemoglobin: 10.7 g/dL — ABNORMAL LOW (ref 12.0–15.0)
Lymphocytes Relative: 7 % — ABNORMAL LOW (ref 12–46)
Lymphs Abs: 0.7 10*3/uL (ref 0.7–4.0)
MCH: 31.4 pg (ref 26.0–34.0)
MCHC: 31.9 g/dL (ref 30.0–36.0)
MCV: 98.2 fL (ref 78.0–100.0)
Monocytes Absolute: 0.8 10*3/uL (ref 0.1–1.0)
Monocytes Relative: 8 % (ref 3–12)
Neutro Abs: 8.5 10*3/uL — ABNORMAL HIGH (ref 1.7–7.7)
Neutrophils Relative %: 84 % — ABNORMAL HIGH (ref 43–77)
PLATELETS: 237 10*3/uL (ref 150–400)
RBC: 3.41 MIL/uL — ABNORMAL LOW (ref 3.87–5.11)
RDW: 19.7 % — AB (ref 11.5–15.5)
WBC: 10 10*3/uL (ref 4.0–10.5)

## 2014-03-12 LAB — BASIC METABOLIC PANEL
BUN: 63 mg/dL — AB (ref 6–23)
CALCIUM: 8.3 mg/dL — AB (ref 8.4–10.5)
CHLORIDE: 110 meq/L (ref 96–112)
CO2: 16 meq/L — AB (ref 19–32)
CREATININE: 1.78 mg/dL — AB (ref 0.50–1.10)
GFR calc Af Amer: 28 mL/min — ABNORMAL LOW (ref >90)
GFR calc non Af Amer: 24 mL/min — ABNORMAL LOW (ref >90)
GLUCOSE: 67 mg/dL — AB (ref 70–99)
Potassium: 4.4 mEq/L (ref 3.7–5.3)
Sodium: 141 mEq/L (ref 137–147)

## 2014-03-12 LAB — GLUCOSE, CAPILLARY
GLUCOSE-CAPILLARY: 89 mg/dL (ref 70–99)
Glucose-Capillary: 64 mg/dL — ABNORMAL LOW (ref 70–99)

## 2014-03-12 MED ORDER — NITROFURANTOIN MONOHYD MACRO 100 MG PO CAPS: 100.0000 mg | ORAL_CAPSULE | Freq: Every day | ORAL | Status: DC

## 2014-03-12 MED ORDER — DOUBLE ANTIBIOTIC 500-10000 UNIT/GM EX OINT: 1.0000 "application " | TOPICAL_OINTMENT | Freq: Two times a day (BID) | CUTANEOUS | Status: DC

## 2014-03-12 NOTE — Discharge Summary (Signed)
Physician Discharge Summary  Patient ID: Sophia Oliver MRN: [id number] DOB/AGE: February 14, 1924 80 y.o.  Admit date: 03/09/2014 Discharge date: 03/12/2014  Primary Care Physician:  Sophia Gravel, MD  Discharge Diagnoses:     Fall likely due to orthostatic hypotension caused by dehydration with generalized weakness . Tachy-brady syndrome . acute kidney injury, hyperkalemia, dehydration  . Type II or unspecified type diabetes mellitus without mention of complication, not stated as uncontrolled . Tachy-brady syndrome . PAF (paroxysmal atrial fibrillation) . Pacemaker . Chronic systolic CHF (congestive heart failure) . CKD (chronic kidney disease), stage III   Consults: Cardiology, Sophia Oliver   Recommendations for Outpatient Follow-up:   Please follow aspect, UPEP, multiple myeloma panel that was drawn during this admission Skeletal survey has been negative so far  Please note Lasix and Aldactone has been placed on hold due to orthostasis and until her renal function has improved  Please check BMET at the time of followup appointment.  Allergies:   Allergies  Allergen Reactions  . Codeine Nausea And Vomiting  . Other Nausea And Vomiting    Narcotics  dizziness     Discharge Medications:   Medication List    STOP taking these medications       furosemide 20 MG tablet  Commonly known as:  LASIX     spironolactone 25 MG tablet  Commonly known as:  ALDACTONE      TAKE these medications       acetaminophen 325 MG tablet  Commonly known as:  TYLENOL  Take 325 mg by mouth every 6 (six) hours as needed for pain.     ALPRAZolam 0.25 MG tablet  Commonly known as:  XANAX  Take 1 tablet (0.25 mg total) by mouth 2 (two) times daily.     amiodarone 200 MG tablet  Commonly known as:  PACERONE  Take 200 mg by mouth 2 (two) times daily.     B-complex with vitamin C tablet  Take 1 tablet by mouth daily.     bacitracin-polymyxin b ointment  Commonly known as:  POLYSPORIN   Apply 1 application topically 2 (two) times daily. Apply twice daily to ear lobes.     dabigatran 75 MG Caps capsule  Commonly known as:  PRADAXA  Take 75 mg by mouth 2 (two) times daily.     diclofenac sodium 1 % Gel  Commonly known as:  VOLTAREN  Apply 2 g topically 4 (four) times daily.     doxycycline 100 MG capsule  Commonly known as:  VIBRAMYCIN  Take 100 mg by mouth 2 (two) times daily.     glipiZIDE 5 MG tablet  Commonly known as:  GLUCOTROL  Take 1 tablet (5 mg total) by mouth daily before breakfast.     metoprolol 50 MG tablet  Commonly known as:  LOPRESSOR  Take 1 tablet (50 mg total) by mouth 2 (two) times daily.     nitrofurantoin (macrocrystal-monohydrate) 100 MG capsule  Commonly known as:  MACROBID  Take 1 capsule (100 mg total) by mouth at bedtime.     pantoprazole 40 MG tablet  Commonly known as:  PROTONIX  Take 40 mg by mouth daily.         Brief H and P: For complete details please refer to admission H and P, but in briefRuth Oliver is a 80 y.o. female, history of sinus node dysfunction status post pacemaker placement, atrial fibrillation on amiodarone along with Pradaxa all of which Sophia Oliver EP, breast cancer, DJD, chronic  kidney disease stage III baseline creatinine between 1.2-1.5, anxiety, chronic systolic heart failure EF 30%, who lives at home and has been feeling weak for the last 3-4 weeks, also having persistent falls, Oliver to the hospital after she sustained another fall at home where her knees gave out, she denied losing consciousness, no bowel or bladder incontinence, no seizure like activity noted. Of note recently she had been diagnosed with earlobe cellulitis and placed on doxycycline by PCP.  She denied any headache, no fever chills, no chest pain palpitations cough phlegm shortness of breath, no abdominal pain, no diarrhea or dysuria, no focal weakness.  In the ER her workup was consistent with dehydration, mildly orthostatic blood pressures,  hyperkalemia with acute renal failure, head CT was nonacute with some questionable lytic lesions,    Hospital Course:  This 80 year old female with history of sinusfunction, atrial fibrillation, chronic CHF, diabetes mellitus, prior pacemaker implantation, presented with orthostasis, posterior weakness dizziness and unsteadiness she was documented to be orthostatic in the ER with profound prerenal azotemia, potassium of 5.8 and creatinine of 2.09. Patient was admitted for further workup.  Fall likely due to orthostatic hypotension caused by dehydration with generalized weakness, AKI  Patient was ruled out for acute disease, admitted to telemetry to rule out acute arrhythmias. Cardiology was consulted. Patient was seen by Sophia Oliver and pacemaker was interrogated and device appears to be functioning appropriately  Per cardiology Sophia Oliver EP, recommended no further inpatient EP workup  Diuretics were placed on hold for now.  PT recommended supervision, home health PT or SNF, patient is not interested in skilled nursing facility at this time. Home health PT, OT, RN, HHA was arranged.   Acute renal failure and hyperkalemia from dehydration.  - Continue to hold Aldactone and Lasix patient was provided with gentle hydration, creatinine has been improving, currently 1.7. Hyperkalemia has resolved Please note patient was found to have incidental finding of lytic lesions on her CT scan head hence multiple myeloma workup was ordered, this needs to be followed up by her PCP and hematology referral outpatient if needed. Bone survey was negative for any metastasis.  UTI -urine cultures showed multiple bacterial morphotypes, patient was initially placed on Rocephin. She is now continued on her nitrofurantoin that she takes at home based on her previous cultures.   Diabetes mellitus -  hemoglobin A1c 8.4,   Paroxysmal atrial fibrillation/sinus syndrome status post pacemaker. - Per cardiology, follow up in 4  weeks with Sophia Oliver to consider rate control long-term versus repeat cardioversion . continue pradaxa   Chronic systolic heart failure.  - EF 30% on echo 2/15, continue beta blocker and amiodarone . Currently diuretics are on hold due to orthostasis and acute renal injury, will need to be started based on her evaluation at the followup appointment.   Earlobe infection. Continue doxycycline along with topical antibiotic cream.   Incidental finding of lytic lesions on head CT scan.  - Outpatient followup with PCP and oncology post discharge for possible multiple myeloma workup, SPEP, UPEP, beta-2 microglobulin, in process. Skeletal bone survey is negative for any metastatic lesions   Day of Discharge BP 113/68  Pulse 79  Temp(Src) 97.8 F (36.6 C) (Oral)  Resp 18  Ht '5\' 2"'  (1.575 m)  Wt 51.302 kg (113 lb 1.6 oz)  BMI 20.68 kg/m2  SpO2 98%  Physical Exam: General: Alert and awake oriented x3 not in any acute distress. HEENT: anicteric sclera, pupils reactive to light and accommodation CVS: S1-S2  clear no murmur rubs or gallops Chest: clear to auscultation bilaterally, no wheezing rales or rhonchi Abdomen: soft nontender, nondistended, normal bowel sounds Extremities: no cyanosis, clubbing or edema noted bilaterally Neuro: Cranial nerves II-XII intact, no focal neurological deficits   The results of significant diagnostics from this hospitalization (including imaging, microbiology, ancillary and laboratory) are listed below for reference.    LAB RESULTS: Basic Metabolic Panel:  Recent Labs Lab 03/11/14 0604 03/12/14 0600  NA 139 141  K 4.1 4.4  CL 106 110  CO2 18* 16*  GLUCOSE 62* 67*  BUN 66* 63*  CREATININE 1.71* 1.78*  CALCIUM 8.5 8.3*   Liver Function Tests:  Recent Labs Lab 03/10/14 1310 03/11/14 0604  AST  --  22  ALT  --  23  ALKPHOS  --  106  BILITOT  --  0.5  PROT 5.4* 5.5*  ALBUMIN  --  2.9*   No results found for this basename: LIPASE, AMYLASE,   in the last 168 hours No results found for this basename: AMMONIA,  in the last 168 hours CBC:  Recent Labs Lab 03/10/14 0548 03/12/14 0600  WBC 10.3 10.0  NEUTROABS  --  8.5*  HGB 9.8* 10.7*  HCT 30.4* 33.5*  MCV 95.0 98.2  PLT 219 237   Cardiac Enzymes:  Recent Labs Lab 03/09/14 1414  TROPONINI <0.30   BNP: No components found with this basename: POCBNP,  CBG:  Recent Labs Lab 03/12/14 0746 03/12/14 0839  GLUCAP 64* 89    Significant Diagnostic Studies:  Dg Chest 2 View  03/09/2014   CLINICAL DATA:  Status post fall.  EXAM: CHEST  2 VIEW  COMPARISON:  Single view of the chest of 11/19/2012.  FINDINGS: There is cardiomegaly and vascular congestion without frank edema. No consolidative process, pneumothorax or effusion is identified. Skin fold projecting over the right chest is noted. No acute bony abnormality is seen. Remote left rib fractures are noted and unchanged. The patient is status post right shoulder replacement with advanced degenerative change about the left shoulder.  IMPRESSION: Cardiomegaly without acute disease.   Electronically Signed   By: Inge Rise M.D.   On: 03/09/2014 11:37   Ct Head Wo Contrast  03/09/2014   CLINICAL DATA:  Fall.  EXAM: CT HEAD WITHOUT CONTRAST  TECHNIQUE: Contiguous axial images were obtained from the base of the skull through the vertex without intravenous contrast.  COMPARISON:  CT 11/19/2012  FINDINGS: Moderate atrophy. Moderate chronic microvascular ischemic change in the white matter. This is stable.  Negative for acute infarct.  Negative for hemorrhage or mass.  Rounded lytic lesions in the right frontal bone and right parietal bone are unchanged. These are indeterminate but could be seen with myeloma. Metastatic disease and benign causes are also possible.  IMPRESSION: Atrophy and chronic microvascular ischemia. No acute intracranial abnormality.  Lytic lesions right frontal bone and right parietal bone are stable. Question  myeloma   Electronically Signed   By: Franchot Gallo M.D.   On: 03/09/2014 13:04   Dg Bone Survey Met  03/11/2014   CLINICAL DATA:  Pain.  EXAM: METASTATIC BONE SURVEY  COMPARISON:  Bone scan 02/05/2005.  FINDINGS: Standard metastatic bone survey performed. Images of the axial and appendicular skeleton obtained. No evidence of metastatic disease. No focal lytic or sclerotic bony lesions are noted. Postsurgical changes noted over both shoulders and over the chest. Postsurgical changes left hip and both knees. Severe degenerative changes noted throughout the spine.Peripheral vascular disease.  Cardiomegaly is noted. Cardiac pacer with lead tips in right atrium right ventricle noted.  IMPRESSION: No evidence of metastatic disease.   Electronically Signed   By: Marcello Moores  Register   On: 03/11/2014 07:49      Disposition and Follow-up:     Discharge Orders   Future Appointments Provider Department Dept Phone   04/14/2014 2:30 PM Deboraha Sprang, MD [place] Office [phone]   Future Orders Complete By Expires   Diet Carb Modified  As directed    Discharge instructions  As directed    Increase activity slowly  As directed        DISPOSITION: Home   DIET: Carb modify diet   TESTS THAT NEED FOLLOW-UP BMET   DISCHARGE FOLLOW-UP Follow-up Information   Follow up with Sophia Gravel, MD. Schedule an appointment as soon as possible for a visit in 1 week. (for hospital follow-up, please follow-up on labs/blood work for multiple myeloma )    Specialty:  Internal Medicine   Contact information:   [address] [place] [address] [phone]       Follow up with Virl Axe, MD. Schedule an appointment as soon as possible for a visit in 2 weeks. (for hospital follow-up)    Specialty:  Cardiology   Contact information:   [address] [place] [address] [phone]       Time spent on Discharge: 45 minutes   Signed:   Mendel Corning M.D. [facility] 03/12/2014, 9:51 AM Pager: [phone]   **Disclaimer: This note was dictated with voice recognition software. Similar sounding words can inadvertently be transcribed and this note may contain transcription errors which may not have been corrected upon publication of note.**

## 2014-03-14 LAB — PROTEIN ELECTROPHORESIS, SERUM
Albumin ELP: 57.4 % (ref 55.8–66.1)
Alpha-1-Globulin: 7.4 % — ABNORMAL HIGH (ref 2.9–4.9)
Alpha-2-Globulin: 14.7 % — ABNORMAL HIGH (ref 7.1–11.8)
Beta 2: 3.9 % (ref 3.2–6.5)
Beta Globulin: 7.3 % — ABNORMAL HIGH (ref 4.7–7.2)
Gamma Globulin: 9.3 % — ABNORMAL LOW (ref 11.1–18.8)
M-SPIKE, %: NOT DETECTED g/dL
TOTAL PROTEIN ELP: 5.4 g/dL — AB (ref 6.0–8.3)

## 2014-03-14 LAB — MULTIPLE MYELOMA PANEL, SERUM
Albumin ELP: 57.5 % (ref 55.8–66.1)
Alpha-1-Globulin: 7.3 % — ABNORMAL HIGH (ref 2.9–4.9)
Alpha-2-Globulin: 15.4 % — ABNORMAL HIGH (ref 7.1–11.8)
Beta 2: 4.5 % (ref 3.2–6.5)
Beta Globulin: 7 % (ref 4.7–7.2)
Gamma Globulin: 8.3 % — ABNORMAL LOW (ref 11.1–18.8)
IGA: 61 mg/dL — AB (ref 69–380)
IgG (Immunoglobin G), Serum: 430 mg/dL — ABNORMAL LOW (ref 690–1700)
IgM, Serum: 34 mg/dL — ABNORMAL LOW (ref 52–322)
M-SPIKE, %: NOT DETECTED g/dL
TOTAL PROTEIN: 5.4 g/dL — AB (ref 6.0–8.3)

## 2014-03-14 LAB — UIFE/LIGHT CHAINS/TP QN, 24-HR UR
ALPHA 1 UR: DETECTED — AB
Albumin, U: DETECTED
Alpha 2, Urine: DETECTED — AB
Beta, Urine: DETECTED — AB
FREE KAPPA LT CHAINS, UR: 11.2 mg/dL — AB (ref 0.14–2.42)
FREE LAMBDA LT CHAINS, UR: 0.88 mg/dL — AB (ref 0.02–0.67)
Free Kappa/Lambda Ratio: 12.73 ratio — ABNORMAL HIGH (ref 2.04–10.37)
GAMMA UR: DETECTED — AB
Total Protein, Urine: 19.6 mg/dL

## 2014-03-14 LAB — BETA 2 MICROGLOBULIN, SERUM: Beta-2 Microglobulin: 6.64 mg/L — ABNORMAL HIGH (ref <=2.51)

## 2014-03-15 ENCOUNTER — Emergency Department (HOSPITAL_COMMUNITY): Payer: Medicare Other

## 2014-03-15 ENCOUNTER — Inpatient Hospital Stay (HOSPITAL_COMMUNITY)
Admission: EM | Admit: 2014-03-15 | Discharge: 2014-03-22 | DRG: 690 | Disposition: A | Discharge status: Skilled Nursing Facility | Payer: Medicare Other | Attending: Internal Medicine | Admitting: Internal Medicine

## 2014-03-15 ENCOUNTER — Encounter (HOSPITAL_COMMUNITY): Payer: Self-pay | Admitting: Emergency Medicine

## 2014-03-15 DIAGNOSIS — N183 Chronic kidney disease, stage 3 unspecified: Secondary | ICD-10-CM | POA: Diagnosis present

## 2014-03-15 DIAGNOSIS — J811 Chronic pulmonary edema: Secondary | ICD-10-CM

## 2014-03-15 DIAGNOSIS — F411 Generalized anxiety disorder: Secondary | ICD-10-CM | POA: Diagnosis present

## 2014-03-15 DIAGNOSIS — I509 Heart failure, unspecified: Secondary | ICD-10-CM | POA: Diagnosis present

## 2014-03-15 DIAGNOSIS — Z7901 Long term (current) use of anticoagulants: Secondary | ICD-10-CM

## 2014-03-15 DIAGNOSIS — Z9181 History of falling: Secondary | ICD-10-CM

## 2014-03-15 DIAGNOSIS — T82110A Breakdown (mechanical) of cardiac electrode, initial encounter: Secondary | ICD-10-CM

## 2014-03-15 DIAGNOSIS — Z96659 Presence of unspecified artificial knee joint: Secondary | ICD-10-CM

## 2014-03-15 DIAGNOSIS — E119 Type 2 diabetes mellitus without complications: Secondary | ICD-10-CM | POA: Diagnosis present

## 2014-03-15 DIAGNOSIS — E46 Unspecified protein-calorie malnutrition: Secondary | ICD-10-CM | POA: Diagnosis present

## 2014-03-15 DIAGNOSIS — I480 Paroxysmal atrial fibrillation: Secondary | ICD-10-CM | POA: Diagnosis present

## 2014-03-15 DIAGNOSIS — E1169 Type 2 diabetes mellitus with other specified complication: Secondary | ICD-10-CM | POA: Diagnosis present

## 2014-03-15 DIAGNOSIS — D72829 Elevated white blood cell count, unspecified: Secondary | ICD-10-CM

## 2014-03-15 DIAGNOSIS — M810 Age-related osteoporosis without current pathological fracture: Secondary | ICD-10-CM | POA: Diagnosis present

## 2014-03-15 DIAGNOSIS — R531 Weakness: Secondary | ICD-10-CM

## 2014-03-15 DIAGNOSIS — N390 Urinary tract infection, site not specified: Principal | ICD-10-CM | POA: Diagnosis present

## 2014-03-15 DIAGNOSIS — R5381 Other malaise: Secondary | ICD-10-CM

## 2014-03-15 DIAGNOSIS — I447 Left bundle-branch block, unspecified: Secondary | ICD-10-CM | POA: Diagnosis present

## 2014-03-15 DIAGNOSIS — I129 Hypertensive chronic kidney disease with stage 1 through stage 4 chronic kidney disease, or unspecified chronic kidney disease: Secondary | ICD-10-CM | POA: Diagnosis present

## 2014-03-15 DIAGNOSIS — I495 Sick sinus syndrome: Secondary | ICD-10-CM | POA: Diagnosis present

## 2014-03-15 DIAGNOSIS — I252 Old myocardial infarction: Secondary | ICD-10-CM

## 2014-03-15 DIAGNOSIS — I2789 Other specified pulmonary heart diseases: Secondary | ICD-10-CM | POA: Diagnosis present

## 2014-03-15 DIAGNOSIS — R630 Anorexia: Secondary | ICD-10-CM | POA: Diagnosis present

## 2014-03-15 DIAGNOSIS — Z950 Presence of cardiac pacemaker: Secondary | ICD-10-CM | POA: Diagnosis present

## 2014-03-15 DIAGNOSIS — A498 Other bacterial infections of unspecified site: Secondary | ICD-10-CM | POA: Diagnosis present

## 2014-03-15 DIAGNOSIS — E860 Dehydration: Secondary | ICD-10-CM | POA: Diagnosis present

## 2014-03-15 DIAGNOSIS — R5383 Other fatigue: Secondary | ICD-10-CM

## 2014-03-15 DIAGNOSIS — Z87891 Personal history of nicotine dependence: Secondary | ICD-10-CM

## 2014-03-15 DIAGNOSIS — E875 Hyperkalemia: Secondary | ICD-10-CM

## 2014-03-15 DIAGNOSIS — R55 Syncope and collapse: Secondary | ICD-10-CM | POA: Diagnosis present

## 2014-03-15 DIAGNOSIS — A419 Sepsis, unspecified organism: Secondary | ICD-10-CM

## 2014-03-15 DIAGNOSIS — Z96619 Presence of unspecified artificial shoulder joint: Secondary | ICD-10-CM

## 2014-03-15 DIAGNOSIS — I2510 Atherosclerotic heart disease of native coronary artery without angina pectoris: Secondary | ICD-10-CM | POA: Diagnosis present

## 2014-03-15 DIAGNOSIS — N189 Chronic kidney disease, unspecified: Secondary | ICD-10-CM

## 2014-03-15 DIAGNOSIS — Z96649 Presence of unspecified artificial hip joint: Secondary | ICD-10-CM

## 2014-03-15 DIAGNOSIS — Z853 Personal history of malignant neoplasm of breast: Secondary | ICD-10-CM

## 2014-03-15 DIAGNOSIS — I5022 Chronic systolic (congestive) heart failure: Secondary | ICD-10-CM

## 2014-03-15 DIAGNOSIS — I4891 Unspecified atrial fibrillation: Secondary | ICD-10-CM | POA: Diagnosis present

## 2014-03-15 DIAGNOSIS — I428 Other cardiomyopathies: Secondary | ICD-10-CM | POA: Diagnosis present

## 2014-03-15 DIAGNOSIS — G589 Mononeuropathy, unspecified: Secondary | ICD-10-CM | POA: Diagnosis present

## 2014-03-15 DIAGNOSIS — K219 Gastro-esophageal reflux disease without esophagitis: Secondary | ICD-10-CM | POA: Diagnosis present

## 2014-03-15 HISTORY — DX: Other supraventricular tachycardia: I47.19

## 2014-03-15 HISTORY — DX: Orthostatic hypotension: I95.1

## 2014-03-15 HISTORY — DX: Supraventricular tachycardia: I47.1

## 2014-03-15 LAB — CBC WITH DIFFERENTIAL/PLATELET
Basophils Absolute: 0 10*3/uL (ref 0.0–0.1)
Basophils Relative: 0 % (ref 0–1)
Eosinophils Absolute: 0 10*3/uL (ref 0.0–0.7)
Eosinophils Relative: 0 % (ref 0–5)
HCT: 33.3 % — ABNORMAL LOW (ref 36.0–46.0)
Hemoglobin: 11 g/dL — ABNORMAL LOW (ref 12.0–15.0)
LYMPHS ABS: 0.4 10*3/uL — AB (ref 0.7–4.0)
Lymphocytes Relative: 2 % — ABNORMAL LOW (ref 12–46)
MCH: 31.3 pg (ref 26.0–34.0)
MCHC: 33 g/dL (ref 30.0–36.0)
MCV: 94.9 fL (ref 78.0–100.0)
MONO ABS: 0.4 10*3/uL (ref 0.1–1.0)
Monocytes Relative: 2 % — ABNORMAL LOW (ref 3–12)
Neutro Abs: 19.9 10*3/uL — ABNORMAL HIGH (ref 1.7–7.7)
Neutrophils Relative %: 96 % — ABNORMAL HIGH (ref 43–77)
Platelets: 204 10*3/uL (ref 150–400)
RBC: 3.51 MIL/uL — AB (ref 3.87–5.11)
RDW: 20 % — AB (ref 11.5–15.5)
WBC: 20.8 10*3/uL — ABNORMAL HIGH (ref 4.0–10.5)

## 2014-03-15 LAB — COMPREHENSIVE METABOLIC PANEL
ALT: 30 U/L (ref 0–35)
AST: 20 U/L (ref 0–37)
Albumin: 2.8 g/dL — ABNORMAL LOW (ref 3.5–5.2)
Alkaline Phosphatase: 103 U/L (ref 39–117)
BUN: 59 mg/dL — ABNORMAL HIGH (ref 6–23)
CO2: 17 meq/L — AB (ref 19–32)
CREATININE: 1.74 mg/dL — AB (ref 0.50–1.10)
Calcium: 8.6 mg/dL (ref 8.4–10.5)
Chloride: 106 mEq/L (ref 96–112)
GFR calc Af Amer: 29 mL/min — ABNORMAL LOW (ref >90)
GFR, EST NON AFRICAN AMERICAN: 25 mL/min — AB (ref >90)
Glucose, Bld: 197 mg/dL — ABNORMAL HIGH (ref 70–99)
Potassium: 4.9 mEq/L (ref 3.7–5.3)
Sodium: 137 mEq/L (ref 137–147)
TOTAL PROTEIN: 5.4 g/dL — AB (ref 6.0–8.3)
Total Bilirubin: 0.7 mg/dL (ref 0.3–1.2)

## 2014-03-15 LAB — URINALYSIS, ROUTINE W REFLEX MICROSCOPIC
Glucose, UA: NEGATIVE mg/dL
Hgb urine dipstick: NEGATIVE
Ketones, ur: 15 mg/dL — AB
Nitrite: NEGATIVE
PH: 5 (ref 5.0–8.0)
Protein, ur: NEGATIVE mg/dL
SPECIFIC GRAVITY, URINE: 1.022 (ref 1.005–1.030)
UROBILINOGEN UA: 1 mg/dL (ref 0.0–1.0)

## 2014-03-15 LAB — URINE MICROSCOPIC-ADD ON

## 2014-03-15 LAB — TROPONIN I

## 2014-03-15 LAB — CBG MONITORING, ED: GLUCOSE-CAPILLARY: 142 mg/dL — AB (ref 70–99)

## 2014-03-15 LAB — GLUCOSE, CAPILLARY: Glucose-Capillary: 118 mg/dL — ABNORMAL HIGH (ref 70–99)

## 2014-03-15 MED ORDER — GLIPIZIDE 5 MG PO TABS
5.0000 mg | ORAL_TABLET | Freq: Every day | ORAL | Status: DC
  Administered 2014-03-16: 5 mg via ORAL
  Filled 2014-03-15 (×3): qty 1

## 2014-03-15 MED ORDER — PANTOPRAZOLE SODIUM 40 MG PO TBEC
40.0000 mg | DELAYED_RELEASE_TABLET | Freq: Every day | ORAL | Status: DC
  Administered 2014-03-15 – 2014-03-22 (×8): 40 mg via ORAL
  Filled 2014-03-15 (×8): qty 1

## 2014-03-15 MED ORDER — METOPROLOL TARTRATE 50 MG PO TABS
50.0000 mg | ORAL_TABLET | Freq: Two times a day (BID) | ORAL | Status: DC
  Administered 2014-03-17 – 2014-03-22 (×10): 50 mg via ORAL
  Filled 2014-03-15 (×17): qty 1

## 2014-03-15 MED ORDER — HYDROCODONE-ACETAMINOPHEN 5-325 MG PO TABS
1.0000 | ORAL_TABLET | ORAL | Status: DC | PRN
  Administered 2014-03-18 – 2014-03-21 (×3): 1 via ORAL
  Filled 2014-03-15 (×3): qty 1

## 2014-03-15 MED ORDER — ONDANSETRON HCL 4 MG PO TABS
4.0000 mg | ORAL_TABLET | Freq: Four times a day (QID) | ORAL | Status: DC | PRN
Start: 2014-03-15 — End: 2014-03-22

## 2014-03-15 MED ORDER — AMIODARONE HCL 200 MG PO TABS
200.0000 mg | ORAL_TABLET | Freq: Two times a day (BID) | ORAL | Status: DC
  Administered 2014-03-15: 200 mg via ORAL
  Filled 2014-03-15 (×3): qty 1

## 2014-03-15 MED ORDER — ONDANSETRON HCL 4 MG/2ML IJ SOLN: 4.0000 mg | Freq: Four times a day (QID) | INTRAMUSCULAR | Status: DC | PRN

## 2014-03-15 MED ORDER — B COMPLEX-C PO TABS
1.0000 | ORAL_TABLET | Freq: Every day | ORAL | Status: DC
  Administered 2014-03-15 – 2014-03-22 (×8): 1 via ORAL
  Filled 2014-03-15 (×8): qty 1

## 2014-03-15 MED ORDER — DEXTROSE 5 % IV SOLN
1.0000 g | INTRAVENOUS | Status: DC
  Administered 2014-03-15 – 2014-03-16 (×2): 1 g via INTRAVENOUS
  Filled 2014-03-15 (×3): qty 10

## 2014-03-15 MED ORDER — GUAIFENESIN-DM 100-10 MG/5ML PO SYRP
5.0000 mL | ORAL_SOLUTION | ORAL | Status: DC | PRN
  Filled 2014-03-15: qty 5

## 2014-03-15 MED ORDER — SODIUM CHLORIDE 0.9 % IJ SOLN
3.0000 mL | Freq: Two times a day (BID) | INTRAMUSCULAR | Status: DC
  Administered 2014-03-16 – 2014-03-22 (×12): 3 mL via INTRAVENOUS

## 2014-03-15 MED ORDER — ALPRAZOLAM 0.25 MG PO TABS
0.2500 mg | ORAL_TABLET | Freq: Two times a day (BID) | ORAL | Status: DC
Start: 2014-03-15 — End: 2014-03-22
  Administered 2014-03-15 – 2014-03-22 (×14): 0.25 mg via ORAL
  Filled 2014-03-15 (×14): qty 1

## 2014-03-15 MED ORDER — NITROFURANTOIN MONOHYD MACRO 100 MG PO CAPS
100.0000 mg | ORAL_CAPSULE | Freq: Every day | ORAL | Status: DC
  Administered 2014-03-15 – 2014-03-16 (×2): 100 mg via ORAL
  Filled 2014-03-15 (×3): qty 1

## 2014-03-15 MED ORDER — DABIGATRAN ETEXILATE MESYLATE 75 MG PO CAPS
75.0000 mg | ORAL_CAPSULE | Freq: Two times a day (BID) | ORAL | Status: DC
  Administered 2014-03-15 – 2014-03-22 (×14): 75 mg via ORAL
  Filled 2014-03-15 (×15): qty 1

## 2014-03-15 MED ORDER — SODIUM CHLORIDE 0.9 % IV SOLN
INTRAVENOUS | Status: AC
  Administered 2014-03-15: 22:00:00 via INTRAVENOUS

## 2014-03-15 MED ORDER — BACITRACIN-POLYMYXIN B 500-10000 UNIT/GM EX OINT
1.0000 "application " | TOPICAL_OINTMENT | Freq: Two times a day (BID) | CUTANEOUS | Status: DC
  Administered 2014-03-16 – 2014-03-21 (×10): 1 via TOPICAL

## 2014-03-15 MED ORDER — INSULIN ASPART 100 UNIT/ML ~~LOC~~ SOLN
0.0000 [IU] | Freq: Three times a day (TID) | SUBCUTANEOUS | Status: DC
Start: 2014-03-16 — End: 2014-03-17

## 2014-03-15 MED ORDER — FENTANYL CITRATE 0.05 MG/ML IJ SOLN: 50.0000 ug | Freq: Once | INTRAMUSCULAR | Status: DC

## 2014-03-15 MED ORDER — POLYETHYLENE GLYCOL 3350 17 G PO PACK
17.0000 g | PACK | Freq: Every day | ORAL | Status: DC | PRN
  Filled 2014-03-15: qty 1

## 2014-03-15 MED ORDER — SODIUM CHLORIDE 0.9 % IV BOLUS (SEPSIS)
500.0000 mL | Freq: Once | INTRAVENOUS | Status: AC
  Administered 2014-03-15: 500 mL via INTRAVENOUS

## 2014-03-15 NOTE — ED Notes (Signed)
Called and RN on the phone getting report for another PT coming upstairs-will call us back

## 2014-03-15 NOTE — ED Notes (Signed)
IV team at bedside attempting to get IV placed

## 2014-03-15 NOTE — ED Notes (Signed)
Began IV abx on PT and it infiltrated. Called floor to report PT has no access and they want her to have a new [place] IV placed since IV team was unsuccessful in establishing access. PT also says she has some sores on her ears and was using an OTC cream and would like cream for her ears (she can't remember the name of it though)

## 2014-03-15 NOTE — ED Notes (Signed)
Sent home from hospital on 5/9. Unable to ambulate w/o assistance since leaving the hospital. Was dx'd with CHF and multiple falls. Labs for mult myeloma drawn at primary care, but no results yet. PT came from home and husband has been primary caregiver at home and reports he cant take care of her like this and is hoping for the assistance of a rehab facility so she can regain her strength. PT state she feels very weak and wanted to come back in to be checked out. Per EMS CBG 168, 136/80 and 116/64, p 64.

## 2014-03-15 NOTE — ED Notes (Signed)
Line infiltrated, called floor, called admitting doc who wouldn't placed a picc order so alerted EDP. EDP will try for EJ access shortly. Called pharmacy re: rocephin infiltration and they reported no antidote needed and OK to remove IV catheter. IV catheter removed and Ice placed on site.

## 2014-03-15 NOTE — ED Provider Notes (Addendum)
CSN: [id number]     Arrival date & time 03/15/14  1153 History   First MD Initiated Contact with Patient 03/15/14 1154     Chief Complaint  Patient presents with  . Weakness     (Consider location/radiation/quality/duration/timing/severity/associated sxs/prior Treatment) Patient is a 80 y.o. female presenting with weakness. The history is provided by the patient.  Weakness This is a recurrent problem. The current episode started more than 1 week ago. The problem occurs constantly. The problem has been rapidly worsening. Pertinent negatives include no chest pain, no abdominal pain, no headaches and no shortness of breath. Nothing aggravates the symptoms. Nothing relieves the symptoms. She has tried nothing for the symptoms. The treatment provided no relief.    Past Medical History  Diagnosis Date  . Syncope   . Sinus node dysfunction   . Wide-complex tachycardia   . Cholelithiasis   . Glucose intolerance (impaired glucose tolerance)   . Anxiety   . PONV (postoperative nausea and vomiting)   . Chronic kidney disease     frequency  . Injury of right upper arm     after venipuncture in 3/13 - pt describes pain afterwards- no followup done seen by PCP- no further treatment   . PAF (paroxysmal atrial fibrillation)   . Left bundle branch block   . Tachycardia-bradycardia syndrome   . CHF (congestive heart failure)   . Pacemaker     Guidant  . Type II diabetes mellitus     "recently dx'd" (03/09/2014)  . Breast cancer 2003    "radiation on right; mastectomy on the left"  . Hypertension   . GERD (gastroesophageal reflux disease)   . DJD (degenerative joint disease)   . Arthritis     "real bad in my lower back" (03/09/2014)  . Chronic lower back pain    Past Surgical History  Procedure Laterality Date  . Replacement total knee bilateral Bilateral   . Total shoulder replacement    . Tonsillectomy    . Orif distal femur fracture Right 12/2007  . Tonsillectomy    . Mastectomy Left  2003  . Total hip arthroplasty  06/16/2012    Procedure: TOTAL HIP ARTHROPLASTY;  Surgeon: Mauri Pole, MD;  Location: [facility];  Service: Orthopedics;  Laterality: Left;  . Hip closed reduction  06/30/2012    Procedure: CLOSED MANIPULATION HIP;  Surgeon: Mauri Pole, MD;  Location: [facility];  Service: Orthopedics;  Laterality: Left;  . Abdominal hysterectomy    . Pacemaker insertion      Guidant Insignia I  . Cataract extraction w/ intraocular lens  implant, bilateral Bilateral 2000's  . Refractive surgery Bilateral 01/2014  . Inguinal hernia repair Left     "had emergency OR for it a few years ago" (03/09/2014)  . Joint replacement    . Breast biopsy Bilateral    Family History  Problem Relation Age of Onset  . Hypertension     History  Substance Use Topics  . Smoking status: Former Smoker -- 0.50 packs/day for 6 years    Types: Cigarettes  . Smokeless tobacco: Never Used  . Alcohol Use: Yes     Comment: "I'll have a social drink maybe at [place]"   OB History   Grav Para Term Preterm Abortions TAB SAB Ect Mult Living                 Review of Systems  Constitutional: Negative for fever and fatigue.  HENT: Negative for congestion and drooling.  Eyes: Negative for pain.  Respiratory: Negative for cough and shortness of breath.   Cardiovascular: Negative for chest pain.  Gastrointestinal: Negative for nausea, vomiting, abdominal pain and diarrhea.  Genitourinary: Negative for dysuria and hematuria.  Musculoskeletal: Negative for back pain, gait problem and neck pain.  Skin: Negative for color change.  Neurological: Positive for weakness. Negative for dizziness and headaches.  Hematological: Negative for adenopathy.  Psychiatric/Behavioral: Negative for behavioral problems.  All other systems reviewed and are negative.     Allergies  Codeine and Other  Home Medications   Prior to Admission medications   Medication Sig Start Date End Date Taking? Authorizing  Provider  acetaminophen (TYLENOL) 325 MG tablet Take 325 mg by mouth every 6 (six) hours as needed for pain.    Historical Provider, MD  ALPRAZolam Duanne Moron) 0.25 MG tablet Take 1 tablet (0.25 mg total) by mouth 2 (two) times daily. 11/25/12   Geradine Girt, DO  amiodarone (PACERONE) 200 MG tablet Take 200 mg by mouth 2 (two) times daily.    Historical Provider, MD  B Complex-C (B-COMPLEX WITH VITAMIN C) tablet Take 1 tablet by mouth daily.    Historical Provider, MD  bacitracin-polymyxin b (POLYSPORIN) ointment Apply 1 application topically 2 (two) times daily. Apply twice daily to ear lobes.    Historical Provider, MD  dabigatran (PRADAXA) 75 MG CAPS Take 75 mg by mouth 2 (two) times daily.  05/29/11   Deboraha Sprang, MD  diclofenac sodium (VOLTAREN) 1 % GEL Apply 2 g topically 4 (four) times daily. 11/25/12   Geradine Girt, DO  doxycycline (VIBRAMYCIN) 100 MG capsule Take 100 mg by mouth 2 (two) times daily.    Historical Provider, MD  glipiZIDE (GLUCOTROL) 5 MG tablet Take 1 tablet (5 mg total) by mouth daily before breakfast. 01/23/14   Charlynne Cousins, MD  metoprolol (LOPRESSOR) 50 MG tablet Take 1 tablet (50 mg total) by mouth 2 (two) times daily. 12/01/13   Deboraha Sprang, MD  nitrofurantoin, macrocrystal-monohydrate, (MACROBID) 100 MG capsule Take 1 capsule (100 mg total) by mouth at bedtime. 03/12/14   Ripudeep Krystal Eaton, MD  pantoprazole (PROTONIX) 40 MG tablet Take 40 mg by mouth daily.    Historical Provider, MD   There were no vitals taken for this visit. Physical Exam  Nursing note and vitals reviewed. Constitutional: She is oriented to person, place, and time. She appears well-developed and well-nourished.  HENT:  Head: Normocephalic.  Mouth/Throat: Oropharynx is clear and moist. No oropharyngeal exudate.  Mild erythema and excoriation to the pinna of the mid ear bilaterally.  Eyes: Conjunctivae and EOM are normal. Pupils are equal, round, and reactive to light.  Neck: Normal range of  motion. Neck supple.  Cardiovascular: Normal rate, regular rhythm, normal heart sounds and intact distal pulses.  Exam reveals no gallop and no friction rub.   No murmur heard. Pulmonary/Chest: Effort normal and breath sounds normal. No respiratory distress. She has no wheezes.  Abdominal: Soft. Bowel sounds are normal. There is no tenderness. There is no rebound and no guarding.  Musculoskeletal: Normal range of motion. She exhibits no edema and no tenderness.  Mild bruising and tenderness to palpation of the distal lateral dorsum of the left foot.  Old ecchymosis to the left distal forearm.  Neurological: She is alert and oriented to person, place, and time.  alert, oriented x3 speech: normal in context and clarity memory: intact grossly cranial nerves II-XII: intact motor strength: full proximally and  distally no involuntary movements or tremors sensation: intact to light touch diffusely  cerebellar: finger-to-nose intact gait: deferred d/t weakness   Skin: Skin is warm and dry.  Psychiatric: She has a normal mood and affect. Her behavior is normal.    ED Course  Procedures (including critical care time) Labs Review Labs Reviewed  CBC WITH DIFFERENTIAL - Abnormal; Notable for the following:    WBC 20.8 (*)    RBC 3.51 (*)    Hemoglobin 11.0 (*)    HCT 33.3 (*)    RDW 20.0 (*)    Neutrophils Relative % 96 (*)    Neutro Abs 19.9 (*)    Lymphocytes Relative 2 (*)    Lymphs Abs 0.4 (*)    Monocytes Relative 2 (*)    All other components within normal limits  COMPREHENSIVE METABOLIC PANEL - Abnormal; Notable for the following:    CO2 17 (*)    Glucose, Bld 197 (*)    BUN 59 (*)    Creatinine, Ser 1.74 (*)    Total Protein 5.4 (*)    Albumin 2.8 (*)    GFR calc non Af Amer 25 (*)    GFR calc Af Amer 29 (*)    All other components within normal limits  URINALYSIS, ROUTINE W REFLEX MICROSCOPIC - Abnormal; Notable for the following:    Color, Urine AMBER (*)     APPearance HAZY (*)    Bilirubin Urine SMALL (*)    Ketones, ur 15 (*)    Leukocytes, UA MODERATE (*)    All other components within normal limits  URINE MICROSCOPIC-ADD ON - Abnormal; Notable for the following:    Bacteria, UA MANY (*)    All other components within normal limits  URINE CULTURE  CLOSTRIDIUM DIFFICILE BY PCR  CULTURE, BLOOD (ROUTINE X 2)  CULTURE, BLOOD (ROUTINE X 2)  TROPONIN I    Imaging Review Dg Chest 2 View  03/15/2014   CLINICAL DATA:  Weakness  EXAM: CHEST  2 VIEW  COMPARISON:  Prior radiograph from 11/19/2012  FINDINGS: Right-sided pacemaker/AICD with electrodes overlying the right ventricle and right atrium are unchanged. Cardiomegaly is grossly stable.  There is diffuse pulmonary vascular congestion with indistinctness of the interstitial markings, suggesting mild pulmonary edema. A few scattered Kerley B-lines are noted. Bibasilar atelectasis is present as well. Small bilateral pleural effusions noted. No definite focal infiltrate. There is no pneumothorax.  Right shoulder arthroplasty partially visualized. Severe degenerative changes noted at the left shoulder. Diffuse osteopenia present. No acute osseus abnormality.  IMPRESSION: 1. Cardiomegaly with findings suggestive of mild diffuse pulmonary edema. 2. Small bilateral pleural effusions. 3. Mild bibasilar atelectasis.   Electronically Signed   By: Jeannine Boga M.D.   On: 03/15/2014 14:07   Dg Pelvis 1-2 Views  03/15/2014   CLINICAL DATA:  Weakness over the last 2-3 weeks. Multiple falls. History of breast cancer.  EXAM: PELVIS - 1-2 VIEW  COMPARISON:  None.  FINDINGS: Diffuse osteopenia is present. Left total hip arthroplasty. Atherosclerosis. Partially visualized lumbar spondylosis. The obturator rings appear intact. No fracture is identified. Visualized sacral arcades appear within normal limits.  Old left inferior pubic ramus fracture.  IMPRESSION: Osteopenia.  No acute osseous abnormality.    Electronically Signed   By: Dereck Ligas M.D.   On: 03/15/2014 14:08   Ct Head Wo Contrast  03/15/2014   CLINICAL DATA:  Generalized weakness  EXAM: CT HEAD WITHOUT CONTRAST  CT CERVICAL SPINE WITHOUT CONTRAST  TECHNIQUE:  Multidetector CT imaging of the head and cervical spine was performed following the standard protocol without intravenous contrast. Multiplanar CT image reconstructions of the cervical spine were also generated.  COMPARISON:  CT HEAD W/O CM dated 03/09/2014; CT of the head and cervical spine without contrast dated 11/19/2012  FINDINGS: CT HEAD FINDINGS  There is moderate diffuse cerebral and cerebellar atrophy with compensatory ventriculomegaly. This is not out of proportion to the patient's age. There is decreased density in the deep white matter of both cerebral hemispheres consistent with chronic small vessel ischemic type change. There is no evidence of an acute ischemic infarction. The cerebellum and brainstem are normal in density.  At bone window settings there is soft tissue density material within the right sphenoid sinus which is stable. This may reflect chronic inflammation. There are no sinus air-fluid levels. The mastoid air cells are well pneumatized. There is no evidence of an acute skull fracture. There remain rounded lucencies within the frontal and parietal bones on the right.  CT CERVICAL SPINE FINDINGS  There is stable loss of the normal cervical lordosis. There is stable anterolisthesis of C3 with respect to C4 amounting to approximately 4 mm. There is stable disc space narrowing at C3-4, C4-5, C5-6, and C6-7. The prevertebral soft tissue spaces appear normal. There is no perched facet. There is fusion across the facets at the C4-5 level on the right. The odontoid is intact and the lateral masses of C1 align normally with those of C2. The bony ring at the cervical level is intact. The observed portions of the first and second ribs appear normal. The pulmonary apices are  clear.  IMPRESSION: 1. There is no evidence of an acute ischemic or hemorrhagic infarction. There are age related atrophic changes. There is no intracranial mass effect. 2. There stable lucencies within the right frontal and right parietal bones consistent with the clinically suspected multiple myeloma. 3. There is no evidence of an acute cervical spine fracture nor dislocation. No lytic or blastic vertebral body or posterior element lesion is demonstrated. There are degenerative disc and facet joint changes at multiple cervical levels. Stable grade 1 anterolisthesis of C3 with respect to C4 is present.   Electronically Signed   By: David  [place]   On: 03/15/2014 14:50   Ct Cervical Spine Wo Contrast  03/15/2014   CLINICAL DATA:  Generalized weakness  EXAM: CT HEAD WITHOUT CONTRAST  CT CERVICAL SPINE WITHOUT CONTRAST  TECHNIQUE: Multidetector CT imaging of the head and cervical spine was performed following the standard protocol without intravenous contrast. Multiplanar CT image reconstructions of the cervical spine were also generated.  COMPARISON:  CT HEAD W/O CM dated 03/09/2014; CT of the head and cervical spine without contrast dated 11/19/2012  FINDINGS: CT HEAD FINDINGS  There is moderate diffuse cerebral and cerebellar atrophy with compensatory ventriculomegaly. This is not out of proportion to the patient's age. There is decreased density in the deep white matter of both cerebral hemispheres consistent with chronic small vessel ischemic type change. There is no evidence of an acute ischemic infarction. The cerebellum and brainstem are normal in density.  At bone window settings there is soft tissue density material within the right sphenoid sinus which is stable. This may reflect chronic inflammation. There are no sinus air-fluid levels. The mastoid air cells are well pneumatized. There is no evidence of an acute skull fracture. There remain rounded lucencies within the frontal and parietal bones on the  right.  CT CERVICAL SPINE FINDINGS  There is stable loss of the normal cervical lordosis. There is stable anterolisthesis of C3 with respect to C4 amounting to approximately 4 mm. There is stable disc space narrowing at C3-4, C4-5, C5-6, and C6-7. The prevertebral soft tissue spaces appear normal. There is no perched facet. There is fusion across the facets at the C4-5 level on the right. The odontoid is intact and the lateral masses of C1 align normally with those of C2. The bony ring at the cervical level is intact. The observed portions of the first and second ribs appear normal. The pulmonary apices are clear.  IMPRESSION: 1. There is no evidence of an acute ischemic or hemorrhagic infarction. There are age related atrophic changes. There is no intracranial mass effect. 2. There stable lucencies within the right frontal and right parietal bones consistent with the clinically suspected multiple myeloma. 3. There is no evidence of an acute cervical spine fracture nor dislocation. No lytic or blastic vertebral body or posterior element lesion is demonstrated. There are degenerative disc and facet joint changes at multiple cervical levels. Stable grade 1 anterolisthesis of C3 with respect to C4 is present.   Electronically Signed   By: David  [place]   On: 03/15/2014 14:50   Dg Foot Complete Left  03/15/2014   CLINICAL DATA:  Left foot pain. Lateral foot pain this following a fall.  EXAM: LEFT FOOT - COMPLETE 3+ VIEW  COMPARISON:  None.  FINDINGS: The alignment of the left foot is anatomic. Moderate first MTP joint osteoarthritis. Osteopenia. No displaced fracture is present. Fifth metatarsal appears within normal limits. Diabetic type small vessel atherosclerotic calcification. Second toe PIP joint severe degenerative changes are present, which may represent posttraumatic osteoarthritis or erosive osteoarthritis.  IMPRESSION: No acute osseous abnormality.  Osteopenia.   Electronically Signed   By: Dereck Ligas  M.D.   On: 03/15/2014 14:12     EKG Interpretation   Date/Time:  Tuesday Mar 15 2014 12:14:49 EDT Ventricular Rate:  40 PR Interval:  192 QRS Duration: 146 QT Interval:  632 QTC Calculation: 516 R Axis:   -25 Text Interpretation:  Atrial-ventricular dual-paced rhythm No further  analysis attempted due to paced rhythm Confirmed by Liliana Dang  MD, [place]  ([id number]) on 03/15/2014 12:30:53 PM      Procedure note: Ultrasound Guided Peripheral IV Ultrasound guided 20 g peripheral 1.88 inch angiocath IV placement performed by me. Indications: Nursing unable to place IV. Details: The right antecubital fossa and upper arm was evaluated with a multifrequency linear probe. Several patent brachial veins are noted. 1 attempts were made to cannulate a right basilic vein under realtime US guidance with successful cannulation of the vein and catheter placement. There is return of non-pulsatile dark red blood. The patient tolerated the procedure well without complications.    MDM   Final diagnoses:  Generalized weakness  UTI (lower urinary tract infection)  Leukocytosis  Pulmonary edema    12:29 PM 80 y.o. female w hx of chf, tachy/brady syn s/p pacer, DM, PAF on prodaxa who presents with worsening weakness and recurrent falls at home. Of note she was recently admitted and discharged on 03/12/2014. She denies any fevers, vomiting, diarrhea, or pain. She states she has had multiple falls at home and is having progressively worsening weakness. Will get screening labwork and IV fluid bolus.  UA suspicious for UTI. Consulted triad who will admit. Also consulted cardiology who will see the pt.   Blanchard Kelch, MD 03/15/14 1625  Blanchard Kelch,  MD 03/15/14 1653

## 2014-03-15 NOTE — Progress Notes (Signed)
Drug-Drug Interaction Report  P-glycoprotein Inhibitors / Dabigatran  Significance: Severe  Warning: Pharmacologic effects and plasma concentrations of dabigatran may be increased by amiodarone. The [place] official package labeling states that coadministration of amiodarone and dabigatran does not require a dose adjustment. However, depending on the indication for dabigatran in patients receiving amiodarone, the [place] product monograph recommends a dabigatran dosage adjustment or a dose separation. In addition, renal function-dependent dosage modifications or avoidance may be recommended in official package labeling.  Onset: Delayed Document Level: Possible  Interacting Medications/Orders:  P-glycoprotein Inhibitors Oral or Non-Oral, Systemic Dabigatran Oral, Systemic  1. amiodarone Order ([id number]): amiodarone (PACERONE) tablet 200 mg Route: Oral Start: 03/15/2014 2200 End: none Frequency: 2 times daily 1. dabigatran Order ([id number]): dabigatran (PRADAXA) capsule 75 mg Route: Oral Start: 03/15/2014 2200 End: none Frequency: 2 times daily   Management Code: Potential interaction risk - additional information available  Effects: Pharmacologic effects and plasma concentrations of dabigatran may be increased by amiodarone. The [place] official package labeling states that coadministration of amiodarone and dabigatran does not require a dose adjustment. However, depending on the indication for dabigatran in patients receiving amiodarone, the [place] product monograph recommends a dabigatran dosage adjustment or a dose separation. In addition, renal function-dependent dosage modifications or avoidance may be recommended in official package labeling.  Mechanism: Inhibition of the efflux P-glycoprotein transporter by amiodarone increases the exposure of dabigatran. Other mechanisms may exist.  Management: The [place] official package labeling states that coadministration of amiodarone and dabigatran  does not require a dose adjustment. The [place] product monograph states that a dabigatran dosage adjustment is usually not needed in patients with atrial fibrillation receiving dabigatran for prevention of stroke and systemic embolism that also are receiving amiodarone or verapamil, although dabigatran should be administered at least 2 hours prior to verapamil. However, in patients receiving dabigatran for VTE prophylaxis following knee or hip arthroplasty, the dabigatran dose should be adjusted to 150 mg daily in patients that are receiving concomitant therapy with amiodarone. Instruct patients receiving concurrent amiodarone and dabigatran to contact their healthcare provider if any signs or symptoms of bleeding occur.  Plan:  - Dose adjustment has already been made  - Monitor closely for s/s of bleeding  Rober Minion, PharmD., MS Clinical Pharmacist Pager:  [phone] Thank you for allowing pharmacy to be part of this patients care team.

## 2014-03-15 NOTE — ED Notes (Signed)
IV team coming back to try again for an IV.

## 2014-03-15 NOTE — H&P (Signed)
Patient Demographics  Sophia Oliver, is a 80 y.o. female  MRN: [id number]   DOB - [birth year]  Admit Date - 03/15/2014  Outpatient Primary MD for the patient is [name], MD   With History of -  Past Medical History  Diagnosis Date  . Syncope   . Sinus node dysfunction   . Wide-complex tachycardia   . Cholelithiasis   . Glucose intolerance (impaired glucose tolerance)   . Anxiety   . PONV (postoperative nausea and vomiting)   . Chronic kidney disease     frequency  . Injury of right upper arm     after venipuncture in 3/13 - pt describes pain afterwards- no followup done seen by PCP- no further treatment   . PAF (paroxysmal atrial fibrillation)   . Left bundle branch block   . Tachycardia-bradycardia syndrome   . CHF (congestive heart failure)   . Pacemaker     Guidant  . Type II diabetes mellitus     "recently dx'd" (03/09/2014)  . Breast cancer 2003    "radiation on right; mastectomy on the left"  . Hypertension   . GERD (gastroesophageal reflux disease)   . DJD (degenerative joint disease)   . Arthritis     "real bad in my lower back" (03/09/2014)  . Chronic lower back pain       Past Surgical History  Procedure Laterality Date  . Replacement total knee bilateral Bilateral   . Total shoulder replacement    . Tonsillectomy    . Orif distal femur fracture Right 12/2007  . Tonsillectomy    . Mastectomy Left 2003  . Total hip arthroplasty  06/16/2012    Procedure: TOTAL HIP ARTHROPLASTY;  Surgeon: Mauri Pole, MD;  Location: [facility];  Service: Orthopedics;  Laterality: Left;  . Hip closed reduction  06/30/2012    Procedure: CLOSED MANIPULATION HIP;  Surgeon: Mauri Pole, MD;  Location: [facility];  Service: Orthopedics;  Laterality: Left;  . Abdominal hysterectomy    . Pacemaker insertion     Guidant Insignia I  . Cataract extraction w/ intraocular lens  implant, bilateral Bilateral 2000's  . Refractive surgery Bilateral 01/2014  . Inguinal hernia repair Left     "had emergency OR for it a few years ago" (03/09/2014)  . Joint replacement    . Breast biopsy Bilateral     in for   Chief Complaint  Patient presents with  . Weakness     HPI  Sophia Oliver  is a 80 y.o. female, Sophia Oliver is a 80 y.o. female, history of sinus node dysfunction status post pacemaker placement, atrial fibrillation on amiodarone along with Pradaxa all of which [place] EP, breast cancer, DJD, chronic kidney disease stage III baseline creatinine between 1.2-1.5, anxiety, chronic systolic heart failure EF 30%, who lives at home and has been feeling weak for the last 3-4 weeks, also having persistent falls, comes to the hospital after she sustained  another fall at home where her knees gave out, she was recently hospitalized for a similar complaint at that time she was found to be dehydrated with questionable UTI, after IV fluids and supportive care she improved. She was at that time seen and cleared for discharge by cardiology as well. The ER workup her showed leukocytosis, she is supposed to see oncologist in the outpatient setting for 2 questionable lytic lesions on her skull found last admission, SPEP is borderline was elevated beta-2 microglobulin levels ordered last admission.     She denies losing consciousness, no bowel or bladder incontinence, no seizure like activity noted. Of note recently she had been diagnosed with earlobe cellulitis and placed on doxycycline by PCP.    She denies any headache, no fever chills, no chest pain palpitations cough phlegm shortness of breath, no abdominal pain, no diarrhea or dysuria, no focal weakness.     Review of Systems    In addition to the HPI above,  No Fever-chills, No Headache, No changes with Vision or hearing, No problems swallowing food or Liquids, No  Chest pain, Cough or Shortness of Breath, No Abdominal pain, No Nausea or Vommitting, Bowel movements are regular, No Blood in stool or Urine, No dysuria, No new skin rashes or bruises, No new joints pains-aches,  No new weakness, tingling, numbness in any extremity, +ve generalized weakness No recent weight gain or loss, No polyuria, polydypsia or polyphagia, No significant Mental Stressors.  A full 10 point Review of Systems was done, except as stated above, all other Review of Systems were negative.   Social History History  Substance Use Topics  . Smoking status: Former Smoker -- 0.50 packs/day for 6 years    Types: Cigarettes  . Smokeless tobacco: Never Used  . Alcohol Use: Yes     Comment: "I'll have a social drink maybe at [place]"      Family History Family History  Problem Relation Age of Onset  . Hypertension        Prior to Admission medications   Medication Sig Start Date End Date Taking? Authorizing Provider  acetaminophen (TYLENOL) 325 MG tablet Take 325 mg by mouth every 6 (six) hours as needed for pain.   Yes Historical Provider, MD  ALPRAZolam (XANAX) 0.25 MG tablet Take 1 tablet (0.25 mg total) by mouth 2 (two) times daily. 11/25/12  Yes Geradine Girt, DO  amiodarone (PACERONE) 200 MG tablet Take 200 mg by mouth 2 (two) times daily.   Yes Historical Provider, MD  B Complex-C (B-COMPLEX WITH VITAMIN C) tablet Take 1 tablet by mouth daily.   Yes Historical Provider, MD  bacitracin-polymyxin b (POLYSPORIN) ointment Apply 1 application topically 2 (two) times daily. Apply twice daily to ear lobes.   Yes Historical Provider, MD  dabigatran (PRADAXA) 75 MG CAPS Take 75 mg by mouth 2 (two) times daily.  05/29/11  Yes Deboraha Sprang, MD  diclofenac sodium (VOLTAREN) 1 % GEL Apply 2 g topically 4 (four) times daily. 11/25/12  Yes Geradine Girt, DO  doxycycline (VIBRAMYCIN) 100 MG capsule Take 100 mg by mouth 2 (two) times daily.   Yes Historical Provider, MD    glipiZIDE (GLUCOTROL) 5 MG tablet Take 1 tablet (5 mg total) by mouth daily before breakfast. 01/23/14  Yes Charlynne Cousins, MD  metoprolol (LOPRESSOR) 50 MG tablet Take 1 tablet (50 mg total) by mouth 2 (two) times daily. 12/01/13  Yes Deboraha Sprang, MD  nitrofurantoin, macrocrystal-monohydrate, (MACROBID) 100 MG  capsule Take 1 capsule (100 mg total) by mouth at bedtime. 03/12/14  Yes Ripudeep Krystal Eaton, MD  pantoprazole (PROTONIX) 40 MG tablet Take 40 mg by mouth daily.   Yes Historical Provider, MD    Allergies  Allergen Reactions  . Codeine Nausea And Vomiting  . Other Nausea And Vomiting    Narcotics  dizziness    Physical Exam  Vitals  Blood pressure 122/60, pulse 131, temperature 98.5 F (36.9 C), temperature source Oral, resp. rate 19, SpO2 100.00%.   1. General elderly white female lying in bed in NAD,   bilateral redness in the earlobes left more than right.  2. Normal affect and insight, Not Suicidal or Homicidal, Awake Alert, Oriented X 3.  3. No F.N deficits, ALL C.Nerves Intact, Strength 5/5 all 4 extremities, Sensation intact all 4 extremities, Plantars down going.  4. Ears and Eyes appear Normal, Conjunctivae clear, PERRLA. Moist Oral Mucosa.  5. Supple Neck, No JVD, No cervical lymphadenopathy appriciated, No Carotid Bruits.  6. Symmetrical Chest wall movement, Good air movement bilaterally, CTAB.  7. RRR, No Gallops, Rubs or Murmurs, No Parasternal Heave.  8. Positive Bowel Sounds, Abdomen Soft, Non tender, No organomegaly appriciated,No rebound -guarding or rigidity.  9.  No Cyanosis, Normal Skin Turgor, No Skin Rash or Bruise.  10. Good muscle tone,  joints appear normal , no effusions, Normal ROM.  11. No Palpable Lymph Nodes in Neck or Axillae     Data Review  CBC  Recent Labs Lab 03/09/14 1120 03/10/14 0548 03/12/14 0600 03/15/14 1228  WBC 13.2* 10.3 10.0 20.8*  HGB 11.6* 9.8* 10.7* 11.0*  HCT 35.2* 30.4* 33.5* 33.3*  PLT 241 219 237  204  MCV 94.6 95.0 98.2 94.9  MCH 31.2 30.6 31.4 31.3  MCHC 33.0 32.2 31.9 33.0  RDW 18.7* 19.2* 19.7* 20.0*  LYMPHSABS 0.6*  --  0.7 0.4*  MONOABS 0.6  --  0.8 0.4  EOSABS 0.0  --  0.1 0.0  BASOSABS 0.0  --  0.0 0.0   ------------------------------------------------------------------------------------------------------------------  Chemistries   Recent Labs Lab 03/09/14 1120 03/09/14 1659 03/10/14 0548 03/11/14 0604 03/12/14 0600 03/15/14 1228  NA 135*  --  138 139 141 137  K 5.8* 5.1 4.5 4.1 4.4 4.9  CL 100  --  105 106 110 106  CO2 19  --  18* 18* 16* 17*  GLUCOSE 248*  --  236* 62* 67* 197*  BUN 84*  --  73* 66* 63* 59*  CREATININE 2.09*  --  1.86* 1.71* 1.78* 1.74*  CALCIUM 9.2  --  8.2* 8.5 8.3* 8.6  AST  --   --   --  22  --  20  ALT  --   --   --  23  --  30  ALKPHOS  --   --   --  106  --  103  BILITOT  --   --   --  0.5  --  0.7   ------------------------------------------------------------------------------------------------------------------ CrCl is unknown because both a height and weight (above a minimum accepted value) are required for this calculation. ------------------------------------------------------------------------------------------------------------------ No results found for this basename: TSH, T4TOTAL, FREET3, T3FREE, THYROIDAB,  in the last 72 hours   Coagulation profile No results found for this basename: INR, PROTIME,  in the last 168 hours ------------------------------------------------------------------------------------------------------------------- No results found for this basename: DDIMER,  in the last 72 hours -------------------------------------------------------------------------------------------------------------------  Cardiac Enzymes  Recent Labs Lab 03/09/14 1414 03/15/14 1228  TROPONINI <0.30 <0.30    ------------------------------------------------------------------------------------------------------------------  No components found with this basename: POCBNP,    ---------------------------------------------------------------------------------------------------------------  Urinalysis    Component Value Date/Time   COLORURINE AMBER* 03/15/2014 1458   APPEARANCEUR HAZY* 03/15/2014 1458   LABSPEC 1.022 03/15/2014 1458   PHURINE 5.0 03/15/2014 1458   GLUCOSEU NEGATIVE 03/15/2014 1458   HGBUR NEGATIVE 03/15/2014 1458   BILIRUBINUR SMALL* 03/15/2014 1458   KETONESUR 15* 03/15/2014 1458   PROTEINUR NEGATIVE 03/15/2014 1458   UROBILINOGEN 1.0 03/15/2014 1458   NITRITE NEGATIVE 03/15/2014 1458   LEUKOCYTESUR MODERATE* 03/15/2014 1458   Lab Results  Component Value Date   TSH 2.090 03/10/2014   Lab Results  Component Value Date   HGBA1C 8.7* 03/10/2014    ----------------------------------------------------------------------------------------------------------------  Imaging results:   Dg Chest 2 View  03/15/2014   CLINICAL DATA:  Weakness  EXAM: CHEST  2 VIEW  COMPARISON:  Prior radiograph from 11/19/2012  FINDINGS: Right-sided pacemaker/AICD with electrodes overlying the right ventricle and right atrium are unchanged. Cardiomegaly is grossly stable.  There is diffuse pulmonary vascular congestion with indistinctness of the interstitial markings, suggesting mild pulmonary edema. A few scattered Kerley B-lines are noted. Bibasilar atelectasis is present as well. Small bilateral pleural effusions noted. No definite focal infiltrate. There is no pneumothorax.  Right shoulder arthroplasty partially visualized. Severe degenerative changes noted at the left shoulder. Diffuse osteopenia present. No acute osseus abnormality.  IMPRESSION: 1. Cardiomegaly with findings suggestive of mild diffuse pulmonary edema. 2. Small bilateral pleural effusions. 3. Mild bibasilar atelectasis.   Electronically Signed    By: Jeannine Boga M.D.   On: 03/15/2014 14:07     Dg Pelvis 1-2 Views  03/15/2014   CLINICAL DATA:  Weakness over the last 2-3 weeks. Multiple falls. History of breast cancer.  EXAM: PELVIS - 1-2 VIEW  COMPARISON:  None.  FINDINGS: Diffuse osteopenia is present. Left total hip arthroplasty. Atherosclerosis. Partially visualized lumbar spondylosis. The obturator rings appear intact. No fracture is identified. Visualized sacral arcades appear within normal limits.  Old left inferior pubic ramus fracture.  IMPRESSION: Osteopenia.  No acute osseous abnormality.   Electronically Signed   By: Dereck Ligas M.D.   On: 03/15/2014 14:08          Ct Cervical Spine Wo Contrast  03/15/2014   CLINICAL DATA:  Generalized weakness  EXAM: CT HEAD WITHOUT CONTRAST  CT CERVICAL SPINE WITHOUT CONTRAST  TECHNIQUE: Multidetector CT imaging of the head and cervical spine was performed following the standard protocol without intravenous contrast. Multiplanar CT image reconstructions of the cervical spine were also generated.  COMPARISON:  CT HEAD W/O CM dated 03/09/2014; CT of the head and cervical spine without contrast dated 11/19/2012  FINDINGS: CT HEAD FINDINGS  There is moderate diffuse cerebral and cerebellar atrophy with compensatory ventriculomegaly. This is not out of proportion to the patient's age. There is decreased density in the deep white matter of both cerebral hemispheres consistent with chronic small vessel ischemic type change. There is no evidence of an acute ischemic infarction. The cerebellum and brainstem are normal in density.  At bone window settings there is soft tissue density material within the right sphenoid sinus which is stable. This may reflect chronic inflammation. There are no sinus air-fluid levels. The mastoid air cells are well pneumatized. There is no evidence of an acute skull fracture. There remain rounded lucencies within the frontal and parietal bones on the right.  CT CERVICAL  SPINE FINDINGS  There is stable loss of the normal cervical lordosis. There is stable  anterolisthesis of C3 with respect to C4 amounting to approximately 4 mm. There is stable disc space narrowing at C3-4, C4-5, C5-6, and C6-7. The prevertebral soft tissue spaces appear normal. There is no perched facet. There is fusion across the facets at the C4-5 level on the right. The odontoid is intact and the lateral masses of C1 align normally with those of C2. The bony ring at the cervical level is intact. The observed portions of the first and second ribs appear normal. The pulmonary apices are clear.  IMPRESSION: 1. There is no evidence of an acute ischemic or hemorrhagic infarction. There are age related atrophic changes. There is no intracranial mass effect. 2. There stable lucencies within the right frontal and right parietal bones consistent with the clinically suspected multiple myeloma. 3. There is no evidence of an acute cervical spine fracture nor dislocation. No lytic or blastic vertebral body or posterior element lesion is demonstrated. There are degenerative disc and facet joint changes at multiple cervical levels. Stable grade 1 anterolisthesis of C3 with respect to C4 is present.   Electronically Signed   By: David  [place]   On: 03/15/2014 14:50   Dg Bone Survey Met  03/11/2014   CLINICAL DATA:  Pain.  EXAM: METASTATIC BONE SURVEY  COMPARISON:  Bone scan 02/05/2005.  FINDINGS: Standard metastatic bone survey performed. Images of the axial and appendicular skeleton obtained. No evidence of metastatic disease. No focal lytic or sclerotic bony lesions are noted. Postsurgical changes noted over both shoulders and over the chest. Postsurgical changes left hip and both knees. Severe degenerative changes noted throughout the spine.Peripheral vascular disease. Cardiomegaly is noted. Cardiac pacer with lead tips in right atrium right ventricle noted.  IMPRESSION: No evidence of metastatic disease.   Electronically  Signed   By: [place]   On: 03/11/2014 07:49   Dg Foot Complete Left  03/15/2014   CLINICAL DATA:  Left foot pain. Lateral foot pain this following a fall.  EXAM: LEFT FOOT - COMPLETE 3+ VIEW  COMPARISON:  None.  FINDINGS: The alignment of the left foot is anatomic. Moderate first MTP joint osteoarthritis. Osteopenia. No displaced fracture is present. Fifth metatarsal appears within normal limits. Diabetic type small vessel atherosclerotic calcification. Second toe PIP joint severe degenerative changes are present, which may represent posttraumatic osteoarthritis or erosive osteoarthritis.  IMPRESSION: No acute osseous abnormality.  Osteopenia.   Electronically Signed   By: Dereck Ligas M.D.   On: 03/15/2014 14:12    My personal review of EKG: Rhythm paced Rate  40 /min,  no Acute ST changes    Assessment & Plan   1. Persistent falls with leukocytosis and UTI - he admitted to a telemetry bed, urine and blood cultures, IV Rocephin empirically monitor. PT evaluate, she is agreed for placement this time. Will monitor orthostatics.     2. History of tachybradycardia syndrome, underlying paroxysmal atrial fibrillation, has pacemaker and on anticoagulation. She was due to see an EP physician Dr. Cleda Mccreedy today. She was seen last time by Dr. Rayann Heman who recommended consideration for cardioversion, will have cardiology evaluate, also on EKG heart rate noted to be 40 despite pacemaker. Again cardiology to address. Recent TSH was stable.     3. Diabetes mellitus type 2. Recent A1c was 8.7, home dose Glucotrol continued added insulin sliding scale.     4. Lytic lesions noted on CT scan head last admission and again this admission, SPEP is abnormal patient follow up with oncology for multiple  myeloma workup.     5. Leukocytosis. Secondary to UTI plus possible underlying multiple myeloma. Does not have diarrhea, no cough, however will rule out C. difficile due to recent antibiotic  exposure, treat UTI with Rocephin. Does have ear lobe infection for which she is taking bacitracin ointment which will be continued, she has had doxycycline for more than 7 days we will stop.     6. Chronic systolic CHF with EF 16%. Currently mildly dehydrated despite some fluid congestion on chest x-ray, gentle hydration for 12 hours then stop. Monitor blood pressure and orthostatics.     DVT Prophylaxis Pradaxa  AM Labs Ordered, also please review Full Orders  Family Communication: Admission, patients condition and plan of care including tests being ordered have been discussed with the patient and husband who indicate understanding and agree with the plan and Code Status.  Code Status Full  Likely DC to  SNF  Condition GUARDED    Time spent in minutes : 35    Thurnell Lose M.D on 03/15/2014 at 4:30 PM  Between 7am to 7pm - Pager - [phone]  After 7pm go to [email or web address] - password [email or web address]  And look for the night coverage person covering me after hours  [facility] Office  [phone]

## 2014-03-15 NOTE — ED Notes (Signed)
Unable to start IV. Paged IV team

## 2014-03-15 NOTE — ED Notes (Signed)
Pacing spikes noted when PT's HR drops below 40. Appears to have AV pacing. Pacemaker stops to pace once HR =40

## 2014-03-15 NOTE — ED Notes (Signed)
IV team nurse tried multiple times to get IV and unable to establish access

## 2014-03-16 DIAGNOSIS — I5023 Acute on chronic systolic (congestive) heart failure: Secondary | ICD-10-CM

## 2014-03-16 DIAGNOSIS — I428 Other cardiomyopathies: Secondary | ICD-10-CM

## 2014-03-16 DIAGNOSIS — N390 Urinary tract infection, site not specified: Principal | ICD-10-CM

## 2014-03-16 LAB — CBC
HCT: 30.5 % — ABNORMAL LOW (ref 36.0–46.0)
Hemoglobin: 9.9 g/dL — ABNORMAL LOW (ref 12.0–15.0)
MCH: 30.8 pg (ref 26.0–34.0)
MCHC: 32.5 g/dL (ref 30.0–36.0)
MCV: 95 fL (ref 78.0–100.0)
Platelets: 188 K/uL (ref 150–400)
RBC: 3.21 MIL/uL — ABNORMAL LOW (ref 3.87–5.11)
RDW: 20.2 % — ABNORMAL HIGH (ref 11.5–15.5)
WBC: 11.8 K/uL — ABNORMAL HIGH (ref 4.0–10.5)

## 2014-03-16 LAB — GLUCOSE, CAPILLARY
GLUCOSE-CAPILLARY: 112 mg/dL — AB (ref 70–99)
GLUCOSE-CAPILLARY: 83 mg/dL (ref 70–99)
GLUCOSE-CAPILLARY: 60 mg/dL — AB (ref 70–99)
Glucose-Capillary: 107 mg/dL — ABNORMAL HIGH (ref 70–99)
Glucose-Capillary: 71 mg/dL (ref 70–99)
Glucose-Capillary: 71 mg/dL (ref 70–99)
Glucose-Capillary: 72 mg/dL (ref 70–99)

## 2014-03-16 LAB — BASIC METABOLIC PANEL
BUN: 55 mg/dL — ABNORMAL HIGH (ref 6–23)
CALCIUM: 8.5 mg/dL (ref 8.4–10.5)
CHLORIDE: 109 meq/L (ref 96–112)
CO2: 18 mEq/L — ABNORMAL LOW (ref 19–32)
Creatinine, Ser: 1.73 mg/dL — ABNORMAL HIGH (ref 0.50–1.10)
GFR calc Af Amer: 29 mL/min — ABNORMAL LOW (ref >90)
GFR calc non Af Amer: 25 mL/min — ABNORMAL LOW (ref >90)
Glucose, Bld: 137 mg/dL — ABNORMAL HIGH (ref 70–99)
POTASSIUM: 4.9 meq/L (ref 3.7–5.3)
SODIUM: 140 meq/L (ref 137–147)

## 2014-03-16 LAB — MAGNESIUM: Magnesium: 1.8 mg/dL (ref 1.5–2.5)

## 2014-03-16 NOTE — Progress Notes (Signed)
[facility] PROGRESS NOTE  Sophia Oliver CBJ:[id number] DOB: Sep 30, 1924 DOA: 03/15/2014 PCP: [name], MD  Assessment/Plan: 80 y.o. female, Sophia Oliver is a 80 y.o. female, history of sinus node dysfunction status post pacemaker placement, atrial fibrillation on amiodarone along with Pradaxa all of which [place] EP, breast cancer, DJD, chronic kidney disease stage III baseline creatinine between 1.2-1.5, anxiety, chronic systolic heart failure EF 30%, who lives at home and has been feeling weak for the last 3-4 weeks, also having persistent falls found to have UTI  1. Persistent falls with leukocytosis and UTI; neuro exam no focal;  -cont IV atx, f/u cultures  2. History of tachybradycardia syndrome, underlying paroxysmal atrial fibrillation s/p PPM; episode of brady -Pt was seen evaluated by Dr. Cleda Mccreedy; defer to cardiology   3. Diabetes mellitus type 2. Recent A1c was 8.7 Cont home regimen, ISS:  4. Lytic lesions noted on CT scan head last admission and again this admission, SPEP is abnormal patient follow up with oncology for multiple myeloma workup as outpatient  5. Leukocytosis. Secondary to UTI plus possible underlying multiple myeloma. Does not have diarrhea, no cough, however will rule out C. difficile due to recent antibiotic exposure, treat UTI with Rocephin. Does have ear lobe infection for which she is taking bacitracin ointment which will be continued, she has had doxycycline for more than 7 days we will stop.  6. Chronic systolic CHF with EF 16%. Currently mildly dehydrated despite some fluid congestion on chest x-ray, gentle hydration for 12 hours then stop. Monitor blood pressure and orthostatics.    Code Status: full Family Communication:  D/w patient, husband at the bedside (indicate person spoken with, relationship, and if by phone, the number) Disposition Plan: pend PT   Consultants:  EP  Procedures:  none  Antibiotics:  Ceftriaxone  (indicate start date, and  stop date if known)  HPI/Subjective: alert  Objective: Filed Vitals:   03/16/14 0517  BP: 122/71  Pulse: 85  Temp: 97.9 F (36.6 C)  Resp: 18    Intake/Output Summary (Last 24 hours) at 03/16/14 0928 Last data filed at 03/16/14 0600  Gross per 24 hour  Intake  397.5 ml  Output      0 ml  Net  397.5 ml   Filed Weights   03/15/14 2140 03/16/14 0517  Weight: 50.758 kg (111 lb 14.4 oz) 50.7 kg (111 lb 12.4 oz)    Exam:   General:  alert  Cardiovascular: s1,s2 rrr  Respiratory: CTA BL  Abdomen: s1,2 rr  Musculoskeletal: no LE edema   Data Reviewed: Basic Metabolic Panel:  Recent Labs Lab 03/10/14 0548 03/11/14 0604 03/12/14 0600 03/15/14 1228 03/16/14 0400  NA 138 139 141 137 140  K 4.5 4.1 4.4 4.9 4.9  CL 105 106 110 106 109  CO2 18* 18* 16* 17* 18*  GLUCOSE 236* 62* 67* 197* 137*  BUN 73* 66* 63* 59* 55*  CREATININE 1.86* 1.71* 1.78* 1.74* 1.73*  CALCIUM 8.2* 8.5 8.3* 8.6 8.5  MG  --   --   --   --  1.8   Liver Function Tests:  Recent Labs Lab 03/10/14 1310 03/11/14 0604 03/15/14 1228  AST  --  22 20  ALT  --  23 30  ALKPHOS  --  106 103  BILITOT  --  0.5 0.7  PROT 5.4* 5.5* 5.4*  ALBUMIN  --  2.9* 2.8*   No results found for this basename: LIPASE, AMYLASE,  in the last 168  hours No results found for this basename: AMMONIA,  in the last 168 hours CBC:  Recent Labs Lab 03/09/14 1120 03/10/14 0548 03/12/14 0600 03/15/14 1228 03/16/14 0400  WBC 13.2* 10.3 10.0 20.8* 11.8*  NEUTROABS 12.0*  --  8.5* 19.9*  --   HGB 11.6* 9.8* 10.7* 11.0* 9.9*  HCT 35.2* 30.4* 33.5* 33.3* 30.5*  MCV 94.6 95.0 98.2 94.9 95.0  PLT 241 219 237 204 188   Cardiac Enzymes:  Recent Labs Lab 03/09/14 1414 03/15/14 1228  TROPONINI <0.30 <0.30   BNP (last 3 results)  Recent Labs  12/06/13 1129  PROBNP 200.0*   CBG:  Recent Labs Lab 03/12/14 0746 03/12/14 0839 03/15/14 1902 03/15/14 2147 03/16/14 0740  GLUCAP 64* 89 142* 118* 107*     Recent Results (from the past 240 hour(s))  URINE CULTURE     Status: None   Collection Time    03/09/14 11:48 AM      Result Value Ref Range Status   Specimen Description URINE, CATHETERIZED   Final   Special Requests NONE   Final   Culture  Setup Time     Final   Value: 03/09/2014 17:47     Performed at [place]     Final   Value: NO GROWTH     Performed at [facility]   Culture     Final   Value: NO GROWTH     Performed at [facility]   Report Status 03/10/2014 FINAL   Final  URINE CULTURE     Status: None   Collection Time    03/10/14  3:10 PM      Result Value Ref Range Status   Specimen Description URINE, RANDOM   Final   Special Requests NONE   Final   Culture  Setup Time     Final   Value: 03/10/2014 18:40     Performed at [place]     Final   Value: >=100,000 COLONIES/ML     Performed at [facility]   Culture     Final   Value: Multiple bacterial morphotypes present, none predominant. Suggest appropriate recollection if clinically indicated.     Performed at [facility]   Report Status 03/11/2014 FINAL   Final  CULTURE, BLOOD (ROUTINE X 2)     Status: None   Collection Time    03/15/14  4:50 PM      Result Value Ref Range Status   Specimen Description BLOOD ARM RIGHT   Final   Special Requests BOTTLES DRAWN AEROBIC AND ANAEROBIC 10CC   Final   Culture  Setup Time     Final   Value: 03/15/2014 21:42     Performed at [facility]   Culture     Final   Value:        BLOOD CULTURE RECEIVED NO GROWTH TO DATE CULTURE WILL BE HELD FOR 5 DAYS BEFORE ISSUING A FINAL NEGATIVE REPORT     Performed at [facility]   Report Status PENDING   Incomplete  CULTURE, BLOOD (ROUTINE X 2)     Status: None   Collection Time    03/15/14  5:00 PM      Result Value Ref Range Status   Specimen Description BLOOD ARM RIGHT   Final   Special Requests BOTTLES DRAWN AEROBIC  ONLY 10CC   Final   Culture  Setup Time  Final   Value: 03/15/2014 21:43     Performed at [facility]   Culture     Final   Value:        BLOOD CULTURE RECEIVED NO GROWTH TO DATE CULTURE WILL BE HELD FOR 5 DAYS BEFORE ISSUING A FINAL NEGATIVE REPORT     Performed at [facility]   Report Status PENDING   Incomplete     Studies: Dg Chest 2 View  03/15/2014   CLINICAL DATA:  Weakness  EXAM: CHEST  2 VIEW  COMPARISON:  Prior radiograph from 11/19/2012  FINDINGS: Right-sided pacemaker/AICD with electrodes overlying the right ventricle and right atrium are unchanged. Cardiomegaly is grossly stable.  There is diffuse pulmonary vascular congestion with indistinctness of the interstitial markings, suggesting mild pulmonary edema. A few scattered Kerley B-lines are noted. Bibasilar atelectasis is present as well. Small bilateral pleural effusions noted. No definite focal infiltrate. There is no pneumothorax.  Right shoulder arthroplasty partially visualized. Severe degenerative changes noted at the left shoulder. Diffuse osteopenia present. No acute osseus abnormality.  IMPRESSION: 1. Cardiomegaly with findings suggestive of mild diffuse pulmonary edema. 2. Small bilateral pleural effusions. 3. Mild bibasilar atelectasis.   Electronically Signed   By: Jeannine Boga M.D.   On: 03/15/2014 14:07   Dg Pelvis 1-2 Views  03/15/2014   CLINICAL DATA:  Weakness over the last 2-3 weeks. Multiple falls. History of breast cancer.  EXAM: PELVIS - 1-2 VIEW  COMPARISON:  None.  FINDINGS: Diffuse osteopenia is present. Left total hip arthroplasty. Atherosclerosis. Partially visualized lumbar spondylosis. The obturator rings appear intact. No fracture is identified. Visualized sacral arcades appear within normal limits.  Old left inferior pubic ramus fracture.  IMPRESSION: Osteopenia.  No acute osseous abnormality.   Electronically Signed   By: Dereck Ligas M.D.   On: 03/15/2014 14:08   Ct Head  Wo Contrast  03/15/2014   CLINICAL DATA:  Generalized weakness  EXAM: CT HEAD WITHOUT CONTRAST  CT CERVICAL SPINE WITHOUT CONTRAST  TECHNIQUE: Multidetector CT imaging of the head and cervical spine was performed following the standard protocol without intravenous contrast. Multiplanar CT image reconstructions of the cervical spine were also generated.  COMPARISON:  CT HEAD W/O CM dated 03/09/2014; CT of the head and cervical spine without contrast dated 11/19/2012  FINDINGS: CT HEAD FINDINGS  There is moderate diffuse cerebral and cerebellar atrophy with compensatory ventriculomegaly. This is not out of proportion to the patient's age. There is decreased density in the deep white matter of both cerebral hemispheres consistent with chronic small vessel ischemic type change. There is no evidence of an acute ischemic infarction. The cerebellum and brainstem are normal in density.  At bone window settings there is soft tissue density material within the right sphenoid sinus which is stable. This may reflect chronic inflammation. There are no sinus air-fluid levels. The mastoid air cells are well pneumatized. There is no evidence of an acute skull fracture. There remain rounded lucencies within the frontal and parietal bones on the right.  CT CERVICAL SPINE FINDINGS  There is stable loss of the normal cervical lordosis. There is stable anterolisthesis of C3 with respect to C4 amounting to approximately 4 mm. There is stable disc space narrowing at C3-4, C4-5, C5-6, and C6-7. The prevertebral soft tissue spaces appear normal. There is no perched facet. There is fusion across the facets at the C4-5 level on the right. The odontoid is intact and the lateral masses of C1 align normally with  those of C2. The bony ring at the cervical level is intact. The observed portions of the first and second ribs appear normal. The pulmonary apices are clear.  IMPRESSION: 1. There is no evidence of an acute ischemic or hemorrhagic  infarction. There are age related atrophic changes. There is no intracranial mass effect. 2. There stable lucencies within the right frontal and right parietal bones consistent with the clinically suspected multiple myeloma. 3. There is no evidence of an acute cervical spine fracture nor dislocation. No lytic or blastic vertebral body or posterior element lesion is demonstrated. There are degenerative disc and facet joint changes at multiple cervical levels. Stable grade 1 anterolisthesis of C3 with respect to C4 is present.   Electronically Signed   By: David  Martinique   On: 03/15/2014 14:50   Ct Cervical Spine Wo Contrast  03/15/2014   CLINICAL DATA:  Generalized weakness  EXAM: CT HEAD WITHOUT CONTRAST  CT CERVICAL SPINE WITHOUT CONTRAST  TECHNIQUE: Multidetector CT imaging of the head and cervical spine was performed following the standard protocol without intravenous contrast. Multiplanar CT image reconstructions of the cervical spine were also generated.  COMPARISON:  CT HEAD W/O CM dated 03/09/2014; CT of the head and cervical spine without contrast dated 11/19/2012  FINDINGS: CT HEAD FINDINGS  There is moderate diffuse cerebral and cerebellar atrophy with compensatory ventriculomegaly. This is not out of proportion to the patient's age. There is decreased density in the deep white matter of both cerebral hemispheres consistent with chronic small vessel ischemic type change. There is no evidence of an acute ischemic infarction. The cerebellum and brainstem are normal in density.  At bone window settings there is soft tissue density material within the right sphenoid sinus which is stable. This may reflect chronic inflammation. There are no sinus air-fluid levels. The mastoid air cells are well pneumatized. There is no evidence of an acute skull fracture. There remain rounded lucencies within the frontal and parietal bones on the right.  CT CERVICAL SPINE FINDINGS  There is stable loss of the normal cervical  lordosis. There is stable anterolisthesis of C3 with respect to C4 amounting to approximately 4 mm. There is stable disc space narrowing at C3-4, C4-5, C5-6, and C6-7. The prevertebral soft tissue spaces appear normal. There is no perched facet. There is fusion across the facets at the C4-5 level on the right. The odontoid is intact and the lateral masses of C1 align normally with those of C2. The bony ring at the cervical level is intact. The observed portions of the first and second ribs appear normal. The pulmonary apices are clear.  IMPRESSION: 1. There is no evidence of an acute ischemic or hemorrhagic infarction. There are age related atrophic changes. There is no intracranial mass effect. 2. There stable lucencies within the right frontal and right parietal bones consistent with the clinically suspected multiple myeloma. 3. There is no evidence of an acute cervical spine fracture nor dislocation. No lytic or blastic vertebral body or posterior element lesion is demonstrated. There are degenerative disc and facet joint changes at multiple cervical levels. Stable grade 1 anterolisthesis of C3 with respect to C4 is present.   Electronically Signed   By: David  [place]   On: 03/15/2014 14:50   Dg Foot Complete Left  03/15/2014   CLINICAL DATA:  Left foot pain. Lateral foot pain this following a fall.  EXAM: LEFT FOOT - COMPLETE 3+ VIEW  COMPARISON:  None.  FINDINGS: The alignment of the  left foot is anatomic. Moderate first MTP joint osteoarthritis. Osteopenia. No displaced fracture is present. Fifth metatarsal appears within normal limits. Diabetic type small vessel atherosclerotic calcification. Second toe PIP joint severe degenerative changes are present, which may represent posttraumatic osteoarthritis or erosive osteoarthritis.  IMPRESSION: No acute osseous abnormality.  Osteopenia.   Electronically Signed   By: Dereck Ligas M.D.   On: 03/15/2014 14:12    Scheduled Meds: . ALPRAZolam  0.25 mg Oral  BID  . amiodarone  200 mg Oral BID  . B-complex with vitamin C  1 tablet Oral Daily  . bacitracin-polymyxin b  1 application Topical BID  . cefTRIAXone (ROCEPHIN)  IV  1 g Intravenous Q24H  . dabigatran  75 mg Oral BID  . glipiZIDE  5 mg Oral QAC breakfast  . insulin aspart  0-9 Units Subcutaneous TID WC  . metoprolol  50 mg Oral BID  . nitrofurantoin (macrocrystal-monohydrate)  100 mg Oral QHS  . pantoprazole  40 mg Oral Daily  . sodium chloride  3 mL Intravenous Q12H   Continuous Infusions:   Active Problems:   CKD (chronic kidney disease), stage III   UTI (lower urinary tract infection)   Pacemaker   Tachy-brady syndrome   PAF (paroxysmal atrial fibrillation)   General weakness   Type II or unspecified type diabetes mellitus without mention of complication, not stated as uncontrolled   Syncope    Time spent: >35 minutes     Kinnie Feil  [facility] Pager [phone]. If 7PM-7AM, please contact night-coverage at [email or web address], password [facility] 03/16/2014, 9:28 AM  LOS: 1 day

## 2014-03-16 NOTE — Evaluation (Signed)
Physical Therapy Evaluation Patient Details Name: Sophia Oliver: [id number] DOB: April 23, 1924 Today's Date: 03/16/2014   History of Present Illness  Sophia Oliver  is a 80 y.o. female, Sophia Oliver is a 80 y.o. female, history of sinus node dysfunction status post pacemaker placement, atrial fibrillation on amiodarone along with Pradaxa all of which [name], breast cancer, DJD, chronic kidney disease stage III baseline creatinine between 1.2-1.5, anxiety, chronic systolic heart failure EF 30%, who lives at home and has been feeling weak for the last 3-4 weeks, also having persistent falls, comes to the hospital after she sustained another fall at home where her knees gave out, she was recently hospitalized for a similar complaint at that time she was found to be dehydrated with questionable UTI, after IV fluids and supportive care she improved. She was at that time seen and cleared for discharge by cardiology as well. The ER workup her showed leukocytosis, she is supposed to see oncologist in the outpatient setting for 2 questionable lytic lesions on her skull found last admission, SPEP is borderline was elevated beta-2 microglobulin levels ordered last admission  Clinical Impression  Pt presents with generalized weakness, especially affecting her ability to perform transfers and ambulate. PT will follow acutely to address problem list below and maximize independence and safety. Recommend SNF for rehab, pt hesitantly agreeable.     Follow Up Recommendations Supervision for mobility/OOB;SNF    Equipment Recommendations  None recommended by PT    Recommendations for Other Services OT consult     Precautions / Restrictions Precautions Precautions: Fall Precaution Comments: incontinent of urine Restrictions Weight Bearing Restrictions: No      Mobility  Bed Mobility Overal bed mobility: Needs Assistance Bed Mobility: Rolling;Sidelying to Sit;Sit to Supine Rolling: Supervision Sidelying to  sit: Min assist   Sit to supine: Min assist   General bed mobility comments: vc's for rolling before sliding legs off bed. Min A needed to help pt bring trunk off bed as well as scoot hips to EOB. Pt cannot lift legs independently to get them back into bed, min A required  Transfers Overall transfer level: Needs assistance Equipment used: Rolling walker (2 wheeled);1 person hand held assist Transfers: Sit to/from [facility] Sit to Stand: Mod assist;Min assist Stand pivot transfers: Min assist       General transfer comment: performed transfers multiple times from bed and BSC. Perofrmed with and without RW. Needed min A at first but after 3x, required mod A as legs fatigued (last 2 times)  Ambulation/Gait             General Gait Details: pt fatigued after transfers for toileting and clean up, not enough energy to ambulate  Stairs            Wheelchair Mobility    Modified Rankin (Stroke Patients Only)       Balance Overall balance assessment: Needs assistance Sitting-balance support: No upper extremity supported;Feet supported Sitting balance-Leahy Scale: Fair     Standing balance support: Bilateral upper extremity supported;During functional activity Standing balance-Leahy Scale: Poor Standing balance comment: cannot stand without UE support                             Pertinent Vitals/Pain VSS     Home Living Family/patient expects to be discharged to:: Skilled nursing facility Living Arrangements: Spouse/significant other Available Help at Discharge: Available 24 hours/day;Family Type of Home: House Home Access: Stairs  to enter Entrance Stairs-Rails: None Entrance Stairs-Number of Steps: 1 Home Layout: Two level;1/2 bath on main level;Able to live on main level with bedroom/bathroom Home Equipment: Gilford Rile - 2 wheels;Bedside commode;Shower seat;Cane - single point      Prior Function Level of Independence: Needs  assistance   Gait / Transfers Assistance Needed: has been ambulating with AD but has increased falls, needs assistance  ADL's / Homemaking Assistance Needed: husband has had to help her get legs in bed and he does the cooking and shopping        Hand Dominance        Extremity/Trunk Assessment   Upper Extremity Assessment: Defer to OT evaluation;Generalized weakness           Lower Extremity Assessment: Generalized weakness;RLE deficits/detail;LLE deficits/detail RLE Deficits / Details: bilateral hip flex 2/5, knee flex/ ext 3/5 LLE Deficits / Details: same as LLE  Cervical / Trunk Assessment: Kyphotic  Communication   Communication: No difficulties  Cognition Arousal/Alertness: Awake/alert Behavior During Therapy: Anxious Overall Cognitive Status: Within Functional Limits for tasks assessed                      General Comments      Exercises General Exercises - Lower Extremity Ankle Circles/Pumps: AROM;Both;10 reps;Supine Quad Sets: AROM;Both;10 reps;Supine Short Arc Quad: AROM;Both;10 reps;Supine Heel Slides: AROM;Both;5 reps;Supine      Assessment/Plan    PT Assessment Patient needs continued PT services  PT Diagnosis Difficulty walking;Generalized weakness   PT Problem List Decreased strength;Decreased range of motion;Decreased activity tolerance;Decreased balance;Decreased mobility;Decreased knowledge of use of DME  PT Treatment Interventions DME instruction;Gait training;Functional mobility training;Therapeutic activities;Therapeutic exercise;Balance training;Neuromuscular re-education;Patient/family education   PT Goals (Current goals can be found in the Care Plan section) Acute Rehab PT Goals Patient Stated Goal: return home PT Goal Formulation: With patient Time For Goal Achievement: 03/30/14 Potential to Achieve Goals: Fair    Frequency Min 3X/week   Barriers to discharge Decreased caregiver support husband unable to help her  physically    Co-evaluation               End of Session Equipment Utilized During Treatment: Gait belt Activity Tolerance: Patient limited by fatigue Patient left: in bed;with call bell/phone within reach;with bed alarm set;with family/visitor present Nurse Communication: Mobility status         Time: 3007-6226 PT Time Calculation (min): 31 min   Charges:   PT Evaluation $Initial PT Evaluation Tier I: 1 Procedure PT Treatments $Therapeutic Activity: 23-37 mins   PT G Codes:        Leighton Roach, PT  Acute Rehab Services  [place] 03/16/2014, 4:32 PM

## 2014-03-16 NOTE — Consult Note (Signed)
ELECTROPHYSIOLOGY CONSULT NOTE  Patient ID: Sophia Oliver, MRN: [id number], DOB/AGE: 08-Nov-1923 80 y.o. Admit date: 03/15/2014 Date of Consult: 03/16/2014  Primary Physician: [name], MD Primary Cardiologist: *[place]  Chief Complaint: weakness   HPI Sophia Oliver is a 80 y.o. female  Asked to see because of diffuse weakness and a previously implanted pacemaker.  Evaluation for similar symptoms at the last hospitalization that demonstrated leukocytosis and apparently possible lytic lesions in her skull. SPEP was borderline.   Echocardiogram /15 demonstrated ejection fraction of 30-35% with diffuse wall motion abnormalities. There was mild-moderate MR and moderate-severe LAE (45/2.96) right-sided function was relatively normal. Ejection fraction 1/14 he demonstrated 40-45%  She had been admitted in the fall and placed on amiodarone for one thought to be atrial fibrillation but also noted to have atrial tachycardia  She had been admitted last week again for weakness attributed subtotally to UTIs. She was seen in consultation by Dr. Greggory Brandy at that time noted atrial fibrillation as the underlying rhythm. There was no comment as to ventricular rate carotid pulses were all in the 73-93 range  She was admitted again because of weakness and falls. This is been a progressive problem over recent months. It has been accompanied by anorexia.  Past Medical History  Diagnosis Date  . Syncope   . Sinus node dysfunction     a. s/p pacemaker.  . Wide-complex tachycardia   . Cholelithiasis   . Glucose intolerance (impaired glucose tolerance)   . Anxiety   . PONV (postoperative nausea and vomiting)   . Chronic kidney disease     a. Baseline Cr reported 1.8-2.0.  . Injury of right upper arm     after venipuncture in 3/13 - pt describes pain afterwards- no followup done seen by PCP- no further treatment   . PAF (paroxysmal atrial fibrillation)   . Left bundle branch block   . Tachycardia-bradycardia  syndrome   . CHF (congestive heart failure)     a. EF 30-35% by echo 2015.  [name] Pacemaker     Guidant, implanted 2006  . Type II diabetes mellitus     "recently dx'd" (03/09/2014)  . Breast cancer 2003    "radiation on right; mastectomy on the left"  . Hypertension   . GERD (gastroesophageal reflux disease)   . DJD (degenerative joint disease)   . Arthritis     "real bad in my lower back" (03/09/2014)  . Chronic lower back pain   . Orthostatic hypotension     a. Diuretics d/c'd 03/2014.  [name] PAT (paroxysmal atrial tachycardia)       Surgical History:  Past Surgical History  Procedure Laterality Date  . Replacement total knee bilateral Bilateral   . Total shoulder replacement    . Tonsillectomy    . Orif distal femur fracture Right 12/2007  . Tonsillectomy    . Mastectomy Left 2003  . Total hip arthroplasty  06/16/2012    Procedure: TOTAL HIP ARTHROPLASTY;  Surgeon: Mauri Pole, MD;  Location: [facility];  Service: Orthopedics;  Laterality: Left;  . Hip closed reduction  06/30/2012    Procedure: CLOSED MANIPULATION HIP;  Surgeon: Mauri Pole, MD;  Location: [facility];  Service: Orthopedics;  Laterality: Left;  . Abdominal hysterectomy    . Pacemaker insertion      Guidant Insignia I  . Cataract extraction w/ intraocular lens  implant, bilateral Bilateral 2000's  . Refractive surgery Bilateral 01/2014  . Inguinal hernia repair Left     "  had emergency OR for it a few years ago" (03/09/2014)  . Joint replacement    . Breast biopsy Bilateral      Home Meds: Prior to Admission medications   Medication Sig Start Date End Date Taking? Authorizing Provider  acetaminophen (TYLENOL) 325 MG tablet Take 325 mg by mouth every 6 (six) hours as needed for pain.   Yes Historical Provider, MD  ALPRAZolam (XANAX) 0.25 MG tablet Take 1 tablet (0.25 mg total) by mouth 2 (two) times daily. 11/25/12  Yes Geradine Girt, DO  amiodarone (PACERONE) 200 MG tablet Take 200 mg by mouth 2 (two) times daily.   Yes  Historical Provider, MD  B Complex-C (B-COMPLEX WITH VITAMIN C) tablet Take 1 tablet by mouth daily.   Yes Historical Provider, MD  bacitracin-polymyxin b (POLYSPORIN) ointment Apply 1 application topically 2 (two) times daily. Apply twice daily to ear lobes.   Yes Historical Provider, MD  dabigatran (PRADAXA) 75 MG CAPS Take 75 mg by mouth 2 (two) times daily.  05/29/11  Yes Deboraha Sprang, MD  diclofenac sodium (VOLTAREN) 1 % GEL Apply 2 g topically 4 (four) times daily. 11/25/12  Yes Geradine Girt, DO  glipiZIDE (GLUCOTROL) 5 MG tablet Take 1 tablet (5 mg total) by mouth daily before breakfast. 01/23/14  Yes Charlynne Cousins, MD  metoprolol (LOPRESSOR) 50 MG tablet Take 1 tablet (50 mg total) by mouth 2 (two) times daily. 12/01/13  Yes Deboraha Sprang, MD  nitrofurantoin, macrocrystal-monohydrate, (MACROBID) 100 MG capsule Take 1 capsule (100 mg total) by mouth at bedtime. 03/12/14  Yes Ripudeep Krystal Eaton, MD  pantoprazole (PROTONIX) 40 MG tablet Take 40 mg by mouth daily.   Yes Historical Provider, MD    Inpatient Medications:  . ALPRAZolam  0.25 mg Oral BID  . amiodarone  200 mg Oral BID  . B-complex with vitamin C  1 tablet Oral Daily  . bacitracin-polymyxin b  1 application Topical BID  . cefTRIAXone (ROCEPHIN)  IV  1 g Intravenous Q24H  . dabigatran  75 mg Oral BID  . glipiZIDE  5 mg Oral QAC breakfast  . insulin aspart  0-9 Units Subcutaneous TID WC  . metoprolol  50 mg Oral BID  . nitrofurantoin (macrocrystal-monohydrate)  100 mg Oral QHS  . pantoprazole  40 mg Oral Daily  . sodium chloride  3 mL Intravenous Q12H     Allergies:  Allergies  Allergen Reactions  . Codeine Nausea And Vomiting  . Other Nausea And Vomiting    Narcotics  dizziness    History   Social History  . Marital Status: Married    Spouse Name: N/A    Number of Children: N/A  . Years of Education: N/A   Occupational History  . Not on file.   Social History Main Topics  . Smoking status: Former Smoker  -- 0.50 packs/day for 6 years    Types: Cigarettes  . Smokeless tobacco: Never Used  . Alcohol Use: Yes     Comment: "I'll have a social drink maybe at [employer]  . Drug Use: No  . Sexual Activity: No   Other Topics Concern  . Not on file   Social History Narrative   Married, retired.      Family History  Problem Relation Age of Onset  . Hypertension       ROS:  Please see the history of present illness.     All other systems reviewed and negative.    Physical  Exam:*   Blood pressure 122/71, pulse 85, temperature 97.9 F (36.6 C), temperature source Oral, resp. rate 18, height _0  (1.549 m), weight 111 lb 12.4 oz (50.7 kg), SpO2 96.00%. General: Awake and somewhat emaciated disheveled female in no acute distress. Head: Normocephalic, atraumatic, sclera non-icteric, no xanthomas, nares are without discharge. EENT: normal Lymph Nodes:  none Back: without scoliosis/kyphosis , no CVA tendersness Neck: Negative for carotid bruits. JVD >10 Lungs: Clear bilaterally to auscultation without wheezes, rales, or rhonchi. Breathing is unlabored. Heart: Irregularly irregular rhythm with a 2/6 systolic murmur , rubs, or gallops appreciated. Abdomen: Soft, non-tender, non-distended with normoactive bowel sounds. No hepatomegaly. No rebound/guarding. No obvious abdominal masses. Msk:  Strength and tone appear normal for age. Extremities: No clubbing or cyanosis.  1+  edema.  Distal pedal pulses are 2+ and equal bilaterally. Skin: Warm and Dry Neuro: Alert and oriented X 3. CN III-XII intact Grossly normal sensory and motor function . Psych:  Responds to questions appropriately with a normal affect.      Labs: Cardiac Enzymes  Recent Labs  03/15/14 1228  TROPONINI <0.30   CBC Lab Results  Component Value Date   WBC 11.8* 03/16/2014   HGB 9.9* 03/16/2014   HCT 30.5* 03/16/2014   MCV 95.0 03/16/2014   PLT 188 03/16/2014   PROTIME: No results found for this basename: LABPROT,  INR,  in the last 72 hours Chemistry  Recent Labs Lab 03/15/14 1228 03/16/14 0400  NA 137 140  K 4.9 4.9  CL 106 109  CO2 17* 18*  BUN 59* 55*  CREATININE 1.74* 1.73*  CALCIUM 8.6 8.5  PROT 5.4*  --   BILITOT 0.7  --   ALKPHOS 103  --   ALT 30  --   AST 20  --   GLUCOSE 197* 137*   Lipids Lab Results  Component Value Date   CHOL  Value: 129        ATP III CLASSIFICATION:  <200     mg/dL   Desirable  200-239  mg/dL   Borderline High  >=240    mg/dL   High        12/20/2009   HDL 69 12/20/2009   LDLCALC  Value: 51        Total Cholesterol/HDL:CHD Risk Coronary Heart Disease Risk Table                     Men   Women  1/2 Average Risk   3.4   3.3  Average Risk       5.0   4.4  2 X Average Risk   9.6   7.1  3 X Average Risk  23.4   11.0        Use the calculated Patient Ratio above and the CHD Risk Table to determine the patient's CHD Risk.        ATP III CLASSIFICATION (LDL):  <100     mg/dL   Optimal  100-129  mg/dL   Near or Above                    Optimal  130-159  mg/dL   Borderline  160-189  mg/dL   High  >190     mg/dL   Very High 12/20/2009   TRIG 43 12/20/2009   BNP Pro B Natriuretic peptide (BNP)  Date/Time Value Ref Range Status  12/06/2013 11:29 AM 200.0* 0.0 - 100.0 pg/mL Final  11/19/2012  12:35 PM 910.2* 0 - 450 pg/mL Final  12/23/2009  4:50 AM 241.0* 0.0 - 100.0 pg/mL Final  12/22/2009  4:20 AM 229.0* 0.0 - 100.0 pg/mL Final   Miscellaneous Lab Results  Component Value Date   DDIMER  Value: 8.16        AT THE INHOUSE ESTABLISHED CUTOFF VALUE OF 0.48 ug/mL FEU, THIS ASSAY HAS BEEN DOCUMENTED IN THE LITERATURE TO HAVE* 12/22/2007    Radiology/Studies:  Dg Chest 2 View  03/15/2014   CLINICAL DATA:  Weakness  EXAM: CHEST  2 VIEW  COMPARISON:  Prior radiograph from 11/19/2012  FINDINGS: Right-sided pacemaker/AICD with electrodes overlying the right ventricle and right atrium are unchanged. Cardiomegaly is grossly stable.  There is diffuse pulmonary vascular congestion with  indistinctness of the interstitial markings, suggesting mild pulmonary edema. A few scattered Kerley B-lines are noted. Bibasilar atelectasis is present as well. Small bilateral pleural effusions noted. No definite focal infiltrate. There is no pneumothorax.  Right shoulder arthroplasty partially visualized. Severe degenerative changes noted at the left shoulder. Diffuse osteopenia present. No acute osseus abnormality.  IMPRESSION: 1. Cardiomegaly with findings suggestive of mild diffuse pulmonary edema. 2. Small bilateral pleural effusions. 3. Mild bibasilar atelectasis.   Electronically Signed   By: Jeannine Boga M.D.   On: 03/15/2014 14:07   Dg Chest 2 View  03/09/2014   CLINICAL DATA:  Status post fall.  EXAM: CHEST  2 VIEW  COMPARISON:  Single view of the chest of 11/19/2012.  FINDINGS: There is cardiomegaly and vascular congestion without frank edema. No consolidative process, pneumothorax or effusion is identified. Skin fold projecting over the right chest is noted. No acute bony abnormality is seen. Remote left rib fractures are noted and unchanged. The patient is status post right shoulder replacement with advanced degenerative change about the left shoulder.  IMPRESSION: Cardiomegaly without acute disease.   Electronically Signed   By: Inge Rise M.D.   On: 03/09/2014 11:37   Dg Pelvis 1-2 Views  03/15/2014   CLINICAL DATA:  Weakness over the last 2-3 weeks. Multiple falls. History of breast cancer.  EXAM: PELVIS - 1-2 VIEW  COMPARISON:  None.  FINDINGS: Diffuse osteopenia is present. Left total hip arthroplasty. Atherosclerosis. Partially visualized lumbar spondylosis. The obturator rings appear intact. No fracture is identified. Visualized sacral arcades appear within normal limits.  Old left inferior pubic ramus fracture.  IMPRESSION: Osteopenia.  No acute osseous abnormality.   Electronically Signed   By: Dereck Ligas M.D.   On: 03/15/2014 14:08   Ct Head Wo Contrast  03/15/2014    CLINICAL DATA:  Generalized weakness  EXAM: CT HEAD WITHOUT CONTRAST  CT CERVICAL SPINE WITHOUT CONTRAST  TECHNIQUE: Multidetector CT imaging of the head and cervical spine was performed following the standard protocol without intravenous contrast. Multiplanar CT image reconstructions of the cervical spine were also generated.  COMPARISON:  CT HEAD W/O CM dated 03/09/2014; CT of the head and cervical spine without contrast dated 11/19/2012  FINDINGS: CT HEAD FINDINGS  There is moderate diffuse cerebral and cerebellar atrophy with compensatory ventriculomegaly. This is not out of proportion to the patient's age. There is decreased density in the deep white matter of both cerebral hemispheres consistent with chronic small vessel ischemic type change. There is no evidence of an acute ischemic infarction. The cerebellum and brainstem are normal in density.  At bone window settings there is soft tissue density material within the right sphenoid sinus which is stable. This may reflect chronic  inflammation. There are no sinus air-fluid levels. The mastoid air cells are well pneumatized. There is no evidence of an acute skull fracture. There remain rounded lucencies within the frontal and parietal bones on the right.  CT CERVICAL SPINE FINDINGS  There is stable loss of the normal cervical lordosis. There is stable anterolisthesis of C3 with respect to C4 amounting to approximately 4 mm. There is stable disc space narrowing at C3-4, C4-5, C5-6, and C6-7. The prevertebral soft tissue spaces appear normal. There is no perched facet. There is fusion across the facets at the C4-5 level on the right. The odontoid is intact and the lateral masses of C1 align normally with those of C2. The bony ring at the cervical level is intact. The observed portions of the first and second ribs appear normal. The pulmonary apices are clear.  IMPRESSION: 1. There is no evidence of an acute ischemic or hemorrhagic infarction. There are age related  atrophic changes. There is no intracranial mass effect. 2. There stable lucencies within the right frontal and right parietal bones consistent with the clinically suspected multiple myeloma. 3. There is no evidence of an acute cervical spine fracture nor dislocation. No lytic or blastic vertebral body or posterior element lesion is demonstrated. There are degenerative disc and facet joint changes at multiple cervical levels. Stable grade 1 anterolisthesis of C3 with respect to C4 is present.   Electronically Signed   By: David  [place]   On: 03/15/2014 14:50   Ct Head Wo Contrast  03/09/2014   CLINICAL DATA:  Fall.  EXAM: CT HEAD WITHOUT CONTRAST  TECHNIQUE: Contiguous axial images were obtained from the base of the skull through the vertex without intravenous contrast.  COMPARISON:  CT 11/19/2012  FINDINGS: Moderate atrophy. Moderate chronic microvascular ischemic change in the white matter. This is stable.  Negative for acute infarct.  Negative for hemorrhage or mass.  Rounded lytic lesions in the right frontal bone and right parietal bone are unchanged. These are indeterminate but could be seen with myeloma. Metastatic disease and benign causes are also possible.  IMPRESSION: Atrophy and chronic microvascular ischemia. No acute intracranial abnormality.  Lytic lesions right frontal bone and right parietal bone are stable. Question myeloma   Electronically Signed   By: Franchot Gallo M.D.   On: 03/09/2014 13:04   Ct Cervical Spine Wo Contrast  03/15/2014   CLINICAL DATA:  Generalized weakness  EXAM: CT HEAD WITHOUT CONTRAST  CT CERVICAL SPINE WITHOUT CONTRAST  TECHNIQUE: Multidetector CT imaging of the head and cervical spine was performed following the standard protocol without intravenous contrast. Multiplanar CT image reconstructions of the cervical spine were also generated.  COMPARISON:  CT HEAD W/O CM dated 03/09/2014; CT of the head and cervical spine without contrast dated 11/19/2012  FINDINGS: CT HEAD  FINDINGS  There is moderate diffuse cerebral and cerebellar atrophy with compensatory ventriculomegaly. This is not out of proportion to the patient's age. There is decreased density in the deep white matter of both cerebral hemispheres consistent with chronic small vessel ischemic type change. There is no evidence of an acute ischemic infarction. The cerebellum and brainstem are normal in density.  At bone window settings there is soft tissue density material within the right sphenoid sinus which is stable. This may reflect chronic inflammation. There are no sinus air-fluid levels. The mastoid air cells are well pneumatized. There is no evidence of an acute skull fracture. There remain rounded lucencies within the frontal and parietal bones on  the right.  CT CERVICAL SPINE FINDINGS  There is stable loss of the normal cervical lordosis. There is stable anterolisthesis of C3 with respect to C4 amounting to approximately 4 mm. There is stable disc space narrowing at C3-4, C4-5, C5-6, and C6-7. The prevertebral soft tissue spaces appear normal. There is no perched facet. There is fusion across the facets at the C4-5 level on the right. The odontoid is intact and the lateral masses of C1 align normally with those of C2. The bony ring at the cervical level is intact. The observed portions of the first and second ribs appear normal. The pulmonary apices are clear.  IMPRESSION: 1. There is no evidence of an acute ischemic or hemorrhagic infarction. There are age related atrophic changes. There is no intracranial mass effect. 2. There stable lucencies within the right frontal and right parietal bones consistent with the clinically suspected multiple myeloma. 3. There is no evidence of an acute cervical spine fracture nor dislocation. No lytic or blastic vertebral body or posterior element lesion is demonstrated. There are degenerative disc and facet joint changes at multiple cervical levels. Stable grade 1 anterolisthesis  of C3 with respect to C4 is present.   Electronically Signed   By: David  [place]   On: 03/15/2014 14:50   Dg Bone Survey Met  03/11/2014   CLINICAL DATA:  Pain.  EXAM: METASTATIC BONE SURVEY  COMPARISON:  Bone scan 02/05/2005.  FINDINGS: Standard metastatic bone survey performed. Images of the axial and appendicular skeleton obtained. No evidence of metastatic disease. No focal lytic or sclerotic bony lesions are noted. Postsurgical changes noted over both shoulders and over the chest. Postsurgical changes left hip and both knees. Severe degenerative changes noted throughout the spine.Peripheral vascular disease. Cardiomegaly is noted. Cardiac pacer with lead tips in right atrium right ventricle noted.  IMPRESSION: No evidence of metastatic disease.   Electronically Signed   By: [place]   On: 03/11/2014 07:49   Dg Foot Complete Left  03/15/2014   CLINICAL DATA:  Left foot pain. Lateral foot pain this following a fall.  EXAM: LEFT FOOT - COMPLETE 3+ VIEW  COMPARISON:  None.  FINDINGS: The alignment of the left foot is anatomic. Moderate first MTP joint osteoarthritis. Osteopenia. No displaced fracture is present. Fifth metatarsal appears within normal limits. Diabetic type small vessel atherosclerotic calcification. Second toe PIP joint severe degenerative changes are present, which may represent posttraumatic osteoarthritis or erosive osteoarthritis.  IMPRESSION: No acute osseous abnormality.  Osteopenia.   Electronically Signed   By: Dereck Ligas M.D.   On: 03/15/2014 14:12    EKG:  Wide complex thyhtm with oout discernible pwaves  Device interrogation:: Sinus rhythm with variable rates 40-70s RA output a little below threshod Device RA output increased and rate increased to DDI 65  Assessment and Plan:   Weakness and neuropathy  Paroxysmal atrial fibrillation  Amiodarone therapy  Nonischemic cardiomyopathy with recent deterioration in ejection fraction  Congestive heart  failure-systolic-acute on chronic  Pacemaker-[employer]  I'm not sure how rhythm issues are contributing to her current situation. I wonder whether the amiodarone may not be responsible for some of her neuropathic symptoms and weakness and I think it's exclusion is required it. I will discontinue it.  Assess left ventricular function. There is evidence of volume overload so to undertake diuresis-gently  Please let her primary cardiologist noted she is here.  She was ongoing elevation of her BUN relative to creatinine. Blood count is stable. I  wonder whether this is a manifestation of cardiorenal syndrome. Ejection fraction may help [place] here  Deboraha Sprang

## 2014-03-16 NOTE — Care Management Note (Unsigned)
    Page 1 of 1   03/16/2014     4:59:00 PM CARE MANAGEMENT NOTE 03/16/2014  Patient:  Sophia Oliver, Sophia Oliver   Account Number:  [id number]  Date Initiated:  03/16/2014  Documentation initiated by:  GRAVES-BIGELOW,Ziasia Lenoir  Subjective/Objective Assessment:   Pt admitted for Persistent falls with leukocytosis and UTI - he admitted to a telemetry bed, urine and blood cultures, IV Rocephin empirically monitor.     Action/Plan:   Pt is active with [facility] RN?PT services. May need SNF at d/c.   Anticipated DC Date:  03/19/2014   Anticipated DC Plan:  SKILLED NURSING FACILITY  In-house referral  Clinical Social Worker      DC Planning Services  CM consult      Choice offered to / List presented to:             Status of service:  In process, will continue to follow Medicare Important Message given?  YES (If response is "NO", the following Medicare IM given date fields will be blank) Date Medicare IM given:  03/15/2014 Date Additional Medicare IM given:    Discharge Disposition:    Per UR Regulation:  Reviewed for med. necessity/level of care/duration of stay  If discussed at [place] of Stay Meetings, dates discussed:    Comments:

## 2014-03-17 ENCOUNTER — Inpatient Hospital Stay (HOSPITAL_COMMUNITY): Payer: Medicare Other

## 2014-03-17 DIAGNOSIS — I495 Sick sinus syndrome: Secondary | ICD-10-CM

## 2014-03-17 DIAGNOSIS — I369 Nonrheumatic tricuspid valve disorder, unspecified: Secondary | ICD-10-CM

## 2014-03-17 LAB — URINE CULTURE: Colony Count: >=100000

## 2014-03-17 LAB — GLUCOSE, CAPILLARY
GLUCOSE-CAPILLARY: 26 mg/dL — AB (ref 70–99)
GLUCOSE-CAPILLARY: 288 mg/dL — AB (ref 70–99)
GLUCOSE-CAPILLARY: 277 mg/dL — AB (ref 70–99)
Glucose-Capillary: 88 mg/dL (ref 70–99)
Glucose-Capillary: 52 mg/dL — ABNORMAL LOW (ref 70–99)
Glucose-Capillary: 84 mg/dL (ref 70–99)
Glucose-Capillary: 59 mg/dL — ABNORMAL LOW (ref 70–99)
Glucose-Capillary: 178 mg/dL — ABNORMAL HIGH (ref 70–99)
Glucose-Capillary: 237 mg/dL — ABNORMAL HIGH (ref 70–99)
Glucose-Capillary: 290 mg/dL — ABNORMAL HIGH (ref 70–99)

## 2014-03-17 MED ORDER — DEXTROSE 50 % IV SOLN: 50.0000 mL | Freq: Once | INTRAVENOUS | Status: AC | PRN

## 2014-03-17 MED ORDER — DEXTROSE 50 % IV SOLN
INTRAVENOUS | Status: AC
  Filled 2014-03-17: qty 50

## 2014-03-17 MED ORDER — PIPERACILLIN-TAZOBACTAM IN DEX 2-0.25 GM/50ML IV SOLN
2.2500 g | Freq: Three times a day (TID) | INTRAVENOUS | Status: DC
  Filled 2014-03-17 (×2): qty 50

## 2014-03-17 MED ORDER — DEXTROSE 5 % IV SOLN
INTRAVENOUS | Status: DC
  Administered 2014-03-17: 50 mL via INTRAVENOUS

## 2014-03-17 MED ORDER — FUROSEMIDE 20 MG PO TABS
20.0000 mg | ORAL_TABLET | Freq: Once | ORAL | Status: AC
  Administered 2014-03-17: 20 mg via ORAL
  Filled 2014-03-17: qty 1

## 2014-03-17 MED ORDER — DEXTROSE 10 % IV SOLN
INTRAVENOUS | Status: DC
  Administered 2014-03-17: 13:00:00 via INTRAVENOUS

## 2014-03-17 MED ORDER — DEXTROSE 5 % IV SOLN: INTRAVENOUS | Status: DC

## 2014-03-17 MED ORDER — DEXTROSE 50 % IV SOLN
1.0000 | Freq: Once | INTRAVENOUS | Status: AC
  Administered 2014-03-17: 50 mL via INTRAVENOUS

## 2014-03-17 MED ORDER — SODIUM CHLORIDE 0.9 % IV SOLN
250.0000 mg | Freq: Two times a day (BID) | INTRAVENOUS | Status: DC
  Administered 2014-03-17 – 2014-03-22 (×10): 250 mg via INTRAVENOUS
  Filled 2014-03-17 (×11): qty 250

## 2014-03-17 NOTE — Progress Notes (Signed)
  Echocardiogram 2D Echocardiogram has been performed.  Basilia Jumbo 03/17/2014, 2:24 PM

## 2014-03-17 NOTE — Progress Notes (Signed)
ANTIBIOTIC CONSULT NOTE - INITIAL  Pharmacy Consult for primaxin  Indication: ESBL E.coli  Allergies  Allergen Reactions  . Codeine Nausea And Vomiting  . Other Nausea And Vomiting    Narcotics  dizziness    Patient Measurements: Height: 5\' 1"  (154.9 cm) Weight: 113 lb 1.5 oz (51.3 kg) IBW/kg (Calculated) : 47.8 Adjusted Body Weight:   Vital Signs: Temp: 97.4 F (36.3 C) (05/14 1347) Temp src: Oral (05/14 1347) BP: 105/62 mmHg (05/14 1347) Pulse Rate: 85 (05/14 1347) Intake/Output from previous day: 05/13 0701 - 05/14 0700 In: 600 [P.O.:600] Out: -  Intake/Output from this shift: Total I/O In: 240 [P.O.:240] Out: -   Labs:  Recent Labs  03/15/14 1228 03/16/14 0400  WBC 20.8* 11.8*  HGB 11.0* 9.9*  PLT 204 188  CREATININE 1.74* 1.73*   Estimated Creatinine Clearance: 17.3 ml/min (by C-G formula based on Cr of 1.73). No results found for this basename: VANCOTROUGH, Corlis Leak, VANCORANDOM, GENTTROUGH, GENTPEAK, GENTRANDOM, TOBRATROUGH, TOBRAPEAK, TOBRARND, AMIKACINPEAK, AMIKACINTROU, AMIKACIN,  in the last 72 hours   Microbiology: Recent Results (from the past 720 hour(s))  URINE CULTURE     Status: None   Collection Time    03/09/14 11:48 AM      Result Value Ref Range Status   Specimen Description URINE, CATHETERIZED   Final   Special Requests NONE   Final   Culture  Setup Time     Final   Value: 03/09/2014 17:47     Performed at [facility]     Final   Value: NO GROWTH     Performed at [facility]   Culture     Final   Value: NO GROWTH     Performed at [facility]   Report Status 03/10/2014 FINAL   Final  URINE CULTURE     Status: None   Collection Time    03/10/14  3:10 PM      Result Value Ref Range Status   Specimen Description URINE, RANDOM   Final   Special Requests NONE   Final   Culture  Setup Time     Final   Value: 03/10/2014 18:40     Performed at [facility]      Final   Value: >=100,000 COLONIES/ML     Performed at [facility]   Culture     Final   Value: Multiple bacterial morphotypes present, none predominant. Suggest appropriate recollection if clinically indicated.     Performed at [facility]   Report Status 03/11/2014 FINAL   Final  URINE CULTURE     Status: None   Collection Time    03/15/14  2:58 PM      Result Value Ref Range Status   Specimen Description URINE, CLEAN CATCH   Final   Special Requests NONE   Final   Culture  Setup Time     Final   Value: 03/15/2014 15:43     Performed at [facility]     Final   Value: >=100,000 COLONIES/ML     Performed at [facility]   Culture     Final   Value: ESCHERICHIA COLI     Note: Confirmed Extended Spectrum Beta-Lactamase Producer (ESBL) CRITICAL RESULT CALLED TO, READ BACK BY AND VERIFIED WITH: EMILY M. AT 12:45PM ON 03/17/2014 HAJAM     Performed at [facility]   Report  Status 03/17/2014 FINAL   Final   Organism ID, Bacteria ESCHERICHIA COLI   Final  CULTURE, BLOOD (ROUTINE X 2)     Status: None   Collection Time    03/15/14  4:50 PM      Result Value Ref Range Status   Specimen Description BLOOD ARM RIGHT   Final   Special Requests BOTTLES DRAWN AEROBIC AND ANAEROBIC 10CC   Final   Culture  Setup Time     Final   Value: 03/15/2014 21:42     Performed at [facility]   Culture     Final   Value:        BLOOD CULTURE RECEIVED NO GROWTH TO DATE CULTURE WILL BE HELD FOR 5 DAYS BEFORE ISSUING A FINAL NEGATIVE REPORT     Performed at [facility]   Report Status PENDING   Incomplete  CULTURE, BLOOD (ROUTINE X 2)     Status: None   Collection Time    03/15/14  5:00 PM      Result Value Ref Range Status   Specimen Description BLOOD ARM RIGHT   Final   Special Requests BOTTLES DRAWN AEROBIC ONLY 10CC   Final   Culture  Setup Time     Final   Value: 03/15/2014 21:43     Performed at [facility]    Culture     Final   Value:        BLOOD CULTURE RECEIVED NO GROWTH TO DATE CULTURE WILL BE HELD FOR 5 DAYS BEFORE ISSUING A FINAL NEGATIVE REPORT     Performed at [facility]   Report Status PENDING   Incomplete    Medical History: Past Medical History  Diagnosis Date  . Syncope   . Sinus node dysfunction     a. s/p pacemaker.  . Wide-complex tachycardia   . Cholelithiasis   . Glucose intolerance (impaired glucose tolerance)   . Anxiety   . PONV (postoperative nausea and vomiting)   . Chronic kidney disease     a. Baseline Cr reported 1.8-2.0.  . Injury of right upper arm     after venipuncture in 3/13 - pt describes pain afterwards- no followup done seen by PCP- no further treatment   . PAF (paroxysmal atrial fibrillation)   . Left bundle branch block   . Tachycardia-bradycardia syndrome   . CHF (congestive heart failure)     a. EF 30-35% by echo 2015.  [name] Pacemaker     Guidant, implanted 2006  . Type II diabetes mellitus     "recently dx'd" (03/09/2014)  . Breast cancer 2003    "radiation on right; mastectomy on the left"  . Hypertension   . GERD (gastroesophageal reflux disease)   . DJD (degenerative joint disease)   . Arthritis     "real bad in my lower back" (03/09/2014)  . Chronic lower back pain   . Orthostatic hypotension     a. Diuretics d/c'd 03/2014.  [name] PAT (paroxysmal atrial tachycardia)     Medications:  Scheduled:  . ALPRAZolam  0.25 mg Oral BID  . B-complex with vitamin C  1 tablet Oral Daily  . bacitracin-polymyxin b  1 application Topical BID  . dabigatran  75 mg Oral BID  . metoprolol  50 mg Oral BID  . pantoprazole  40 mg Oral Daily  . sodium chloride  3 mL Intravenous Q12H   Infusions:  . dextrose 50 mL/hr at 03/17/14 1312   Assessment:  80 yo who has now grown out some ESBL ecoli. We are switching to primaxin from zosyn.    Plan:   Primaxin 250mg  IV q12 F/u with LOT

## 2014-03-17 NOTE — Progress Notes (Signed)
Hypoglycemic Event  CBG: 59  Treatment: 15 GM carbohydrate snack  Symptoms: Shaky  Follow-up CBG: Time:1301 CBG Result:26  Possible Reasons for Event: Medication regimen: pt was on glipizede with increased creatinine  Comments/MD notified:Dr. Emmie Niemann  Remember to initiate Hypoglycemia Order Set & complete

## 2014-03-17 NOTE — Progress Notes (Signed)
Patient: Sophia Oliver Date of Encounter: 03/17/2014, 3:23 PM Admit date: 03/15/2014     Subjective  Sophia Oliver reports "feeling shaky" this AM. CBG 52. RN gave 15 gram carb snack >> CBG now 84. She denies CP, SOB or palpitations.   Objective  Physical Exam: Vitals: BP 105/62  Pulse 85  Temp(Src) 97.4 F (36.3 C) (Oral)  Resp 18  Ht _0  (1.549 m)  Wt 113 lb 1.5 oz (51.3 kg)  BMI 21.38 kg/m2  SpO2 94% General: Well developed, elderly 80 year old female in no acute distress. Neck: Supple. +JVD. Lungs: Clear bilaterally to auscultation without wheezes, rales, or rhonchi. Breathing is unlabored. Heart: Irregular S1 S2 without murmurs, rubs, or gallops.  Abdomen: Soft, non-distended. Extremities: No clubbing or cyanosis. 1+ pedal edema bilaterally.   Neuro: Alert and oriented X 3. Moves all extremities spontaneously. No focal deficits.  Intake/Output:  Intake/Output Summary (Last 24 hours) at 03/17/14 1523 Last data filed at 03/17/14 0904  Gross per 24 hour  Intake    240 ml  Output      0 ml  Net    240 ml    Inpatient Medications:  . ALPRAZolam  0.25 mg Oral BID  . B-complex with vitamin C  1 tablet Oral Daily  . bacitracin-polymyxin b  1 application Topical BID  . dabigatran  75 mg Oral BID  . imipenem-cilastatin  250 mg Intravenous Q12H  . metoprolol  50 mg Oral BID  . pantoprazole  40 mg Oral Daily  . sodium chloride  3 mL Intravenous Q12H   . dextrose 50 mL/hr at 03/17/14 1312    Labs:  Recent Labs  03/15/14 1228 03/16/14 0400  NA 137 140  K 4.9 4.9  CL 106 109  CO2 17* 18*  GLUCOSE 197* 137*  BUN 59* 55*  CREATININE 1.74* 1.73*  CALCIUM 8.6 8.5  MG  --  1.8    Recent Labs  03/15/14 1228  AST 20  ALT 30  ALKPHOS 103  BILITOT 0.7  PROT 5.4*  ALBUMIN 2.8*    Recent Labs  03/15/14 1228 03/16/14 0400  WBC 20.8* 11.8*  NEUTROABS 19.9*  --   HGB 11.0* 9.9*  HCT 33.3* 30.5*  MCV 94.9 95.0  PLT 204 188    Recent Labs   03/15/14 1228  TROPONINI <0.30    Radiology/Studies: Dg Chest 2 View  03/15/2014   CLINICAL DATA:  Weakness  EXAM: CHEST  2 VIEW  COMPARISON:  Prior radiograph from 11/19/2012  FINDINGS: Right-sided pacemaker/AICD with electrodes overlying the right ventricle and right atrium are unchanged. Cardiomegaly is grossly stable.  There is diffuse pulmonary vascular congestion with indistinctness of the interstitial markings, suggesting mild pulmonary edema. A few scattered Kerley B-lines are noted. Bibasilar atelectasis is present as well. Small bilateral pleural effusions noted. No definite focal infiltrate. There is no pneumothorax.  Right shoulder arthroplasty partially visualized. Severe degenerative changes noted at the left shoulder. Diffuse osteopenia present. No acute osseus abnormality.  IMPRESSION: 1. Cardiomegaly with findings suggestive of mild diffuse pulmonary edema. 2. Small bilateral pleural effusions. 3. Mild bibasilar atelectasis.   Electronically Signed   By: Jeannine Boga M.D.   On: 03/15/2014 14:07   Dg Pelvis 1-2 Views  03/15/2014   CLINICAL DATA:  Weakness over the last 2-3 weeks. Multiple falls. History of breast cancer.  EXAM: PELVIS - 1-2 VIEW  COMPARISON:  None.  FINDINGS: Diffuse osteopenia is present. Left total hip arthroplasty.  Atherosclerosis. Partially visualized lumbar spondylosis. The obturator rings appear intact. No fracture is identified. Visualized sacral arcades appear within normal limits.  Old left inferior pubic ramus fracture.  IMPRESSION: Osteopenia.  No acute osseous abnormality.   Electronically Signed   By: Dereck Ligas M.D.   On: 03/15/2014 14:08   Ct Head Wo Contrast  03/15/2014   CLINICAL DATA:  Generalized weakness  EXAM: CT HEAD WITHOUT CONTRAST  CT CERVICAL SPINE WITHOUT CONTRAST  TECHNIQUE: Multidetector CT imaging of the head and cervical spine was performed following the standard protocol without intravenous contrast. Multiplanar CT image  reconstructions of the cervical spine were also generated.  COMPARISON:  CT HEAD W/O CM dated 03/09/2014; CT of the head and cervical spine without contrast dated 11/19/2012  FINDINGS: CT HEAD FINDINGS  There is moderate diffuse cerebral and cerebellar atrophy with compensatory ventriculomegaly. This is not out of proportion to the patient's age. There is decreased density in the deep white matter of both cerebral hemispheres consistent with chronic small vessel ischemic type change. There is no evidence of an acute ischemic infarction. The cerebellum and brainstem are normal in density.  At bone window settings there is soft tissue density material within the right sphenoid sinus which is stable. This may reflect chronic inflammation. There are no sinus air-fluid levels. The mastoid air cells are well pneumatized. There is no evidence of an acute skull fracture. There remain rounded lucencies within the frontal and parietal bones on the right.  CT CERVICAL SPINE FINDINGS  There is stable loss of the normal cervical lordosis. There is stable anterolisthesis of C3 with respect to C4 amounting to approximately 4 mm. There is stable disc space narrowing at C3-4, C4-5, C5-6, and C6-7. The prevertebral soft tissue spaces appear normal. There is no perched facet. There is fusion across the facets at the C4-5 level on the right. The odontoid is intact and the lateral masses of C1 align normally with those of C2. The bony ring at the cervical level is intact. The observed portions of the first and second ribs appear normal. The pulmonary apices are clear.  IMPRESSION: 1. There is no evidence of an acute ischemic or hemorrhagic infarction. There are age related atrophic changes. There is no intracranial mass effect. 2. There stable lucencies within the right frontal and right parietal bones consistent with the clinically suspected multiple myeloma. 3. There is no evidence of an acute cervical spine fracture nor dislocation. No  lytic or blastic vertebral body or posterior element lesion is demonstrated. There are degenerative disc and facet joint changes at multiple cervical levels. Stable grade 1 anterolisthesis of C3 with respect to C4 is present.   Electronically Signed   By: David  Martinique   On: 03/15/2014 14:50   Ct Cervical Spine Wo Contrast  03/15/2014   CLINICAL DATA:  Generalized weakness  EXAM: CT HEAD WITHOUT CONTRAST  CT CERVICAL SPINE WITHOUT CONTRAST  TECHNIQUE: Multidetector CT imaging of the head and cervical spine was performed following the standard protocol without intravenous contrast. Multiplanar CT image reconstructions of the cervical spine were also generated.  COMPARISON:  CT HEAD W/O CM dated 03/09/2014; CT of the head and cervical spine without contrast dated 11/19/2012  FINDINGS: CT HEAD FINDINGS  There is moderate diffuse cerebral and cerebellar atrophy with compensatory ventriculomegaly. This is not out of proportion to the patient's age. There is decreased density in the deep white matter of both cerebral hemispheres consistent with chronic small vessel ischemic type change.  There is no evidence of an acute ischemic infarction. The cerebellum and brainstem are normal in density.  At bone window settings there is soft tissue density material within the right sphenoid sinus which is stable. This may reflect chronic inflammation. There are no sinus air-fluid levels. The mastoid air cells are well pneumatized. There is no evidence of an acute skull fracture. There remain rounded lucencies within the frontal and parietal bones on the right.  CT CERVICAL SPINE FINDINGS  There is stable loss of the normal cervical lordosis. There is stable anterolisthesis of C3 with respect to C4 amounting to approximately 4 mm. There is stable disc space narrowing at C3-4, C4-5, C5-6, and C6-7. The prevertebral soft tissue spaces appear normal. There is no perched facet. There is fusion across the facets at the C4-5 level on the  right. The odontoid is intact and the lateral masses of C1 align normally with those of C2. The bony ring at the cervical level is intact. The observed portions of the first and second ribs appear normal. The pulmonary apices are clear.  IMPRESSION: 1. There is no evidence of an acute ischemic or hemorrhagic infarction. There are age related atrophic changes. There is no intracranial mass effect. 2. There stable lucencies within the right frontal and right parietal bones consistent with the clinically suspected multiple myeloma. 3. There is no evidence of an acute cervical spine fracture nor dislocation. No lytic or blastic vertebral body or posterior element lesion is demonstrated. There are degenerative disc and facet joint changes at multiple cervical levels. Stable grade 1 anterolisthesis of C3 with respect to C4 is present.   Electronically Signed   By: David  Martinique   On: 03/15/2014 14:50    Echocardiogram: pending Device interrogation: performed by industry today - normal PPM function (see printout in chart)   Assessment and Plan  Persistent weakness and neuropathy  Paroxysmal atrial fibrillation  Nonischemic cardiomyopathy  Acute on chronic systolic HF  Echo pending. Amiodarone discontinued yesterday.   Signed, Azzie Roup Edmisten PA-C

## 2014-03-17 NOTE — Progress Notes (Signed)
Hypoglycemic Event  CBG:26  Treatment: D50 IV 50 mL  Symptoms: Shaky  Follow-up CBG: Time:1342 CBG Result:178  Possible Reasons for Event: Medication regimen: pt with elevated creatinine on glipizide  Comments/MD notified:Dr. Ronna Polio Torie Priebe  Remember to initiate Hypoglycemia Order Set & complete

## 2014-03-17 NOTE — Progress Notes (Signed)
Hypoglycemic Event  CBG:52  Treatment: 15 GM carbohydrate snack  Symptoms: None  Follow-up CBG: ELTR:3202 CBG Result:84  Possible Reasons for Event: Inadequate meal intake, crt. 1.73 pt was taking glipizide  Comments/MD notified:Dr. Ronna Polio Halia Franey  Remember to initiate Hypoglycemia Order Set & complete

## 2014-03-17 NOTE — Progress Notes (Signed)
[facility] PROGRESS NOTE  Sophia Oliver EYC:[id number] DOB: 05-Apr-1924 DOA: 03/15/2014 PCP: [name], MD  Assessment/Plan: 80 y.o. female, Sophia Oliver is a 80 y.o. female, history of sinus node dysfunction status post pacemaker placement, atrial fibrillation on amiodarone along with Pradaxa all of which [place] EP, breast cancer, DJD, chronic kidney disease stage III baseline creatinine between 1.2-1.5, anxiety, chronic systolic heart failure EF 30%, who lives at home and has been feeling weak for the last 3-4 weeks, also having persistent falls found to have UTI  1. Persistent falls with leukocytosis with UTI; neuro exam no focal;  -cont IV atx, f/u cultures; prelim E coli pend sensitive; changed atx to zosyn per previous cultures  2. History of tachybradycardia syndrome, underlying paroxysmal atrial fibrillation s/p PPM; episode of brady -Pt was seen evaluated by Dr. Cleda Mccreedy; defer to cardiology; called Dr. Sarina Ser discussed the  Case; he preferred to f/u as outpatient in his office after discharge    3. Diabetes mellitus type 2. Recent A1c was 8.7 -hold glipizide due to hypoglycemia likely due to CKD,cont ISS:  4. Lytic lesions noted on CT scan head last admission and again this admission, SPEP is abnormal patient follow up with oncology for multiple myeloma workup as outpatient  5. Leukocytosis. Secondary to UTI plus possible underlying multiple myeloma. competed atx for cellulitis  6. Chronic systolic CHF with EF 14%. congestion on chest x-ray; but no respiratory distress;  -will start lasix prn; monitor; I/O; daily weight; labs; pend repeat echo'    Code Status: full Family Communication:  D/w patient, husband at the bedside (indicate person spoken with, relationship, and if by phone, the number) Disposition Plan: SNF 24-48 hours    Consultants:  EP  Procedures:  none  Antibiotics:  Ceftriaxone  (indicate start date, and stop date if  known)  HPI/Subjective: alert  Objective: Filed Vitals:   03/17/14 0608  BP: 114/70  Pulse: 89  Temp: 97.4 F (36.3 C)  Resp: 18    Intake/Output Summary (Last 24 hours) at 03/17/14 1045 Last data filed at 03/17/14 0904  Gross per 24 hour  Intake    240 ml  Output      0 ml  Net    240 ml   Filed Weights   03/15/14 2140 03/16/14 0517 03/17/14 0608  Weight: 50.758 kg (111 lb 14.4 oz) 50.7 kg (111 lb 12.4 oz) 51.3 kg (113 lb 1.5 oz)    Exam:   General:  alert  Cardiovascular: s1,s2 rrr  Respiratory: CTA BL  Abdomen: s1,2 rr  Musculoskeletal: no LE edema   Data Reviewed: Basic Metabolic Panel:  Recent Labs Lab 03/11/14 0604 03/12/14 0600 03/15/14 1228 03/16/14 0400  NA 139 141 137 140  K 4.1 4.4 4.9 4.9  CL 106 110 106 109  CO2 18* 16* 17* 18*  GLUCOSE 62* 67* 197* 137*  BUN 66* 63* 59* 55*  CREATININE 1.71* 1.78* 1.74* 1.73*  CALCIUM 8.5 8.3* 8.6 8.5  MG  --   --   --  1.8   Liver Function Tests:  Recent Labs Lab 03/10/14 1310 03/11/14 0604 03/15/14 1228  AST  --  22 20  ALT  --  23 30  ALKPHOS  --  106 103  BILITOT  --  0.5 0.7  PROT 5.4* 5.5* 5.4*  ALBUMIN  --  2.9* 2.8*   No results found for this basename: LIPASE, AMYLASE,  in the last 168 hours No results found for this basename: AMMONIA,  in the last 168 hours CBC:  Recent Labs Lab 03/12/14 0600 03/15/14 1228 03/16/14 0400  WBC 10.0 20.8* 11.8*  NEUTROABS 8.5* 19.9*  --   HGB 10.7* 11.0* 9.9*  HCT 33.5* 33.3* 30.5*  MCV 98.2 94.9 95.0  PLT 237 204 188   Cardiac Enzymes:  Recent Labs Lab 03/15/14 1228  TROPONINI <0.30   BNP (last 3 results)  Recent Labs  12/06/13 1129  PROBNP 200.0*   CBG:  Recent Labs Lab 03/16/14 2221 03/16/14 2343 03/17/14 0610 03/17/14 0753 03/17/14 0853  GLUCAP 60* 71 88 52* 84    Recent Results (from the past 240 hour(s))  URINE CULTURE     Status: None   Collection Time    03/09/14 11:48 AM      Result Value Ref Range  Status   Specimen Description URINE, CATHETERIZED   Final   Special Requests NONE   Final   Culture  Setup Time     Final   Value: 03/09/2014 17:47     Performed at [place]     Final   Value: NO GROWTH     Performed at [facility]   Culture     Final   Value: NO GROWTH     Performed at [facility]   Report Status 03/10/2014 FINAL   Final  URINE CULTURE     Status: None   Collection Time    03/10/14  3:10 PM      Result Value Ref Range Status   Specimen Description URINE, RANDOM   Final   Special Requests NONE   Final   Culture  Setup Time     Final   Value: 03/10/2014 18:40     Performed at [place]     Final   Value: >=100,000 COLONIES/ML     Performed at [facility]   Culture     Final   Value: Multiple bacterial morphotypes present, none predominant. Suggest appropriate recollection if clinically indicated.     Performed at [facility]   Report Status 03/11/2014 FINAL   Final  URINE CULTURE     Status: None   Collection Time    03/15/14  2:58 PM      Result Value Ref Range Status   Specimen Description URINE, CLEAN CATCH   Final   Special Requests NONE   Final   Culture  Setup Time     Final   Value: 03/15/2014 15:43     Performed at [place]     Final   Value: >=100,000 COLONIES/ML     Performed at [facility]   Culture     Final   Value: ESCHERICHIA COLI     Performed at [facility]   Report Status PENDING   Incomplete  CULTURE, BLOOD (ROUTINE X 2)     Status: None   Collection Time    03/15/14  4:50 PM      Result Value Ref Range Status   Specimen Description BLOOD ARM RIGHT   Final   Special Requests BOTTLES DRAWN AEROBIC AND ANAEROBIC 10CC   Final   Culture  Setup Time     Final   Value: 03/15/2014 21:42     Performed at [facility]   Culture     Final   Value:        BLOOD CULTURE RECEIVED  NO GROWTH TO DATE  CULTURE WILL BE HELD FOR 5 DAYS BEFORE ISSUING A FINAL NEGATIVE REPORT     Performed at [facility]   Report Status PENDING   Incomplete  CULTURE, BLOOD (ROUTINE X 2)     Status: None   Collection Time    03/15/14  5:00 PM      Result Value Ref Range Status   Specimen Description BLOOD ARM RIGHT   Final   Special Requests BOTTLES DRAWN AEROBIC ONLY 10CC   Final   Culture  Setup Time     Final   Value: 03/15/2014 21:43     Performed at [facility]   Culture     Final   Value:        BLOOD CULTURE RECEIVED NO GROWTH TO DATE CULTURE WILL BE HELD FOR 5 DAYS BEFORE ISSUING A FINAL NEGATIVE REPORT     Performed at [facility]   Report Status PENDING   Incomplete     Studies: Dg Chest 2 View  03/15/2014   CLINICAL DATA:  Weakness  EXAM: CHEST  2 VIEW  COMPARISON:  Prior radiograph from 11/19/2012  FINDINGS: Right-sided pacemaker/AICD with electrodes overlying the right ventricle and right atrium are unchanged. Cardiomegaly is grossly stable.  There is diffuse pulmonary vascular congestion with indistinctness of the interstitial markings, suggesting mild pulmonary edema. A few scattered Kerley B-lines are noted. Bibasilar atelectasis is present as well. Small bilateral pleural effusions noted. No definite focal infiltrate. There is no pneumothorax.  Right shoulder arthroplasty partially visualized. Severe degenerative changes noted at the left shoulder. Diffuse osteopenia present. No acute osseus abnormality.  IMPRESSION: 1. Cardiomegaly with findings suggestive of mild diffuse pulmonary edema. 2. Small bilateral pleural effusions. 3. Mild bibasilar atelectasis.   Electronically Signed   By: Benjamin  McClintock M.D.   On: 03/15/2014 14:07   Dg Pelvis 1-2 Views  03/15/2014   CLINICAL DATA:  Weakness over the last 2-3 weeks. Multiple falls. History of breast cancer.  EXAM: PELVIS - 1-2 VIEW  COMPARISON:  None.  FINDINGS: Diffuse osteopenia is present. Left total hip  arthroplasty. Atherosclerosis. Partially visualized lumbar spondylosis. The obturator rings appear intact. No fracture is identified. Visualized sacral arcades appear within normal limits.  Old left inferior pubic ramus fracture.  IMPRESSION: Osteopenia.  No acute osseous abnormality.   Electronically Signed   By: Geoffrey  Lamke M.D.   On: 03/15/2014 14:08   Ct Head Wo Contrast  03/15/2014   CLINICAL DATA:  Generalized weakness  EXAM: CT HEAD WITHOUT CONTRAST  CT CERVICAL SPINE WITHOUT CONTRAST  TECHNIQUE: Multidetector CT imaging of the head and cervical spine was performed following the standard protocol without intravenous contrast. Multiplanar CT image reconstructions of the cervical spine were also generated.  COMPARISON:  CT HEAD W/O CM dated 03/09/2014; CT of the head and cervical spine without contrast dated 11/19/2012  FINDINGS: CT HEAD FINDINGS  There is moderate diffuse cerebral and cerebellar atrophy with compensatory ventriculomegaly. This is not out of proportion to the patient's age. There is decreased density in the deep white matter of both cerebral hemispheres consistent with chronic small vessel ischemic type change. There is no evidence of an acute ischemic infarction. The cerebellum and brainstem are normal in density.  At bone window settings there is soft tissue density material within the right sphenoid sinus which is stable. This may reflect chronic inflammation. There are no sinus air-fluid levels. The mastoid air cells are well pneumatized. There is   no evidence of an acute skull fracture. There remain rounded lucencies within the frontal and parietal bones on the right.  CT CERVICAL SPINE FINDINGS  There is stable loss of the normal cervical lordosis. There is stable anterolisthesis of C3 with respect to C4 amounting to approximately 4 mm. There is stable disc space narrowing at C3-4, C4-5, C5-6, and C6-7. The prevertebral soft tissue spaces appear normal. There is no perched facet. There  is fusion across the facets at the C4-5 level on the right. The odontoid is intact and the lateral masses of C1 align normally with those of C2. The bony ring at the cervical level is intact. The observed portions of the first and second ribs appear normal. The pulmonary apices are clear.  IMPRESSION: 1. There is no evidence of an acute ischemic or hemorrhagic infarction. There are age related atrophic changes. There is no intracranial mass effect. 2. There stable lucencies within the right frontal and right parietal bones consistent with the clinically suspected multiple myeloma. 3. There is no evidence of an acute cervical spine fracture nor dislocation. No lytic or blastic vertebral body or posterior element lesion is demonstrated. There are degenerative disc and facet joint changes at multiple cervical levels. Stable grade 1 anterolisthesis of C3 with respect to C4 is present.   Electronically Signed   By: David  Jordan   On: 03/15/2014 14:50   Ct Cervical Spine Wo Contrast  03/15/2014   CLINICAL DATA:  Generalized weakness  EXAM: CT HEAD WITHOUT CONTRAST  CT CERVICAL SPINE WITHOUT CONTRAST  TECHNIQUE: Multidetector CT imaging of the head and cervical spine was performed following the standard protocol without intravenous contrast. Multiplanar CT image reconstructions of the cervical spine were also generated.  COMPARISON:  CT HEAD W/O CM dated 03/09/2014; CT of the head and cervical spine without contrast dated 11/19/2012  FINDINGS: CT HEAD FINDINGS  There is moderate diffuse cerebral and cerebellar atrophy with compensatory ventriculomegaly. This is not out of proportion to the patient's age. There is decreased density in the deep white matter of both cerebral hemispheres consistent with chronic small vessel ischemic type change. There is no evidence of an acute ischemic infarction. The cerebellum and brainstem are normal in density.  At bone window settings there is soft tissue density material within the  right sphenoid sinus which is stable. This may reflect chronic inflammation. There are no sinus air-fluid levels. The mastoid air cells are well pneumatized. There is no evidence of an acute skull fracture. There remain rounded lucencies within the frontal and parietal bones on the right.  CT CERVICAL SPINE FINDINGS  There is stable loss of the normal cervical lordosis. There is stable anterolisthesis of C3 with respect to C4 amounting to approximately 4 mm. There is stable disc space narrowing at C3-4, C4-5, C5-6, and C6-7. The prevertebral soft tissue spaces appear normal. There is no perched facet. There is fusion across the facets at the C4-5 level on the right. The odontoid is intact and the lateral masses of C1 align normally with those of C2. The bony ring at the cervical level is intact. The observed portions of the first and second ribs appear normal. The pulmonary apices are clear.  IMPRESSION: 1. There is no evidence of an acute ischemic or hemorrhagic infarction. There are age related atrophic changes. There is no intracranial mass effect. 2. There stable lucencies within the right frontal and right parietal bones consistent with the clinically suspected multiple myeloma. 3. There is no evidence   of an acute cervical spine fracture nor dislocation. No lytic or blastic vertebral body or posterior element lesion is demonstrated. There are degenerative disc and facet joint changes at multiple cervical levels. Stable grade 1 anterolisthesis of C3 with respect to C4 is present.   Electronically Signed   By: David  Jordan   On: 03/15/2014 14:50   Dg Foot Complete Left  03/15/2014   CLINICAL DATA:  Left foot pain. Lateral foot pain this following a fall.  EXAM: LEFT FOOT - COMPLETE 3+ VIEW  COMPARISON:  None.  FINDINGS: The alignment of the left foot is anatomic. Moderate first MTP joint osteoarthritis. Osteopenia. No displaced fracture is present. Fifth metatarsal appears within normal limits. Diabetic type  small vessel atherosclerotic calcification. Second toe PIP joint severe degenerative changes are present, which may represent posttraumatic osteoarthritis or erosive osteoarthritis.  IMPRESSION: No acute osseous abnormality.  Osteopenia.   Electronically Signed   By: Geoffrey  Lamke M.D.   On: 03/15/2014 14:12    Scheduled Meds: . ALPRAZolam  0.25 mg Oral BID  . B-complex with vitamin C  1 tablet Oral Daily  . bacitracin-polymyxin b  1 application Topical BID  . cefTRIAXone (ROCEPHIN)  IV  1 g Intravenous Q24H  . dabigatran  75 mg Oral BID  . glipiZIDE  5 mg Oral QAC breakfast  . insulin aspart  0-9 Units Subcutaneous TID WC  . metoprolol  50 mg Oral BID  . nitrofurantoin (macrocrystal-monohydrate)  100 mg Oral QHS  . pantoprazole  40 mg Oral Daily  . sodium chloride  3 mL Intravenous Q12H   Continuous Infusions:   Active Problems:   CKD (chronic kidney disease), stage III   UTI (lower urinary tract infection)   Pacemaker   Tachy-brady syndrome   PAF (paroxysmal atrial fibrillation)   General weakness   Type II or unspecified type diabetes mellitus without mention of complication, not stated as uncontrolled   Syncope    Time spent: >35 minutes     Ulugbek N Buriev  [facility] Pager [phone]. If 7PM-7AM, please contact night-coverage at [email or web address], password [email or web address] 03/17/2014, 10:45 AM  LOS: 2 days              

## 2014-03-17 NOTE — Progress Notes (Signed)
ANTIBIOTIC CONSULT NOTE - INITIAL  Pharmacy Consult for Zosyn Indication: UTI  Allergies  Allergen Reactions  . Codeine Nausea And Vomiting  . Other Nausea And Vomiting    Narcotics  dizziness    Patient Measurements: Height: 5\' 1"  (154.9 cm) Weight: 113 lb 1.5 oz (51.3 kg) IBW/kg (Calculated) : 47.8  Vital Signs: Temp: 97.4 F (36.3 C) (05/14 3016) Temp src: Oral (05/14 0608) BP: 114/70 mmHg (05/14 0109) Pulse Rate: 100 (05/14 1134) Intake/Output from previous day: 05/13 0701 - 05/14 0700 In: 600 [P.O.:600] Out: -  Intake/Output from this shift: Total I/O In: 240 [P.O.:240] Out: -   Labs:  Recent Labs  03/15/14 1228 03/16/14 0400  WBC 20.8* 11.8*  HGB 11.0* 9.9*  PLT 204 188  CREATININE 1.74* 1.73*   Estimated Creatinine Clearance: 17.3 ml/min (by C-G formula based on Cr of 1.73).    Microbiology: Recent Results (from the past 720 hour(s))  URINE CULTURE     Status: None   Collection Time    03/09/14 11:48 AM      Result Value Ref Range Status   Specimen Description URINE, CATHETERIZED   Final   Special Requests NONE   Final   Culture  Setup Time     Final   Value: 03/09/2014 17:47     Performed at [place]     Final   Value: NO GROWTH     Performed at [facility]   Culture     Final   Value: NO GROWTH     Performed at [facility]   Report Status 03/10/2014 FINAL   Final  URINE CULTURE     Status: None   Collection Time    03/10/14  3:10 PM      Result Value Ref Range Status   Specimen Description URINE, RANDOM   Final   Special Requests NONE   Final   Culture  Setup Time     Final   Value: 03/10/2014 18:40     Performed at [facility]     Final   Value: >=100,000 COLONIES/ML     Performed at [facility]   Culture     Final   Value: Multiple bacterial morphotypes present, none predominant. Suggest appropriate recollection if clinically indicated.     Performed  at [facility]   Report Status 03/11/2014 FINAL   Final  URINE CULTURE     Status: None   Collection Time    03/15/14  2:58 PM      Result Value Ref Range Status   Specimen Description URINE, CLEAN CATCH   Final   Special Requests NONE   Final   Culture  Setup Time     Final   Value: 03/15/2014 15:43     Performed at [place]     Final   Value: >=100,000 COLONIES/ML     Performed at [facility]   Culture     Final   Value: ESCHERICHIA COLI     Performed at [facility]   Report Status PENDING   Incomplete  CULTURE, BLOOD (ROUTINE X 2)     Status: None   Collection Time    03/15/14  4:50 PM      Result Value Ref Range Status   Specimen Description BLOOD ARM RIGHT   Final   Special Requests BOTTLES DRAWN AEROBIC AND ANAEROBIC  10CC   Final   Culture  Setup Time     Final   Value: 03/15/2014 21:42     Performed at [facility]   Culture     Final   Value:        BLOOD CULTURE RECEIVED NO GROWTH TO DATE CULTURE WILL BE HELD FOR 5 DAYS BEFORE ISSUING A FINAL NEGATIVE REPORT     Performed at [facility]   Report Status PENDING   Incomplete  CULTURE, BLOOD (ROUTINE X 2)     Status: None   Collection Time    03/15/14  5:00 PM      Result Value Ref Range Status   Specimen Description BLOOD ARM RIGHT   Final   Special Requests BOTTLES DRAWN AEROBIC ONLY 10CC   Final   Culture  Setup Time     Final   Value: 03/15/2014 21:43     Performed at [facility]   Culture     Final   Value:        BLOOD CULTURE RECEIVED NO GROWTH TO DATE CULTURE WILL BE HELD FOR 5 DAYS BEFORE ISSUING A FINAL NEGATIVE REPORT     Performed at [facility]   Report Status PENDING   Incomplete    Medical History: Past Medical History  Diagnosis Date  . Syncope   . Sinus node dysfunction     a. s/p pacemaker.  . Wide-complex tachycardia   . Cholelithiasis   . Glucose intolerance (impaired glucose tolerance)   . Anxiety    . PONV (postoperative nausea and vomiting)   . Chronic kidney disease     a. Baseline Cr reported 1.8-2.0.  . Injury of right upper arm     after venipuncture in 3/13 - pt describes pain afterwards- no followup done seen by PCP- no further treatment   . PAF (paroxysmal atrial fibrillation)   . Left bundle branch block   . Tachycardia-bradycardia syndrome   . CHF (congestive heart failure)     a. EF 30-35% by echo 2015.  [name] Pacemaker     Guidant, implanted 2006  . Type II diabetes mellitus     "recently dx'd" (03/09/2014)  . Breast cancer 2003    "radiation on right; mastectomy on the left"  . Hypertension   . GERD (gastroesophageal reflux disease)   . DJD (degenerative joint disease)   . Arthritis     "real bad in my lower back" (03/09/2014)  . Chronic lower back pain   . Orthostatic hypotension     a. Diuretics d/c'd 03/2014.  [name] PAT (paroxysmal atrial tachycardia)     Medications:  Scheduled:  . ALPRAZolam  0.25 mg Oral BID  . B-complex with vitamin C  1 tablet Oral Daily  . bacitracin-polymyxin b  1 application Topical BID  . dabigatran  75 mg Oral BID  . furosemide  20 mg Oral Once  . insulin aspart  0-9 Units Subcutaneous TID WC  . metoprolol  50 mg Oral BID  . pantoprazole  40 mg Oral Daily  . sodium chloride  3 mL Intravenous Q12H   Infusions:   Assessment: 80 yo F with a 3-4 week hx of weakness and persistent falls.  Pt had elevated WBC on admit and Ecoli UTI (sensitivites pending).  Changing Rocephin to Zosyn.  Goal of Therapy:  Renal dose adjustment of antibiotics  Plan:  Zosyn 2.25gm IV q8h. Pharmacy will sign off.  Would increase dose if CrCl improves  to >20 ml/min.  [employer], Pharm.D., BCPS Clinical Pharmacist Pager [phone] 03/17/2014 12:14 PM

## 2014-03-17 NOTE — Progress Notes (Addendum)
[facility] PROGRESS NOTE  LEEANN BADY VWU:[id number] DOB: 1924-01-06 DOA: 03/15/2014 PCP: [name], MD  Patient is still hypoglycemia likely due to CKD on PO glipizde -given d 50%; cont d 10% drip; monitor closely accuchecks; already stopped glipizide -hold ISS   Kinnie Feil  [facility] Pager [phone]. If 7PM-7AM, please contact night-coverage at [email or web address], password [email or web address] 03/17/2014, 1:10 PM  LOS: 2 days

## 2014-03-18 LAB — GLUCOSE, CAPILLARY
GLUCOSE-CAPILLARY: 107 mg/dL — AB (ref 70–99)
GLUCOSE-CAPILLARY: 110 mg/dL — AB (ref 70–99)
GLUCOSE-CAPILLARY: 152 mg/dL — AB (ref 70–99)
GLUCOSE-CAPILLARY: 196 mg/dL — AB (ref 70–99)
GLUCOSE-CAPILLARY: 240 mg/dL — AB (ref 70–99)
Glucose-Capillary: 234 mg/dL — ABNORMAL HIGH (ref 70–99)
Glucose-Capillary: 133 mg/dL — ABNORMAL HIGH (ref 70–99)

## 2014-03-18 LAB — TECHNOLOGIST SMEAR REVIEW

## 2014-03-18 MED ORDER — INSULIN ASPART 100 UNIT/ML ~~LOC~~ SOLN
0.0000 [IU] | Freq: Three times a day (TID) | SUBCUTANEOUS | Status: DC
  Administered 2014-03-20 (×2): 3 [IU] via SUBCUTANEOUS
  Administered 2014-03-21: 2 [IU] via SUBCUTANEOUS
  Administered 2014-03-21 – 2014-03-22 (×2): 1 [IU] via SUBCUTANEOUS

## 2014-03-18 MED ORDER — FUROSEMIDE 10 MG/ML IJ SOLN
20.0000 mg | Freq: Once | INTRAMUSCULAR | Status: AC
  Administered 2014-03-18: 20 mg via INTRAVENOUS
  Filled 2014-03-18: qty 2

## 2014-03-18 NOTE — Consult Note (Signed)
[place]  Telephone:(336) [place] NOTE  STEPHANEY STEVEN                                MR#: [id number]  DOB: 07-12-1924                       CSN#: [id number]  Referring MD:  [facility] Primary MD:  [name], MD      Reason for Consult: Multiple Myeloma   DDU:KGUR Lemmie Sophia Oliver is a 80 y.o. white  female asked to see in consultation for evaluation of  Possible multiple myeloma. She has extensive cardiac and history, prior breast cancer history treated with resection and radiation but no chemo, as well as  renal insufficiency, admitted on 5/12 with a 4 week history of recurrent falls, generalized weakness and dehydration. She was diagnosed with recurrent UTI.  Patient had been recently seen in the hospital with same issues. A CT of the head on 5/6 remarkable for lytic lesions at the right frontal bone and right parietal bone, new from prior films.  Additional lab work included SPEP and UPEP with Myeloma panel, which results became available after patient's discharge on 5/9.  As of 5/7, SPEP with immunofixation was negative for M Spike with slightly decreased gamma globulins. UPEP with immunofixation  was negative for monoclonal free light chains (Bence Jones Protein) but polyclonal increase in free Kappa (11.2) and/or free Lambda light chains (0.88) with ratio of 12.73.  Beta 2 was normal at 4.5. She was to follow up with Primary MD to arrange Oncology evaluation if needed, but she was readmitted soon after.   A new CT of the head negative for intracranial masses or hemorrhage, but confirming stable lucencies within the right frontal and right parietal bones consistent with the clinically suspected multiple myeloma. No lytic or blastic vertebral body or posterior element lesion is demonstrated. Metastatic bone survey on 5/8 was negative for metastatic disease. UA is negative for protein.  Sodium is normal. She has mild anemia, normal  platelets and white cells now. She has mild renal insufficiency which is chronic. Albumin is low at 2.8. Calcium is normal   PMH:  Past Medical History  Diagnosis Date  . Syncope   . Sinus node dysfunction     a. s/p pacemaker.  . Wide-complex tachycardia   . Cholelithiasis   . Glucose intolerance (impaired glucose tolerance)   . Anxiety   . PONV (postoperative nausea and vomiting)   . Chronic kidney disease     a. Baseline Cr reported 1.8-2.0.  . Injury of right upper arm     after venipuncture in 3/13 - pt describes pain afterwards- no followup done seen by PCP- no further treatment   . PAF (paroxysmal atrial fibrillation)   . Left bundle branch block   . Tachycardia-bradycardia syndrome   . CHF (congestive heart failure)     a. EF 30-35% by echo 2015.  [name] Pacemaker     Guidant, implanted 2006  . Type II diabetes mellitus     "recently dx'd" (03/09/2014)  . Breast cancer 2003    "radiation on right; mastectomy on the left"  . Hypertension   . GERD (gastroesophageal reflux disease)   . DJD (degenerative joint disease)   . Arthritis     "real bad in my lower back" (03/09/2014)  .  Chronic lower back pain   . Orthostatic hypotension     a. Diuretics d/c'd 03/2014.  [name] PAT (paroxysmal atrial tachycardia)     Surgeries:  Past Surgical History  Procedure Laterality Date  . Replacement total knee bilateral Bilateral   . Total shoulder replacement    . Tonsillectomy    . Orif distal femur fracture Right 12/2007  . Tonsillectomy    . Mastectomy Left 2003  . Total hip arthroplasty  06/16/2012    Procedure: TOTAL HIP ARTHROPLASTY;  Surgeon: Mauri Pole, MD;  Location: [facility];  Service: Orthopedics;  Laterality: Left;  . Hip closed reduction  06/30/2012    Procedure: CLOSED MANIPULATION HIP;  Surgeon: Mauri Pole, MD;  Location: [facility];  Service: Orthopedics;  Laterality: Left;  . Abdominal hysterectomy    . Pacemaker insertion      Guidant Insignia I  . Cataract extraction  w/ intraocular lens  implant, bilateral Bilateral 2000's  . Refractive surgery Bilateral 01/2014  . Inguinal hernia repair Left     "had emergency OR for it a few years ago" (03/09/2014)  . Joint replacement    . Breast biopsy Bilateral     Allergies:  Allergies  Allergen Reactions  . Codeine Nausea And Vomiting  . Other Nausea And Vomiting    Narcotics  dizziness    Medications:   Prior to Admission:  . ALPRAZolam  0.25 mg Oral BID  . B-complex with vitamin C  1 tablet Oral Daily  . bacitracin-polymyxin b  1 application Topical BID  . dabigatran  75 mg Oral BID  . furosemide  20 mg Intravenous Once  . imipenem-cilastatin  250 mg Intravenous Q12H  . insulin aspart  0-9 Units Subcutaneous TID WC  . metoprolol  50 mg Oral BID  . pantoprazole  40 mg Oral Daily  . sodium chloride  3 mL Intravenous Q12H    EYC:XKGYJEHUDJS-HFWYOVZCHYIFOYDX, HYDROcodone-acetaminophen, ondansetron (ZOFRAN) IV, ondansetron, polyethylene glycol  ROS: Constitutional: Denies fevers, chills or abnormal night sweats. She admits to weight loss, unknown amount Eyes: Denies blurriness of vision, double vision or watery eyes Ears, nose, mouth, throat, and face: Denies mucositis or sore throat.  Respiratory: Denies cough, dyspnea at rest or wheezes Cardiovascular: Denies palpitations, chest discomfort or lower extremity swelling Gastrointestinal:  Denies nausea, heartburn or change in bowel habits Skin: Denies abnormal skin rashes. She does bruise easily Breasts: she denies new masses Lymphatics: Denies new lymphadenopathy or easy bruising Neurological:Denies numbness, tingling. Recurrent falls as in HPI Behavioral/Psych: Mood is stable, no new changes  All other systems were reviewed with the patient and are negative.   Family History:    Mother died in her 58's with heart problems. Father died in his 44's with kidney failure. She has siblings with diabetes. One brother died with bone cancer No  family history of hematological  disorders.  Social History:  reports that she has quit smoking. Her smoking use included Cigarettes. She has a 3 pack-year smoking history. She has never used smokeless tobacco. She reports that she drinks alcohol. She reports that she does not use illicit drugs.Married. One son. Lives in [place]   Physical Exam     Filed Vitals:   03/18/14 0613  BP: 104/60  Pulse: 85  Temp: 98.1 F (36.7 C)  Resp: 18   Filed Weights   03/16/14 0517 03/17/14 0608 03/18/14 0613  Weight: 111 lb 12.4 oz (50.7 kg) 113 lb 1.5 oz (51.3 kg) 109 lb 6.4 oz (  49.624 kg)    GENERAL:alert, no distress and comfortable. Ill appearing and frail SKIN: skin color, texture, turgor are normal, no rashes or significant lesions. Several areas of echymoses noted EYES: normal, conjunctiva are pink and non-injected, sclera clear OROPHARYNX:no exudate, no erythema and lips, buccal mucosa, and tongue normal  NECK: supple, thyroid normal size, non-tender, without nodularity LYMPH:  no palpable lymphadenopathy in the cervical, axillary or inguinal LUNGS: clear to auscultation and percussion with normal breathing effort Breasts: Right breast without masses. Left breast s/p resection with implant in place, nipple removed, no masses palpable. No chest wall abnormalities. HEART: regular rate & rhythm and no murmurs, Right pacemaker and left ICD in place.No lower extremity edema ABDOMEN:abdomen soft, non-tender and normal bowel sounds Musculoskeletal:no cyanosis of digits and no clubbing  PSYCH: alert & oriented x 3 with fluent speech NEURO: no focal motor/sensory deficits   Labs:    CBC   Recent Labs Lab 03/12/14 0600 03/15/14 1228 03/16/14 0400  WBC 10.0 20.8* 11.8*  HGB 10.7* 11.0* 9.9*  HCT 33.5* 33.3* 30.5*  PLT 237 204 188  MCV 98.2 94.9 95.0  MCH 31.4 31.3 30.8  MCHC 31.9 33.0 32.5  RDW 19.7* 20.0* 20.2*  LYMPHSABS 0.7 0.4*  --   MONOABS 0.8 0.4  --   EOSABS 0.1 0.0   --   BASOSABS 0.0 0.0  --      CMP    Recent Labs Lab 03/12/14 0600 03/15/14 1228 03/16/14 0400  NA 141 137 140  K 4.4 4.9 4.9  CL 110 106 109  CO2 16* 17* 18*  GLUCOSE 67* 197* 137*  BUN 63* 59* 55*  CREATININE 1.78* 1.74* 1.73*  CALCIUM 8.3* 8.6 8.5  MG  --   --  1.8  AST  --  20  --   ALT  --  30  --   ALKPHOS  --  103  --   BILITOT  --  0.7  --      Anemia panel:  No results found for this basename: VITAMINB12, FOLATE, FERRITIN, TIBC, IRON, RETICCTPCT,  in the last 72 hours   Imaging Studies:  Dg Chest 2 View  03/15/2014   CLINICAL DATA:  Weakness  EXAM: CHEST  2 VIEW  COMPARISON:  Prior radiograph from 11/19/2012  FINDINGS: Right-sided pacemaker/AICD with electrodes overlying the right ventricle and right atrium are unchanged. Cardiomegaly is grossly stable.  There is diffuse pulmonary vascular congestion with indistinctness of the interstitial markings, suggesting mild pulmonary edema. A few scattered Kerley B-lines are noted. Bibasilar atelectasis is present as well. Small bilateral pleural effusions noted. No definite focal infiltrate. There is no pneumothorax.  Right shoulder arthroplasty partially visualized. Severe degenerative changes noted at the left shoulder. Diffuse osteopenia present. No acute osseus abnormality.  IMPRESSION: 1. Cardiomegaly with findings suggestive of mild diffuse pulmonary edema. 2. Small bilateral pleural effusions. 3. Mild bibasilar atelectasis.   Electronically Signed   By: Jeannine Boga M.D.   On: 03/15/2014 14:07   Ct Head Wo Contrast  03/15/2014    COMPARISON:  CT HEAD W/O CM dated 03/09/2014; CT of the head and cervical spine without contrast dated 11/19/2012  FINDINGS: CT HEAD FINDINGS  There is moderate diffuse cerebral and cerebellar atrophy with compensatory ventriculomegaly. This is not out of proportion to the patient's age. There is decreased density in the deep white matter of both cerebral hemispheres consistent with  chronic small vessel ischemic type change. There is no evidence of an acute  ischemic infarction. The cerebellum and brainstem are normal in density.  At bone window settings there is soft tissue density material within the right sphenoid sinus which is stable. This may reflect chronic inflammation. There are no sinus air-fluid levels. The mastoid air cells are well pneumatized. There is no evidence of an acute skull fracture. There remain rounded lucencies within the frontal and parietal bones on the right.   IMPRESSION: 1. There is no evidence of an acute ischemic or hemorrhagic infarction. There are age related atrophic changes. There is no intracranial mass effect. 2. There stable lucencies within the right frontal and right parietal bones consistent with the clinically suspected multiple myeloma.  Ct Head Wo Contrast  03/09/2014   COMPARISON:  CT 11/19/2012  FINDINGS: Moderate atrophy. Moderate chronic microvascular ischemic change in the white matter. This is stable.  Negative for acute infarct.  Negative for hemorrhage or mass.  Rounded lytic lesions in the right frontal bone and right parietal bone are unchanged. These are indeterminate but could be seen with myeloma. Metastatic disease and benign causes are also possible.  IMPRESSION: Atrophy and chronic microvascular ischemia. No acute intracranial abnormality.  Lytic lesions right frontal bone and right parietal bone are stable. Question myeloma   Electronically Signed   By: Franchot Gallo M.D.   On: 03/09/2014 13:04   Ct Cervical Spine Wo Contrast  03/15/2014    CT CERVICAL SPINE FINDINGS  There is stable loss of the normal cervical lordosis. There is stable anterolisthesis of C3 with respect to C4 amounting to approximately 4 mm. There is stable disc space narrowing at C3-4, C4-5, C5-6, and C6-7. The prevertebral soft tissue spaces appear normal. There is no perched facet. There is fusion across the facets at the C4-5 level on the right. The  odontoid is intact and the lateral masses of C1 align normally with those of C2. The bony ring at the cervical level is intact. The observed portions of the first and second ribs appear normal. The pulmonary apices are clear.  IMPRESSION: 1. There is no evidence of an acute ischemic or hemorrhagic infarction. There are age related atrophic changes. There is no intracranial mass effect. 2. There stable lucencies within the right frontal and right parietal bones consistent with the clinically suspected multiple myeloma. 3. There is no evidence of an acute cervical spine fracture nor dislocation. No lytic or blastic vertebral body or posterior element lesion is demonstrated. There are degenerative disc and facet joint changes at multiple cervical levels. Stable grade 1 anterolisthesis of C3 with respect to C4 is present.   Electronically Signed   By: David  Martinique   On: 03/15/2014 14:50   Ct Head Wo Contrast   11/19/2012 *RADIOLOGY REPORT* Clinical Data: Dizziness, fall, hit head CT HEAD WITHOUT CONTRAST CT CERVICAL SPINE WITHOUT CONTRAST Technique: Multidetector CT imaging of the head and cervical spine was performed following the standard protocol without intravenous contrast. Multiplanar CT image reconstructions of the cervical spine were also generated. Comparison: None. CT HEAD Findings: No evidence of parenchymal hemorrhage or extra-axial fluid collection. No mass lesion, mass effect, or midline shift. No CT evidence of acute infarction. Subcortical white matter and periventricular small vessel ischemic changes. Intracranial atherosclerosis. Global cortical atrophy. No ventriculomegaly. Partial opacification of the right sphenoid sinus. Visualized paranasal sinuses and mastoid air cells are otherwise clear. No evidence of calvarial fracture. IMPRESSION: No evidence of acute intracranial abnormality. Atrophy with small vessel ischemic changes and intracranial atherosclerosis. CT CERVICAL SPINE Findings:  Straightening of the cervical spine. No evidence of fracture or dislocation. The vertebral body heights are maintained. Dens appears intact. No prevertebral soft tissue swelling. Moderate multilevel degenerative changes, most prominent at C4-5 and C5-6. 4 mm anterolisthesis of C3 on C4. IMPRESSION: No evidence of traumatic injury to the cervical spine. Moderate multilevel degenerative changes. 4 mm anterolisthesis of C3 on C4. Original Report Authenticated By: Julian Hy, M.D.    Dg Bone Survey Met  03/11/2014    COMPARISON:  Bone scan 02/05/2005.  FINDINGS: Standard metastatic bone survey performed. Images of the axial and appendicular skeleton obtained. No evidence of metastatic disease. No focal lytic or sclerotic bony lesions are noted. Postsurgical changes noted over both shoulders and over the chest. Postsurgical changes left hip and both knees. Severe degenerative changes noted throughout the spine.Peripheral vascular disease. Cardiomegaly is noted. Cardiac pacer with lead tips in right atrium right ventricle noted.  IMPRESSION: No evidence of metastatic disease.   Electronically Signed   By: Marcello Moores  Register   On: 03/11/2014 07:49    US Renal  03/17/2014    COMPARISON:  Abdominal CT 01/23/2012.  Renal ultrasound 01/08/2012.  FINDINGS: Right Kidney:  Length: 9.3 cm. There is progressive cortical thinning without focal cortical lesion or hydronephrosis. Mild perinephric fluid is present.  Left Kidney:  Length: 9.4 cm. There is progressive cortical thinning. There is a mildly septated cyst in the lower pole which measures 4.2 x 2.7 x 3.3 cm. No hydronephrosis. Moderate size left pleural effusion noted incidentally.  Bladder:  Appears normal for degree of bladder distention.  IMPRESSION: Under  1. Progressive bilateral renal cortical thinning consistent with chronic medical renal disease. 2. No evidence of hydronephrosis. 3. Mildly septated cyst in the lower pole of the left kidney.   Electronically  Signed   By: Camie Patience M.D.   On: 03/17/2014 19:19      2 D echo : LVEF 35% on 12/2013-->20 % (03/2014), dilated cardiomyopathy, pulmonary HTN, Dyskinesis of the basal-midanteroseptal myocardium  A/P:  1. Multiple lytic lesions on CT of the head, with negative metastatic bone survey. SPEP and UPEP negative for Bence Jones Protein and M Spike. However, slightly decreased gamma globulins and  polyclonal increase in free Kappa  and/or free Lambda light chains.  Bone marrow biopsy may be considered to rule out marrow process, including MGUS. Will check beta-2 microglobulin and save smear for rouleaux to further delineate possibility of myeloma. Will follow  2. History of Breast Cancer, more than 20 years ago. S/p left mastectomy, s/p radiation to breast. No chemotherapy at the time. Last mammogram in 2008, negative for abnormalities.   2. Full Code  Other medical issues as per admitting and cardiology team.  **Disclaimer: This note was dictated with voice recognition software. Similar sounding words can inadvertently be transcribed and this note may contain transcription errors which may not have been corrected upon publication of note.Rondel Jumbo, PA-C 03/18/2014 11:05 AM  Oncology attending addendum:   Patient was seen and examined for details please see the above note by Sharene Butters PA-C. This is a pleasant 80 year old woman I interviewed today with her husband. She has a past medical history significant for cardiac arrhythmia and chronically anticoagulated with Coumadin. She was hospitalized for recurrent falls and found to have a urinary tract infection. She was noted to have abnormalities in her serum protein electrophoresis with depressed quantitative immunoglobulins. There is no monoclonal protein detected in the serum. The urine protein electrophoresis showed a  polyclonal elevation of kappa and lambda. Skeletal survey failed to show any lytic lesions but the CT scan of her brain  showed a very small lucencies that are indeterminate.  Clinically, she denied any easy bruising but certainly have had bruises washes in the hospital. She denied any lower remedy edema or perry orbital ecchymosis. She certainly had congestive heart failure and cardiac arrhythmia.  On physical examination, she was awake lethargic woman did not appear in any distress. Her vitals appeared stable and documented in the chart. Head was normocephalic atraumatic without any ecchymosis or erythema. Neck was supple no lymphadenopathy. Heart is regular rate and rhythm with lungs are clear to auscultation. Abdomen is soft nontender with no ascites. Extremities had no edema. Skin head ecchymoses noted around the IV sites.   Laboratory data were reviewed today personally and documented in Sharene Butters PA-C note.   Assessment and plan:  This is an 80 year old woman with miscellaneous nonspecific findings and her protein studies. She has no monoclonal protein on her serum protein electrophoresis and only polyclonal findings in her urine. The lesion in the skull are very small and unclear if they are of malignant nature.  I see no clear-cut evidence to suggest multiple myeloma. There is no monoclonal protein which is the hallmark of a plasma cell disorder at this time. Take her polyclonal increase in the urine likely reactive in nature rather than a sign of a plasma cell disorder.   I suspicious for amyloidosis is low as well.She has very little to no protein in the urine and no other stigmata of amyloidosis.  I see really no clear-cut reason for a bone marrow biopsy or a fat pad biopsy at this time. I recommend repeating these tests in the future which sally have to be as an outpatient setting and there is a monoclonal protein detected, then we will consider a biopsy at that time.  Please call with any questions regarding this lovely woman.

## 2014-03-18 NOTE — Progress Notes (Signed)
Patient Name: Sophia Oliver      SUBJECTIVE:  C/o sob with significant interval worsening of LVEF 35>>15-20% Past Medical History  Diagnosis Date  . Syncope   . Sinus node dysfunction     a. s/p pacemaker.  . Wide-complex tachycardia   . Cholelithiasis   . Glucose intolerance (impaired glucose tolerance)   . Anxiety   . PONV (postoperative nausea and vomiting)   . Chronic kidney disease     a. Baseline Cr reported 1.8-2.0.  . Injury of right upper arm     after venipuncture in 3/13 - pt describes pain afterwards- no followup done seen by PCP- no further treatment   . PAF (paroxysmal atrial fibrillation)   . Left bundle branch block   . Tachycardia-bradycardia syndrome   . CHF (congestive heart failure)     a. EF 30-35% by echo 2015.  [name] Pacemaker     Guidant, implanted 2006  . Type II diabetes mellitus     "recently dx'd" (03/09/2014)  . Breast cancer 2003    "radiation on right; mastectomy on the left"  . Hypertension   . GERD (gastroesophageal reflux disease)   . DJD (degenerative joint disease)   . Arthritis     "real bad in my lower back" (03/09/2014)  . Chronic lower back pain   . Orthostatic hypotension     a. Diuretics d/c'd 03/2014.  [name] PAT (paroxysmal atrial tachycardia)     Scheduled Meds:  Scheduled Meds: . ALPRAZolam  0.25 mg Oral BID  . B-complex with vitamin C  1 tablet Oral Daily  . bacitracin-polymyxin b  1 application Topical BID  . dabigatran  75 mg Oral BID  . imipenem-cilastatin  250 mg Intravenous Q12H  . metoprolol  50 mg Oral BID  . pantoprazole  40 mg Oral Daily  . sodium chloride  3 mL Intravenous Q12H   Continuous Infusions: . dextrose      PHYSICAL EXAM Filed Vitals:   03/17/14 1134 03/17/14 1347 03/17/14 2004 03/18/14 0613  BP:  105/62 101/55 104/60  Pulse: 100 85 87 85  Temp:  97.4 F (36.3 C) 98 F (36.7 C) 98.1 F (36.7 C)  TempSrc:  Oral Oral Oral  Resp:  18 18 18   Height:      Weight:    109 lb 6.4 oz (49.624  kg)  SpO2:  94% 98% 98%    Well developed and nourished in no acute distress looks better than the other day HENT normal Neck supple with JVP-flat Clear Regular rate and rhythm, no murmurs or gallops Abd-soft with active BS No Clubbing cyanosis edema Skin-warm and dry A & Oriented  Grossly normal sensory and motor function   TELEMETRY: Reviewed telemetry pt in nsr:    Intake/Output Summary (Last 24 hours) at 03/18/14 0813 Last data filed at 03/17/14 1700  Gross per 24 hour  Intake    600 ml  Output      0 ml  Net    600 ml    LABS: Basic Metabolic Panel:  Recent Labs Lab 03/12/14 0600 03/15/14 1228 03/16/14 0400  NA 141 137 140  K 4.4 4.9 4.9  CL 110 106 109  CO2 16* 17* 18*  GLUCOSE 67* 197* 137*  BUN 63* 59* 55*  CREATININE 1.78* 1.74* 1.73*  CALCIUM 8.3* 8.6 8.5  MG  --   --  1.8   Cardiac Enzymes:  Recent Labs  03/15/14 1228  TROPONINI <  0.30   CBC:  Recent Labs Lab 03/12/14 0600 03/15/14 1228 03/16/14 0400  WBC 10.0 20.8* 11.8*  NEUTROABS 8.5* 19.9*  --   HGB 10.7* 11.0* 9.9*  HCT 33.5* 33.3* 30.5*  MCV 98.2 94.9 95.0  PLT 237 204 188   PROTIME: No results found for this basename: LABPROT, INR,  in the last 72 hours Liver Function Tests:  Recent Labs  03/15/14 1228  AST 20  ALT 30  ALKPHOS 103  BILITOT 0.7  PROT 5.4*  ALBUMIN 2.8*   No results found for this basename: LIPASE, AMYLASE,  in the last 72 hours BNP: BNP (last 3 results)  Recent Labs  12/06/13 1129  PROBNP 200.0*      ASSESSMENT AND PLAN:  Active Problems:   CKD (chronic kidney disease), stage III   UTI (lower urinary tract infection)   Pacemaker   Tachy-brady syndrome   PAF (paroxysmal atrial fibrillation)   General weakness   Type II or unspecified type diabetes mellitus without mention of complication, not stated as uncontrolled   Syncope  LBBB  The pts cardiac performance ahas deteriorated significantly It will be important to involve Dr  Fransico Setters call him i have spoken with the son  Signed, Deboraha Sprang MD  03/18/2014

## 2014-03-18 NOTE — Progress Notes (Addendum)
Clinical Social Work Department CLINICAL SOCIAL WORK PLACEMENT NOTE 03/18/2014  Patient:  GIRL, SCHISSLER  Account Number:  [id number] Admit date:  03/15/2014  Clinical Social Worker:  Berton Mount, Latanya Presser  Date/time:  03/18/2014 04:00 PM  Clinical Social Work is seeking post-discharge placement for this patient at the following level of care:   [place]   (*CSW will update this form in Epic as items are completed)   03/18/2014  Patient/family provided with [facility] Department of Clinical Social Work's list of facilities offering this level of care within the geographic area requested by the patient (or if unable, by the patient's family).  03/18/2014  Patient/family informed of their freedom to choose among providers that offer the needed level of care, that participate in Medicare, Medicaid or managed care program needed by the patient, have an available bed and are willing to accept the patient.  03/18/2014  Patient/family informed of [facility]' ownership interest in [facility], as well as of the fact that they are under no obligation to receive care at this facility.  PASARR submitted to EDS on 03/18/2014 PASARR number received from EDS on 03/18/2014  FL2 transmitted to all facilities in geographic area requested by pt/family on  03/18/2014 FL2 transmitted to all facilities within larger geographic area on   Patient informed that his/her managed care company has contracts with or will negotiate with  certain facilities, including the following:     Patient/family informed of bed offers received:  03/21/2014 Patient chooses bed at [facility] Physician recommends and patient chooses bed at    Patient to be transferred to  [facility] on  03/22/2014 Patient to be transferred to facility by [facility]  The following physician request were entered in Epic:   Additional Comments:   [place], [place]

## 2014-03-18 NOTE — Progress Notes (Signed)
PT Cancellation Note  Patient Details Name: SAYANA SALLEY MRN: [id number] DOB: [birth year]   Cancelled Treatment:     PT session cancelled per pt's request.  Stating MD just told her some "bad" news about her heart.  Pt emotional & requesting to hold for now.  Will attempt back later today if time permits.    Sarajane Marek, [place] [phone] 03/18/2014

## 2014-03-18 NOTE — Progress Notes (Signed)
[facility] PROGRESS NOTE  Sophia Oliver ZWC:[id number] DOB: 01-11-1924 DOA: 03/15/2014 PCP: [name], MD  Assessment/Plan: 80 y.o. female, Sophia Oliver is a 80 y.o. female, history of sinus node dysfunction status post pacemaker placement, atrial fibrillation on amiodarone along with Pradaxa all of which [place] EP, breast cancer, DJD, chronic kidney disease stage III baseline creatinine between 1.2-1.5, anxiety, chronic systolic heart failure EF 30%, who lives at home and has been feeling weak for the last 3-4 weeks, also having persistent falls found to have UTI  1. Persistent falls with leukocytosis with UTI; neuro exam no focal; recurrent UTI;  -cont IV atx, blood cultures NGTD; urine E coli; changed atx to imipenem   2. Sinus node dysfunction, underlying PAF s/p PPM; episode of brady -per Dr. Jens Som ? Need to replace PPM    3. Diabetes mellitus type 2. Recent A1c was 8.7 -hypoglycemic episode due to glipizide on CKD: resolved on dextrose; hold glipizide; will resume ISS:  4. Lytic lesions noted on CT scan head last admission and again this admission, SPEP is abnormal  -consulted hematology evaluation; ? Amyloidosis with multiple organ involvement   5. Chronic systolic CHF LVEF 23% on 03/3613-->43 % (03/2014), dilated cardiomyopathy, pulmonary HTN, Dyskinesis of the basal-midanteroseptal myocardium -cardiology following; ? Amilidosis; ? Stress test need; not a good candidate for LHC with CKD;  -congestion on chest x-ray; but no respiratory distress;  -started lasix prn; monitor; I/O; daily weight; labs;   Code Status: full Family Communication:  D/w patient, husband at the bedside (indicate person spoken with, relationship, and if by phone, the number) Disposition Plan: SNF 24-48 hours, pend clinical improvement    Consultants:  EP  Procedures:  none  Antibiotics:  Ceftriaxone 5/12   Zosyn 5/12-5/14  Imipenem-cil 5/14<<<<<  (indicate start date, and stop date if  known)  HPI/Subjective: alert  Objective: Filed Vitals:   03/18/14 0613  BP: 104/60  Pulse: 85  Temp: 98.1 F (36.7 C)  Resp: 18    Intake/Output Summary (Last 24 hours) at 03/18/14 1008 Last data filed at 03/17/14 1700  Gross per 24 hour  Intake    360 ml  Output      0 ml  Net    360 ml   Filed Weights   03/16/14 0517 03/17/14 0608 03/18/14 0613  Weight: 50.7 kg (111 lb 12.4 oz) 51.3 kg (113 lb 1.5 oz) 49.624 kg (109 lb 6.4 oz)    Exam:   General:  alert  Cardiovascular: s1,s2 rrr  Respiratory: CTA BL  Abdomen: s1,2 rr  Musculoskeletal: no LE edema   Data Reviewed: Basic Metabolic Panel:  Recent Labs Lab 03/12/14 0600 03/15/14 1228 03/16/14 0400  NA 141 137 140  K 4.4 4.9 4.9  CL 110 106 109  CO2 16* 17* 18*  GLUCOSE 67* 197* 137*  BUN 63* 59* 55*  CREATININE 1.78* 1.74* 1.73*  CALCIUM 8.3* 8.6 8.5  MG  --   --  1.8   Liver Function Tests:  Recent Labs Lab 03/15/14 1228  AST 20  ALT 30  ALKPHOS 103  BILITOT 0.7  PROT 5.4*  ALBUMIN 2.8*   No results found for this basename: LIPASE, AMYLASE,  in the last 168 hours No results found for this basename: AMMONIA,  in the last 168 hours CBC:  Recent Labs Lab 03/12/14 0600 03/15/14 1228 03/16/14 0400  WBC 10.0 20.8* 11.8*  NEUTROABS 8.5* 19.9*  --   HGB 10.7* 11.0* 9.9*  HCT 33.5* 33.3*  30.5*  MCV 98.2 94.9 95.0  PLT 237 204 188   Cardiac Enzymes:  Recent Labs Lab 03/15/14 1228  TROPONINI <0.30   BNP (last 3 results)  Recent Labs  12/06/13 1129  PROBNP 200.0*   CBG:  Recent Labs Lab 03/17/14 1752 03/17/14 2002 03/18/14 0033 03/18/14 0615 03/18/14 0735  GLUCAP 290* 234* 240* 196* 152*    Recent Results (from the past 240 hour(s))  URINE CULTURE     Status: None   Collection Time    03/09/14 11:48 AM      Result Value Ref Range Status   Specimen Description URINE, CATHETERIZED   Final   Special Requests NONE   Final   Culture  Setup Time     Final   Value:  03/09/2014 17:47     Performed at [place]     Final   Value: NO GROWTH     Performed at [facility]   Culture     Final   Value: NO GROWTH     Performed at [facility]   Report Status 03/10/2014 FINAL   Final  URINE CULTURE     Status: None   Collection Time    03/10/14  3:10 PM      Result Value Ref Range Status   Specimen Description URINE, RANDOM   Final   Special Requests NONE   Final   Culture  Setup Time     Final   Value: 03/10/2014 18:40     Performed at [place]     Final   Value: >=100,000 COLONIES/ML     Performed at [facility]   Culture     Final   Value: Multiple bacterial morphotypes present, none predominant. Suggest appropriate recollection if clinically indicated.     Performed at [facility]   Report Status 03/11/2014 FINAL   Final  URINE CULTURE     Status: None   Collection Time    03/15/14  2:58 PM      Result Value Ref Range Status   Specimen Description URINE, CLEAN CATCH   Final   Special Requests NONE   Final   Culture  Setup Time     Final   Value: 03/15/2014 15:43     Performed at [place]     Final   Value: >=100,000 COLONIES/ML     Performed at [facility]   Culture     Final   Value: ESCHERICHIA COLI     Note: Confirmed Extended Spectrum Beta-Lactamase Producer (ESBL) CRITICAL RESULT CALLED TO, READ BACK BY AND VERIFIED WITH: EMILY M. AT 12:45PM ON 03/17/2014 HAJAM     Performed at [facility]   Report Status 03/17/2014 FINAL   Final   Organism ID, Bacteria ESCHERICHIA COLI   Final  CULTURE, BLOOD (ROUTINE X 2)     Status: None   Collection Time    03/15/14  4:50 PM      Result Value Ref Range Status   Specimen Description BLOOD ARM RIGHT   Final   Special Requests BOTTLES DRAWN AEROBIC AND ANAEROBIC 10CC   Final   Culture  Setup Time     Final   Value: 03/15/2014 21:42     Performed at [facility]   Culture     Final   Value:  BLOOD CULTURE RECEIVED NO GROWTH TO DATE CULTURE WILL BE HELD FOR 5 DAYS BEFORE ISSUING A FINAL NEGATIVE REPORT     Performed at [facility]   Report Status PENDING   Incomplete  CULTURE, BLOOD (ROUTINE X 2)     Status: None   Collection Time    03/15/14  5:00 PM      Result Value Ref Range Status   Specimen Description BLOOD ARM RIGHT   Final   Special Requests BOTTLES DRAWN AEROBIC ONLY 10CC   Final   Culture  Setup Time     Final   Value: 03/15/2014 21:43     Performed at [facility]   Culture     Final   Value:        BLOOD CULTURE RECEIVED NO GROWTH TO DATE CULTURE WILL BE HELD FOR 5 DAYS BEFORE ISSUING A FINAL NEGATIVE REPORT     Performed at [facility]   Report Status PENDING   Incomplete     Studies: US Renal  03/17/2014   CLINICAL DATA:  Acute kidney injury. Recurring urinary tract infections. History of diabetes.  EXAM: RENAL/URINARY TRACT ULTRASOUND COMPLETE  COMPARISON:  Abdominal CT 01/23/2012.  Renal ultrasound 01/08/2012.  FINDINGS: Right Kidney:  Length: 9.3 cm. There is progressive cortical thinning without focal cortical lesion or hydronephrosis. Mild perinephric fluid is present.  Left Kidney:  Length: 9.4 cm. There is progressive cortical thinning. There is a mildly septated cyst in the lower pole which measures 4.2 x 2.7 x 3.3 cm. No hydronephrosis. Moderate size left pleural effusion noted incidentally.  Bladder:  Appears normal for degree of bladder distention.  IMPRESSION: Under  1. Progressive bilateral renal cortical thinning consistent with chronic medical renal disease. 2. No evidence of hydronephrosis. 3. Mildly septated cyst in the lower pole of the left kidney.   Electronically Signed   By: Camie Patience M.D.   On: 03/17/2014 19:19    Scheduled Meds: . ALPRAZolam  0.25 mg Oral BID  . B-complex with vitamin C  1 tablet Oral Daily  . bacitracin-polymyxin b  1 application Topical BID  .  dabigatran  75 mg Oral BID  . imipenem-cilastatin  250 mg Intravenous Q12H  . metoprolol  50 mg Oral BID  . pantoprazole  40 mg Oral Daily  . sodium chloride  3 mL Intravenous Q12H   Continuous Infusions: . dextrose      Active Problems:   CKD (chronic kidney disease), stage III   UTI (lower urinary tract infection)   Pacemaker   Tachy-brady syndrome   PAF (paroxysmal atrial fibrillation)   General weakness   Type II or unspecified type diabetes mellitus without mention of complication, not stated as uncontrolled   Syncope    Time spent: >35 minutes     Kinnie Feil  [facility] Pager [phone]. If 7PM-7AM, please contact night-coverage at [email or web address], password [facility] 03/18/2014, 10:08 AM  LOS: 3 days

## 2014-03-18 NOTE — Progress Notes (Signed)
Clinical Social Work Department BRIEF PSYCHOSOCIAL ASSESSMENT 03/18/2014  Patient:  RAMESHA, POSTER     Account Number:  [id number]     Admit date:  03/15/2014  Clinical Social Worker:  Adair Laundry  Date/Time:  03/18/2014 04:00 PM  Referred by:  Physician  Date Referred:  03/18/2014 Referred for  SNF Placement   Other Referral:   Interview type:  Patient Other interview type:    PSYCHOSOCIAL DATA Living Status:  HUSBAND Admitted from facility:   Level of care:   Primary support name:  Sharyl Panchal [phone] Primary support relationship to patient:  SPOUSE Degree of support available:   Pt has good support available    CURRENT CONCERNS Current Concerns  Post-Acute Placement   Other Concerns:    SOCIAL WORK ASSESSMENT / PLAN CSW aware of PT recommendation for SNF. CSW visited pt room and spoke with pt about recommendation. Pt already aware and agreeable to ST rehab at [facility]. Pt informed CSW she has been to [facility] twice before and her husband has already spokewn with them about her returning. Pt is agreeable to also being faxed out to other [facility] is necessary. Pt has no concerns about dc to SNF because she informed CSW she was comfortable there in the past.    CSW spoke with [facility] admissions and they confirmed they will be able to accept pt. CSW informed of possible Monday discharge.   Assessment/plan status:  Psychosocial Support/Ongoing Assessment of Needs Other assessment/ plan:   Information/referral to community resources:   None needed    PATIENT'S/FAMILY'S RESPONSE TO PLAN OF CARE: Pt is agreeable to SNF       [place], [place]

## 2014-03-18 NOTE — Progress Notes (Addendum)
Inpatient Diabetes Program Recommendations  AACE/ADA: New Consensus Statement on Inpatient Glycemic Control (2013)  Target Ranges:  Prepandial:   less than 140 mg/dL      Peak postprandial:   less than 180 mg/dL (1-2 hours)      Critically ill patients:  140 - 180 mg/dL      Results for HANADI, STANLY (MRN [id number]) as of 03/18/2014 07:35  Ref. Range 03/17/2014 07:53 03/17/2014 08:53 03/17/2014 11:58 03/17/2014 13:01 03/17/2014 13:41 03/17/2014 14:45 03/17/2014 15:50 03/17/2014 16:32 03/17/2014 17:52 03/17/2014 20:02  Glucose-Capillary Latest Range: 70-99 mg/dL 52 (L) 84 59 (L) 26 (LL) 178 (H) 237 (H) 288 (H) 277 (H) 290 (H) 234 (H)    Results for GUILLERMO, DIFRANCESCO (MRN [id number]) as of 03/18/2014 07:35  Ref. Range 03/18/2014 00:33 03/18/2014 06:15  Glucose-Capillary Latest Range: 70-99 mg/dL 240 (H) 196 (H)     Patient with hypoglycemia yesterday.  Glipizide discontinued as a result.  Last dose of Glipizide was given at 8am on 05/13.  Novolog SSI was also discontinued.  Patient now with hyperglycemia.  Note that D10% drip switched to D5 IVF.   MD- Please restart Novolog Sensitive SSI tid ac + HS if patient continues to have hyperglycemia    Will follow Wyn Quaker RN, MSN, CDE Diabetes Coordinator Inpatient Diabetes Program Team Pager: [phone] (8a-10p)

## 2014-03-19 DIAGNOSIS — E119 Type 2 diabetes mellitus without complications: Secondary | ICD-10-CM

## 2014-03-19 LAB — BASIC METABOLIC PANEL
BUN: 44 mg/dL — ABNORMAL HIGH (ref 6–23)
CHLORIDE: 106 meq/L (ref 96–112)
CO2: 21 mEq/L (ref 19–32)
Calcium: 8.8 mg/dL (ref 8.4–10.5)
Creatinine, Ser: 1.71 mg/dL — ABNORMAL HIGH (ref 0.50–1.10)
GFR calc Af Amer: 30 mL/min — ABNORMAL LOW (ref >90)
GFR calc non Af Amer: 26 mL/min — ABNORMAL LOW (ref >90)
Glucose, Bld: 96 mg/dL (ref 70–99)
Potassium: 5.3 mEq/L (ref 3.7–5.3)
SODIUM: 139 meq/L (ref 137–147)

## 2014-03-19 LAB — GLUCOSE, CAPILLARY
GLUCOSE-CAPILLARY: 141 mg/dL — AB (ref 70–99)
GLUCOSE-CAPILLARY: 118 mg/dL — AB (ref 70–99)
Glucose-Capillary: 100 mg/dL — ABNORMAL HIGH (ref 70–99)
Glucose-Capillary: 131 mg/dL — ABNORMAL HIGH (ref 70–99)
Glucose-Capillary: 95 mg/dL (ref 70–99)
Glucose-Capillary: 99 mg/dL (ref 70–99)

## 2014-03-19 MED ORDER — FUROSEMIDE 40 MG PO TABS
40.0000 mg | ORAL_TABLET | Freq: Every day | ORAL | Status: DC
  Administered 2014-03-19: 40 mg via ORAL
  Filled 2014-03-19 (×2): qty 1

## 2014-03-19 NOTE — Consult Note (Signed)
Reason for Consult: Markedly depressed LV systolic function Referring Physician: [facility]  Sophia Oliver is an 80 y.o. female.  HPI: Patient is 80 year old female with past medical history significant for multiple medical problems i.e. coronary artery disease questionable history of anteroseptal wall myocardial infarction in the past, hypertension, history of tachybradycardia syndrome status post permanent pacemaker history of atrial tachycardia arrhythmias and atrial fibrillation sinus node dysfunction, chronic kidney disease stage III, history of CA of breast, degenerative joint disease, osteoporosis,  history of syncope in the past, was admitted because of persistent recurrent falls associated with feeling weak tired and chills and was found to have UTI. Cardiologic consultation is called as patient had 2-D echo done which showed markedly depressed LV systolic function with EF of 15-20% and disc any C. of basal and mid anteroseptal wall and also moderately severe mitral regurgitation and tricuspid regurgitation. Patient denies any chest pain nausea vomiting diaphoresis although activity is very limited due to marked osteoporosis and frailty. Patient states she feels overall better since admission.  Past Medical History  Diagnosis Date  . Syncope   . Sinus node dysfunction     a. s/p pacemaker.  . Wide-complex tachycardia   . Cholelithiasis   . Glucose intolerance (impaired glucose tolerance)   . Anxiety   . PONV (postoperative nausea and vomiting)   . Chronic kidney disease     a. Baseline Cr reported 1.8-2.0.  . Injury of right upper arm     after venipuncture in 3/13 - pt describes pain afterwards- no followup done seen by PCP- no further treatment   . PAF (paroxysmal atrial fibrillation)   . Left bundle branch block   . Tachycardia-bradycardia syndrome   . CHF (congestive heart failure)     a. EF 30-35% by echo 2015.  [name] Pacemaker     Guidant, implanted 2006  . Type II  diabetes mellitus     "recently dx'd" (03/09/2014)  . Breast cancer 2003    "radiation on right; mastectomy on the left"  . Hypertension   . GERD (gastroesophageal reflux disease)   . DJD (degenerative joint disease)   . Arthritis     "real bad in my lower back" (03/09/2014)  . Chronic lower back pain   . Orthostatic hypotension     a. Diuretics d/c'd 03/2014.  [name] PAT (paroxysmal atrial tachycardia)     Past Surgical History  Procedure Laterality Date  . Replacement total knee bilateral Bilateral   . Total shoulder replacement    . Tonsillectomy    . Orif distal femur fracture Right 12/2007  . Tonsillectomy    . Mastectomy Left 2003  . Total hip arthroplasty  06/16/2012    Procedure: TOTAL HIP ARTHROPLASTY;  Surgeon: Mauri Pole, MD;  Location: [facility];  Service: Orthopedics;  Laterality: Left;  . Hip closed reduction  06/30/2012    Procedure: CLOSED MANIPULATION HIP;  Surgeon: Mauri Pole, MD;  Location: [facility];  Service: Orthopedics;  Laterality: Left;  . Abdominal hysterectomy    . Pacemaker insertion      Guidant Insignia I  . Cataract extraction w/ intraocular lens  implant, bilateral Bilateral 2000's  . Refractive surgery Bilateral 01/2014  . Inguinal hernia repair Left     "had emergency OR for it a few years ago" (03/09/2014)  . Joint replacement    . Breast biopsy Bilateral     Family History  Problem Relation Age of Onset  . Hypertension  Social History:  reports that she has quit smoking. Her smoking use included Cigarettes. She has a 3 pack-year smoking history. She has never used smokeless tobacco. She reports that she drinks alcohol. She reports that she does not use illicit drugs.  Allergies:  Allergies  Allergen Reactions  . Codeine Nausea And Vomiting  . Other Nausea And Vomiting    Narcotics  dizziness    Medications: I have reviewed the patient's current medications.  Results for orders placed during the hospital encounter of 03/15/14 (from the  past 48 hour(s))  GLUCOSE, CAPILLARY     Status: Abnormal   Collection Time    03/17/14  2:45 PM      Result Value Ref Range   Glucose-Capillary 237 (*) 70 - 99 mg/dL  GLUCOSE, CAPILLARY     Status: Abnormal   Collection Time    03/17/14  3:50 PM      Result Value Ref Range   Glucose-Capillary 288 (*) 70 - 99 mg/dL  GLUCOSE, CAPILLARY     Status: Abnormal   Collection Time    03/17/14  4:32 PM      Result Value Ref Range   Glucose-Capillary 277 (*) 70 - 99 mg/dL  GLUCOSE, CAPILLARY     Status: Abnormal   Collection Time    03/17/14  5:52 PM      Result Value Ref Range   Glucose-Capillary 290 (*) 70 - 99 mg/dL  GLUCOSE, CAPILLARY     Status: Abnormal   Collection Time    03/17/14  8:02 PM      Result Value Ref Range   Glucose-Capillary 234 (*) 70 - 99 mg/dL   Comment 1 Notify RN    GLUCOSE, CAPILLARY     Status: Abnormal   Collection Time    03/18/14 12:33 AM      Result Value Ref Range   Glucose-Capillary 240 (*) 70 - 99 mg/dL   Comment 1 Notify RN    GLUCOSE, CAPILLARY     Status: Abnormal   Collection Time    03/18/14  6:15 AM      Result Value Ref Range   Glucose-Capillary 196 (*) 70 - 99 mg/dL   Comment 1 Notify RN    GLUCOSE, CAPILLARY     Status: Abnormal   Collection Time    03/18/14  7:35 AM      Result Value Ref Range   Glucose-Capillary 152 (*) 70 - 99 mg/dL   Comment 1 Documented in Chart     Comment 2 Notify RN    GLUCOSE, CAPILLARY     Status: Abnormal   Collection Time    03/18/14 11:44 AM      Result Value Ref Range   Glucose-Capillary 133 (*) 70 - 99 mg/dL   Comment 1 Documented in Chart     Comment 2 Notify RN    TECHNOLOGIST SMEAR REVIEW     Status: None   Collection Time    03/18/14  1:25 PM      Result Value Ref Range   Tech Review ELLIPTOCYTES     Comment: BURR CELLS     TEARDROP CELLS     POLYCHROMASIA PRESENT     ACANTHOCYTES     NO ROULEAUX NOTED.  GLUCOSE, CAPILLARY     Status: Abnormal   Collection Time    03/18/14  4:47 PM       Result Value Ref Range   Glucose-Capillary 110 (*) 70 - 99 mg/dL  Comment 1 Documented in Chart     Comment 2 Notify RN    GLUCOSE, CAPILLARY     Status: Abnormal   Collection Time    03/18/14  9:02 PM      Result Value Ref Range   Glucose-Capillary 107 (*) 70 - 99 mg/dL   Comment 1 Notify RN    GLUCOSE, CAPILLARY     Status: Abnormal   Collection Time    03/19/14 12:10 AM      Result Value Ref Range   Glucose-Capillary 131 (*) 70 - 99 mg/dL   Comment 1 Notify RN    BASIC METABOLIC PANEL     Status: Abnormal   Collection Time    03/19/14  4:20 AM      Result Value Ref Range   Sodium 139  137 - 147 mEq/L   Potassium 5.3  3.7 - 5.3 mEq/L   Chloride 106  96 - 112 mEq/L   CO2 21  19 - 32 mEq/L   Glucose, Bld 96  70 - 99 mg/dL   BUN 44 (*) 6 - 23 mg/dL   Creatinine, Ser 1.71 (*) 0.50 - 1.10 mg/dL   Calcium 8.8  8.4 - 10.5 mg/dL   GFR calc non Af Amer 26 (*) >90 mL/min   GFR calc Af Amer 30 (*) >90 mL/min   Comment: (NOTE)     The eGFR has been calculated using the CKD EPI equation.     This calculation has not been validated in all clinical situations.     eGFR's persistently <90 mL/min signify possible Chronic Kidney     Disease.  GLUCOSE, CAPILLARY     Status: None   Collection Time    03/19/14  6:30 AM      Result Value Ref Range   Glucose-Capillary 95  70 - 99 mg/dL  GLUCOSE, CAPILLARY     Status: None   Collection Time    03/19/14  7:41 AM      Result Value Ref Range   Glucose-Capillary 99  70 - 99 mg/dL  GLUCOSE, CAPILLARY     Status: Abnormal   Collection Time    03/19/14 11:44 AM      Result Value Ref Range   Glucose-Capillary 100 (*) 70 - 99 mg/dL    US Renal  03/17/2014   CLINICAL DATA:  Acute kidney injury. Recurring urinary tract infections. History of diabetes.  EXAM: RENAL/URINARY TRACT ULTRASOUND COMPLETE  COMPARISON:  Abdominal CT 01/23/2012.  Renal ultrasound 01/08/2012.  FINDINGS: Right Kidney:  Length: 9.3 cm. There is progressive cortical  thinning without focal cortical lesion or hydronephrosis. Mild perinephric fluid is present.  Left Kidney:  Length: 9.4 cm. There is progressive cortical thinning. There is a mildly septated cyst in the lower pole which measures 4.2 x 2.7 x 3.3 cm. No hydronephrosis. Moderate size left pleural effusion noted incidentally.  Bladder:  Appears normal for degree of bladder distention.  IMPRESSION: Under  1. Progressive bilateral renal cortical thinning consistent with chronic medical renal disease. 2. No evidence of hydronephrosis. 3. Mildly septated cyst in the lower pole of the left kidney.   Electronically Signed   By: Camie Patience M.D.   On: 03/17/2014 19:19    Review of Systems  Constitutional: Positive for chills and malaise/fatigue.  Eyes: Negative for double vision, photophobia and pain.  Respiratory: Negative for cough, hemoptysis and sputum production.   Cardiovascular: Negative for chest pain, palpitations, orthopnea, claudication and leg swelling.  Gastrointestinal:  Negative for nausea, vomiting and abdominal pain.  Neurological: Negative for dizziness.   Blood pressure 98/69, pulse 88, temperature 98 F (36.7 C), temperature source Oral, resp. rate 15, height '5\' 1"'  (1.549 m), weight 51 kg (112 lb 7 oz), SpO2 100.00%. Physical Exam  Constitutional: She is oriented to person, place, and time.  HENT:  Head: Normocephalic and atraumatic.  Eyes: Conjunctivae are normal. Left eye exhibits no discharge. No scleral icterus.  Neck: Neck supple. No JVD present. No tracheal deviation present. No thyromegaly present.  Cardiovascular: Normal rate and regular rhythm.   Murmur (Soft systolic murmur noted) heard. Respiratory: Effort normal and breath sounds normal. No respiratory distress. She has no wheezes. She has no rales.  GI: Soft. Bowel sounds are normal. She exhibits no distension. There is no tenderness.  Musculoskeletal: She exhibits no edema.  Neurological: She is alert and oriented to  person, place, and time.    Assessment/Plan: Resolving Escherichia coli UTI Coronary artery disease history of questionable anteroseptal wall myocardial infarction in the past Dilated cardiomyopathy multifactorial i.e. tachycardia induced/ valvular heart disease/questionable ischemic heart disease Compensated systolic/diastolic heart failure Valvular heart disease History of tachybradycardia syndrome/history of atrial fibrillation and atrial tachyarrhythmias Chronic kidney disease stage III History of syncope History of CA of breast in the past Lytic bony lesions of questionable significance GERD Degenerative joint disease with osteoporosis Malnutrition Plan Reviewed old records from [facility] patient had Adenosine MRI in 2005 which was negative for ischemia with normal EF and also Persantine Myoview in September of 2005 which showed fixed defect in the anterior With EF of 62%. Her EF has progressively declined probably due to recurrent episodes of tachyarrhythmias and progressive worsening of valvular heart disease. Although ischemia could not be completely excluded. Patient may benefit from CRT. Discussed with patient at length regarding various options of treatment i.e. medical versus doing nuclear stress test to evaluate for ischemia and if there is a large area of ischemia consider cardiac cath and staging for PCI. Patient agrees for nuclear stress test. Clent Demark 03/19/2014, 2:07 PM

## 2014-03-19 NOTE — Progress Notes (Signed)
[facility] PROGRESS NOTE  Sophia Oliver NTI:[id number] DOB: Jul 31, 1924 DOA: 03/15/2014 PCP: [name], MD  Assessment/Plan: 80 y.o. female, Sophia Oliver is a 80 y.o. female, history of sinus node dysfunction status post pacemaker placement, atrial fibrillation on amiodarone along with Pradaxa all of which [place] EP, breast cancer, DJD, chronic kidney disease stage III baseline creatinine between 1.2-1.5, anxiety, chronic systolic heart failure EF 30%, who lives at home and has been feeling weak for the last 3-4 weeks, also having persistent falls found to have UTI  1. Persistent falls with leukocytosis with UTI; neuro exam no focal; recurrent UTI;  -improving on IV atx, blood cultures NGTD; urine E coli; changed atx to imipenem   -no new episodes of fall; need SNF 2. Sinus node dysfunction, underlying PAF s/p PPM; episode of brady -per Dr. Jens Som ? Need to replace PPM; cs/ Dr. Sarina Ser   3. Diabetes mellitus type 2. Recent A1c was 8.7 -hypoglycemic episode due to glipizide on CKD: resolved on dextrose; hold glipizide; will resume ISS:  4. Lytic lesions noted on CT scan head last admission and again this admission -per hematology evaluation unlikely MM, since polyclonal on protein electrophoresis; recommended OP f/u  5. Chronic systolic CHF LVEF 86% on 05/6194-->09 % (03/2014), dilated cardiomyopathy, pulmonary HTN, Dyskinesis of the basal-midanteroseptal myocardium -+consulted Dr. Sarina Ser ? Stress test need; not a good candidate for LHC with CKD;  -congestion on chest x-ray; but no respiratory distress; started lasix; monitor; I/O; daily weight; labs;   Code Status: full Family Communication:  D/w patient, husband at the bedside (indicate person spoken with, relationship, and if by phone, the number) Disposition Plan: SNF 24-48 hours, pend clinical improvement    Consultants:  EP  Procedures:  none  Antibiotics:  Ceftriaxone 5/12   Zosyn 5/12-5/14  Imipenem-cil  5/14<<<<<  (indicate start date, and stop date if known)  HPI/Subjective: alert  Objective: Filed Vitals:   03/19/14 0625  BP: 97/69  Pulse: 88  Temp: 98 F (36.7 C)  Resp: 18    Intake/Output Summary (Last 24 hours) at 03/19/14 1157 Last data filed at 03/18/14 1500  Gross per 24 hour  Intake    240 ml  Output      0 ml  Net    240 ml   Filed Weights   03/17/14 0608 03/18/14 0613 03/19/14 0625  Weight: 51.3 kg (113 lb 1.5 oz) 49.624 kg (109 lb 6.4 oz) 51 kg (112 lb 7 oz)    Exam:   General:  alert  Cardiovascular: s1,s2 rrr  Respiratory: CTA BL  Abdomen: s1,2 rr  Musculoskeletal: no LE edema   Data Reviewed: Basic Metabolic Panel:  Recent Labs Lab 03/15/14 1228 03/16/14 0400 03/19/14 0420  NA 137 140 139  K 4.9 4.9 5.3  CL 106 109 106  CO2 17* 18* 21  GLUCOSE 197* 137* 96  BUN 59* 55* 44*  CREATININE 1.74* 1.73* 1.71*  CALCIUM 8.6 8.5 8.8  MG  --  1.8  --    Liver Function Tests:  Recent Labs Lab 03/15/14 1228  AST 20  ALT 30  ALKPHOS 103  BILITOT 0.7  PROT 5.4*  ALBUMIN 2.8*   No results found for this basename: LIPASE, AMYLASE,  in the last 168 hours No results found for this basename: AMMONIA,  in the last 168 hours CBC:  Recent Labs Lab 03/15/14 1228 03/16/14 0400  WBC 20.8* 11.8*  NEUTROABS 19.9*  --   HGB 11.0* 9.9*  HCT 33.3*  30.5*  MCV 94.9 95.0  PLT 204 188   Cardiac Enzymes:  Recent Labs Lab 03/15/14 1228  TROPONINI <0.30   BNP (last 3 results)  Recent Labs  12/06/13 1129  PROBNP 200.0*   CBG:  Recent Labs Lab 03/18/14 2102 03/19/14 0010 03/19/14 0630 03/19/14 0741 03/19/14 1144  GLUCAP 107* 131* 95 99 100*    Recent Results (from the past 240 hour(s))  URINE CULTURE     Status: None   Collection Time    03/10/14  3:10 PM      Result Value Ref Range Status   Specimen Description URINE, RANDOM   Final   Special Requests NONE   Final   Culture  Setup Time     Final   Value: 03/10/2014  18:40     Performed at [facility]     Final   Value: >=100,000 COLONIES/ML     Performed at [facility]   Culture     Final   Value: Multiple bacterial morphotypes present, none predominant. Suggest appropriate recollection if clinically indicated.     Performed at [facility]   Report Status 03/11/2014 FINAL   Final  URINE CULTURE     Status: None   Collection Time    03/15/14  2:58 PM      Result Value Ref Range Status   Specimen Description URINE, CLEAN CATCH   Final   Special Requests NONE   Final   Culture  Setup Time     Final   Value: 03/15/2014 15:43     Performed at [facility]     Final   Value: >=100,000 COLONIES/ML     Performed at [facility]   Culture     Final   Value: ESCHERICHIA COLI     Note: Confirmed Extended Spectrum Beta-Lactamase Producer (ESBL) CRITICAL RESULT CALLED TO, READ BACK BY AND VERIFIED WITH: EMILY M. AT 12:45PM ON 03/17/2014 HAJAM     Performed at [facility]   Report Status 03/17/2014 FINAL   Final   Organism ID, Bacteria ESCHERICHIA COLI   Final  CULTURE, BLOOD (ROUTINE X 2)     Status: None   Collection Time    03/15/14  4:50 PM      Result Value Ref Range Status   Specimen Description BLOOD ARM RIGHT   Final   Special Requests BOTTLES DRAWN AEROBIC AND ANAEROBIC 10CC   Final   Culture  Setup Time     Final   Value: 03/15/2014 21:42     Performed at [facility]   Culture     Final   Value:        BLOOD CULTURE RECEIVED NO GROWTH TO DATE CULTURE WILL BE HELD FOR 5 DAYS BEFORE ISSUING A FINAL NEGATIVE REPORT     Performed at [facility]   Report Status PENDING   Incomplete  CULTURE, BLOOD (ROUTINE X 2)     Status: None   Collection Time    03/15/14  5:00 PM      Result Value Ref Range Status   Specimen Description BLOOD ARM RIGHT   Final   Special Requests BOTTLES DRAWN AEROBIC ONLY 10CC   Final   Culture  Setup Time     Final    Value: 03/15/2014 21:43     Performed at [facility]   Culture     Final  Value:        BLOOD CULTURE RECEIVED NO GROWTH TO DATE CULTURE WILL BE HELD FOR 5 DAYS BEFORE ISSUING A FINAL NEGATIVE REPORT     Performed at [facility]   Report Status PENDING   Incomplete     Studies: US Renal  03/17/2014   CLINICAL DATA:  Acute kidney injury. Recurring urinary tract infections. History of diabetes.  EXAM: RENAL/URINARY TRACT ULTRASOUND COMPLETE  COMPARISON:  Abdominal CT 01/23/2012.  Renal ultrasound 01/08/2012.  FINDINGS: Right Kidney:  Length: 9.3 cm. There is progressive cortical thinning without focal cortical lesion or hydronephrosis. Mild perinephric fluid is present.  Left Kidney:  Length: 9.4 cm. There is progressive cortical thinning. There is a mildly septated cyst in the lower pole which measures 4.2 x 2.7 x 3.3 cm. No hydronephrosis. Moderate size left pleural effusion noted incidentally.  Bladder:  Appears normal for degree of bladder distention.  IMPRESSION: Under  1. Progressive bilateral renal cortical thinning consistent with chronic medical renal disease. 2. No evidence of hydronephrosis. 3. Mildly septated cyst in the lower pole of the left kidney.   Electronically Signed   By: Camie Patience M.D.   On: 03/17/2014 19:19    Scheduled Meds: . ALPRAZolam  0.25 mg Oral BID  . B-complex with vitamin C  1 tablet Oral Daily  . bacitracin-polymyxin b  1 application Topical BID  . dabigatran  75 mg Oral BID  . imipenem-cilastatin  250 mg Intravenous Q12H  . insulin aspart  0-9 Units Subcutaneous TID WC  . metoprolol  50 mg Oral BID  . pantoprazole  40 mg Oral Daily  . sodium chloride  3 mL Intravenous Q12H   Continuous Infusions:    Active Problems:   CKD (chronic kidney disease), stage III   UTI (lower urinary tract infection)   Pacemaker   Tachy-brady syndrome   PAF (paroxysmal atrial fibrillation)   General weakness   Type II or unspecified type diabetes  mellitus without mention of complication, not stated as uncontrolled   Syncope    Time spent: >35 minutes     Kinnie Feil  [facility] Pager [phone]. If 7PM-7AM, please contact night-coverage at [email or web address], password [facility] 03/19/2014, 11:57 AM  LOS: 4 days

## 2014-03-20 LAB — BASIC METABOLIC PANEL
BUN: 48 mg/dL — ABNORMAL HIGH (ref 6–23)
CHLORIDE: 103 meq/L (ref 96–112)
CO2: 22 mEq/L (ref 19–32)
Calcium: 8.6 mg/dL (ref 8.4–10.5)
Creatinine, Ser: 1.81 mg/dL — ABNORMAL HIGH (ref 0.50–1.10)
GFR calc Af Amer: 28 mL/min — ABNORMAL LOW (ref >90)
GFR calc non Af Amer: 24 mL/min — ABNORMAL LOW (ref >90)
Glucose, Bld: 234 mg/dL — ABNORMAL HIGH (ref 70–99)
POTASSIUM: 4.8 meq/L (ref 3.7–5.3)
Sodium: 138 mEq/L (ref 137–147)

## 2014-03-20 LAB — GLUCOSE, CAPILLARY
GLUCOSE-CAPILLARY: 163 mg/dL — AB (ref 70–99)
GLUCOSE-CAPILLARY: 129 mg/dL — AB (ref 70–99)
Glucose-Capillary: 164 mg/dL — ABNORMAL HIGH (ref 70–99)
Glucose-Capillary: 95 mg/dL (ref 70–99)

## 2014-03-20 MED ORDER — FUROSEMIDE 20 MG PO TABS
20.0000 mg | ORAL_TABLET | Freq: Every day | ORAL | Status: DC
  Administered 2014-03-20 – 2014-03-22 (×3): 20 mg via ORAL
  Filled 2014-03-20 (×3): qty 1

## 2014-03-20 NOTE — Progress Notes (Signed)
[facility] PROGRESS NOTE  Sophia Oliver WFU:[id number] DOB: 01-Sep-1924 DOA: 03/15/2014 PCP: [name], MD  Assessment/Plan: 80 y.o. female, Sophia Oliver is a 80 y.o. female, history of sinus node dysfunction status post pacemaker placement, atrial fibrillation on amiodarone along with Pradaxa all of which [place] EP, breast cancer, DJD, chronic kidney disease stage III baseline creatinine between 1.2-1.5, anxiety, chronic systolic heart failure EF 30%, who lives at home and has been feeling weak for the last 3-4 weeks, also having persistent falls found to have UTI  1. Persistent falls with leukocytosis with UTI; neuro exam no focal; recurrent UTI;  -improving on IV atx, blood cultures NGTD; urine E coli; changed atx to imipenem   -no new episodes of fall; need SNF 2. Sinus node dysfunction, underlying PAF s/p PPM; episode of brady -per Dr. Jens Som ? Need to replace PPM; defer to EP  3. Diabetes mellitus type 2. Recent A1c was 8.7 -hypoglycemic episode due to glipizide on CKD: resolved on dextrose; hold glipizide; will resume ISS:  4. Lytic lesions noted on CT scan head last admission and again this admission -per hematology evaluation unlikely MM, since polyclonal on protein electrophoresis; recommended OP f/u  5. Chronic systolic CHF LVEF 20% on 12/5425-->06 % (03/2014), dilated cardiomyopathy, pulmonary HTN, Dyskinesis of the basal-midanteroseptal myocardium appreciate  Dr. Sarina Ser input; pend stress test need; may not be a good candidate for LHC with CKD;  -on prn lasix improved; monitor; I/O likely incorrect; daily weight;    Code Status: full Family Communication:  D/w patient, husband at the bedside (indicate person spoken with, relationship, and if by phone, the number) Disposition Plan: SNF 24-48 hours, pend clinical improvement    Consultants:  EP  Procedures:  none  Antibiotics:  Ceftriaxone 5/12   Zosyn 5/12-5/14  Imipenem-cil 5/14<<<<<  (indicate start date,  and stop date if known)  HPI/Subjective: alert  Objective: Filed Vitals:   03/20/14 0553  BP: 108/65  Pulse: 80  Temp: 98.1 F (36.7 C)  Resp: 18    Intake/Output Summary (Last 24 hours) at 03/20/14 0916 Last data filed at 03/19/14 1652  Gross per 24 hour  Intake    100 ml  Output      0 ml  Net    100 ml   Filed Weights   03/18/14 0613 03/19/14 0625 03/20/14 0553  Weight: 49.624 kg (109 lb 6.4 oz) 51 kg (112 lb 7 oz) 50.6 kg (111 lb 8.8 oz)    Exam:   General:  alert  Cardiovascular: s1,s2 rrr  Respiratory: CTA BL  Abdomen: s1,2 rr  Musculoskeletal: no LE edema   Data Reviewed: Basic Metabolic Panel:  Recent Labs Lab 03/15/14 1228 03/16/14 0400 03/19/14 0420 03/20/14 0504  NA 137 140 139 138  K 4.9 4.9 5.3 4.8  CL 106 109 106 103  CO2 17* 18* 21 22  GLUCOSE 197* 137* 96 234*  BUN 59* 55* 44* 48*  CREATININE 1.74* 1.73* 1.71* 1.81*  CALCIUM 8.6 8.5 8.8 8.6  MG  --  1.8  --   --    Liver Function Tests:  Recent Labs Lab 03/15/14 1228  AST 20  ALT 30  ALKPHOS 103  BILITOT 0.7  PROT 5.4*  ALBUMIN 2.8*   No results found for this basename: LIPASE, AMYLASE,  in the last 168 hours No results found for this basename: AMMONIA,  in the last 168 hours CBC:  Recent Labs Lab 03/15/14 1228 03/16/14 0400  WBC 20.8* 11.8*  NEUTROABS 19.9*  --   HGB 11.0* 9.9*  HCT 33.3* 30.5*  MCV 94.9 95.0  PLT 204 188   Cardiac Enzymes:  Recent Labs Lab 03/15/14 1228  TROPONINI <0.30   BNP (last 3 results)  Recent Labs  12/06/13 1129  PROBNP 200.0*   CBG:  Recent Labs Lab 03/19/14 0741 03/19/14 1144 03/19/14 1657 03/19/14 2057 03/20/14 0753  GLUCAP 99 100* 141* 118* 164*    Recent Results (from the past 240 hour(s))  URINE CULTURE     Status: None   Collection Time    03/10/14  3:10 PM      Result Value Ref Range Status   Specimen Description URINE, RANDOM   Final   Special Requests NONE   Final   Culture  Setup Time     Final    Value: 03/10/2014 18:40     Performed at [place]     Final   Value: >=100,000 COLONIES/ML     Performed at [facility]   Culture     Final   Value: Multiple bacterial morphotypes present, none predominant. Suggest appropriate recollection if clinically indicated.     Performed at [facility]   Report Status 03/11/2014 FINAL   Final  URINE CULTURE     Status: None   Collection Time    03/15/14  2:58 PM      Result Value Ref Range Status   Specimen Description URINE, CLEAN CATCH   Final   Special Requests NONE   Final   Culture  Setup Time     Final   Value: 03/15/2014 15:43     Performed at [facility]     Final   Value: >=100,000 COLONIES/ML     Performed at [facility]   Culture     Final   Value: ESCHERICHIA COLI     Note: Confirmed Extended Spectrum Beta-Lactamase Producer (ESBL) CRITICAL RESULT CALLED TO, READ BACK BY AND VERIFIED WITH: EMILY M. AT 12:45PM ON 03/17/2014 HAJAM     Performed at [facility]   Report Status 03/17/2014 FINAL   Final   Organism ID, Bacteria ESCHERICHIA COLI   Final  CULTURE, BLOOD (ROUTINE X 2)     Status: None   Collection Time    03/15/14  4:50 PM      Result Value Ref Range Status   Specimen Description BLOOD ARM RIGHT   Final   Special Requests BOTTLES DRAWN AEROBIC AND ANAEROBIC 10CC   Final   Culture  Setup Time     Final   Value: 03/15/2014 21:42     Performed at [facility]   Culture     Final   Value:        BLOOD CULTURE RECEIVED NO GROWTH TO DATE CULTURE WILL BE HELD FOR 5 DAYS BEFORE ISSUING A FINAL NEGATIVE REPORT     Performed at [facility]   Report Status PENDING   Incomplete  CULTURE, BLOOD (ROUTINE X 2)     Status: None   Collection Time    03/15/14  5:00 PM      Result Value Ref Range Status   Specimen Description BLOOD ARM RIGHT   Final   Special Requests BOTTLES DRAWN AEROBIC ONLY 10CC   Final   Culture  Setup  Time     Final   Value: 03/15/2014 21:43     Performed at [facility]   Culture     Final   Value:        BLOOD CULTURE RECEIVED NO GROWTH TO DATE CULTURE WILL BE HELD FOR 5 DAYS BEFORE ISSUING A FINAL NEGATIVE REPORT     Performed at [facility]   Report Status PENDING   Incomplete     Studies: No results found.  Scheduled Meds: . ALPRAZolam  0.25 mg Oral BID  . B-complex with vitamin C  1 tablet Oral Daily  . bacitracin-polymyxin b  1 application Topical BID  . dabigatran  75 mg Oral BID  . furosemide  40 mg Oral Daily  . imipenem-cilastatin  250 mg Intravenous Q12H  . insulin aspart  0-9 Units Subcutaneous TID WC  . metoprolol  50 mg Oral BID  . pantoprazole  40 mg Oral Daily  . sodium chloride  3 mL Intravenous Q12H   Continuous Infusions:    Active Problems:   CKD (chronic kidney disease), stage III   UTI (lower urinary tract infection)   Pacemaker   Tachy-brady syndrome   PAF (paroxysmal atrial fibrillation)   General weakness   Type II or unspecified type diabetes mellitus without mention of complication, not stated as uncontrolled   Syncope    Time spent: >35 minutes     Kinnie Feil  [facility] Pager [phone]. If 7PM-7AM, please contact night-coverage at [email or web address], password [facility] 03/20/2014, 9:16 AM  LOS: 5 days

## 2014-03-20 NOTE — Progress Notes (Signed)
Subjective:  Patient denies any chest pain or shortness of breath. Overall feels better. Nuclear stress test could not be done as nuclear camera is out of order. Will reschedule for tomorrow  Objective:  Vital Signs in the last 24 hours: Temp:  [98.1 F (36.7 C)-98.5 F (36.9 C)] 98.1 F (36.7 C) (05/17 0553) Pulse Rate:  [80-88] 84 (05/17 1006) Resp:  [15-18] 18 (05/17 0553) BP: (98-112)/(60-69) 110/60 mmHg (05/17 1006) SpO2:  [99 %-100 %] 99 % (05/17 0553) Weight:  [50.6 kg (111 lb 8.8 oz)] 50.6 kg (111 lb 8.8 oz) (05/17 0553)  Intake/Output from previous day: 05/16 0701 - 05/17 0700 In: 100 [IV Piggyback:100] Out: -  Intake/Output from this shift: Total I/O In: 123 [P.O.:120; I.V.:3] Out: -   Physical Exam: Neck: no adenopathy, no carotid bruit, no JVD and supple, symmetrical, trachea midline Lungs: clear to auscultation bilaterally Heart: regular rate and rhythm, S1, S2 normal and Soft systolic murmur noted Abdomen: soft, non-tender; bowel sounds normal; no masses,  no organomegaly Extremities: extremities normal, atraumatic, no cyanosis or edema  Lab Results: No results found for this basename: WBC, HGB, PLT,  in the last 72 hours  Recent Labs  03/19/14 0420 03/20/14 0504  NA 139 138  K 5.3 4.8  CL 106 103  CO2 21 22  GLUCOSE 96 234*  BUN 44* 48*  CREATININE 1.71* 1.81*   No results found for this basename: TROPONINI, CK, MB,  in the last 72 hours Hepatic Function Panel No results found for this basename: PROT, ALBUMIN, AST, ALT, ALKPHOS, BILITOT, BILIDIR, IBILI,  in the last 72 hours No results found for this basename: CHOL,  in the last 72 hours No results found for this basename: PROTIME,  in the last 72 hours  Imaging: Imaging results have been reviewed and No results found.  Cardiac Studies:  Assessment/Plan:  Resolving Escherichia coli UTI  Coronary artery disease history of questionable anteroseptal wall myocardial infarction in the past   Dilated cardiomyopathy multifactorial i.e. tachycardia induced/ valvular heart disease/questionable ischemic heart disease  Compensated systolic/diastolic heart failure  Valvular heart disease  History of tachybradycardia syndrome/history of atrial fibrillation and atrial tachyarrhythmias  Chronic kidney disease stage III  History of syncope  History of CA of breast in the past  Lytic bony lesions of questionable significance  GERD  Degenerative joint disease with osteoporosis  Malnutrition  Plan Continue present management Consider adding hydralazine/nitrate if blood pressure tolerates Reschedule for nuclear stress test in a.m.  LOS: 5 days    Clent Demark 03/20/2014, 11:47 AM

## 2014-03-21 ENCOUNTER — Inpatient Hospital Stay (HOSPITAL_COMMUNITY): Payer: Medicare Other

## 2014-03-21 LAB — GLUCOSE, CAPILLARY
GLUCOSE-CAPILLARY: 81 mg/dL (ref 70–99)
GLUCOSE-CAPILLARY: 195 mg/dL — AB (ref 70–99)
Glucose-Capillary: 130 mg/dL — ABNORMAL HIGH (ref 70–99)
Glucose-Capillary: 157 mg/dL — ABNORMAL HIGH (ref 70–99)

## 2014-03-21 LAB — CLOSTRIDIUM DIFFICILE BY PCR: CDIFFPCR: NEGATIVE

## 2014-03-21 LAB — CULTURE, BLOOD (ROUTINE X 2)
CULTURE: NO GROWTH
Culture: NO GROWTH

## 2014-03-21 LAB — BETA 2 MICROGLOBULIN, SERUM: Beta-2 Microglobulin: 10.4 mg/L — ABNORMAL HIGH (ref <=2.51)

## 2014-03-21 MED ORDER — TECHNETIUM TC 99M SESTAMIBI GENERIC - CARDIOLITE
10.0000 | Freq: Once | INTRAVENOUS | Status: AC | PRN
  Administered 2014-03-21: 10 via INTRAVENOUS

## 2014-03-21 MED ORDER — TECHNETIUM TC 99M SESTAMIBI GENERIC - CARDIOLITE
30.0000 | Freq: Once | INTRAVENOUS | Status: AC | PRN
  Administered 2014-03-21: 30 via INTRAVENOUS

## 2014-03-21 MED ORDER — REGADENOSON 0.4 MG/5ML IV SOLN
0.4000 mg | Freq: Once | INTRAVENOUS | Status: AC
  Administered 2014-03-21: 0.4 mg via INTRAVENOUS
  Filled 2014-03-21: qty 5

## 2014-03-21 MED ORDER — REGADENOSON 0.4 MG/5ML IV SOLN
INTRAVENOUS | Status: AC
  Administered 2014-03-21: 0.4 mg via INTRAVENOUS
  Filled 2014-03-21: qty 5

## 2014-03-21 NOTE — Progress Notes (Signed)
Had episode of diarrhea after returning from CT scan, PCR sent for Cdiff, results are negative, will be taken off enteric precautions and placed on orange contact precautions for ESBL urine history, Hazle Nordmann RN

## 2014-03-21 NOTE — Discharge Instructions (Signed)
Information on my medicine - Pradaxa (dabigatran)  This medication education was reviewed with me or my healthcare representative as part of my discharge preparation.  The pharmacist that spoke with me during my hospital stay was:  Brain Hilts, [facility]  Why was Pradaxa prescribed for you? Pradaxa was prescribed for you to reduce the risk of forming blood clots that cause a stroke if you have a medical condition called atrial fibrillation (a type of irregular heartbeat).    What do you Need to know about PradAXa? Take your Pradaxa TWICE DAILY - one capsule in the morning and one tablet in the evening with or without food.  It would be best to take the doses about the same time each day.  The capsules should not be broken, chewed or opened - they must be swallowed whole.  Do not store Pradaxa in other medication containers - once the bottle is opened the Pradaxa should be used within FOUR months; throw away any capsules that havent been by that time.  Take Pradaxa exactly as prescribed by your doctor.  DO NOT stop taking Pradaxa without talking to the doctor who prescribed the medication.  Stopping without other stroke prevention medication to take the place of Pradaxa may increase your risk of developing a clot that causes a stroke.  Refill your prescription before you run out.  After discharge, you should have regular check-up appointments with your healthcare provider that is prescribing your Pradaxa.  In the future your dose may need to be changed if your kidney function or weight changes by a significant amount.  What do you do if you miss a dose? If you miss a dose, take it as soon as you remember on the same day.  If your next dose is less than 6 hours away, skip the missed dose.  Do not take two doses of PRADAXA at the same time.  Important Safety Information A possible side effect of Pradaxa is bleeding. You should call your healthcare provider right away if you experience  any of the following:   Bleeding from an injury or your nose that does not stop.   Unusual colored urine (red or dark brown) or unusual colored stools (red or black).   Unusual bruising for unknown reasons.   A serious fall or if you hit your head (even if there is no bleeding).  Some medicines may interact with Pradaxa and might increase your risk of bleeding or clotting while on Pradaxa. To help avoid this, consult your healthcare provider or pharmacist prior to using any new prescription or non-prescription medications, including herbals, vitamins, non-steroidal anti-inflammatory drugs (NSAIDs) and supplements.  This website has more information on Pradaxa (dabigatran): www.[employer].cz.

## 2014-03-21 NOTE — Progress Notes (Signed)
Subjective:  Patient denies any chest pain or shortness of breath.  Seen in nuclear medicine department.  Tolerated Lexiscan portion of the stress test  Objective:  Vital Signs in the last 24 hours: Temp:  [98 F (36.7 C)-98.7 F (37.1 C)] 98 F (36.7 C) (05/18 0500) Pulse Rate:  [80-84] 84 (05/18 0500) Resp:  [18] 18 (05/18 0500) BP: (100-113)/(49-61) 108/51 mmHg (05/18 1358) SpO2:  [97 %-98 %] 98 % (05/18 0500) Weight:  [50.9 kg (112 lb 3.4 oz)] 50.9 kg (112 lb 3.4 oz) (05/18 0500)  Intake/Output from previous day: 05/17 0701 - 05/18 0700 In: 803 [P.O.:600; I.V.:3; IV Piggyback:200] Out: -  Intake/Output from this shift: Total I/O In: 3 [I.V.:3] Out: -   Physical Exam: Neck: no adenopathy, no carotid bruit, no JVD and supple, symmetrical, trachea midline Lungs: clear to auscultation bilaterally Heart: regular rate and rhythm, S1, S2 normal and soft systolic murmur noted Abdomen: soft, non-tender; bowel sounds normal; no masses,  no organomegaly Extremities: extremities normal, atraumatic, no cyanosis or edema  Lab Results: No results found for this basename: WBC, HGB, PLT,  in the last 72 hours  Recent Labs  03/19/14 0420 03/20/14 0504  NA 139 138  K 5.3 4.8  CL 106 103  CO2 21 22  GLUCOSE 96 234*  BUN 44* 48*  CREATININE 1.71* 1.81*   No results found for this basename: TROPONINI, CK, MB,  in the last 72 hours Hepatic Function Panel No results found for this basename: PROT, ALBUMIN, AST, ALT, ALKPHOS, BILITOT, BILIDIR, IBILI,  in the last 72 hours No results found for this basename: CHOL,  in the last 72 hours No results found for this basename: PROTIME,  in the last 72 hours  Imaging: Imaging results have been reviewed and No results found.  Cardiac Studies:  Assessment/Plan:  Resolving Escherichia coli UTI  Coronary artery disease history of questionable anteroseptal wall myocardial infarction in the past  Dilated cardiomyopathy with progressive  worsening systolic function multifactorial i.e. tachycardia induced/ valvular heart disease/questionable ischemic heart disease  Compensated systolic/diastolic heart failure  Valvular heart disease  History of tachybradycardia syndrome/history of atrial fibrillation and atrial tachyarrhythmias  Chronic kidney disease stage III  History of syncope  History of CA of breast in the past  Lytic bony lesions of questionable significance  GERD  Degenerative joint disease with osteoporosis  Malnutrition  Plan Continue present management Check Lexiscan Myoview result  LOS: 6 days    Clent Demark 03/21/2014, 3:27 PM

## 2014-03-21 NOTE — Progress Notes (Signed)
ANTIBIOTIC CONSULT NOTE - FOLLOW UP  Pharmacy Consult for Primaxin Indication: ESBL EColi  Allergies  Allergen Reactions  . Codeine Nausea And Vomiting  . Other Nausea And Vomiting    Narcotics  dizziness  Patient Measurements: Height: 5\' 1"  (154.9 cm) Weight: 112 lb 3.4 oz (50.9 kg) IBW/kg (Calculated) : 47.8 Vital Signs: Temp: 98 F (36.7 C) (05/18 0500) Temp src: Oral (05/18 0500) BP: 102/54 mmHg (05/18 0500) Pulse Rate: 84 (05/18 0500) Intake/Output from previous day: 05/17 0701 - 05/18 0700 In: 803 [P.O.:600; I.V.:3; IV Piggyback:200] Out: -  Labs:  Recent Labs  03/19/14 0420 03/20/14 0504  CREATININE 1.71* 1.81*   Estimated Creatinine Clearance: 16.5 ml/min (by C-G formula based on Cr of 1.81).  Microbiology: Recent Results (from the past 720 hour(s))  URINE CULTURE     Status: None   Collection Time    03/09/14 11:48 AM      Result Value Ref Range Status   Specimen Description URINE, CATHETERIZED   Final   Special Requests NONE   Final   Culture  Setup Time     Final   Value: 03/09/2014 17:47     Performed at [place]     Final   Value: NO GROWTH     Performed at [facility]   Culture     Final   Value: NO GROWTH     Performed at [facility]   Report Status 03/10/2014 FINAL   Final  URINE CULTURE     Status: None   Collection Time    03/10/14  3:10 PM      Result Value Ref Range Status   Specimen Description URINE, RANDOM   Final   Special Requests NONE   Final   Culture  Setup Time     Final   Value: 03/10/2014 18:40     Performed at [facility]     Final   Value: >=100,000 COLONIES/ML     Performed at [facility]   Culture     Final   Value: Multiple bacterial morphotypes present, none predominant. Suggest appropriate recollection if clinically indicated.     Performed at [facility]   Report Status 03/11/2014 FINAL   Final  URINE CULTURE     Status:  None   Collection Time    03/15/14  2:58 PM      Result Value Ref Range Status   Specimen Description URINE, CLEAN CATCH   Final   Special Requests NONE   Final   Culture  Setup Time     Final   Value: 03/15/2014 15:43     Performed at [place]     Final   Value: >=100,000 COLONIES/ML     Performed at [facility]   Culture     Final   Value: ESCHERICHIA COLI     Note: Confirmed Extended Spectrum Beta-Lactamase Producer (ESBL) CRITICAL RESULT CALLED TO, READ BACK BY AND VERIFIED WITH: EMILY M. AT 12:45PM ON 03/17/2014 HAJAM     Performed at [facility]   Report Status 03/17/2014 FINAL   Final   Organism ID, Bacteria ESCHERICHIA COLI   Final  CULTURE, BLOOD (ROUTINE X 2)     Status: None   Collection Time    03/15/14  4:50 PM      Result Value Ref Range Status   Specimen Description BLOOD  ARM RIGHT   Final   Special Requests BOTTLES DRAWN AEROBIC AND ANAEROBIC 10CC   Final   Culture  Setup Time     Final   Value: 03/15/2014 21:42     Performed at [facility]   Culture     Final   Value: NO GROWTH 5 DAYS     Performed at [facility]   Report Status 03/21/2014 FINAL   Final  CULTURE, BLOOD (ROUTINE X 2)     Status: None   Collection Time    03/15/14  5:00 PM      Result Value Ref Range Status   Specimen Description BLOOD ARM RIGHT   Final   Special Requests BOTTLES DRAWN AEROBIC ONLY 10CC   Final   Culture  Setup Time     Final   Value: 03/15/2014 21:43     Performed at [facility]   Culture     Final   Value: NO GROWTH 5 DAYS     Performed at [facility]   Report Status 03/21/2014 FINAL   Final    Assessment: 89 YOM with ESBL Ecoli UTI on Primaxin day # 5. WBC wnl. Afebrile. CKDIII- SCr 1.81 on 5/17- no new BMET. Estimated CrCl~15-20 mL/min.   Goal of Therapy:  Clinical resolution of infection  Plan:  Continue Primaxin 250mg  IV q12h.  Follow-up renal function.  Follow-up LOT.    Sloan Leiter, PharmD, BCPS Clinical Pharmacist [phone] 03/21/2014,10:37 AM

## 2014-03-21 NOTE — Progress Notes (Signed)
Pt has bed available at [facility] when medically stable. CSW will continue to update facility as needed.   [place], [place]

## 2014-03-22 LAB — GLUCOSE, CAPILLARY
GLUCOSE-CAPILLARY: 105 mg/dL — AB (ref 70–99)
Glucose-Capillary: 123 mg/dL — ABNORMAL HIGH (ref 70–99)

## 2014-03-22 MED ORDER — FOSFOMYCIN TROMETHAMINE 3 G PO PACK
3.0000 g | PACK | Freq: Once | ORAL | Status: AC
  Administered 2014-03-22: 3 g via ORAL
  Filled 2014-03-22: qty 3

## 2014-03-22 MED ORDER — FUROSEMIDE 20 MG PO TABS: 20.0000 mg | ORAL_TABLET | ORAL | Status: DC

## 2014-03-22 MED ORDER — NITROFURANTOIN MONOHYD MACRO 100 MG PO CAPS: 100.0000 mg | ORAL_CAPSULE | Freq: Every day | ORAL | Status: DC

## 2014-03-22 MED ORDER — GLIPIZIDE 5 MG PO TABS: 2.5000 mg | ORAL_TABLET | Freq: Every day | ORAL | Status: DC

## 2014-03-22 MED ORDER — ALPRAZOLAM 0.25 MG PO TABS: 0.2500 mg | ORAL_TABLET | Freq: Two times a day (BID) | ORAL | Status: DC

## 2014-03-22 NOTE — Discharge Summary (Addendum)
Physician Discharge Summary  Sophia Oliver HUT:[id number] DOB: Mar 02, 1924 DOA: 03/15/2014  PCP: [name], MD  Admit date: 03/15/2014 Discharge date: 03/22/2014  Time spent: >35 minutes  Recommendations for Outpatient Follow-up:  SNF F/u with Dr. Terrence Dupont in 2 weeks F/u with Caryl Comes in 2 week s F/u with PCP in 1 week post rehab  Discharge Diagnoses:  Active Problems:   CKD (chronic kidney disease), stage III   UTI (lower urinary tract infection)   Pacemaker   Tachy-brady syndrome   PAF (paroxysmal atrial fibrillation)   General weakness   Type II or unspecified type diabetes mellitus without mention of complication, not stated as uncontrolled   Syncope   Discharge Condition: stable   Diet recommendation: DM   Filed Weights   03/20/14 0553 03/21/14 0500 03/22/14 0500  Weight: 50.6 kg (111 lb 8.8 oz) 50.9 kg (112 lb 3.4 oz) 48.807 kg (107 lb 9.6 oz)    History of present illness:  80 y.o. female, Sophia Oliver is a 80 y.o. female, history of sinus node dysfunction status post pacemaker placement, atrial fibrillation on amiodarone along with Pradaxa all of which [place] EP, breast cancer, DJD, chronic kidney disease stage III baseline creatinine between 1.2-1.5, anxiety, chronic systolic heart failure EF 30%, who lives at home and has been feeling weak for the last 3-4 weeks, also having persistent falls found to have UTI   Hospital Course:  1. Persistent falls with leukocytosis with UTI; neuro exam no focal; recurrent UTI;  -improved on IV atx, afebrile, no leukocytosis; blood cultures NGTD; urine E coli; initially was on IV zosyn, atx changed to imipenem, completed 6 days IV;  S/p fosfomycin x 1 -no new episodes of fall; needs SNF  2. Sinus node dysfunction, underlying PAF s/p PPM; episode of brady  -per Dr. Jens Som ? Need to replace PPM; recommended to hold amiodarone, and f/u in 2 weeks as OP  3. Diabetes mellitus type 2. Recent A1c was 8.7  -hypoglycemic episode decreased glipizide  to 2.5; monitor OP  4. Lytic lesions noted on CT scan head last admission and again this admission  -per hematology evaluation unlikely MM, since polyclonal on protein electrophoresis; recommended OP f/u  5. Chronic systolic CHF LVEF 65% on 04/8126-->51 % (03/2014), dilated cardiomyopathy, pulmonary HTN, Dyskinesis of the basal-midanteroseptal myocardium  -Pt was evaluated by cardiologist Dr. Sarina Ser input;  -Pt may not be a good candidate for LHC with CKD;  -clinically improved; no s/s of acute fluid overload; cont current regimen, BP does not tolerate hydralazine/ntg at this time; improved on lasix, BB; ACE on hold due to AKI   Stress test:  1. Progressive apical hypokinesis with paradoxical motion in the  distal septum and calculated ejection fraction of 39%.  2. Fixed perfusion defect involving the apex and distal anteroseptal  segments consistent with myocardial scar. No evidence of  peri-infarct ischemia   Procedures:  Stress test as above (i.e. Studies not automatically included, echos, thoracentesis, etc; not x-rays)  Consultations:  Cardiology, EP  Discharge Exam: Filed Vitals:   03/22/14 0500  BP: 112/72  Pulse: 74  Temp: 97.8 F (36.6 C)  Resp: 18    General: alert Cardiovascular: s1,s2 rrr Respiratory: CAT BL  Discharge Instructions  Discharge Instructions   Diet - low sodium heart healthy    Complete by:  As directed      Discharge instructions    Complete by:  As directed   Please follow up with Dr. Terrence Dupont, and Dr. Georgette Shell in  2 weeks Please follow up with primary care doctor in 1 week post rehab     Increase activity slowly    Complete by:  As directed             Medication List    STOP taking these medications       amiodarone 200 MG tablet  Commonly known as:  PACERONE     doxycycline 100 MG capsule  Commonly known as:  VIBRAMYCIN     nitrofurantoin (macrocrystal-monohydrate) 100 MG capsule  Commonly known as:  MACROBID      TAKE these  medications       acetaminophen 325 MG tablet  Commonly known as:  TYLENOL  Take 325 mg by mouth every 6 (six) hours as needed for pain.     ALPRAZolam 0.25 MG tablet  Commonly known as:  XANAX  Take 1 tablet (0.25 mg total) by mouth 2 (two) times daily.     B-complex with vitamin C tablet  Take 1 tablet by mouth daily.     bacitracin-polymyxin b ointment  Commonly known as:  POLYSPORIN  Apply 1 application topically 2 (two) times daily. Apply twice daily to ear lobes.     dabigatran 75 MG Caps capsule  Commonly known as:  PRADAXA  Take 75 mg by mouth 2 (two) times daily.     diclofenac sodium 1 % Gel  Commonly known as:  VOLTAREN  Apply 2 g topically 4 (four) times daily.     furosemide 20 MG tablet  Commonly known as:  LASIX  Take 1 tablet (20 mg total) by mouth every other day.     glipiZIDE 5 MG tablet  Commonly known as:  GLUCOTROL  Take 0.5 tablets (2.5 mg total) by mouth daily before breakfast.     metoprolol 50 MG tablet  Commonly known as:  LOPRESSOR  Take 1 tablet (50 mg total) by mouth 2 (two) times daily.     pantoprazole 40 MG tablet  Commonly known as:  PROTONIX  Take 40 mg by mouth daily.       Allergies  Allergen Reactions  . Codeine Nausea And Vomiting  . Other Nausea And Vomiting    Narcotics  dizziness       Follow-up Information   Follow up with Virl Axe, MD. Schedule an appointment as soon as possible for a visit in 2 weeks.   Specialty:  Cardiology   Contact information:   [address] [place] [address] [phone]       Follow up with Clent Demark, MD. Schedule an appointment as soon as possible for a visit in 2 weeks.   Specialty:  Cardiology   Contact information:   [place] [address] [phone]       Follow up with [name], MD In 1 week.   Specialty:  Internal Medicine   Contact information:   [address] [place]   [address] [phone]        The results of significant diagnostics from this hospitalization (including imaging, microbiology, ancillary and laboratory) are listed below for reference.    Significant Diagnostic Studies: Dg Chest 2 View  03/15/2014   CLINICAL DATA:  Weakness  EXAM: CHEST  2 VIEW  COMPARISON:  Prior radiograph from 11/19/2012  FINDINGS: Right-sided pacemaker/AICD with electrodes overlying the right ventricle and right atrium are unchanged. Cardiomegaly is grossly stable.  There is diffuse pulmonary vascular congestion with indistinctness  of the interstitial markings, suggesting mild pulmonary edema. A few scattered Kerley B-lines are noted. Bibasilar atelectasis is present as well. Small bilateral pleural effusions noted. No definite focal infiltrate. There is no pneumothorax.  Right shoulder arthroplasty partially visualized. Severe degenerative changes noted at the left shoulder. Diffuse osteopenia present. No acute osseus abnormality.  IMPRESSION: 1. Cardiomegaly with findings suggestive of mild diffuse pulmonary edema. 2. Small bilateral pleural effusions. 3. Mild bibasilar atelectasis.   Electronically Signed   By: Jeannine Boga M.D.   On: 03/15/2014 14:07   Dg Chest 2 View  03/09/2014   CLINICAL DATA:  Status post fall.  EXAM: CHEST  2 VIEW  COMPARISON:  Single view of the chest of 11/19/2012.  FINDINGS: There is cardiomegaly and vascular congestion without frank edema. No consolidative process, pneumothorax or effusion is identified. Skin fold projecting over the right chest is noted. No acute bony abnormality is seen. Remote left rib fractures are noted and unchanged. The patient is status post right shoulder replacement with advanced degenerative change about the left shoulder.  IMPRESSION: Cardiomegaly without acute disease.   Electronically Signed   By: Inge Rise M.D.   On: 03/09/2014 11:37   Dg Pelvis 1-2 Views  03/15/2014   CLINICAL DATA:  Weakness over the  last 2-3 weeks. Multiple falls. History of breast cancer.  EXAM: PELVIS - 1-2 VIEW  COMPARISON:  None.  FINDINGS: Diffuse osteopenia is present. Left total hip arthroplasty. Atherosclerosis. Partially visualized lumbar spondylosis. The obturator rings appear intact. No fracture is identified. Visualized sacral arcades appear within normal limits.  Old left inferior pubic ramus fracture.  IMPRESSION: Osteopenia.  No acute osseous abnormality.   Electronically Signed   By: Dereck Ligas M.D.   On: 03/15/2014 14:08   Ct Head Wo Contrast  03/15/2014   CLINICAL DATA:  Generalized weakness  EXAM: CT HEAD WITHOUT CONTRAST  CT CERVICAL SPINE WITHOUT CONTRAST  TECHNIQUE: Multidetector CT imaging of the head and cervical spine was performed following the standard protocol without intravenous contrast. Multiplanar CT image reconstructions of the cervical spine were also generated.  COMPARISON:  CT HEAD W/O CM dated 03/09/2014; CT of the head and cervical spine without contrast dated 11/19/2012  FINDINGS: CT HEAD FINDINGS  There is moderate diffuse cerebral and cerebellar atrophy with compensatory ventriculomegaly. This is not out of proportion to the patient's age. There is decreased density in the deep white matter of both cerebral hemispheres consistent with chronic small vessel ischemic type change. There is no evidence of an acute ischemic infarction. The cerebellum and brainstem are normal in density.  At bone window settings there is soft tissue density material within the right sphenoid sinus which is stable. This may reflect chronic inflammation. There are no sinus air-fluid levels. The mastoid air cells are well pneumatized. There is no evidence of an acute skull fracture. There remain rounded lucencies within the frontal and parietal bones on the right.  CT CERVICAL SPINE FINDINGS  There is stable loss of the normal cervical lordosis. There is stable anterolisthesis of C3 with respect to C4 amounting to  approximately 4 mm. There is stable disc space narrowing at C3-4, C4-5, C5-6, and C6-7. The prevertebral soft tissue spaces appear normal. There is no perched facet. There is fusion across the facets at the C4-5 level on the right. The odontoid is intact and the lateral masses of C1 align normally with those of C2. The bony ring at the cervical level is intact. The observed portions  of the first and second ribs appear normal. The pulmonary apices are clear.  IMPRESSION: 1. There is no evidence of an acute ischemic or hemorrhagic infarction. There are age related atrophic changes. There is no intracranial mass effect. 2. There stable lucencies within the right frontal and right parietal bones consistent with the clinically suspected multiple myeloma. 3. There is no evidence of an acute cervical spine fracture nor dislocation. No lytic or blastic vertebral body or posterior element lesion is demonstrated. There are degenerative disc and facet joint changes at multiple cervical levels. Stable grade 1 anterolisthesis of C3 with respect to C4 is present.   Electronically Signed   By: David  [place]   On: 03/15/2014 14:50   Ct Head Wo Contrast  03/09/2014   CLINICAL DATA:  Fall.  EXAM: CT HEAD WITHOUT CONTRAST  TECHNIQUE: Contiguous axial images were obtained from the base of the skull through the vertex without intravenous contrast.  COMPARISON:  CT 11/19/2012  FINDINGS: Moderate atrophy. Moderate chronic microvascular ischemic change in the white matter. This is stable.  Negative for acute infarct.  Negative for hemorrhage or mass.  Rounded lytic lesions in the right frontal bone and right parietal bone are unchanged. These are indeterminate but could be seen with myeloma. Metastatic disease and benign causes are also possible.  IMPRESSION: Atrophy and chronic microvascular ischemia. No acute intracranial abnormality.  Lytic lesions right frontal bone and right parietal bone are stable. Question myeloma    Electronically Signed   By: Franchot Gallo M.D.   On: 03/09/2014 13:04   Ct Cervical Spine Wo Contrast  03/15/2014   CLINICAL DATA:  Generalized weakness  EXAM: CT HEAD WITHOUT CONTRAST  CT CERVICAL SPINE WITHOUT CONTRAST  TECHNIQUE: Multidetector CT imaging of the head and cervical spine was performed following the standard protocol without intravenous contrast. Multiplanar CT image reconstructions of the cervical spine were also generated.  COMPARISON:  CT HEAD W/O CM dated 03/09/2014; CT of the head and cervical spine without contrast dated 11/19/2012  FINDINGS: CT HEAD FINDINGS  There is moderate diffuse cerebral and cerebellar atrophy with compensatory ventriculomegaly. This is not out of proportion to the patient's age. There is decreased density in the deep white matter of both cerebral hemispheres consistent with chronic small vessel ischemic type change. There is no evidence of an acute ischemic infarction. The cerebellum and brainstem are normal in density.  At bone window settings there is soft tissue density material within the right sphenoid sinus which is stable. This may reflect chronic inflammation. There are no sinus air-fluid levels. The mastoid air cells are well pneumatized. There is no evidence of an acute skull fracture. There remain rounded lucencies within the frontal and parietal bones on the right.  CT CERVICAL SPINE FINDINGS  There is stable loss of the normal cervical lordosis. There is stable anterolisthesis of C3 with respect to C4 amounting to approximately 4 mm. There is stable disc space narrowing at C3-4, C4-5, C5-6, and C6-7. The prevertebral soft tissue spaces appear normal. There is no perched facet. There is fusion across the facets at the C4-5 level on the right. The odontoid is intact and the lateral masses of C1 align normally with those of C2. The bony ring at the cervical level is intact. The observed portions of the first and second ribs appear normal. The pulmonary apices  are clear.  IMPRESSION: 1. There is no evidence of an acute ischemic or hemorrhagic infarction. There are age related atrophic changes. There  is no intracranial mass effect. 2. There stable lucencies within the right frontal and right parietal bones consistent with the clinically suspected multiple myeloma. 3. There is no evidence of an acute cervical spine fracture nor dislocation. No lytic or blastic vertebral body or posterior element lesion is demonstrated. There are degenerative disc and facet joint changes at multiple cervical levels. Stable grade 1 anterolisthesis of C3 with respect to C4 is present.   Electronically Signed   By: David  [place]   On: 03/15/2014 14:50   US Renal  03/17/2014   CLINICAL DATA:  Acute kidney injury. Recurring urinary tract infections. History of diabetes.  EXAM: RENAL/URINARY TRACT ULTRASOUND COMPLETE  COMPARISON:  Abdominal CT 01/23/2012.  Renal ultrasound 01/08/2012.  FINDINGS: Right Kidney:  Length: 9.3 cm. There is progressive cortical thinning without focal cortical lesion or hydronephrosis. Mild perinephric fluid is present.  Left Kidney:  Length: 9.4 cm. There is progressive cortical thinning. There is a mildly septated cyst in the lower pole which measures 4.2 x 2.7 x 3.3 cm. No hydronephrosis. Moderate size left pleural effusion noted incidentally.  Bladder:  Appears normal for degree of bladder distention.  IMPRESSION: Under  1. Progressive bilateral renal cortical thinning consistent with chronic medical renal disease. 2. No evidence of hydronephrosis. 3. Mildly septated cyst in the lower pole of the left kidney.   Electronically Signed   By: Camie Patience M.D.   On: 03/17/2014 19:19   Nm Myocar Multi W/spect W/wall Motion / Ef  03/21/2014   CLINICAL DATA:  Chest pain.  History of pacemaker.  EXAM: MYOCARDIAL IMAGING WITH SPECT (REST AND PHARMACOLOGIC-STRESS)  GATED LEFT VENTRICULAR WALL MOTION STUDY  LEFT VENTRICULAR EJECTION FRACTION  TECHNIQUE: Standard  myocardial SPECT imaging was performed after resting intravenous injection of 10 mCi Tc-50msestamibi. Subsequently, intravenous infusion of Lexiscan was performed under the supervision of the Cardiology staff. At peak effect of the drug, 30 mCi Tc-99mestamibi was injected intravenously and standard myocardial SPECT imaging was performed. Quantitative gated imaging was also performed to evaluate left ventricular wall motion, and estimate left ventricular ejection fraction.  COMPARISON:  Chest radiographs 03/15/2014. Nuclear medicine myocardial imaging 08/02/2004.  FINDINGS: SPECT images demonstrate a progressive fixed perfusion defect involving the apex and distal anterior septal segments. There are no reversible perfusion defects.  Gated images were reviewed at the workstation and demonstrate apical hypokinesis with paradoxical motion involving the distal septum. The QGS ejection fraction calculated at rest is 39% with an end-diastolic volume of [id number] an end-systolic volume of [id number] ejection fraction on the prior examination was 62%.  IMPRESSION: 1. Progressive apical hypokinesis with paradoxical motion in the distal septum and calculated ejection fraction of 39%. 2. Fixed perfusion defect involving the apex and distal anteroseptal segments consistent with myocardial scar. No evidence of peri-infarct ischemia.   Electronically Signed   By: BiCamie Patience.D.   On: 03/21/2014 16:41   Dg Bone Survey Met  03/11/2014   CLINICAL DATA:  Pain.  EXAM: METASTATIC BONE SURVEY  COMPARISON:  Bone scan 02/05/2005.  FINDINGS: Standard metastatic bone survey performed. Images of the axial and appendicular skeleton obtained. No evidence of metastatic disease. No focal lytic or sclerotic bony lesions are noted. Postsurgical changes noted over both shoulders and over the chest. Postsurgical changes left hip and both knees. Severe degenerative changes noted throughout the spine.Peripheral vascular disease. Cardiomegaly is  noted. Cardiac pacer with lead tips in right atrium right ventricle noted.  IMPRESSION: No evidence of  metastatic disease.   Electronically Signed   By: [place]   On: 03/11/2014 07:49   Dg Foot Complete Left  03/15/2014   CLINICAL DATA:  Left foot pain. Lateral foot pain this following a fall.  EXAM: LEFT FOOT - COMPLETE 3+ VIEW  COMPARISON:  None.  FINDINGS: The alignment of the left foot is anatomic. Moderate first MTP joint osteoarthritis. Osteopenia. No displaced fracture is present. Fifth metatarsal appears within normal limits. Diabetic type small vessel atherosclerotic calcification. Second toe PIP joint severe degenerative changes are present, which may represent posttraumatic osteoarthritis or erosive osteoarthritis.  IMPRESSION: No acute osseous abnormality.  Osteopenia.   Electronically Signed   By: Dereck Ligas M.D.   On: 03/15/2014 14:12    Microbiology: Recent Results (from the past 240 hour(s))  URINE CULTURE     Status: None   Collection Time    03/15/14  2:58 PM      Result Value Ref Range Status   Specimen Description URINE, CLEAN CATCH   Final   Special Requests NONE   Final   Culture  Setup Time     Final   Value: 03/15/2014 15:43     Performed at [place]     Final   Value: >=100,000 COLONIES/ML     Performed at [facility]   Culture     Final   Value: ESCHERICHIA COLI     Note: Confirmed Extended Spectrum Beta-Lactamase Producer (ESBL) CRITICAL RESULT CALLED TO, READ BACK BY AND VERIFIED WITH: EMILY M. AT 12:45PM ON 03/17/2014 HAJAM     Performed at [facility]   Report Status 03/17/2014 FINAL   Final   Organism ID, Bacteria ESCHERICHIA COLI   Final  CULTURE, BLOOD (ROUTINE X 2)     Status: None   Collection Time    03/15/14  4:50 PM      Result Value Ref Range Status   Specimen Description BLOOD ARM RIGHT   Final   Special Requests BOTTLES DRAWN AEROBIC AND ANAEROBIC 10CC   Final   Culture  Setup Time      Final   Value: 03/15/2014 21:42     Performed at [facility]   Culture     Final   Value: NO GROWTH 5 DAYS     Performed at [facility]   Report Status 03/21/2014 FINAL   Final  CULTURE, BLOOD (ROUTINE X 2)     Status: None   Collection Time    03/15/14  5:00 PM      Result Value Ref Range Status   Specimen Description BLOOD ARM RIGHT   Final   Special Requests BOTTLES DRAWN AEROBIC ONLY 10CC   Final   Culture  Setup Time     Final   Value: 03/15/2014 21:43     Performed at [facility]   Culture     Final   Value: NO GROWTH 5 DAYS     Performed at [facility]   Report Status 03/21/2014 FINAL   Final  CLOSTRIDIUM DIFFICILE BY PCR     Status: None   Collection Time    03/21/14  3:37 PM      Result Value Ref Range Status   C difficile by pcr NEGATIVE  NEGATIVE Final     Labs: Basic Metabolic Panel:  Recent Labs Lab 03/15/14 1228 03/16/14 0400 03/19/14 0420 03/20/14 0504  NA 137 140 139 138  K  4.9 4.9 5.3 4.8  CL 106 109 106 103  CO2 17* 18* 21 22  GLUCOSE 197* 137* 96 234*  BUN 59* 55* 44* 48*  CREATININE 1.74* 1.73* 1.71* 1.81*  CALCIUM 8.6 8.5 8.8 8.6  MG  --  1.8  --   --    Liver Function Tests:  Recent Labs Lab 03/15/14 1228  AST 20  ALT 30  ALKPHOS 103  BILITOT 0.7  PROT 5.4*  ALBUMIN 2.8*   No results found for this basename: LIPASE, AMYLASE,  in the last 168 hours No results found for this basename: AMMONIA,  in the last 168 hours CBC:  Recent Labs Lab 03/15/14 1228 03/16/14 0400  WBC 20.8* 11.8*  NEUTROABS 19.9*  --   HGB 11.0* 9.9*  HCT 33.3* 30.5*  MCV 94.9 95.0  PLT 204 188   Cardiac Enzymes:  Recent Labs Lab 03/15/14 1228  TROPONINI <0.30   BNP: BNP (last 3 results)  Recent Labs  12/06/13 1129  PROBNP 200.0*   CBG:  Recent Labs Lab 03/21/14 0816 03/21/14 1154 03/21/14 1534 03/21/14 2053 03/22/14 0721  GLUCAP 130* 81 157* 195* 105*       Signed:  Susie Ehresman N  Karrissa Parchment  [facility] 03/22/2014, 10:09 AM

## 2014-03-22 NOTE — Progress Notes (Signed)
CSW (Clinical Education officer, museum) prepared pt dc packet and placed with shadow chart. CSW arranged non-emergent ambulance transport for 1pm. Pt, pt nurse, and facility informed. Pt informed CSW she would notify her husband. CSW signing off.  [place], [place]

## 2014-03-22 NOTE — Progress Notes (Signed)
Patient Name: Sophia Oliver      SUBJECTIVE:*without pain but weak  Past Medical History  Diagnosis Date  . Syncope   . Sinus node dysfunction     a. s/p pacemaker.  . Wide-complex tachycardia   . Cholelithiasis   . Glucose intolerance (impaired glucose tolerance)   . Anxiety   . PONV (postoperative nausea and vomiting)   . Chronic kidney disease     a. Baseline Cr reported 1.8-2.0.  . Injury of right upper arm     after venipuncture in 3/13 - pt describes pain afterwards- no followup done seen by PCP- no further treatment   . PAF (paroxysmal atrial fibrillation)   . Left bundle branch block   . Tachycardia-bradycardia syndrome   . CHF (congestive heart failure)     a. EF 30-35% by echo 2015.  [name] Pacemaker     Guidant, implanted 2006  . Type II diabetes mellitus     "recently dx'd" (03/09/2014)  . Breast cancer 2003    "radiation on right; mastectomy on the left"  . Hypertension   . GERD (gastroesophageal reflux disease)   . DJD (degenerative joint disease)   . Arthritis     "real bad in my lower back" (03/09/2014)  . Chronic lower back pain   . Orthostatic hypotension     a. Diuretics d/c'd 03/2014.  [name] PAT (paroxysmal atrial tachycardia)     Scheduled Meds:  Scheduled Meds: . ALPRAZolam  0.25 mg Oral BID  . B-complex with vitamin C  1 tablet Oral Daily  . bacitracin-polymyxin b  1 application Topical BID  . dabigatran  75 mg Oral BID  . furosemide  20 mg Oral Daily  . imipenem-cilastatin  250 mg Intravenous Q12H  . insulin aspart  0-9 Units Subcutaneous TID WC  . metoprolol  50 mg Oral BID  . pantoprazole  40 mg Oral Daily  . sodium chloride  3 mL Intravenous Q12H   Continuous Infusions:   PHYSICAL EXAM Filed Vitals:   03/21/14 1356 03/21/14 1358 03/21/14 2108 03/22/14 0500  BP: 103/51 108/51 109/53 112/72  Pulse:   76 74  Temp:   98.3 F (36.8 C) 97.8 F (36.6 C)  TempSrc:   Oral Oral  Resp:   18 18  Height:      Weight:    107 lb 9.6 oz  (48.807 kg)  SpO2:   96% 96%    Well developed and nourished in no acute distress HENT normal Neck supple with JVP-flat Clear Old right sided breast implant Regular rate and rhythm, no murmurs or gallops Abd-soft with active BS No Clubbing cyanosis edema Skin-warm and dry A & Oriented  Grossly normal sensory and motor function\  TELEMETRY: Reviewed telemetry pt in *    Intake/Output Summary (Last 24 hours) at 03/22/14 0830 Last data filed at 03/21/14 1650  Gross per 24 hour  Intake    463 ml  Output      0 ml  Net    463 ml   NOT MEASURED  LABS: Basic Metabolic Panel:  Recent Labs Lab 03/15/14 1228 03/16/14 0400 03/19/14 0420 03/20/14 0504  NA 137 140 139 138  K 4.9 4.9 5.3 4.8  CL 106 109 106 103  CO2 17* 18* 21 22  GLUCOSE 197* 137* 96 234*  BUN 59* 55* 44* 48*  CREATININE 1.74* 1.73* 1.71* 1.81*  CALCIUM 8.6 8.5 8.8 8.6  MG  --  1.8  --   --  Cardiac Enzymes: No results found for this basename: CKTOTAL, CKMB, CKMBINDEX, TROPONINI,  in the last 72 hours CBC:  Recent Labs Lab 03/15/14 1228 03/16/14 0400  WBC 20.8* 11.8*  NEUTROABS 19.9*  --   HGB 11.0* 9.9*  HCT 33.3* 30.5*  MCV 94.9 95.0  PLT 204 188   PROTIME: No results found for this basename: LABPROT, INR,  in the last 72 hours Liver Function Tests: No results found for this basename: AST, ALT, ALKPHOS, BILITOT, PROT, ALBUMIN,  in the last 72 hours No results found for this basename: LIPASE, AMYLASE,  in the last 72 hours BNP: BNP (last 3 results)  Recent Labs  12/06/13 1129  PROBNP 200.0*   D-Dimer: No results found for this basename: DDIMER,  in the last 72 hours Hemoglobin A1C: No results found for this basename: HGBA1C,  in the last 72 hours Fasting Lipid Panel: No results found for this basename: CHOL, HDL, LDLCALC, TRIG, CHOLHDL, LDLDIRECT,  in the last 72 hours Thyroid Function Tests: No results found for this basename: TSH, T4TOTAL, FREET3, T3FREE, THYROIDAB,  in the last  72 hours Anemia Panel: No results found for this basename: VITAMINB12, FOLATE, FERRITIN, TIBC, IRON, RETICCTPCT,  in the last 72 hours  Nuclear scan EF 39%  No ischemia ASSESSMENT AND PLAN:  Active Problems:   CKD (chronic kidney disease), stage III   UTI (lower urinary tract infection)   Pacemaker   Tachy-brady syndrome   PAF (paroxysmal atrial fibrillation)   General weakness   Type II or unspecified type diabetes mellitus without mention of complication, not stated as uncontrolled   Syncope  Can consider CRT  Will see her as outpt Should she be on ACE for LV dysfuntion == will need close followup for renal function  Continue off amiodarone To go rehab And will see to consider to upgrade  Signed, Deboraha Sprang MD  03/22/2014

## 2014-03-22 NOTE — Progress Notes (Signed)
Subjective:  Patient denies any chest pain or shortness of breath. Complains of overall generalized weakness. Denies any fever or chills. Results of nuclear stress test noted no evidence of reversible ischemia EF appears to have improved Objective:  Vital Signs in the last 24 hours: Temp:  [97.8 F (36.6 C)-98.3 F (36.8 C)] 97.8 F (36.6 C) (05/19 0500) Pulse Rate:  [74-76] 74 (05/19 0500) Resp:  [18] 18 (05/19 0500) BP: (103-113)/(49-72) 112/72 mmHg (05/19 0500) SpO2:  [96 %] 96 % (05/19 0500) Weight:  [48.807 kg (107 lb 9.6 oz)] 48.807 kg (107 lb 9.6 oz) (05/19 0500)  Intake/Output from previous day: 05/18 0701 - 05/19 0700 In: 463 [I.V.:3; IV Piggyback:100] Out: -  Intake/Output from this shift:    Physical Exam: Neck: no adenopathy, no carotid bruit, no JVD and supple, symmetrical, trachea midline Lungs: clear to auscultation bilaterally Heart: regular rate and rhythm, S1, S2 normal and Soft systolic murmur noted no S3 gallop Abdomen: soft, non-tender; bowel sounds normal; no masses,  no organomegaly Extremities: extremities normal, atraumatic, no cyanosis or edema  Lab Results: No results found for this basename: WBC, HGB, PLT,  in the last 72 hours  Recent Labs  03/20/14 0504  NA 138  K 4.8  CL 103  CO2 22  GLUCOSE 234*  BUN 48*  CREATININE 1.81*   No results found for this basename: TROPONINI, CK, MB,  in the last 72 hours Hepatic Function Panel No results found for this basename: PROT, ALBUMIN, AST, ALT, ALKPHOS, BILITOT, BILIDIR, IBILI,  in the last 72 hours No results found for this basename: CHOL,  in the last 72 hours No results found for this basename: PROTIME,  in the last 72 hours  Imaging: Imaging results have been reviewed and Nm Myocar Multi W/spect W/wall Motion / Ef  03/21/2014   CLINICAL DATA:  Chest pain.  History of pacemaker.  EXAM: MYOCARDIAL IMAGING WITH SPECT (REST AND PHARMACOLOGIC-STRESS)  GATED LEFT VENTRICULAR WALL MOTION STUDY  LEFT  VENTRICULAR EJECTION FRACTION  TECHNIQUE: Standard myocardial SPECT imaging was performed after resting intravenous injection of 10 mCi Tc-31m sestamibi. Subsequently, intravenous infusion of Lexiscan was performed under the supervision of the Cardiology staff. At peak effect of the drug, 30 mCi Tc-53m sestamibi was injected intravenously and standard myocardial SPECT imaging was performed. Quantitative gated imaging was also performed to evaluate left ventricular wall motion, and estimate left ventricular ejection fraction.  COMPARISON:  Chest radiographs 03/15/2014. Nuclear medicine myocardial imaging 08/02/2004.  FINDINGS: SPECT images demonstrate a progressive fixed perfusion defect involving the apex and distal anterior septal segments. There are no reversible perfusion defects.  Gated images were reviewed at the workstation and demonstrate apical hypokinesis with paradoxical motion involving the distal septum. The QGS ejection fraction calculated at rest is 39% with an end-diastolic volume of 17PZ and an end-systolic volume of 02HE. The ejection fraction on the prior examination was 62%.  IMPRESSION: 1. Progressive apical hypokinesis with paradoxical motion in the distal septum and calculated ejection fraction of 39%. 2. Fixed perfusion defect involving the apex and distal anteroseptal segments consistent with myocardial scar. No evidence of peri-infarct ischemia.   Electronically Signed   By: Camie Patience M.D.   On: 03/21/2014 16:41    Cardiac Studies:  Assessment/Plan:    LOS: 7 days  Resolving Escherichia coli UTI  Coronary artery disease history of questionable anteroseptal wall myocardial infarction in the past  Dilated cardiomyopathy with progressive worsening systolic function multifactorial i.e. tachycardia induced/ valvular heart  disease/questionable ischemic heart disease  Compensated systolic/diastolic heart failure  Valvular heart disease  History of tachybradycardia syndrome/history  of atrial fibrillation and atrial tachyarrhythmias  Chronic kidney disease stage III  History of syncope  History of CA of breast in the past  Lytic bony lesions of questionable significance  GERD  Degenerative joint disease with osteoporosis  Malnutrition  Plan Continue present management Will add hydralazine/isosorbide dinitrate as blood pressure tolerates Will hold off ACE inhibitor as in view of renal dysfunction.  Discussed with Dr. Caryl Comes  regarding CRT would like to see patient in the office in 2 weeks once cleared of infections to evaluate for the for CRT  I will  sign off please call if needed Okay to discharge from cardiac point of view.  Followup with me in 2 weeks  Clent Demark 03/22/2014, 8:40 AM

## 2014-03-22 NOTE — Progress Notes (Signed)
Spoke with Dr. Daleen Bo as poor renal function and will not be adequately treated with Nitrofurantion.  Antibiotic changed to Fosfomycin 3g x1 dose prior to discharge as already received 6 days of Imipenem for ESBL Ecoli UTI.   Discharge orders updated to reflect.  RN alerted.  Sloan Leiter, PharmD, BCPS Clinical Pharmacist [phone] 03/22/2014, 10:34 AM

## 2014-03-24 ENCOUNTER — Non-Acute Institutional Stay (SKILLED_NURSING_FACILITY): Payer: Medicare Other | Admitting: Internal Medicine

## 2014-03-24 ENCOUNTER — Encounter: Payer: Self-pay | Admitting: Internal Medicine

## 2014-03-24 DIAGNOSIS — M899 Disorder of bone, unspecified: Secondary | ICD-10-CM

## 2014-03-24 DIAGNOSIS — I509 Heart failure, unspecified: Secondary | ICD-10-CM

## 2014-03-24 DIAGNOSIS — E119 Type 2 diabetes mellitus without complications: Secondary | ICD-10-CM

## 2014-03-24 DIAGNOSIS — I5022 Chronic systolic (congestive) heart failure: Secondary | ICD-10-CM

## 2014-03-24 DIAGNOSIS — M898X9 Other specified disorders of bone, unspecified site: Secondary | ICD-10-CM

## 2014-03-24 DIAGNOSIS — N390 Urinary tract infection, site not specified: Secondary | ICD-10-CM

## 2014-03-24 DIAGNOSIS — I495 Sick sinus syndrome: Secondary | ICD-10-CM

## 2014-03-24 DIAGNOSIS — M949 Disorder of cartilage, unspecified: Secondary | ICD-10-CM

## 2014-03-25 ENCOUNTER — Encounter: Payer: Self-pay | Admitting: Internal Medicine

## 2014-03-25 DIAGNOSIS — M899 Disorder of bone, unspecified: Secondary | ICD-10-CM | POA: Insufficient documentation

## 2014-03-25 NOTE — Assessment & Plan Note (Signed)
Recent A1c was 8.7  -hypoglycemic episode decreased glipizide to 2.5; monitor OP

## 2014-03-25 NOTE — Assessment & Plan Note (Signed)
noted on CT scan head last admission and again this admission  -per hematology evaluation unlikely MM, since polyclonal on protein electrophoresis; recommended OP f/u

## 2014-03-25 NOTE — Assessment & Plan Note (Signed)
underlying PAF s/p PPM; episode of brady  -per Dr. Jens Som ? Need to replace PPM; recommended to hold amiodarone, and f/u in 2 weeks as OP

## 2014-03-25 NOTE — Progress Notes (Signed)
MRN: [id number] Name: Sophia Oliver  Sex: female Age: 80 y.o. DOB: [birth year]  [place] #: [name] Facility/Room: 308 Level Of Care: SNF Provider: Hennie Duos Emergency Contacts: Extended Emergency Contact Information Primary Emergency Contact: Seelhorst,Bill Address: [address]          [place], [place] [address] [place] of [place] Phone: [phone] Relation: Son Secondary Emergency Contact: Kasal,Robert Address: [address]          [place], [place] [address] [place] Phone: [phone] Relation: Spouse  Code Status: FULL  Allergies: Codeine and Other  Chief Complaint  Patient presents with  . nursing home admission    HPI: Patient is 80 y.o. female who is admitted for generalized weakness for OT/PT after tx in hospital for UTI and PPM that needs to be replaced.  Past Medical History  Diagnosis Date  . Syncope   . Sinus node dysfunction     a. s/p pacemaker.  . Wide-complex tachycardia   . Cholelithiasis   . Glucose intolerance (impaired glucose tolerance)   . Anxiety   . PONV (postoperative nausea and vomiting)   . Chronic kidney disease     a. Baseline Cr reported 1.8-2.0.  . Injury of right upper arm     after venipuncture in 3/13 - pt describes pain afterwards- no followup done seen by PCP- no further treatment   . PAF (paroxysmal atrial fibrillation)   . Left bundle branch block   . Tachycardia-bradycardia syndrome   . CHF (congestive heart failure)     a. EF 30-35% by echo 2015.  [name] Pacemaker     Guidant, implanted 2006  . Type II diabetes mellitus     "recently dx'd" (03/09/2014)  . Breast cancer 2003    "radiation on right; mastectomy on the left"  . Hypertension   . GERD (gastroesophageal reflux disease)   . DJD (degenerative joint disease)   . Arthritis     "real bad in my lower back" (03/09/2014)  . Chronic lower back pain   . Orthostatic hypotension     a. Diuretics d/c'd 03/2014.  [name] PAT (paroxysmal atrial  tachycardia)     Past Surgical History  Procedure Laterality Date  . Replacement total knee bilateral Bilateral   . Total shoulder replacement    . Tonsillectomy    . Orif distal femur fracture Right 12/2007  . Tonsillectomy    . Mastectomy Left 2003  . Total hip arthroplasty  06/16/2012    Procedure: TOTAL HIP ARTHROPLASTY;  Surgeon: Mauri Pole, MD;  Location: [facility];  Service: Orthopedics;  Laterality: Left;  . Hip closed reduction  06/30/2012    Procedure: CLOSED MANIPULATION HIP;  Surgeon: Mauri Pole, MD;  Location: [facility];  Service: Orthopedics;  Laterality: Left;  . Abdominal hysterectomy    . Pacemaker insertion      Guidant Insignia I  . Cataract extraction w/ intraocular lens  implant, bilateral Bilateral 2000's  . Refractive surgery Bilateral 01/2014  . Inguinal hernia repair Left     "had emergency OR for it a few years ago" (03/09/2014)  . Joint replacement    . Breast biopsy Bilateral       Medication List       This list is accurate as of: 03/24/14 11:59 PM.  Always use your most recent med list.               acetaminophen 325 MG tablet  Commonly known as:  TYLENOL  Take 325 mg by mouth every 6 (six) hours as needed for pain.     ALPRAZolam 0.25 MG tablet  Commonly known as:  XANAX  Take 1 tablet (0.25 mg total) by mouth 2 (two) times daily.     B-complex with vitamin C tablet  Take 1 tablet by mouth daily.     bacitracin-polymyxin b ointment  Commonly known as:  POLYSPORIN  Apply 1 application topically 2 (two) times daily. Apply twice daily to ear lobes.     dabigatran 75 MG Caps capsule  Commonly known as:  PRADAXA  Take 75 mg by mouth 2 (two) times daily.     diclofenac sodium 1 % Gel  Commonly known as:  VOLTAREN  Apply 2 g topically 4 (four) times daily.     furosemide 20 MG tablet  Commonly known as:  LASIX  Take 1 tablet (20 mg total) by mouth every other day.     glipiZIDE 5 MG tablet  Commonly known as:  GLUCOTROL  Take 0.5  tablets (2.5 mg total) by mouth daily before breakfast.     metoprolol 50 MG tablet  Commonly known as:  LOPRESSOR  Take 1 tablet (50 mg total) by mouth 2 (two) times daily.     pantoprazole 40 MG tablet  Commonly known as:  PROTONIX  Take 40 mg by mouth daily.        No orders of the defined types were placed in this encounter.     There is no immunization history on file for this patient.  History  Substance Use Topics  . Smoking status: Former Smoker -- 0.50 packs/day for 6 years    Types: Cigarettes  . Smokeless tobacco: Never Used  . Alcohol Use: Yes     Comment: "I'll have a social drink maybe at [employer]"    Family history is noncontributory    Review of Systems  DATA OBTAINED: from patient; no c/o GENERAL: Feels well no fevers, fatigue, appetite changes SKIN: No itching, rash or wounds EYES: No eye pain, redness, discharge EARS: No earache, tinnitus, change in hearing NOSE: No congestion, drainage or bleeding  MOUTH/THROAT: No mouth or tooth pain, No sore throat, No difficulty chewing or swallowing  RESPIRATORY: No cough, wheezing, SOB CARDIAC: No chest pain, palpitations, lower extremity edema  GI: No abdominal pain, No N/V/D or constipation, No heartburn or reflux  GU: No dysuria, frequency or urgency, or incontinence  MUSCULOSKELETAL: No unrelieved bone/joint pain NEUROLOGIC: No headache, dizziness or focal weakness PSYCHIATRIC: No overt anxiety or sadness. Sleeps well. No behavior issue.   Filed Vitals:   03/24/14 1921  BP: 100/62  Pulse: 88  Temp: 97.3 F (36.3 C)  Resp: 20    Physical Exam  GENERAL APPEARANCE: Alert, conversant. Appropriately groomed. No acute distress.  SKIN: No diaphoresis rash HEAD: Normocephalic, atraumatic  EYES: Conjunctiva/lids clear. Pupils round, reactive. EOMs intact.  EARS: External exam WNL, canals clear. Hearing grossly normal.  NOSE: No deformity or discharge.  MOUTH/THROAT: Lips w/o lesions   RESPIRATORY: Breathing is even, unlabored. Lung sounds are clear   CARDIOVASCULAR: Heart RRR no murmurs, rubs or gallops. No peripheral edema.  GASTROINTESTINAL: Abdomen is soft, non-tender, not distended w/ normal bowel sounds GENITOURINARY: Bladder non tender, not distended  MUSCULOSKELETAL: No abnormal joints or musculature NEUROLOGIC: Oriented X3. Cranial nerves 2-12 grossly intact. Moves all extremities no tremor. PSYCHIATRIC: Mood and affect appropriate to situation, no behavioral issues  Patient Active Problem List   Diagnosis Date  Noted  . Lytic bone lesions on xray 03/25/2014  . Syncope 03/09/2014  . Type II or unspecified type diabetes mellitus without mention of complication, not stated as uncontrolled 01/21/2014  . Sepsis 01/19/2014  . Chronic systolic CHF (congestive heart failure) 01/18/2014  . General weakness 01/18/2014  . Chronic kidney disease   . PAF (paroxysmal atrial fibrillation)   . Acute heart failure 12/01/2013  . Tachy-brady syndrome 01/29/2013  . Hyperkalemia 11/23/2012  . CKD (chronic kidney disease), stage III 11/19/2012  . UTI (lower urinary tract infection) 11/19/2012  . Pacemaker 11/19/2012  . Pacemaker lead failure 04/23/2011  . Atrial fibrillation 01/08/2011  . Syncope and collapse 11/29/2008    CBC    Component Value Date/Time   WBC 11.8* 03/16/2014 0400   RBC 3.21* 03/16/2014 0400   HGB 9.9* 03/16/2014 0400   HCT 30.5* 03/16/2014 0400   PLT 188 03/16/2014 0400   MCV 95.0 03/16/2014 0400   LYMPHSABS 0.4* 03/15/2014 1228   MONOABS 0.4 03/15/2014 1228   EOSABS 0.0 03/15/2014 1228   BASOSABS 0.0 03/15/2014 1228    CMP     Component Value Date/Time   NA 138 03/20/2014 0504   K 4.8 03/20/2014 0504   CL 103 03/20/2014 0504   CO2 22 03/20/2014 0504   GLUCOSE 234* 03/20/2014 0504   BUN 48* 03/20/2014 0504   CREATININE 1.81* 03/20/2014 0504   CALCIUM 8.6 03/20/2014 0504   PROT 5.4* 03/15/2014 1228   ALBUMIN 2.8* 03/15/2014 1228   AST 20 03/15/2014 1228    ALT 30 03/15/2014 1228   ALKPHOS 103 03/15/2014 1228   BILITOT 0.7 03/15/2014 1228   GFRNONAA 24* 03/20/2014 0504   GFRAA 28* 03/20/2014 0504    Assessment and Plan  UTI (lower urinary tract infection) neuro exam no focal; recurrent UTI;  -improved on IV atx, afebrile, no leukocytosis; blood cultures NGTD; urine E coli; initially was on IV zosyn, atx changed to imipenem, completed 6 days IV; S/p fosfomycin x 1  -no new episodes of fall; needs SNF    Tachy-brady syndrome underlying PAF s/p PPM; episode of brady  -per Dr. Jens Som ? Need to replace PPM; recommended to hold amiodarone, and f/u in 2 weeks as OP    Type II or unspecified type diabetes mellitus without mention of complication, not stated as uncontrolled Recent A1c was 8.7  -hypoglycemic episode decreased glipizide to 2.5; monitor OP    Chronic systolic CHF (congestive heart failure) LVEF 35% on 12/2013-->20 % (03/2014), dilated cardiomyopathy, pulmonary HTN, Dyskinesis of the basal-midanteroseptal myocardium  -Pt was evaluated by cardiologist Dr. Sarina Ser input;  -Pt may not be a good candidate for LHC with CKD;  -clinically improved; no s/s of acute fluid overload; cont current regimen, BP does not tolerate hydralazine/ntg at this time; improved on lasix, BB; ACE on hold due to AKI   Lytic bone lesions on xray noted on CT scan head last admission and again this admission  -per hematology evaluation unlikely MM, since polyclonal on protein electrophoresis; recommended OP f/u     Hennie Duos, MD

## 2014-03-25 NOTE — Assessment & Plan Note (Signed)
neuro exam no focal; recurrent UTI;  -improved on IV atx, afebrile, no leukocytosis; blood cultures NGTD; urine E coli; initially was on IV zosyn, atx changed to imipenem, completed 6 days IV; S/p fosfomycin x 1  -no new episodes of fall; needs SNF

## 2014-03-25 NOTE — Assessment & Plan Note (Signed)
LVEF 35% on 12/2013-->20 % (03/2014), dilated cardiomyopathy, pulmonary HTN, Dyskinesis of the basal-midanteroseptal myocardium  -Pt was evaluated by cardiologist Dr. Sarina Ser input;  -Pt may not be a good candidate for LHC with CKD;  -clinically improved; no s/s of acute fluid overload; cont current regimen, BP does not tolerate hydralazine/ntg at this time; improved on lasix, BB; ACE on hold due to AKI

## 2014-03-29 ENCOUNTER — Other Ambulatory Visit: Payer: Self-pay | Admitting: *Deleted

## 2014-03-29 MED ORDER — ALPRAZOLAM 0.25 MG PO TABS: ORAL_TABLET | ORAL | Status: DC

## 2014-03-29 NOTE — Telephone Encounter (Signed)
[facility] 

## 2014-04-13 ENCOUNTER — Non-Acute Institutional Stay (SKILLED_NURSING_FACILITY): Payer: Medicare Other | Admitting: Internal Medicine

## 2014-04-13 ENCOUNTER — Encounter: Payer: Self-pay | Admitting: Internal Medicine

## 2014-04-13 DIAGNOSIS — R55 Syncope and collapse: Secondary | ICD-10-CM

## 2014-04-13 DIAGNOSIS — N189 Chronic kidney disease, unspecified: Secondary | ICD-10-CM

## 2014-04-13 DIAGNOSIS — I5022 Chronic systolic (congestive) heart failure: Secondary | ICD-10-CM

## 2014-04-13 DIAGNOSIS — R5383 Other fatigue: Secondary | ICD-10-CM

## 2014-04-13 DIAGNOSIS — R5381 Other malaise: Secondary | ICD-10-CM

## 2014-04-13 DIAGNOSIS — E119 Type 2 diabetes mellitus without complications: Secondary | ICD-10-CM

## 2014-04-13 DIAGNOSIS — R531 Weakness: Secondary | ICD-10-CM

## 2014-04-13 DIAGNOSIS — N183 Chronic kidney disease, stage 3 unspecified: Secondary | ICD-10-CM

## 2014-04-13 DIAGNOSIS — I509 Heart failure, unspecified: Secondary | ICD-10-CM

## 2014-04-13 DIAGNOSIS — Z950 Presence of cardiac pacemaker: Secondary | ICD-10-CM

## 2014-04-13 DIAGNOSIS — I4891 Unspecified atrial fibrillation: Secondary | ICD-10-CM

## 2014-04-13 NOTE — Progress Notes (Signed)
MRN: [id number] Name: Sophia Oliver  Sex: female Age: 80 y.o. DOB: November 19, 1923  [place] #: [name] Facility/Room: 308 Level Of Care: SNF Provider: Inocencio Homes D Emergency Contacts: Extended Emergency Contact Information Primary Emergency Contact: Seelhorst,Bill Address: [address]          [place], [place] [address] Johnnette Litter of [place] Phone: [phone] Relation: Son Secondary Emergency Contact: Gellert,Robert Address: [address]          [place],  [address] Johnnette Litter of [place] Phone: [phone] Relation: Spouse  Code Status: FULL  Allergies: Codeine and Other  Chief Complaint  Patient presents with  . Discharge Note    HPI: Patient is 80 y.o. female who was admitted for generalized weakness after a UTI who is now ready to be discharged.  Past Medical History  Diagnosis Date  . Syncope   . Sinus node dysfunction     a. s/p pacemaker.  . Wide-complex tachycardia   . Cholelithiasis   . Glucose intolerance (impaired glucose tolerance)   . Anxiety   . PONV (postoperative nausea and vomiting)   . Chronic kidney disease     a. Baseline Cr reported 1.8-2.0.  . Injury of right upper arm     after venipuncture in 3/13 - pt describes pain afterwards- no followup done seen by PCP- no further treatment   . PAF (paroxysmal atrial fibrillation)   . Left bundle branch block   . Tachycardia-bradycardia syndrome   . CHF (congestive heart failure)     a. EF 30-35% by echo 2015.  [name] Pacemaker     Guidant, implanted 2006  . Type II diabetes mellitus     "recently dx'd" (03/09/2014)  . Breast cancer 2003    "radiation on right; mastectomy on the left"  . Hypertension   . GERD (gastroesophageal reflux disease)   . DJD (degenerative joint disease)   . Arthritis     "real bad in my lower back" (03/09/2014)  . Chronic lower back pain   . Orthostatic hypotension     a. Diuretics d/c'd 03/2014.  [name] PAT (paroxysmal atrial tachycardia)     Past  Surgical History  Procedure Laterality Date  . Replacement total knee bilateral Bilateral   . Total shoulder replacement    . Tonsillectomy    . Orif distal femur fracture Right 12/2007  . Tonsillectomy    . Mastectomy Left 2003  . Total hip arthroplasty  06/16/2012    Procedure: TOTAL HIP ARTHROPLASTY;  Surgeon: Mauri Pole, MD;  Location: [facility];  Service: Orthopedics;  Laterality: Left;  . Hip closed reduction  06/30/2012    Procedure: CLOSED MANIPULATION HIP;  Surgeon: Mauri Pole, MD;  Location: [facility];  Service: Orthopedics;  Laterality: Left;  . Abdominal hysterectomy    . Pacemaker insertion      Guidant Insignia I  . Cataract extraction w/ intraocular lens  implant, bilateral Bilateral 2000's  . Refractive surgery Bilateral 01/2014  . Inguinal hernia repair Left     "had emergency OR for it a few years ago" (03/09/2014)  . Joint replacement    . Breast biopsy Bilateral       Medication List       This list is accurate as of: 04/13/14  5:07 PM.  Always use your most recent med list.               acetaminophen 325 MG tablet  Commonly known as:  TYLENOL  Take 325 mg  by mouth every 6 (six) hours as needed for pain.     ALPRAZolam 0.25 MG tablet  Commonly known as:  XANAX  Take one tablet by mouth twice daily for anxiety     B-complex with vitamin C tablet  Take 1 tablet by mouth daily.     dabigatran 75 MG Caps capsule  Commonly known as:  PRADAXA  Take 75 mg by mouth 2 (two) times daily.     diclofenac sodium 1 % Gel  Commonly known as:  VOLTAREN  Apply 2 g topically 4 (four) times daily.     furosemide 20 MG tablet  Commonly known as:  LASIX  Take 1 tablet (20 mg total) by mouth every other day.     glipiZIDE 5 MG tablet  Commonly known as:  GLUCOTROL  Take 0.5 tablets (2.5 mg total) by mouth daily before breakfast.     metoprolol 50 MG tablet  Commonly known as:  LOPRESSOR  Take 1 tablet (50 mg total) by mouth 2 (two) times daily.      pantoprazole 40 MG tablet  Commonly known as:  PROTONIX  Take 40 mg by mouth daily.        No orders of the defined types were placed in this encounter.     There is no immunization history on file for this patient.  History  Substance Use Topics  . Smoking status: Former Smoker -- 0.50 packs/day for 6 years    Types: Cigarettes  . Smokeless tobacco: Never Used  . Alcohol Use: Yes     Comment: "I'll have a social drink maybe at [employer] Vitals:   04/13/14 1654  BP: 105/64  Pulse: 84  Temp: 97.6 F (36.4 C)  Resp: 20    Physical Exam  GENERAL APPEARANCE: Alert, conversant. Appropriately groomed. No acute distress.  HEENT: Unremarkable. RESPIRATORY: Breathing is even, unlabored. Lung sounds are clear   CARDIOVASCULAR: Heart RRR no murmurs, rubs or gallops. No peripheral edema.  GASTROINTESTINAL: Abdomen is soft, non-tender, not distended w/ normal bowel sounds.  NEUROLOGIC: Cranial nerves 2-12 grossly intact. Moves all extremities no tremor.  Patient Active Problem List   Diagnosis Date Noted  . Lytic bone lesions on xray 03/25/2014  . Syncope 03/09/2014  . Type II or unspecified type diabetes mellitus without mention of complication, not stated as uncontrolled 01/21/2014  . Sepsis 01/19/2014  . Chronic systolic CHF (congestive heart failure) 01/18/2014  . General weakness 01/18/2014  . Chronic kidney disease   . PAF (paroxysmal atrial fibrillation)   . Acute heart failure 12/01/2013  . Tachy-brady syndrome 01/29/2013  . Hyperkalemia 11/23/2012  . CKD (chronic kidney disease), stage III 11/19/2012  . UTI (lower urinary tract infection) 11/19/2012  . Pacemaker 11/19/2012  . Pacemaker lead failure 04/23/2011  . Atrial fibrillation 01/08/2011  . Syncope and collapse 11/29/2008    CBC    Component Value Date/Time   WBC 11.8* 03/16/2014 0400   RBC 3.21* 03/16/2014 0400   HGB 9.9* 03/16/2014 0400   HCT 30.5* 03/16/2014 0400   PLT 188 03/16/2014  0400   MCV 95.0 03/16/2014 0400   LYMPHSABS 0.4* 03/15/2014 1228   MONOABS 0.4 03/15/2014 1228   EOSABS 0.0 03/15/2014 1228   BASOSABS 0.0 03/15/2014 1228    CMP     Component Value Date/Time   NA 138 03/20/2014 0504   K 4.8 03/20/2014 0504   CL 103 03/20/2014 0504   CO2 22 03/20/2014 0504   GLUCOSE  234* 03/20/2014 0504   BUN 48* 03/20/2014 0504   CREATININE 1.81* 03/20/2014 0504   CALCIUM 8.6 03/20/2014 0504   PROT 5.4* 03/15/2014 1228   ALBUMIN 2.8* 03/15/2014 1228   AST 20 03/15/2014 1228   ALT 30 03/15/2014 1228   ALKPHOS 103 03/15/2014 1228   BILITOT 0.7 03/15/2014 1228   GFRNONAA 24* 03/20/2014 0504   GFRAA 28* 03/20/2014 0504    Assessment and Plan  Pt is stable and improved for discharge to home with her husband.  Hennie Duos, MD

## 2014-04-14 ENCOUNTER — Encounter: Payer: Self-pay | Admitting: Internal Medicine

## 2014-04-14 ENCOUNTER — Ambulatory Visit (INDEPENDENT_AMBULATORY_CARE_PROVIDER_SITE_OTHER): Payer: Medicare Other | Admitting: Internal Medicine

## 2014-04-14 VITALS — BP 111/67 | HR 85 | Ht 64.5 in | Wt 106.0 lb

## 2014-04-14 DIAGNOSIS — I495 Sick sinus syndrome: Secondary | ICD-10-CM

## 2014-04-14 DIAGNOSIS — I480 Paroxysmal atrial fibrillation: Secondary | ICD-10-CM

## 2014-04-14 DIAGNOSIS — I4891 Unspecified atrial fibrillation: Secondary | ICD-10-CM

## 2014-04-14 DIAGNOSIS — I447 Left bundle-branch block, unspecified: Secondary | ICD-10-CM

## 2014-04-14 DIAGNOSIS — Z950 Presence of cardiac pacemaker: Secondary | ICD-10-CM

## 2014-04-14 LAB — MDC_IDC_ENUM_SESS_TYPE_INCLINIC
Brady Statistic RA Percent Paced: 13 %
Brady Statistic RV Percent Paced: 11 %
Lead Channel Impedance Value: 2500 Ohm
Lead Channel Impedance Value: 420 Ohm
Lead Channel Pacing Threshold Amplitude: 3.4 V
Lead Channel Sensing Intrinsic Amplitude: 7.8 mV
Lead Channel Setting Pacing Amplitude: 5 V
Lead Channel Setting Sensing Sensitivity: 2.5 mV
MDC IDC MSMT LEADCHNL RA SENSING INTR AMPL: 0.3 mV
MDC IDC MSMT LEADCHNL RV PACING THRESHOLD PULSEWIDTH: 1 ms
MDC IDC PG SERIAL: 313770
MDC IDC SET LEADCHNL RA PACING AMPLITUDE: 2 V
MDC IDC SET LEADCHNL RV PACING PULSEWIDTH: 1 ms

## 2014-04-14 NOTE — Patient Instructions (Signed)
Your physician recommends that you continue on your current medications as directed. Please refer to the Current Medication list given to you today.  Your physician wants you to follow-up in: 6 months with Dr. Klein. You will receive a reminder letter in the mail two months in advance. If you don't receive a letter, please call our office to schedule the follow-up appointment.  

## 2014-04-14 NOTE — Progress Notes (Signed)
[facility]      Patient Care Team: [name], MD as PCP - General (Internal Medicine)   HPI  Sophia Oliver is a 80 y.o. female seen in followup for a pacemaker implanted for sinus node dysfunction and syncope  She has over recent months noted significant deterioration in systolic function. This dates back to mid December. It is characterized by    dyspnea nocturnal dyspnea and orthopnea and peripheral edema. She was thought to be in atrial fibrillation  She was hospitalized in May 2015 for congestive heart failure. Amiodarone was discontinued because of concerns of neurotoxicity.   Evaluation of left ventricular systolic function at that time had shown an interval decrease from 35-15%  She has been in rehabilitation because of her knee. She comes in now without complaints of shortness of breath. Her tremor is much better following the discontinuation of her amiodarone as well as the treatment of her UTI   Past Medical History  Diagnosis Date  . Syncope   . Sinus node dysfunction     a. s/p pacemaker.  . Wide-complex tachycardia   . Cholelithiasis   . Glucose intolerance (impaired glucose tolerance)   . Anxiety   . PONV (postoperative nausea and vomiting)   . Chronic kidney disease     a. Baseline Cr reported 1.8-2.0.  . Injury of right upper arm     after venipuncture in 3/13 - pt describes pain afterwards- no followup done seen by PCP- no further treatment   . PAF (paroxysmal atrial fibrillation)   . Left bundle branch block   . Tachycardia-bradycardia syndrome   . CHF (congestive heart failure)     a. EF 30-35% by echo 2015.  [name] Pacemaker     Guidant, implanted 2006  . Type II diabetes mellitus     "recently dx'd" (03/09/2014)  . Breast cancer 2003    "radiation on right; mastectomy on the left"  . Hypertension   . GERD (gastroesophageal reflux disease)   . DJD (degenerative joint disease)   . Arthritis     "real bad in my lower back" (03/09/2014)  . Chronic lower back pain    . Orthostatic hypotension     a. Diuretics d/c'd 03/2014.  [name] PAT (paroxysmal atrial tachycardia)     Past Surgical History  Procedure Laterality Date  . Replacement total knee bilateral Bilateral   . Total shoulder replacement    . Tonsillectomy    . Orif distal femur fracture Right 12/2007  . Tonsillectomy    . Mastectomy Left 2003  . Total hip arthroplasty  06/16/2012    Procedure: TOTAL HIP ARTHROPLASTY;  Surgeon: Mauri Pole, MD;  Location: [facility];  Service: Orthopedics;  Laterality: Left;  . Hip closed reduction  06/30/2012    Procedure: CLOSED MANIPULATION HIP;  Surgeon: Mauri Pole, MD;  Location: [facility];  Service: Orthopedics;  Laterality: Left;  . Abdominal hysterectomy    . Pacemaker insertion      Guidant Insignia I  . Cataract extraction w/ intraocular lens  implant, bilateral Bilateral 2000's  . Refractive surgery Bilateral 01/2014  . Inguinal hernia repair Left     "had emergency OR for it a few years ago" (03/09/2014)  . Joint replacement    . Breast biopsy Bilateral     Current Outpatient Prescriptions  Medication Sig Dispense Refill  . acetaminophen (TYLENOL) 325 MG tablet Take 325 mg by mouth every 6 (six) hours as needed for pain.      [name]  ALPRAZolam (XANAX) 0.25 MG tablet Take one tablet by mouth twice daily for anxiety  60 tablet  5  . B Complex-C (B-COMPLEX WITH VITAMIN C) tablet Take 1 tablet by mouth daily.      . dabigatran (PRADAXA) 75 MG CAPS Take 75 mg by mouth 2 (two) times daily.   60 capsule    . diclofenac sodium (VOLTAREN) 1 % GEL Apply 2 g topically 4 (four) times daily.  1 Tube  0  . furosemide (LASIX) 20 MG tablet Take 1 tablet (20 mg total) by mouth every other day.  30 tablet  0  . glipiZIDE (GLUCOTROL) 5 MG tablet Take 0.5 tablets (2.5 mg total) by mouth daily before breakfast.  30 tablet  0  . metoprolol (LOPRESSOR) 50 MG tablet Take 1 tablet (50 mg total) by mouth 2 (two) times daily.  60 tablet  6  . pantoprazole (PROTONIX) 40 MG tablet  Take 40 mg by mouth daily.       No current facility-administered medications for this visit.    Allergies  Allergen Reactions  . Codeine Nausea And Vomiting  . Hydrocodone-Acetaminophen Itching  . Other Nausea And Vomiting    Narcotics  dizziness    Review of Systems negative except from HPI and PMH  Physical Exam BP 111/67  Pulse 85  Ht 5' 4.5" (1.638 m)  Wt 106 lb (48.081 kg)  BMI 17.92 kg/m2 Well developed and well nourished in no acute distress HENT normal E scleral and icterus clear Neck Supple   Clear to ausculation  Regular rate and rhythm, no murmurs gallops or rub Soft with active bowel sounds No clubbing cyanosis  Edema Alert and oriented, grossly normal motor and sensory function Skin Warm and Dry  ECG demonstrates atrial fibrillation at a ventricular rate of 85 intervals-/13/44  Assessment and  Plan  Atrial fibrillation-persistent  Cardiomyopathy-nonischemic  Congestive heart failure-chronic-systolic   Left bundle branch block  Pacemaker-[employer] The patient's device was interrogated.  The information was reviewed. No changes were made in the programming.     The patient is doing amazingly well and is currently without complaints of shortness of breath or volume overload. At this juncture then, I would not like to undertake the procedure for resynchronization. We will see her in 6 months.

## 2014-04-15 ENCOUNTER — Encounter: Payer: Self-pay | Admitting: Internal Medicine

## 2014-04-22 ENCOUNTER — Telehealth: Payer: Self-pay | Admitting: Internal Medicine

## 2014-04-22 MED ORDER — FUROSEMIDE 20 MG PO TABS: 20.0000 mg | ORAL_TABLET | ORAL | Status: DC

## 2014-04-22 NOTE — Telephone Encounter (Signed)
PER DR NELSON   INCREASE LASIX  TO EVERY DAY    SEE  DR  HAWANI NEXT  WEEK   AND  HAVE  REPEAT BMET  CALLED  PT  WITH  INSTRUCTIONS PER  PT   HAS NOT  BEEN TAKING LASIX  QOD  PT   INSTRUCTED  TO  TAKE   EVERY OTHER  DAY   AND TO  SEE DR Trinway  AND ALSO THE  NEED TO HAVE  REPEAT  BMET .Adonis Housekeeper

## 2014-04-22 NOTE — Telephone Encounter (Signed)
New message     Pt saw urologist today and her ankles were swollen.  Her ankles are doubled in size.  She started swelling 2 days ago.  Please advise.  Patient has a pacemaker but Dr Lyla Son is her cardiologist but he is out of the office.

## 2014-04-23 DIAGNOSIS — M6281 Muscle weakness (generalized): Secondary | ICD-10-CM

## 2014-04-23 DIAGNOSIS — Z5189 Encounter for other specified aftercare: Secondary | ICD-10-CM

## 2014-04-23 DIAGNOSIS — I5022 Chronic systolic (congestive) heart failure: Secondary | ICD-10-CM

## 2014-04-23 DIAGNOSIS — N183 Chronic kidney disease, stage 3 unspecified: Secondary | ICD-10-CM

## 2014-04-23 DIAGNOSIS — R269 Unspecified abnormalities of gait and mobility: Secondary | ICD-10-CM

## 2014-04-23 DIAGNOSIS — E119 Type 2 diabetes mellitus without complications: Secondary | ICD-10-CM

## 2014-05-23 ENCOUNTER — Encounter (HOSPITAL_COMMUNITY): Payer: Self-pay | Admitting: Emergency Medicine

## 2014-05-23 ENCOUNTER — Inpatient Hospital Stay (HOSPITAL_COMMUNITY)
Admission: EM | Admit: 2014-05-23 | Discharge: 2014-05-30 | DRG: 193 | Disposition: A | Discharge status: Skilled Nursing Facility | Payer: Medicare Other | Attending: Internal Medicine | Admitting: Internal Medicine

## 2014-05-23 ENCOUNTER — Emergency Department (HOSPITAL_COMMUNITY): Payer: Medicare Other

## 2014-05-23 DIAGNOSIS — N183 Chronic kidney disease, stage 3 unspecified: Secondary | ICD-10-CM | POA: Diagnosis present

## 2014-05-23 DIAGNOSIS — R5381 Other malaise: Secondary | ICD-10-CM | POA: Diagnosis present

## 2014-05-23 DIAGNOSIS — Z79899 Other long term (current) drug therapy: Secondary | ICD-10-CM | POA: Diagnosis not present

## 2014-05-23 DIAGNOSIS — R5383 Other fatigue: Secondary | ICD-10-CM

## 2014-05-23 DIAGNOSIS — K219 Gastro-esophageal reflux disease without esophagitis: Secondary | ICD-10-CM | POA: Diagnosis present

## 2014-05-23 DIAGNOSIS — E1169 Type 2 diabetes mellitus with other specified complication: Secondary | ICD-10-CM | POA: Diagnosis present

## 2014-05-23 DIAGNOSIS — Z681 Body mass index (BMI) 19 or less, adult: Secondary | ICD-10-CM | POA: Diagnosis not present

## 2014-05-23 DIAGNOSIS — I5043 Acute on chronic combined systolic (congestive) and diastolic (congestive) heart failure: Secondary | ICD-10-CM | POA: Diagnosis present

## 2014-05-23 DIAGNOSIS — E875 Hyperkalemia: Secondary | ICD-10-CM | POA: Diagnosis present

## 2014-05-23 DIAGNOSIS — Z885 Allergy status to narcotic agent status: Secondary | ICD-10-CM | POA: Diagnosis not present

## 2014-05-23 DIAGNOSIS — Z96649 Presence of unspecified artificial hip joint: Secondary | ICD-10-CM

## 2014-05-23 DIAGNOSIS — D62 Acute posthemorrhagic anemia: Secondary | ICD-10-CM | POA: Diagnosis present

## 2014-05-23 DIAGNOSIS — E119 Type 2 diabetes mellitus without complications: Secondary | ICD-10-CM | POA: Diagnosis present

## 2014-05-23 DIAGNOSIS — D500 Iron deficiency anemia secondary to blood loss (chronic): Secondary | ICD-10-CM | POA: Diagnosis present

## 2014-05-23 DIAGNOSIS — Z950 Presence of cardiac pacemaker: Secondary | ICD-10-CM | POA: Diagnosis not present

## 2014-05-23 DIAGNOSIS — Z96619 Presence of unspecified artificial shoulder joint: Secondary | ICD-10-CM

## 2014-05-23 DIAGNOSIS — I959 Hypotension, unspecified: Secondary | ICD-10-CM | POA: Diagnosis present

## 2014-05-23 DIAGNOSIS — M545 Low back pain, unspecified: Secondary | ICD-10-CM | POA: Diagnosis present

## 2014-05-23 DIAGNOSIS — E440 Moderate protein-calorie malnutrition: Secondary | ICD-10-CM | POA: Diagnosis present

## 2014-05-23 DIAGNOSIS — G8929 Other chronic pain: Secondary | ICD-10-CM | POA: Diagnosis present

## 2014-05-23 DIAGNOSIS — Z87891 Personal history of nicotine dependence: Secondary | ICD-10-CM | POA: Diagnosis not present

## 2014-05-23 DIAGNOSIS — I447 Left bundle-branch block, unspecified: Secondary | ICD-10-CM | POA: Diagnosis present

## 2014-05-23 DIAGNOSIS — Z853 Personal history of malignant neoplasm of breast: Secondary | ICD-10-CM | POA: Diagnosis not present

## 2014-05-23 DIAGNOSIS — N189 Chronic kidney disease, unspecified: Secondary | ICD-10-CM | POA: Diagnosis present

## 2014-05-23 DIAGNOSIS — N184 Chronic kidney disease, stage 4 (severe): Secondary | ICD-10-CM

## 2014-05-23 DIAGNOSIS — N390 Urinary tract infection, site not specified: Secondary | ICD-10-CM | POA: Diagnosis present

## 2014-05-23 DIAGNOSIS — E8770 Fluid overload, unspecified: Secondary | ICD-10-CM | POA: Diagnosis not present

## 2014-05-23 DIAGNOSIS — I480 Paroxysmal atrial fibrillation: Secondary | ICD-10-CM | POA: Diagnosis present

## 2014-05-23 DIAGNOSIS — E871 Hypo-osmolality and hyponatremia: Secondary | ICD-10-CM | POA: Diagnosis present

## 2014-05-23 DIAGNOSIS — Z8249 Family history of ischemic heart disease and other diseases of the circulatory system: Secondary | ICD-10-CM | POA: Diagnosis not present

## 2014-05-23 DIAGNOSIS — I129 Hypertensive chronic kidney disease with stage 1 through stage 4 chronic kidney disease, or unspecified chronic kidney disease: Secondary | ICD-10-CM | POA: Diagnosis present

## 2014-05-23 DIAGNOSIS — I4891 Unspecified atrial fibrillation: Secondary | ICD-10-CM | POA: Diagnosis present

## 2014-05-23 DIAGNOSIS — I5022 Chronic systolic (congestive) heart failure: Secondary | ICD-10-CM | POA: Diagnosis present

## 2014-05-23 DIAGNOSIS — F411 Generalized anxiety disorder: Secondary | ICD-10-CM | POA: Diagnosis present

## 2014-05-23 DIAGNOSIS — N179 Acute kidney failure, unspecified: Secondary | ICD-10-CM | POA: Diagnosis present

## 2014-05-23 DIAGNOSIS — J189 Pneumonia, unspecified organism: Secondary | ICD-10-CM | POA: Diagnosis not present

## 2014-05-23 DIAGNOSIS — I509 Heart failure, unspecified: Secondary | ICD-10-CM | POA: Diagnosis present

## 2014-05-23 DIAGNOSIS — Z87440 Personal history of urinary (tract) infections: Secondary | ICD-10-CM | POA: Diagnosis not present

## 2014-05-23 DIAGNOSIS — R531 Weakness: Secondary | ICD-10-CM | POA: Diagnosis present

## 2014-05-23 DIAGNOSIS — M1990 Unspecified osteoarthritis, unspecified site: Secondary | ICD-10-CM | POA: Diagnosis present

## 2014-05-23 LAB — CBC WITH DIFFERENTIAL/PLATELET
BASOS ABS: 0 10*3/uL (ref 0.0–0.1)
BASOS PCT: 0 % (ref 0–1)
EOS ABS: 0.2 10*3/uL (ref 0.0–0.7)
EOS PCT: 3 % (ref 0–5)
HCT: 35.9 % — ABNORMAL LOW (ref 36.0–46.0)
Hemoglobin: 11.8 g/dL — ABNORMAL LOW (ref 12.0–15.0)
Lymphocytes Relative: 3 % — ABNORMAL LOW (ref 12–46)
Lymphs Abs: 0.2 10*3/uL — ABNORMAL LOW (ref 0.7–4.0)
MCH: 32.3 pg (ref 26.0–34.0)
MCHC: 32.9 g/dL (ref 30.0–36.0)
MCV: 98.4 fL (ref 78.0–100.0)
Monocytes Absolute: 0.2 10*3/uL (ref 0.1–1.0)
Monocytes Relative: 3 % (ref 3–12)
NEUTROS PCT: 91 % — AB (ref 43–77)
Neutro Abs: 5.4 10*3/uL (ref 1.7–7.7)
PLATELETS: 190 10*3/uL (ref 150–400)
RBC: 3.65 MIL/uL — ABNORMAL LOW (ref 3.87–5.11)
RDW: 14.5 % (ref 11.5–15.5)
WBC: 6 10*3/uL (ref 4.0–10.5)

## 2014-05-23 LAB — COMPREHENSIVE METABOLIC PANEL
ALBUMIN: 3 g/dL — AB (ref 3.5–5.2)
ALK PHOS: 92 U/L (ref 39–117)
ALT: 84 U/L — AB (ref 0–35)
AST: 30 U/L (ref 0–37)
Anion gap: 14 (ref 5–15)
BUN: 65 mg/dL — ABNORMAL HIGH (ref 6–23)
CALCIUM: 9.4 mg/dL (ref 8.4–10.5)
CO2: 19 mEq/L (ref 19–32)
Chloride: 97 mEq/L (ref 96–112)
Creatinine, Ser: 1.89 mg/dL — ABNORMAL HIGH (ref 0.50–1.10)
GFR calc non Af Amer: 23 mL/min — ABNORMAL LOW (ref >90)
GFR, EST AFRICAN AMERICAN: 26 mL/min — AB (ref >90)
Glucose, Bld: 118 mg/dL — ABNORMAL HIGH (ref 70–99)
POTASSIUM: 5.9 meq/L — AB (ref 3.7–5.3)
SODIUM: 130 meq/L — AB (ref 137–147)
Total Bilirubin: 0.4 mg/dL (ref 0.3–1.2)
Total Protein: 6.4 g/dL (ref 6.0–8.3)

## 2014-05-23 LAB — URINALYSIS, ROUTINE W REFLEX MICROSCOPIC
BILIRUBIN URINE: NEGATIVE
Glucose, UA: NEGATIVE mg/dL
Ketones, ur: NEGATIVE mg/dL
NITRITE: NEGATIVE
Protein, ur: NEGATIVE mg/dL
SPECIFIC GRAVITY, URINE: 1.016 (ref 1.005–1.030)
Urobilinogen, UA: 1 mg/dL (ref 0.0–1.0)
pH: 6 (ref 5.0–8.0)

## 2014-05-23 LAB — LACTIC ACID, PLASMA: Lactic Acid, Venous: 1.6 mmol/L (ref 0.5–2.2)

## 2014-05-23 LAB — URINE MICROSCOPIC-ADD ON

## 2014-05-23 MED ORDER — PIPERACILLIN-TAZOBACTAM 3.375 G IVPB 30 MIN
3.3750 g | Freq: Once | INTRAVENOUS | Status: AC
  Administered 2014-05-23: 3.375 g via INTRAVENOUS
  Filled 2014-05-23: qty 50

## 2014-05-23 MED ORDER — PANTOPRAZOLE SODIUM 40 MG PO TBEC
40.0000 mg | DELAYED_RELEASE_TABLET | Freq: Every day | ORAL | Status: DC
  Administered 2014-05-24 – 2014-05-30 (×7): 40 mg via ORAL
  Filled 2014-05-23 (×7): qty 1

## 2014-05-23 MED ORDER — DICLOFENAC SODIUM 1 % TD GEL
2.0000 g | Freq: Four times a day (QID) | TRANSDERMAL | Status: DC
  Administered 2014-05-23 – 2014-05-30 (×22): 2 g via TOPICAL
  Filled 2014-05-23: qty 100

## 2014-05-23 MED ORDER — DABIGATRAN ETEXILATE MESYLATE 75 MG PO CAPS
75.0000 mg | ORAL_CAPSULE | Freq: Two times a day (BID) | ORAL | Status: DC
  Administered 2014-05-23 – 2014-05-30 (×14): 75 mg via ORAL
  Filled 2014-05-23 (×16): qty 1

## 2014-05-23 MED ORDER — BISACODYL 5 MG PO TBEC
5.0000 mg | DELAYED_RELEASE_TABLET | Freq: Every day | ORAL | Status: DC | PRN
  Administered 2014-05-26: 5 mg via ORAL
  Filled 2014-05-23: qty 1

## 2014-05-23 MED ORDER — HEPARIN SODIUM (PORCINE) 5000 UNIT/ML IJ SOLN: 5000.0000 [IU] | Freq: Three times a day (TID) | INTRAMUSCULAR | Status: DC

## 2014-05-23 MED ORDER — ACETAMINOPHEN 325 MG PO TABS
650.0000 mg | ORAL_TABLET | Freq: Four times a day (QID) | ORAL | Status: DC | PRN
  Administered 2014-05-25 – 2014-05-28 (×2): 650 mg via ORAL
  Filled 2014-05-23 (×2): qty 2

## 2014-05-23 MED ORDER — ONDANSETRON HCL 4 MG PO TABS: 4.0000 mg | ORAL_TABLET | Freq: Four times a day (QID) | ORAL | Status: DC | PRN

## 2014-05-23 MED ORDER — ACETAMINOPHEN 650 MG RE SUPP: 650.0000 mg | Freq: Four times a day (QID) | RECTAL | Status: DC | PRN

## 2014-05-23 MED ORDER — SODIUM CHLORIDE 0.9 % IV BOLUS (SEPSIS)
500.0000 mL | Freq: Once | INTRAVENOUS | Status: AC
  Administered 2014-05-23: 500 mL via INTRAVENOUS

## 2014-05-23 MED ORDER — DOCUSATE SODIUM 100 MG PO CAPS
100.0000 mg | ORAL_CAPSULE | Freq: Two times a day (BID) | ORAL | Status: DC
  Administered 2014-05-24 – 2014-05-30 (×13): 100 mg via ORAL
  Filled 2014-05-23 (×15): qty 1

## 2014-05-23 MED ORDER — FOLIC ACID 1 MG PO TABS
1.0000 mg | ORAL_TABLET | Freq: Every day | ORAL | Status: DC
  Administered 2014-05-24 – 2014-05-30 (×7): 1 mg via ORAL
  Filled 2014-05-23 (×8): qty 1

## 2014-05-23 MED ORDER — SODIUM POLYSTYRENE SULFONATE 15 GM/60ML PO SUSP
30.0000 g | Freq: Once | ORAL | Status: AC
  Administered 2014-05-23: 30 g via ORAL
  Filled 2014-05-23: qty 120

## 2014-05-23 MED ORDER — SODIUM CHLORIDE 0.9 % IV SOLN
INTRAVENOUS | Status: DC
  Administered 2014-05-23: 21:00:00 via INTRAVENOUS

## 2014-05-23 MED ORDER — METOPROLOL TARTRATE 50 MG PO TABS
50.0000 mg | ORAL_TABLET | Freq: Two times a day (BID) | ORAL | Status: DC
  Administered 2014-05-24 – 2014-05-30 (×12): 50 mg via ORAL
  Filled 2014-05-23 (×17): qty 1

## 2014-05-23 MED ORDER — ALUM & MAG HYDROXIDE-SIMETH 200-200-20 MG/5ML PO SUSP: 30.0000 mL | Freq: Four times a day (QID) | ORAL | Status: DC | PRN

## 2014-05-23 MED ORDER — GLIPIZIDE 2.5 MG HALF TABLET
2.5000 mg | ORAL_TABLET | Freq: Every day | ORAL | Status: DC
  Administered 2014-05-24: 2.5 mg via ORAL
  Filled 2014-05-23 (×3): qty 1

## 2014-05-23 MED ORDER — VITAMIN B-1 100 MG PO TABS
100.0000 mg | ORAL_TABLET | Freq: Every day | ORAL | Status: DC
  Administered 2014-05-24 – 2014-05-30 (×7): 100 mg via ORAL
  Filled 2014-05-23 (×8): qty 1

## 2014-05-23 MED ORDER — SODIUM CHLORIDE 0.9 % IV SOLN: 500.0000 mg | INTRAVENOUS | Status: DC

## 2014-05-23 MED ORDER — ALPRAZOLAM 0.25 MG PO TABS
0.2500 mg | ORAL_TABLET | Freq: Two times a day (BID) | ORAL | Status: DC
  Administered 2014-05-23 – 2014-05-30 (×14): 0.25 mg via ORAL
  Filled 2014-05-23 (×13): qty 1

## 2014-05-23 MED ORDER — ONDANSETRON HCL 4 MG/2ML IJ SOLN: 4.0000 mg | Freq: Four times a day (QID) | INTRAMUSCULAR | Status: DC | PRN

## 2014-05-23 MED ORDER — SPIRONOLACTONE 25 MG PO TABS: 25.0000 mg | ORAL_TABLET | Freq: Every day | ORAL | Status: DC

## 2014-05-23 MED ORDER — VANCOMYCIN HCL IN DEXTROSE 1-5 GM/200ML-% IV SOLN
1000.0000 mg | Freq: Once | INTRAVENOUS | Status: AC
  Administered 2014-05-23: 1000 mg via INTRAVENOUS
  Filled 2014-05-23: qty 200

## 2014-05-23 MED ORDER — ADULT MULTIVITAMIN W/MINERALS CH
1.0000 | ORAL_TABLET | Freq: Every day | ORAL | Status: DC
  Administered 2014-05-24 – 2014-05-30 (×7): 1 via ORAL
  Filled 2014-05-23 (×8): qty 1

## 2014-05-23 MED ORDER — PIPERACILLIN-TAZOBACTAM IN DEX 2-0.25 GM/50ML IV SOLN
2.2500 g | Freq: Three times a day (TID) | INTRAVENOUS | Status: DC
  Administered 2014-05-24 (×3): 2.25 g via INTRAVENOUS
  Filled 2014-05-23 (×3): qty 50

## 2014-05-23 MED ORDER — ASPIRIN EC 81 MG PO TBEC: 81.0000 mg | DELAYED_RELEASE_TABLET | Freq: Every day | ORAL | Status: DC

## 2014-05-23 NOTE — Progress Notes (Signed)
Utilization Review completed.  Marsh Heckler RN CM  

## 2014-05-23 NOTE — ED Notes (Addendum)
Attempted to start IV without success x 2.  Asked Alaina CN to see if she can start one.  Belfi EDP made aware

## 2014-05-23 NOTE — ED Provider Notes (Addendum)
CSN: [id number]     Arrival date & time 05/23/14  1328 History   First MD Initiated Contact with Patient 05/23/14 1507     Chief Complaint  Patient presents with  . Weakness  . Anxiety     (Consider location/radiation/quality/duration/timing/severity/associated sxs/prior Treatment) HPI Comments: Pt presents with generalized weakness.  She was hospitalized in May with a UTI.  Was subsequently sent to rehab.  Was discharged from rehab about 3 weeks ago.  Starting having UTI symptoms of frequency, lower back pain last week.  Dropped off a urine specimen at [facility], was started on Macrobid which she has been on for last 3 days.  Started having vomiting yesterday.  Is having subjective fever, chills.  Unable to ambulate without assistance today.  Was previously walking with a walker.  Has a hx of PAF, tachybrady syndrome, DM, HTN.  Denies CP or SOB.  Denies leg swelling.  Had one episode of diarrhea on Friday, none since then.  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    Patient is a 80 y.o. female presenting with weakness and anxiety.  Weakness Associated symptoms include abdominal pain (suprapubic) and headaches. Pertinent negatives include no chest pain and no shortness of breath.  Anxiety Associated symptoms include abdominal pain (suprapubic) and headaches. Pertinent negatives include no chest pain and no shortness of breath.    Past Medical History  Diagnosis Date  . Syncope   . Sinus node dysfunction     a. s/p pacemaker.  . Wide-complex tachycardia   . Cholelithiasis   . Glucose intolerance (impaired glucose tolerance)   . Anxiety   . PONV (postoperative nausea and vomiting)   . Chronic kidney disease     a. Baseline Cr reported 1.8-2.0.  . Injury of right upper arm     after venipuncture in 3/13 - pt describes pain afterwards- no followup done seen by PCP- no further treatment   . PAF (paroxysmal atrial fibrillation)   . Left bundle branch block   .  Tachycardia-bradycardia syndrome   . CHF (congestive heart failure)     a. EF 30-35% by echo 2015.  [name] Pacemaker     Guidant, implanted 2006  . Type II diabetes mellitus     "recently dx'd" (03/09/2014)  . Breast cancer 2003    "radiation on right; mastectomy on the left"  . Hypertension   . GERD (gastroesophageal reflux disease)   . DJD (degenerative joint disease)   . Arthritis     "real bad in my lower back" (03/09/2014)  . Chronic lower back pain   . Orthostatic hypotension     a. Diuretics d/c'd 03/2014.  [name] PAT (paroxysmal atrial tachycardia)    Past Surgical History  Procedure Laterality Date  . Replacement total knee bilateral Bilateral   . Total shoulder replacement    . Tonsillectomy    . Orif distal femur fracture Right 12/2007  . Tonsillectomy    . Mastectomy Left 2003  . Total hip arthroplasty  06/16/2012    Procedure: TOTAL HIP ARTHROPLASTY;  Surgeon: Mauri Pole, MD;  Location: [facility];  Service: Orthopedics;  Laterality: Left;  . Hip closed reduction  06/30/2012    Procedure: CLOSED MANIPULATION HIP;  Surgeon: Mauri Pole, MD;  Location: [facility];  Service: Orthopedics;  Laterality: Left;  . Abdominal hysterectomy    . Pacemaker insertion      Guidant Insignia I  . Cataract extraction w/ intraocular lens  implant, bilateral Bilateral  2000's  . Refractive surgery Bilateral 01/2014  . Inguinal hernia repair Left     "had emergency OR for it a few years ago" (03/09/2014)  . Joint replacement    . Breast biopsy Bilateral    Family History  Problem Relation Age of Onset  . Hypertension     History  Substance Use Topics  . Smoking status: Former Smoker -- 0.50 packs/day for 6 years    Types: Cigarettes  . Smokeless tobacco: Never Used  . Alcohol Use: Yes     Comment: "I'll have a social drink maybe at Chrismastime"   OB History   Grav Para Term Preterm Abortions TAB SAB Ect Mult Living                 Review of Systems  Constitutional: Positive for fever,  chills and fatigue. Negative for diaphoresis.  HENT: Negative for congestion, rhinorrhea and sneezing.   Eyes: Negative.   Respiratory: Negative for cough, chest tightness and shortness of breath.   Cardiovascular: Negative for chest pain and leg swelling.  Gastrointestinal: Positive for abdominal pain (suprapubic). Negative for nausea, vomiting, diarrhea and blood in stool.  Genitourinary: Negative for frequency, hematuria, flank pain and difficulty urinating.  Musculoskeletal: Positive for back pain. Negative for arthralgias.  Skin: Negative for rash.  Neurological: Positive for weakness and headaches. Negative for dizziness, speech difficulty and numbness.      Allergies  Codeine; Hydrocodone-acetaminophen; and Other  Home Medications   Prior to Admission medications   Medication Sig Start Date End Date Taking? Authorizing Provider  acetaminophen (TYLENOL) 325 MG tablet Take 325 mg by mouth every 6 (six) hours as needed for pain.   Yes Historical Provider, MD  ALPRAZolam Duanne Moron) 0.25 MG tablet Take one tablet by mouth twice daily for anxiety 03/29/14  Yes Estill Dooms, MD  B Complex-C (B-COMPLEX WITH VITAMIN C) tablet Take 1 tablet by mouth daily.   Yes Historical Provider, MD  dabigatran (PRADAXA) 75 MG CAPS Take 75 mg by mouth 2 (two) times daily.  05/29/11  Yes Deboraha Sprang, MD  diclofenac sodium (VOLTAREN) 1 % GEL Apply 2 g topically 4 (four) times daily. 11/25/12  Yes Geradine Girt, DO  glipiZIDE (GLUCOTROL) 5 MG tablet Take 0.5 tablets (2.5 mg total) by mouth daily before breakfast. 03/22/14  Yes Kinnie Feil, MD  metoprolol (LOPRESSOR) 50 MG tablet Take 1 tablet (50 mg total) by mouth 2 (two) times daily. 12/01/13  Yes Deboraha Sprang, MD  nitrofurantoin, macrocrystal-monohydrate, (MACROBID) 100 MG capsule Take 100 mg by mouth 2 (two) times daily. 10 days 05/16/14  Yes Historical Provider, MD  pantoprazole (PROTONIX) 40 MG tablet Take 40 mg by mouth daily.   Yes Historical  Provider, MD  spironolactone (ALDACTONE) 25 MG tablet Take 25 mg by mouth daily. 05/19/14  Yes Historical Provider, MD   BP 109/68  Pulse 65  Temp(Src) 97.4 F (36.3 C) (Oral)  Resp 15  SpO2 96% Physical Exam  Constitutional: She is oriented to person, place, and time. She appears well-developed and well-nourished.  HENT:  Head: Normocephalic and atraumatic.  slightly dry mucus membranes  Eyes: Pupils are equal, round, and reactive to light.  Neck: Normal range of motion. Neck supple.  Cardiovascular: Normal rate, regular rhythm and normal heart sounds.   Pulmonary/Chest: Effort normal and breath sounds normal. No respiratory distress. She has no wheezes. She has no rales. She exhibits no tenderness.  Abdominal: Soft. Bowel sounds are normal. There  is no tenderness (mild suprapubic tenderness). There is no rebound and no guarding.  Musculoskeletal: Normal range of motion. She exhibits no edema.  Lymphadenopathy:    She has no cervical adenopathy.  Neurological: She is alert and oriented to person, place, and time. She has normal strength. No cranial nerve deficit or sensory deficit. GCS eye subscore is 4. GCS verbal subscore is 5. GCS motor subscore is 6.  Skin: Skin is warm and dry. No rash noted.  Psychiatric: She has a normal mood and affect.    ED Course  Procedures (including critical care time) Labs Review Results for orders placed during the hospital encounter of 05/23/14  CBC WITH DIFFERENTIAL      Result Value Ref Range   WBC 6.0  4.0 - 10.5 K/uL   RBC 3.65 (*) 3.87 - 5.11 MIL/uL   Hemoglobin 11.8 (*) 12.0 - 15.0 g/dL   HCT 35.9 (*) 36.0 - 46.0 %   MCV 98.4  78.0 - 100.0 fL   MCH 32.3  26.0 - 34.0 pg   MCHC 32.9  30.0 - 36.0 g/dL   RDW 14.5  11.5 - 15.5 %   Platelets 190  150 - 400 K/uL   Neutrophils Relative % 91 (*) 43 - 77 %   Neutro Abs 5.4  1.7 - 7.7 K/uL   Lymphocytes Relative 3 (*) 12 - 46 %   Lymphs Abs 0.2 (*) 0.7 - 4.0 K/uL   Monocytes Relative 3  3 -  12 %   Monocytes Absolute 0.2  0.1 - 1.0 K/uL   Eosinophils Relative 3  0 - 5 %   Eosinophils Absolute 0.2  0.0 - 0.7 K/uL   Basophils Relative 0  0 - 1 %   Basophils Absolute 0.0  0.0 - 0.1 K/uL  COMPREHENSIVE METABOLIC PANEL      Result Value Ref Range   Sodium 130 (*) 137 - 147 mEq/L   Potassium 5.9 (*) 3.7 - 5.3 mEq/L   Chloride 97  96 - 112 mEq/L   CO2 19  19 - 32 mEq/L   Glucose, Bld 118 (*) 70 - 99 mg/dL   BUN 65 (*) 6 - 23 mg/dL   Creatinine, Ser 1.89 (*) 0.50 - 1.10 mg/dL   Calcium 9.4  8.4 - 10.5 mg/dL   Total Protein 6.4  6.0 - 8.3 g/dL   Albumin 3.0 (*) 3.5 - 5.2 g/dL   AST 30  0 - 37 U/L   ALT 84 (*) 0 - 35 U/L   Alkaline Phosphatase 92  39 - 117 U/L   Total Bilirubin 0.4  0.3 - 1.2 mg/dL   GFR calc non Af Amer 23 (*) >90 mL/min   GFR calc Af Amer 26 (*) >90 mL/min   Anion gap 14  5 - 15  URINALYSIS, ROUTINE W REFLEX MICROSCOPIC      Result Value Ref Range   Color, Urine AMBER (*) YELLOW   APPearance CLOUDY (*) CLEAR   Specific Gravity, Urine 1.016  1.005 - 1.030   pH 6.0  5.0 - 8.0   Glucose, UA NEGATIVE  NEGATIVE mg/dL   Hgb urine dipstick SMALL (*) NEGATIVE   Bilirubin Urine NEGATIVE  NEGATIVE   Ketones, ur NEGATIVE  NEGATIVE mg/dL   Protein, ur NEGATIVE  NEGATIVE mg/dL   Urobilinogen, UA 1.0  0.0 - 1.0 mg/dL   Nitrite NEGATIVE  NEGATIVE   Leukocytes, UA MODERATE (*) NEGATIVE  LACTIC ACID, PLASMA  Result Value Ref Range   Lactic Acid, Venous 1.6  0.5 - 2.2 mmol/L  URINE MICROSCOPIC-ADD ON      Result Value Ref Range   Squamous Epithelial / LPF FEW (*) RARE   WBC, UA 3-6  <3 WBC/hpf   RBC / HPF 7-10  <3 RBC/hpf   Bacteria, UA MANY (*) RARE   Urine-Other AMORPHOUS URATES/PHOSPHATES     Dg Chest 2 View  05/23/2014   CLINICAL DATA:  Fever.  EXAM: CHEST  2 VIEW  COMPARISON:  03/15/2014.  FINDINGS: Mediastinum and hilar structures normal. Cardiac pacer in stable position. Cardiomegaly with normal pulmonary vascularity. Prominent left upper lobe  infiltrate consistent with pneumonia. No pleural effusion or pneumothorax. Right shoulder replacement. Postsurgical changes left shoulder with deformity of the left humeral head.  IMPRESSION: Prominent left upper lobe infiltrate consistent with pneumonia.   Electronically Signed   By: Mott   On: 05/23/2014 17:16     Imaging Review Dg Chest 2 View  05/23/2014   CLINICAL DATA:  Fever.  EXAM: CHEST  2 VIEW  COMPARISON:  03/15/2014.  FINDINGS: Mediastinum and hilar structures normal. Cardiac pacer in stable position. Cardiomegaly with normal pulmonary vascularity. Prominent left upper lobe infiltrate consistent with pneumonia. No pleural effusion or pneumothorax. Right shoulder replacement. Postsurgical changes left shoulder with deformity of the left humeral head.  IMPRESSION: Prominent left upper lobe infiltrate consistent with pneumonia.   Electronically Signed   By: Marcello Moores  Register   On: 05/23/2014 17:16     EKG Interpretation   Date/Time:  Monday May 23 2014 13:56:05 EDT Ventricular Rate:  65 PR Interval:  115 QRS Duration: 192 QT Interval:  623 QTC Calculation: 648 R Axis:   -96 Text Interpretation:  A-V dual-paced rhythm with some inhibition No  further analysis attempted due to paced rhythm Confirmed by Naksh Radi  MD,  Guillaume Weninger ([id number]) on 05/23/2014 6:35:00 PM      MDM   Final diagnoses:  UTI (lower urinary tract infection)  Healthcare-associated pneumonia    1600: contacted [facility], urine culture did grow out mulit drug resistant e. coli  Patient evidence of persistent UTI despite being on Macrobid. She also has evidence of pneumonia on chest x-ray. She was treated with Zosyn and vancomycin which should cover both the presumed healthcare associated pneumonia and persistent UTI. I will consult hospitalist for admission.  Malvin Johns, MD 05/23/14 [id number]  Malvin Johns, MD 05/23/14 [phone]

## 2014-05-23 NOTE — Progress Notes (Signed)
ANTIBIOTIC CONSULT NOTE - INITIAL  Pharmacy Consult for Vancomycin and Zosyn Indication: Pneumonia  Allergies  Allergen Reactions  . Codeine Nausea And Vomiting  . Hydrocodone-Acetaminophen Itching  . Other Nausea And Vomiting    Narcotics  dizziness    Patient Measurements: Height: 5' 4.5" (163.8 cm) Weight: 107 lb 9.4 oz (48.8 kg) IBW/kg (Calculated) : 55.85  Vital Signs: Temp: 97.5 F (36.4 C) (07/20 2016) Temp src: Oral (07/20 2016) BP: 88/47 mmHg (07/20 2016) Pulse Rate: 67 (07/20 2016) Intake/Output from previous day:   Intake/Output from this shift:    Labs:  Recent Labs  05/23/14 1605  WBC 6.0  HGB 11.8*  PLT 190  CREATININE 1.89*   Estimated Creatinine Clearance: 16.2 ml/min (by C-G formula based on Cr of 1.89). No results found for this basename: VANCOTROUGH, VANCOPEAK, VANCORANDOM, GENTTROUGH, GENTPEAK, GENTRANDOM, TOBRATROUGH, TOBRAPEAK, TOBRARND, AMIKACINPEAK, AMIKACINTROU, AMIKACIN,  in the last 72 hours   Microbiology: No results found for this or any previous visit (from the past 720 hour(s)).  Medical History: Past Medical History  Diagnosis Date  . Syncope   . Sinus node dysfunction     a. s/p pacemaker.  . Wide-complex tachycardia   . Cholelithiasis   . Glucose intolerance (impaired glucose tolerance)   . Anxiety   . PONV (postoperative nausea and vomiting)   . Chronic kidney disease     a. Baseline Cr reported 1.8-2.0.  . Injury of right upper arm     after venipuncture in 3/13 - pt describes pain afterwards- no followup done seen by PCP- no further treatment   . PAF (paroxysmal atrial fibrillation)   . Left bundle branch block   . Tachycardia-bradycardia syndrome   . CHF (congestive heart failure)     a. EF 30-35% by echo 2015.  [name] Pacemaker     Guidant, implanted 2006  . Type II diabetes mellitus     "recently dx'd" (03/09/2014)  . Breast cancer 2003    "radiation on right; mastectomy on the left"  . Hypertension   . GERD  (gastroesophageal reflux disease)   . DJD (degenerative joint disease)   . Arthritis     "real bad in my lower back" (03/09/2014)  . Chronic lower back pain   . Orthostatic hypotension     a. Diuretics d/c'd 03/2014.  [name] PAT (paroxysmal atrial tachycardia)     Medications:  Scheduled:  . ALPRAZolam  0.25 mg Oral BID  . aspirin EC  81 mg Oral Daily  . dabigatran  75 mg Oral BID  . diclofenac sodium  2 g Topical QID  . docusate sodium  100 mg Oral BID  . folic acid  1 mg Oral Daily  . [START ON 05/24/2014] glipiZIDE  2.5 mg Oral QAC breakfast  . heparin  5,000 Units Subcutaneous 3 times per day  . metoprolol  50 mg Oral BID  . multivitamin with minerals  1 tablet Oral Daily  . pantoprazole  40 mg Oral Daily  . sodium polystyrene  30 g Oral Once  . spironolactone  25 mg Oral Daily  . thiamine  100 mg Oral Daily  . vancomycin  1,000 mg Intravenous Once   Infusions:  . sodium chloride     PRN: acetaminophen, acetaminophen, alum & mag hydroxide-simeth, bisacodyl, ondansetron (ZOFRAN) IV, ondansetron  Assessment: 80 yo female presents with weakness. Placed on nitrofurantoin 3 days ago for UTI, but continued to have fevers. In ED, CXR shows RUL infiltrate consistent with pneumonia so consulted  to dose vanc and zosyn.   Goal of Therapy:  Vancomycin trough level 15-20 mcg/ml Zosyn dose per renal function  Plan:   Vancomycin 1g IV given in ED, continue with 500mg  IV q48h - next dose due 7/22 Check trough at steady state  Zosyn 3.375g IV given in ED, continue with 2.25g IV q8h Follow up renal function & cultures, clinical course  Peggyann Juba, PharmD, BCPS Pager: [phone] 05/23/2014,8:31 PM

## 2014-05-23 NOTE — H&P (Signed)
[facility] History and Physical  CHAKA BOYSON OEV:[id number] DOB: [birth year] DOA: 05/23/2014  Referring physician: Malvin Johns, MD PCP: [name], MD   Chief Complaint: Pneumonia  HPI: Sophia Oliver is a 80 y.o. female presents with general weakness. Patient states that she has been having nausea and vomiting. She hasnot been able to care for herself. She was recently discharged from rehab after admission in may to acute care. She has had the dwindles was seen by urology and found to have another UTI. Patient was started on macrobid and has been taking it for the last 3 days. Patient does not appear to be getting any better. She states that she has felt hot and feverish. She has had no CP noted. She states on occasion she gets short of breath. She has had some diarrhea also. She was seen in the ED and an xray also reveals that she has a RUL infiltrate consistent with pneumonia so will require admission for treatment.    Review of Systems:  Constitutional:  No night sweats, ++Fevers, ++chills, ++fatigue.  HEENT:  No headaches  Cardio-vascular:  No chest pain, PND, swelling in lower extremities  GI:  No heartburn, indigestion, abdominal pain, ++nausea, ++vomiting, ++diarrhea, ++loss of appetite  Resp:  No shortness of breath with exertion or at rest. No excess mucus, no productive cough, No non-productive cough Skin:  no rash or lesions.  GU:  ++dysuria, change in color of urine++frequency Musculoskeletal:  No joint pain or swelling.  Psych:  No depression or anxiety.   Past Medical History  Diagnosis Date  . Syncope   . Sinus node dysfunction     a. s/p pacemaker.  . Wide-complex tachycardia   . Cholelithiasis   . Glucose intolerance (impaired glucose tolerance)   . Anxiety   . PONV (postoperative nausea and vomiting)   . Chronic kidney disease     a. Baseline Cr reported 1.8-2.0.  . Injury of right upper arm     after venipuncture in 3/13 - pt describes pain  afterwards- no followup done seen by PCP- no further treatment   . PAF (paroxysmal atrial fibrillation)   . Left bundle branch block   . Tachycardia-bradycardia syndrome   . CHF (congestive heart failure)     a. EF 30-35% by echo 2015.  [name] Pacemaker     Guidant, implanted 2006  . Type II diabetes mellitus     "recently dx'd" (03/09/2014)  . Breast cancer 2003    "radiation on right; mastectomy on the left"  . Hypertension   . GERD (gastroesophageal reflux disease)   . DJD (degenerative joint disease)   . Arthritis     "real bad in my lower back" (03/09/2014)  . Chronic lower back pain   . Orthostatic hypotension     a. Diuretics d/c'd 03/2014.  [name] PAT (paroxysmal atrial tachycardia)    Past Surgical History  Procedure Laterality Date  . Replacement total knee bilateral Bilateral   . Total shoulder replacement    . Tonsillectomy    . Orif distal femur fracture Right 12/2007  . Tonsillectomy    . Mastectomy Left 2003  . Total hip arthroplasty  06/16/2012    Procedure: TOTAL HIP ARTHROPLASTY;  Surgeon: Mauri Pole, MD;  Location: [facility];  Service: Orthopedics;  Laterality: Left;  . Hip closed reduction  06/30/2012    Procedure: CLOSED MANIPULATION HIP;  Surgeon: Mauri Pole, MD;  Location: [facility];  Service: Orthopedics;  Laterality: Left;  .  Abdominal hysterectomy    . Pacemaker insertion      Guidant Insignia I  . Cataract extraction w/ intraocular lens  implant, bilateral Bilateral 2000's  . Refractive surgery Bilateral 01/2014  . Inguinal hernia repair Left     "had emergency OR for it a few years ago" (03/09/2014)  . Joint replacement    . Breast biopsy Bilateral    Social History:  reports that she has quit smoking. Her smoking use included Cigarettes. She has a 3 pack-year smoking history. She has never used smokeless tobacco. She reports that she drinks alcohol. She reports that she does not use illicit drugs.  Allergies  Allergen Reactions  . Codeine Nausea And Vomiting    . Hydrocodone-Acetaminophen Itching  . Other Nausea And Vomiting    Narcotics  dizziness    Family History  Problem Relation Age of Onset  . Hypertension       Prior to Admission medications   Medication Sig Start Date End Date Taking? Authorizing Provider  acetaminophen (TYLENOL) 325 MG tablet Take 325 mg by mouth every 6 (six) hours as needed for pain.   Yes Historical Provider, MD  ALPRAZolam Duanne Moron) 0.25 MG tablet Take one tablet by mouth twice daily for anxiety 03/29/14  Yes Estill Dooms, MD  B Complex-C (B-COMPLEX WITH VITAMIN C) tablet Take 1 tablet by mouth daily.   Yes Historical Provider, MD  dabigatran (PRADAXA) 75 MG CAPS Take 75 mg by mouth 2 (two) times daily.  05/29/11  Yes Deboraha Sprang, MD  diclofenac sodium (VOLTAREN) 1 % GEL Apply 2 g topically 4 (four) times daily. 11/25/12  Yes Geradine Girt, DO  glipiZIDE (GLUCOTROL) 5 MG tablet Take 0.5 tablets (2.5 mg total) by mouth daily before breakfast. 03/22/14  Yes Kinnie Feil, MD  metoprolol (LOPRESSOR) 50 MG tablet Take 1 tablet (50 mg total) by mouth 2 (two) times daily. 12/01/13  Yes Deboraha Sprang, MD  nitrofurantoin, macrocrystal-monohydrate, (MACROBID) 100 MG capsule Take 100 mg by mouth 2 (two) times daily. 10 days 05/16/14  Yes Historical Provider, MD  pantoprazole (PROTONIX) 40 MG tablet Take 40 mg by mouth daily.   Yes Historical Provider, MD  spironolactone (ALDACTONE) 25 MG tablet Take 25 mg by mouth daily. 05/19/14  Yes Historical Provider, MD   Physical Exam: Filed Vitals:   05/23/14 1355 05/23/14 1616 05/23/14 1904  BP: 113/61 109/68 130/56  Pulse: 68 65 65  Temp: 97.4 F (36.3 C)    TempSrc: Oral    Resp: 20 15 14   SpO2: 98% 96% 97%    Wt Readings from Last 3 Encounters:  04/14/14 48.081 kg (106 lb)  03/22/14 48.807 kg (107 lb 9.6 oz)  03/12/14 51.302 kg (113 lb 1.6 oz)    General:  Appears calm and comfortable Eyes: PERRL, normal lids, irises & conjunctiva ENT: grossly normal hearing,  lips & tongue Neck: no LAD, masses or thyromegaly Cardiovascular: RRR, no m/r/g. No LE edema. Respiratory: few scattered ronchi. Normal respiratory effort. Abdomen: soft, ntnd Skin: no rash or induration seen on limited exam Musculoskeletal: grossly normal tone BUE/BLE Psychiatric: grossly flat affect, speech appropriate Neurologic: grossly non-focal.          Labs on Admission:  Basic Metabolic Panel:  Recent Labs Lab 05/23/14 1605  NA 130*  K 5.9*  CL 97  CO2 19  GLUCOSE 118*  BUN 65*  CREATININE 1.89*  CALCIUM 9.4   Liver Function Tests:  Recent Labs Lab 05/23/14 1605  AST 30  ALT 84*  ALKPHOS 92  BILITOT 0.4  PROT 6.4  ALBUMIN 3.0*   No results found for this basename: LIPASE, AMYLASE,  in the last 168 hours No results found for this basename: AMMONIA,  in the last 168 hours CBC:  Recent Labs Lab 05/23/14 1605  WBC 6.0  NEUTROABS 5.4  HGB 11.8*  HCT 35.9*  MCV 98.4  PLT 190   Cardiac Enzymes: No results found for this basename: CKTOTAL, CKMB, CKMBINDEX, TROPONINI,  in the last 168 hours  BNP (last 3 results)  Recent Labs  12/06/13 1129  PROBNP 200.0*   CBG: No results found for this basename: GLUCAP,  in the last 168 hours  Radiological Exams on Admission: Dg Chest 2 View  05/23/2014   CLINICAL DATA:  Fever.  EXAM: CHEST  2 VIEW  COMPARISON:  03/15/2014.  FINDINGS: Mediastinum and hilar structures normal. Cardiac pacer in stable position. Cardiomegaly with normal pulmonary vascularity. Prominent left upper lobe infiltrate consistent with pneumonia. No pleural effusion or pneumothorax. Right shoulder replacement. Postsurgical changes left shoulder with deformity of the left humeral head.  IMPRESSION: Prominent left upper lobe infiltrate consistent with pneumonia.   Electronically Signed   By: Marcello Moores  Register   On: 05/23/2014 17:16     Assessment/Plan Active Problems:   Pneumonia   1. RUL Pneumonia -will start on zosyn and vancomycin  and adjust dose for renal insufficiency -Repeat CXR in am -oxygen therapy as needed  2. UTI -will be covered with above antibiotics -cultures have been ordered  3. Weakness -will hydrate as tolerated -likely related to underlying infection  4. Hyperkalemia/Hyponatremia -will give IVF fluids -monitor her Potassium with repeat labs -Kayexalate ordered  5. Acute Renal Failure -likely related to acute infection -will hydrate -monitor IOs -repeat labs in am     Code Status: Full Code (must indicate code status--if unknown or must be presumed, indicate so) DVT Prophylaxis:heparin Family Communication: None (indicate person spoken with, if applicable, with phone number if by telephone) Disposition Plan: Home (indicate anticipated LOS)  Time spent: 70min  KHAN,SAADAT A [facility] Pager [phone]  **Disclaimer: This note may have been dictated with voice recognition software. Similar sounding words can inadvertently be transcribed and this note may contain transcription errors which may not have been corrected upon publication of note.**

## 2014-05-23 NOTE — ED Notes (Signed)
Bed: WA08 Expected date: 05/23/14 Expected time:  Means of arrival:  Comments: ems
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
## 2014-05-24 ENCOUNTER — Inpatient Hospital Stay (HOSPITAL_COMMUNITY): Payer: Medicare Other

## 2014-05-24 DIAGNOSIS — E440 Moderate protein-calorie malnutrition: Secondary | ICD-10-CM | POA: Diagnosis present

## 2014-05-24 DIAGNOSIS — E119 Type 2 diabetes mellitus without complications: Secondary | ICD-10-CM

## 2014-05-24 DIAGNOSIS — N183 Chronic kidney disease, stage 3 unspecified: Secondary | ICD-10-CM

## 2014-05-24 DIAGNOSIS — E8770 Fluid overload, unspecified: Secondary | ICD-10-CM | POA: Diagnosis not present

## 2014-05-24 DIAGNOSIS — I4891 Unspecified atrial fibrillation: Secondary | ICD-10-CM

## 2014-05-24 LAB — GLUCOSE, CAPILLARY
GLUCOSE-CAPILLARY: 145 mg/dL — AB (ref 70–99)
GLUCOSE-CAPILLARY: 62 mg/dL — AB (ref 70–99)
GLUCOSE-CAPILLARY: 85 mg/dL (ref 70–99)
Glucose-Capillary: 118 mg/dL — ABNORMAL HIGH (ref 70–99)
Glucose-Capillary: 209 mg/dL — ABNORMAL HIGH (ref 70–99)
Glucose-Capillary: 53 mg/dL — ABNORMAL LOW (ref 70–99)
Glucose-Capillary: 63 mg/dL — ABNORMAL LOW (ref 70–99)
Glucose-Capillary: 81 mg/dL (ref 70–99)
Glucose-Capillary: 99 mg/dL (ref 70–99)

## 2014-05-24 LAB — COMPREHENSIVE METABOLIC PANEL
ALBUMIN: 2.8 g/dL — AB (ref 3.5–5.2)
ALK PHOS: 93 U/L (ref 39–117)
ALT: 74 U/L — ABNORMAL HIGH (ref 0–35)
AST: 41 U/L — AB (ref 0–37)
Anion gap: 14 (ref 5–15)
BILIRUBIN TOTAL: 0.4 mg/dL (ref 0.3–1.2)
BUN: 55 mg/dL — ABNORMAL HIGH (ref 6–23)
CHLORIDE: 103 meq/L (ref 96–112)
CO2: 19 mEq/L (ref 19–32)
Calcium: 8.8 mg/dL (ref 8.4–10.5)
Creatinine, Ser: 1.73 mg/dL — ABNORMAL HIGH (ref 0.50–1.10)
GFR calc Af Amer: 29 mL/min — ABNORMAL LOW (ref >90)
GFR calc non Af Amer: 25 mL/min — ABNORMAL LOW (ref >90)
Glucose, Bld: 28 mg/dL — CL (ref 70–99)
Potassium: 5.1 mEq/L (ref 3.7–5.3)
Sodium: 136 mEq/L — ABNORMAL LOW (ref 137–147)
Total Protein: 5.9 g/dL — ABNORMAL LOW (ref 6.0–8.3)

## 2014-05-24 LAB — CBC
HEMATOCRIT: 33.8 % — AB (ref 36.0–46.0)
Hemoglobin: 11.5 g/dL — ABNORMAL LOW (ref 12.0–15.0)
MCH: 33.2 pg (ref 26.0–34.0)
MCHC: 34 g/dL (ref 30.0–36.0)
MCV: 97.7 fL (ref 78.0–100.0)
PLATELETS: 191 10*3/uL (ref 150–400)
RBC: 3.46 MIL/uL — ABNORMAL LOW (ref 3.87–5.11)
RDW: 14.8 % (ref 11.5–15.5)
WBC: 6.1 10*3/uL (ref 4.0–10.5)

## 2014-05-24 LAB — TSH: TSH: 2.65 u[IU]/mL (ref 0.350–4.500)

## 2014-05-24 LAB — HEMOGLOBIN A1C
Hgb A1c MFr Bld: 6.2 % — ABNORMAL HIGH (ref <5.7)
Mean Plasma Glucose: 131 mg/dL — ABNORMAL HIGH (ref <117)

## 2014-05-24 MED ORDER — FUROSEMIDE 10 MG/ML IJ SOLN: 20.0000 mg | Freq: Once | INTRAMUSCULAR | Status: DC

## 2014-05-24 MED ORDER — DEXTROSE 50 % IV SOLN
1.0000 | Freq: Once | INTRAVENOUS | Status: AC
  Administered 2014-05-24: 50 mL via INTRAVENOUS
  Filled 2014-05-24: qty 50

## 2014-05-24 MED ORDER — LEVOFLOXACIN IN D5W 250 MG/50ML IV SOLN
250.0000 mg | INTRAVENOUS | Status: DC
  Administered 2014-05-26: 250 mg via INTRAVENOUS
  Filled 2014-05-24: qty 50

## 2014-05-24 MED ORDER — GLUCERNA SHAKE PO LIQD
237.0000 mL | Freq: Two times a day (BID) | ORAL | Status: DC
Start: 2014-05-24 — End: 2014-05-30
  Administered 2014-05-24 – 2014-05-30 (×11): 237 mL via ORAL
  Filled 2014-05-24 (×13): qty 237

## 2014-05-24 MED ORDER — LEVOFLOXACIN IN D5W 500 MG/100ML IV SOLN
500.0000 mg | Freq: Once | INTRAVENOUS | Status: AC
  Administered 2014-05-24: 500 mg via INTRAVENOUS
  Filled 2014-05-24: qty 100

## 2014-05-24 NOTE — Care Management Note (Signed)
    Page 1 of 1   05/25/2014     1:27:23 PM CARE MANAGEMENT NOTE 05/25/2014  Patient:  Sophia Oliver, Sophia Oliver   Account Number:  [id number]  Date Initiated:  05/24/2014  Documentation initiated by:  [facility]  Subjective/Objective Assessment:   adm: UTI     Action/Plan:   discharge planning   Anticipated DC Date:  05/27/2014   Anticipated DC Plan:  [place]  CM consult      Choice offered to / List presented to:             [place]   Status of service:  In process, will continue to follow Medicare Important Message given?  YES (If response is "NO", the following Medicare IM given date fields will be blank) Date Medicare IM given:  05/23/2014 Medicare IM given by:   Date Additional Medicare IM given:   Additional Medicare IM given by:    Discharge Disposition:    Per UR Regulation:    If discussed at Long Length of Stay Meetings, dates discussed:    Comments:  05/25/14 13:10 CM met with pt in room as OT recc. SNF.  Pt states she does not want to return to [facility] but will consider other SNFs for short term Rehab.  CM notified CSW to research SNF options.  No other CM needs were communicated.  Mariane Masters, BSN, [place].  05/24/14 13:30 CM met with pt in room to discuss post hospitlization needs.  Pt states she lives with her husband and wishes to return home with home health rather than going to a facility.  Pt states she has had HH before and believes it was with [place].  she wishes to have [place] again if [facility] services are ordered.  CM has requested PT/OT EVAL for recc. from MD.  CM verified address and contact information with pt.  Referral called to [facility] (as a hotlist entry as dc date has not been determined.  Will continue to follow for disposition needs. Mariane Masters, BSN, CM [phone].

## 2014-05-24 NOTE — Progress Notes (Signed)
[facility] PROGRESS NOTE  Sophia Oliver DGU:[id number] DOB: 1924-05-03 DOA: 05/23/2014 PCP: [name], MD  Assessment/Plan: #1 ??? healthcare associated pneumonia vs volume overload Per chest x-ray, on admission. Repeat chest x-ray negative for infiltrate however concern for volume overload. Patient is currently afebrile. Patient with a normal white count. Will saline lock IV fluids. Patient on presentation was hypotensive. Patient with sats of 100% on room air. Will D/C IV vancomycin and IV Zosyn. Will place empirically on IV Levaquin as patient also has a concurrent UTI. Follow.  #2 hyperkalemia Resolved.  #3 chronic kidney disease stage III Stable. Follow.  #4 type 2 diabetes Hemoglobin A1c 6.2. Patient noted to have episodes of hypoglycemia. Will discontinue home regimen of glipizide. Liberalize dietary lead diet. Follow CBGs.  #5 UTI Check urine cultures. Narrow antibiotic to IV Levaquin.  #6 generalized weakness Likely secondary to UTI and debility. PT/OT.  #7 atrial fibrillation/tachybradycardia syndrome/status post permanent pacemaker Continue Lopressor for rate control. Pradaxa.  #8 prophylaxis Pradaxa for DVT prophylaxis   Code Status: Full Family Communication: Updated patient and family at bedside. Disposition Plan: Back to skilled nursing facility when medically stable.   Consultants:  None  Procedures:  Chest x-ray 05/24/2014, 05/23/2014  Antibiotics:  IV vancomycin 05/23/2014  IV Zosyn 05/23/2014  HPI/Subjective: Patient with no complaints.  Objective: Filed Vitals:   05/24/14 1330  BP: 113/82  Pulse: 65  Temp: 98.1 F (36.7 C)  Resp: 16    Intake/Output Summary (Last 24 hours) at 05/24/14 1838 Last data filed at 05/24/14 1724  Gross per 24 hour  Intake    480 ml  Output    450 ml  Net     30 ml   Filed Weights   05/23/14 2016  Weight: 48.8 kg (107 lb 9.4 oz)    Exam:   General:  nad  Cardiovascular:  RRR  Respiratory: Coarse diffuse breath sounds.  Abdomen: Soft, nontender, nondistended, positive bowel sounds  Musculoskeletal: No clubbing cyanosis or edema  Data Reviewed: Basic Metabolic Panel:  Recent Labs Lab 05/23/14 1605 05/24/14 0421  NA 130* 136*  K 5.9* 5.1  CL 97 103  CO2 19 19  GLUCOSE 118* 28*  BUN 65* 55*  CREATININE 1.89* 1.73*  CALCIUM 9.4 8.8   Liver Function Tests:  Recent Labs Lab 05/23/14 1605 05/24/14 0421  AST 30 41*  ALT 84* 74*  ALKPHOS 92 93  BILITOT 0.4 0.4  PROT 6.4 5.9*  ALBUMIN 3.0* 2.8*   No results found for this basename: LIPASE, AMYLASE,  in the last 168 hours No results found for this basename: AMMONIA,  in the last 168 hours CBC:  Recent Labs Lab 05/23/14 1605 05/24/14 0421  WBC 6.0 6.1  NEUTROABS 5.4  --   HGB 11.8* 11.5*  HCT 35.9* 33.8*  MCV 98.4 97.7  PLT 190 191   Cardiac Enzymes: No results found for this basename: CKTOTAL, CKMB, CKMBINDEX, TROPONINI,  in the last 168 hours BNP (last 3 results)  Recent Labs  12/06/13 1129  PROBNP 200.0*   CBG:  Recent Labs Lab 05/24/14 0744 05/24/14 1148 05/24/14 1208 05/24/14 1333 05/24/14 1555  GLUCAP 145* 62* 63* 99 85    Recent Results (from the past 240 hour(s))  CULTURE, BLOOD (ROUTINE X 2)     Status: None   Collection Time    05/23/14  4:06 PM      Result Value Ref Range Status   Specimen Description BLOOD RIGHT HAND   Final  Special Requests BOTTLES DRAWN AEROBIC AND ANAEROBIC 3ML   Final   Culture  Setup Time     Final   Value: 05/23/2014 21:29     Performed at [facility]   Culture     Final   Value:        BLOOD CULTURE RECEIVED NO GROWTH TO DATE CULTURE WILL BE HELD FOR 5 DAYS BEFORE ISSUING A FINAL NEGATIVE REPORT     Performed at [facility]   Report Status PENDING   Incomplete  CULTURE, BLOOD (ROUTINE X 2)     Status: None   Collection Time    05/23/14  4:06 PM      Result Value Ref Range Status   Specimen Description  BLOOD BLOOD RIGHT FOREARM   Final   Special Requests BOTTLES DRAWN AEROBIC ONLY 3ML   Final   Culture  Setup Time     Final   Value: 05/23/2014 21:31     Performed at [facility]   Culture     Final   Value:        BLOOD CULTURE RECEIVED NO GROWTH TO DATE CULTURE WILL BE HELD FOR 5 DAYS BEFORE ISSUING A FINAL NEGATIVE REPORT     Performed at [facility]   Report Status PENDING   Incomplete     Studies: Dg Chest 2 View  05/23/2014   CLINICAL DATA:  Fever.  EXAM: CHEST  2 VIEW  COMPARISON:  03/15/2014.  FINDINGS: Mediastinum and hilar structures normal. Cardiac pacer in stable position. Cardiomegaly with normal pulmonary vascularity. Prominent left upper lobe infiltrate consistent with pneumonia. No pleural effusion or pneumothorax. Right shoulder replacement. Postsurgical changes left shoulder with deformity of the left humeral head.  IMPRESSION: Prominent left upper lobe infiltrate consistent with pneumonia.   Electronically Signed   By: Marcello Moores  Register   On: 05/23/2014 17:16   Dg Chest Port 1 View  05/24/2014   CLINICAL DATA:  Pneumonia.  EXAM: PORTABLE CHEST - 1 VIEW  COMPARISON:  05/23/2014.  FINDINGS: Mediastinum and hilar structures are normal. Cardiac pacer with lead tips in the right atrium and right ventricle . Cardiomegaly with pulmonary vascular prominence and diffuse interstitial prominence noted. These are consistent with congestive heart failure. Interstitial pneumonitis cannot be excluded. Previously identified left upper lobe pneumonia most likely represents overlying breast soft tissue. No pneumothorax. Deformity noted of the left ribs consistent with prior trauma. Right shoulder replacement.  IMPRESSION: 1. Previously identified left upper lobe infiltrate most likely represented overlying prominent left breast tissue. 2. New onset congestive heart failure with interstitial pulmonary edema.   Electronically Signed   By: Marcello Moores  Register   On: 05/24/2014 07:28     Scheduled Meds: . ALPRAZolam  0.25 mg Oral BID  . dabigatran  75 mg Oral BID  . diclofenac sodium  2 g Topical QID  . docusate sodium  100 mg Oral BID  . feeding supplement (GLUCERNA SHAKE)  237 mL Oral BID BM  . folic acid  1 mg Oral Daily  . metoprolol  50 mg Oral BID  . multivitamin with minerals  1 tablet Oral Daily  . pantoprazole  40 mg Oral Daily  . piperacillin-tazobactam (ZOSYN)  IV  2.25 g Intravenous Q8H  . thiamine  100 mg Oral Daily  . [START ON 05/25/2014] vancomycin  500 mg Intravenous Q48H   Continuous Infusions:    Principal Problem:   HCAP (healthcare-associated pneumonia) Active Problems:   CKD (chronic kidney  disease), stage III   UTI (lower urinary tract infection)   Hyperkalemia   Chronic kidney disease   PAF (paroxysmal atrial fibrillation)   Chronic systolic CHF (congestive heart failure)   General weakness   Type II or unspecified type diabetes mellitus without mention of complication, not stated as uncontrolled   Malnutrition of moderate degree   Volume overload    Time spent: 35 minutes    [facility] MD [facility] Pager [phone]. If 7PM-7AM, please contact night-coverage at [email or web address], password [facility] 05/24/2014, 6:38 PM  LOS: 1 day

## 2014-05-24 NOTE — Progress Notes (Signed)
CSW received referral for New SNF.  CSW spoke with [facility] who spoke with pt at bedside. Per RNCM, pt stated that she wished to return home with [facility] services at [place]. Per RNCM, pt stated that she does not want to go to a SNF as she has been in the past and does not want to return.   Awaiting PT/OT evaluation for recommendations.  CSW to follow up with pt if PT/OT evaluation recommend SNF to further assess pt disposition plan.  Sophia Oliver, MSW, [place] [facility] [phone]

## 2014-05-24 NOTE — Progress Notes (Signed)
CRITICAL VALUE ALERT  Critical value received:  Glucose 28  Date of notification:  05/24/14  Time of notification:  0521  Critical value read back: yes  Nurse who received alert:  Wilhemina Cash  MD notified (1st page):  Allyson Sabal, N  Time of first page:  [phone]  MD notified (2nd page):  Time of second page:  Responding MD:  Derrek Gu  Time MD responded:  [phone], new orders given.

## 2014-05-24 NOTE — Progress Notes (Addendum)
INITIAL NUTRITION ASSESSMENT  DOCUMENTATION CODES Per approved criteria  -Non-severe (moderate) malnutrition in the context of chronic illness -Underweight  Pt meets criteria for moderate MALNUTRITION in the context of chronic illness as evidenced by 10% body weight loss in 6 months, PO intake <75% for  > one month.   INTERVENTION: -Recommend Glucerna Shake po BID, each supplement provides 220 kcal and 10 grams of protein -Recommend liberalizing diet to encourage PO intake -Will continue to monitor  NUTRITION DIAGNOSIS: Inadequate oral intake related to decreased appetite/food availabilty as evidenced by PO intake <75%, 10% body weight loss in 6 months.   Goal: Pt to meet >/= 90% of their estimated nutrition needs    Monitor:  Diet order, total protein/energy intake, labs, weights  Reason for Assessment: MST/Underweight BMI  80 y.o. female  Admitting Dx: <principal problem not specified>  ASSESSMENT: Sophia Oliver is a 80 y.o. female presents with general weakness.  -Pt's husband reported an overall decreased appetite and weight loss in past 4 weeks.  -Reported that she had been recently dx with DM2 and has been decreasing intake to assist with blood glucose control. Pt also had a rehab stay, where she did not like the food available and decreased intake -Husband noted pt's diet consists of two small meals, an afternoon snack with one Glucerna shake daily. Reviewed basic DM therapy for hypoglycemic and hyperglycemic episodes. Discussed high protein/high kcal nutrition therapy for weight maintenance. -Currently on renal diet d/t ARF and elevated K on admit. BUN/Crt improving, and K now WNL. Would benefit from diet liberalization to encourage PO intake -Endorsed weight loss; pt has lost approximately 10-13 lbs in past 6 months (10% body weight loss, significant for time frame)  Height: Ht Readings from Last 1 Encounters:  05/23/14 5' 4.5" (1.638 m)    Weight: Wt Readings  from Last 1 Encounters:  05/23/14 107 lb 9.4 oz (48.8 kg)    Ideal Body Weight: 120 lbs  % Ideal Body Weight: 89%  Wt Readings from Last 10 Encounters:  05/23/14 107 lb 9.4 oz (48.8 kg)  04/14/14 106 lb (48.081 kg)  03/22/14 107 lb 9.6 oz (48.807 kg)  03/12/14 113 lb 1.6 oz (51.302 kg)  03/07/14 107 lb 12.8 oz (48.898 kg)  01/23/14 114 lb 3.2 oz (51.8 kg)  12/15/13 113 lb (51.256 kg)  12/01/13 122 lb (55.339 kg)  11/16/13 120 lb (54.432 kg)  11/03/13 119 lb (53.978 kg)    Usual Body Weight: 120 lbs  % Usual Body Weight: 90%  BMI:  Body mass index is 18.19 kg/(m^2). Underweight  Estimated Nutritional Needs: Kcal: 1450-1650 Protein: 65-75 gram Fluid: 1200 ml per MD  Skin: WDL  Diet Order: Renal  EDUCATION NEEDS: -Education needs addressed  No intake or output data in the 24 hours ending 05/24/14 1208  Last BM: pta   Labs:   Recent Labs Lab 05/23/14 1605 05/24/14 0421  NA 130* 136*  K 5.9* 5.1  CL 97 103  CO2 19 19  BUN 65* 55*  CREATININE 1.89* 1.73*  CALCIUM 9.4 8.8  GLUCOSE 118* 28*    CBG (last 3)   Recent Labs  05/24/14 0647 05/24/14 0744 05/24/14 1148  GLUCAP 209* 145* 62*    Scheduled Meds: . ALPRAZolam  0.25 mg Oral BID  . dabigatran  75 mg Oral BID  . diclofenac sodium  2 g Topical QID  . docusate sodium  100 mg Oral BID  . feeding supplement (GLUCERNA SHAKE)  237 mL  Oral BID BM  . folic acid  1 mg Oral Daily  . glipiZIDE  2.5 mg Oral QAC breakfast  . metoprolol  50 mg Oral BID  . multivitamin with minerals  1 tablet Oral Daily  . pantoprazole  40 mg Oral Daily  . piperacillin-tazobactam (ZOSYN)  IV  2.25 g Intravenous Q8H  . thiamine  100 mg Oral Daily  . [START ON 05/25/2014] vancomycin  500 mg Intravenous Q48H    Continuous Infusions: . sodium chloride 50 mL/hr at 05/23/14 2045    Past Medical History  Diagnosis Date  . Syncope   . Sinus node dysfunction     a. s/p pacemaker.  . Wide-complex tachycardia   .  Cholelithiasis   . Glucose intolerance (impaired glucose tolerance)   . Anxiety   . PONV (postoperative nausea and vomiting)   . Chronic kidney disease     a. Baseline Cr reported 1.8-2.0.  . Injury of right upper arm     after venipuncture in 3/13 - pt describes pain afterwards- no followup done seen by PCP- no further treatment   . PAF (paroxysmal atrial fibrillation)   . Left bundle branch block   . Tachycardia-bradycardia syndrome   . CHF (congestive heart failure)     a. EF 30-35% by echo 2015.  [name] Pacemaker     Guidant, implanted 2006  . Type II diabetes mellitus     "recently dx'd" (03/09/2014)  . Breast cancer 2003    "radiation on right; mastectomy on the left"  . Hypertension   . GERD (gastroesophageal reflux disease)   . DJD (degenerative joint disease)   . Arthritis     "real bad in my lower back" (03/09/2014)  . Chronic lower back pain   . Orthostatic hypotension     a. Diuretics d/c'd 03/2014.  [name] PAT (paroxysmal atrial tachycardia)     Past Surgical History  Procedure Laterality Date  . Replacement total knee bilateral Bilateral   . Total shoulder replacement    . Tonsillectomy    . Orif distal femur fracture Right 12/2007  . Tonsillectomy    . Mastectomy Left 2003  . Total hip arthroplasty  06/16/2012    Procedure: TOTAL HIP ARTHROPLASTY;  Surgeon: Mauri Pole, MD;  Location: [facility];  Service: Orthopedics;  Laterality: Left;  . Hip closed reduction  06/30/2012    Procedure: CLOSED MANIPULATION HIP;  Surgeon: Mauri Pole, MD;  Location: [facility];  Service: Orthopedics;  Laterality: Left;  . Abdominal hysterectomy    . Pacemaker insertion      Guidant Insignia I  . Cataract extraction w/ intraocular lens  implant, bilateral Bilateral 2000's  . Refractive surgery Bilateral 01/2014  . Inguinal hernia repair Left     "had emergency OR for it a few years ago" (03/09/2014)  . Joint replacement    . Breast biopsy Bilateral     Atlee Abide MS RD LDN Clinical  Dietitian [phone]

## 2014-05-25 LAB — CBC
HEMATOCRIT: 30 % — AB (ref 36.0–46.0)
HEMOGLOBIN: 10 g/dL — AB (ref 12.0–15.0)
MCH: 32.3 pg (ref 26.0–34.0)
MCHC: 33.3 g/dL (ref 30.0–36.0)
MCV: 96.8 fL (ref 78.0–100.0)
Platelets: 189 10*3/uL (ref 150–400)
RBC: 3.1 MIL/uL — ABNORMAL LOW (ref 3.87–5.11)
RDW: 14.9 % (ref 11.5–15.5)
WBC: 5.2 10*3/uL (ref 4.0–10.5)

## 2014-05-25 LAB — GLUCOSE, CAPILLARY
GLUCOSE-CAPILLARY: 52 mg/dL — AB (ref 70–99)
GLUCOSE-CAPILLARY: 179 mg/dL — AB (ref 70–99)
Glucose-Capillary: 52 mg/dL — ABNORMAL LOW (ref 70–99)
Glucose-Capillary: 81 mg/dL (ref 70–99)
Glucose-Capillary: 129 mg/dL — ABNORMAL HIGH (ref 70–99)
Glucose-Capillary: 153 mg/dL — ABNORMAL HIGH (ref 70–99)

## 2014-05-25 LAB — BASIC METABOLIC PANEL
Anion gap: 14 (ref 5–15)
BUN: 46 mg/dL — AB (ref 6–23)
CHLORIDE: 101 meq/L (ref 96–112)
CO2: 22 mEq/L (ref 19–32)
Calcium: 8.5 mg/dL (ref 8.4–10.5)
Creatinine, Ser: 1.85 mg/dL — ABNORMAL HIGH (ref 0.50–1.10)
GFR calc non Af Amer: 23 mL/min — ABNORMAL LOW (ref >90)
GFR, EST AFRICAN AMERICAN: 27 mL/min — AB (ref >90)
GLUCOSE: 38 mg/dL — AB (ref 70–99)
Potassium: 4.8 mEq/L (ref 3.7–5.3)
Sodium: 137 mEq/L (ref 137–147)

## 2014-05-25 LAB — URINE CULTURE
CULTURE: NO GROWTH
Colony Count: NO GROWTH

## 2014-05-25 MED ORDER — INSULIN ASPART 100 UNIT/ML ~~LOC~~ SOLN: 0.0000 [IU] | Freq: Every day | SUBCUTANEOUS | Status: DC

## 2014-05-25 MED ORDER — INSULIN ASPART 100 UNIT/ML ~~LOC~~ SOLN
0.0000 [IU] | Freq: Three times a day (TID) | SUBCUTANEOUS | Status: DC
  Administered 2014-05-26 (×2): 1 [IU] via SUBCUTANEOUS
  Administered 2014-05-27: 3 [IU] via SUBCUTANEOUS
  Administered 2014-05-28 – 2014-05-30 (×3): 1 [IU] via SUBCUTANEOUS

## 2014-05-25 MED ORDER — FUROSEMIDE 20 MG PO TABS
20.0000 mg | ORAL_TABLET | Freq: Once | ORAL | Status: AC
  Administered 2014-05-25: 20 mg via ORAL
  Filled 2014-05-25: qty 1

## 2014-05-25 NOTE — Progress Notes (Signed)
Clinical Social Work Department BRIEF PSYCHOSOCIAL ASSESSMENT 05/25/2014  Patient:  Sophia Oliver, Sophia Oliver     Account Number:  [id number]     Admit date:  05/23/2014  Clinical Social Worker:  Ulyess Blossom  Date/Time:  05/25/2014 02:00 PM  Referred by:  Physician  Date Referred:  05/25/2014 Referred for  SNF Placement   Other Referral:   Interview type:  Patient Other interview type:   and patient family at bedside    PSYCHOSOCIAL DATA Living Status:  [place] Admitted from facility:   Level of care:   Primary support name:  Sophia Oliver/[phone] Primary support relationship to patient:  SPOUSE Degree of support available:   strong    CURRENT CONCERNS Current Concerns  Post-Acute Placement   Other Concerns:    SOCIAL WORK ASSESSMENT / PLAN CSW received notification from [facility] that RNCM had spoken with pt and pt family today following therapy evaluations and pt now agreeable to SNF, but does not want to return to facility that she was at recently.    CSW met with pt, pt husband, and pt friend at bedside. CSW introduced self and explained role. Pt shared that she was hopeful to return home, but she recognizes that she and pt husband are unable to manage her care at home at this time. CSW provided support. CSW discussed recommendation for short term rehab and pt agreeable to [facility] and [facility]. CSW expressed understanding and clarified questions and concerns.    CSW completed FL2 and initiated SNF search to [facility] and [facility] per pt request.    CSW to follow up with pt and pt husband regarding SNF bed offers.    CSW to continue to follow and facilitate pt discharge needs when pt medically ready for discharge.   Assessment/plan status:  Psychosocial Support/Ongoing Assessment of Needs Other assessment/ plan:   discharge planning   Information/referral to community resources:   [facility] list    PATIENT'S/FAMILY'S RESPONSE TO PLAN OF CARE: Pt alert and oriented x 4. Pt family supportive and plan to assist pt in exploring other facilities. Pt hopeful for SNF close to pt home in order for pt husband to be able to visit. Pt appreciative of CSW support and assistance.    Alison Murray, MSW, [place] Work [phone]

## 2014-05-25 NOTE — Evaluation (Signed)
Occupational Therapy Evaluation Patient Details Name: Sophia Oliver MRN: [id number] DOB: 09-02-1924 Today's Date: 05/25/2014    History of Present Illness Pt was admitted for pna.  She has a h/o pacemaker, A Fib, sinus node dysfunction, breast CA and CKD   Clinical Impression   This 80 year old female was admitted for pna.  She was mostly mod I with adls at home but had occasional assistance since she returned home from SNF.  Pt is overall min to mod A for ADLs and goals are mostly supervision level.    Follow Up Recommendations  SNF    Equipment Recommendations  None recommended by OT    Recommendations for Other Services       Precautions / Restrictions Precautions Precautions: Fall Restrictions Weight Bearing Restrictions: No      Mobility Bed Mobility Overal bed mobility: Needs Assistance Bed Mobility: Supine to Sit;Sit to Supine     Supine to sit: Min assist Sit to supine: Min assist   General bed mobility comments: assist for trunk to sit up and assist for legs for back to bed  Transfers Overall transfer level: Needs assistance Equipment used: 1 person hand held assist Transfers: Sit to/from Stand Sit to Stand: Min assist         General transfer comment: assist to rise and steady    Balance                                            ADL Overall ADL's : Needs assistance/impaired     Grooming: Set up;Sitting   Upper Body Bathing: Set up;Sitting   Lower Body Bathing: Minimal assistance;Sit to/from stand   Upper Body Dressing : Set up;Sitting   Lower Body Dressing: Moderate assistance;Sit to/from stand                 General ADL Comments: pt sat eob to brush teeth.  Stood and sidestepped up bed.  Pt c/o poor night of sleep.  able to reach to feet, but arms shaky     Vision                     Perception     Praxis      Pertinent Vitals/Pain Pt has a headache--not rated.  Requested Tylenol     Hand  Dominance     Extremity/Trunk Assessment Upper Extremity Assessment Upper Extremity Assessment: RUE deficits/detail;LUE deficits/detail RUE Deficits / Details: can lift to approximately 9; s/p TSA.  arthritic changes bil LUE Deficits / Details: can lift about 30 degrees actively; s/p RCR.  Uses L to support R with functional activities such as brushing teeth           Communication Communication Communication: No difficulties   Cognition Arousal/Alertness: Awake/alert Behavior During Therapy: WFL for tasks assessed/performed Overall Cognitive Status: Within Functional Limits for tasks assessed                     General Comments       Exercises       Shoulder Instructions      Home Living Family/patient expects to be discharged to:: Unsure Living Arrangements: Spouse/significant other Available Help at Discharge: Available 24 hours/day;Family Type of Home: House Home Access: Stairs to enter     Home Layout: Two level;1/2 bath on main level;Able to live on main  level with bedroom/bathroom         Bathroom Toilet: Standard     Home Equipment: Walker - 2 wheels;Bedside commode;Shower seat;Cane - single point          Prior Functioning/Environment Level of Independence: Needs assistance        Comments: had HHPT/OT at home, since release from SNF    OT Diagnosis: Generalized weakness   OT Problem List: Decreased strength;Decreased activity tolerance;Impaired balance (sitting and/or standing)   OT Treatment/Interventions: Self-care/ADL training;Patient/family education;Balance training;Energy conservation    OT Goals(Current goals can be found in the care plan section) Acute Rehab OT Goals Patient Stated Goal: get strength back OT Goal Formulation: With patient Time For Goal Achievement: 06/08/14 Potential to Achieve Goals: Good ADL Goals Pt Will Perform Grooming: with supervision;standing Pt Will Perform Lower Body Bathing: with  supervision;sit to/from stand Pt Will Perform Lower Body Dressing: with supervision;sit to/from stand Pt Will Transfer to Toilet: with min guard assist;ambulating;bedside commode Pt Will Perform Toileting - Clothing Manipulation and hygiene: with supervision;sit to/from stand  OT Frequency: Min 2X/week   Barriers to D/C:            Co-evaluation              End of Session    Activity Tolerance: Patient limited by fatigue Patient left: in bed;with call bell/phone within reach   Time: 0952-1006 OT Time Calculation (min): 14 min Charges:  OT General Charges $OT Visit: 1 Procedure OT Evaluation $Initial OT Evaluation Tier I: 1 Procedure OT Treatments $Self Care/Home Management : 8-22 mins G-Codes:    Dacotah Cabello 28-May-2014, 10:30 AM Lesle Chris, OTR/L [phone] May 28, 2014

## 2014-05-25 NOTE — Evaluation (Signed)
Physical Therapy Evaluation Patient Details Name: Sophia Oliver MRN: [id number] DOB: April 09, 1924 Today's Date: 05/25/2014   History of Present Illness  Pt was admitted for pna.  She has a h/o pacemaker, A Fib, sinus node dysfunction, breast CA and CKD  Clinical Impression  On eval, pt required Min assist for mobility-able to ambulate ~50 feet with rolling walker. Demonstrates general weakness, decreased activity tolerance, and impaired gait and balance. Recommend ST rehab at SNF.     Follow Up Recommendations SNF    Equipment Recommendations  None recommended by PT    Recommendations for Other Services OT consult     Precautions / Restrictions Precautions Precautions: Fall Restrictions Weight Bearing Restrictions: No      Mobility  Bed Mobility Overal bed mobility: Needs Assistance Bed Mobility: Supine to Sit     Supine to sit: Min assist;HOB elevated     General bed mobility comments: assist for trunk to sit up. Increased time.   Transfers Overall transfer level: Needs assistance Equipment used: Rolling walker (2 wheeled) Transfers: Sit to/from Stand Sit to Stand: Min assist         General transfer comment: assist to rise, stabilize, control descent. VCS safety, technique, hand placement.   Ambulation/Gait Ambulation/Gait assistance: Min assist Ambulation Distance (Feet): 50 Feet Assistive device: Rolling walker (2 wheeled) Gait Pattern/deviations: Trunk flexed;Decreased stride length;Decreased step length - right;Decreased step length - left     General Gait Details: assist to stabilize and maneuver with walker. moderately unsteady. fatigues easily. dyspnea 2/4.   Stairs            Wheelchair Mobility    Modified Rankin (Stroke Patients Only)       Balance Overall balance assessment: Needs assistance         Standing balance support: Bilateral upper extremity supported;During functional activity Standing balance-Leahy Scale: Poor                                Pertinent Vitals/Pain Headache 2/10. Medicated prior to session per pt.     Home Living                        Prior Function                 Hand Dominance        Extremity/Trunk Assessment   Upper Extremity Assessment: Defer to OT evaluation           Lower Extremity Assessment: Generalized weakness;LLE deficits/detail;RLE deficits/detail      Cervical / Trunk Assessment: Kyphotic  Communication      Cognition Arousal/Alertness: Awake/alert Behavior During Therapy: WFL for tasks assessed/performed Overall Cognitive Status: Within Functional Limits for tasks assessed                      General Comments      Exercises        Assessment/Plan    PT Assessment Patient needs continued PT services  PT Diagnosis Difficulty walking;Generalized weakness;Acute pain   PT Problem List Decreased strength;Decreased activity tolerance;Decreased balance;Decreased mobility;Pain;Decreased knowledge of use of DME  PT Treatment Interventions DME instruction;Gait training;Functional mobility training;Therapeutic activities;Therapeutic exercise;Patient/family education;Balance training   PT Goals (Current goals can be found in the Care Plan section) Acute Rehab PT Goals Patient Stated Goal: get strength back. "to do for myself" PT Goal Formulation: With patient/family Time For Goal Achievement:  06/08/14 Potential to Achieve Goals: Good    Frequency Min 3X/week   Barriers to discharge        Co-evaluation               End of Session Equipment Utilized During Treatment: Gait belt Activity Tolerance: Patient limited by fatigue Patient left: in chair;with call bell/phone within reach;with family/visitor present           Time: 7782-4235 PT Time Calculation (min): 12 min   Charges:   PT Evaluation $Initial PT Evaluation Tier I: 1 Procedure PT Treatments $Gait Training: 8-22 mins   PT G Codes:           Weston Anna, MPT Pager: [phone]

## 2014-05-25 NOTE — Progress Notes (Signed)
Patient ID: Sophia Oliver, female   DOB: 25-Feb-1924, 80 y.o.   MRN: [id number]  [facility] PROGRESS NOTE  ATLEE VILLERS CMK:[id number] DOB: [birth year] DOA: 05/23/2014 PCP: [name], MD  Brief narrative: 80 y.o. female presented with main concern of progressive weakness, subjective fevers, chills, cough, exertional shortness of breath. Was recently treated for UTI as well with Macrobid but her symptoms of urinary frequency have not improved. She has been referred for an admission for PNA, UTI.   Principal Problem:   HCAP (healthcare-associated pneumonia) - initially started on Vanc and Zosyn, repeat CXR with worsening pulmonary edema but no signs of PNA - pt has remained afebrile and ABX changed to Levaquin due to concomitant UTI  - pt remains hemodynamically stable, maintaining oxygen saturation at target range, WBC stable and WNL for 72 hours  Active Problems:   CKD (chronic kidney disease), stage III - Cr remains stable at 1.7 - 1.8 - repeat BMP in AM   UTI (lower urinary tract infection) - currently on Levaquin - follow up on urine culture    Pacemaker - stable    Hyperkalemia - remains stable and WNL - repeat BMP in AM   PAF (paroxysmal atrial fibrillation) - rate controlled - continue Pradaxa and Metoprolol    Chronic systolic and diastolic CHF (congestive heart failure), acute flare  - severe systolic CHF per last 2 D ECHO EF 20%, grade II diastolic CHF - CXR with worsening pulmonary edema - give one dose of Lasix 20 mg PO due to soft BP - monitor daily weights, I's and O's - weight this AM 107 lbs    Type II or unspecified type diabetes mellitus without mention of complication, not stated as uncontrolled - reasonable inpatient control    Malnutrition, severe - advance diet as pt tolerating    Acute on chronic blood loss anemia - no signs of active bleeding - repeat CBC in AM   DVT prophylaxis - on Pradaxa    Consultants:  None   Procedures/Studies: CXR  05/23/2014  Prominent left upper lobe infiltrate consistent with pneumonia.    CXR 05/24/2014  Previously identified LUL infiltrate most likely represented overlying prominent left breast tissue. New onset congestive heart failure with interstitial pulmonary edema.     Antibiotics:  Vancomycin 7/20 -->  Zosyn 7/20 -->  Code Status: Full Family Communication: Pt at bedside Disposition Plan: Recommend SNF  HPI/Subjective: No events overnight.   Objective: Filed Vitals:   05/24/14 0518 05/24/14 1330 05/24/14 2039 05/25/14 0507  BP:  113/82 95/45 93/50   Pulse:  65 65 66  Temp: 98.2 F (36.8 C) 98.1 F (36.7 C) 98.3 F (36.8 C) 98 F (36.7 C)  TempSrc: Oral Oral Oral Oral  Resp:  16 16 16   Height:      Weight:      SpO2:  100% 100% 99%    Intake/Output Summary (Last 24 hours) at 05/25/14 1351 Last data filed at 05/25/14 1100  Gross per 24 hour  Intake    280 ml  Output    750 ml  Net   -470 ml    Exam:   General:  Pt is alert, follows commands appropriately, not in acute distress  Cardiovascular: Irregular rate and rhythm, no rubs, no gallops  Respiratory: Clear to auscultation bilaterally, no wheezing, bibasilar crackles   Abdomen: Soft, non tender, non distended, bowel sounds present, no guarding  Extremities: pulses DP and PT palpable bilaterally  Neuro: Grossly nonfocal  Data  Reviewed: Basic Metabolic Panel:  Recent Labs Lab 05/23/14 1605 05/24/14 0421 05/25/14 0445  NA 130* 136* 137  K 5.9* 5.1 4.8  CL 97 103 101  CO2 19 19 22   GLUCOSE 118* 28* 38*  BUN 65* 55* 46*  CREATININE 1.89* 1.73* 1.85*  CALCIUM 9.4 8.8 8.5   Liver Function Tests:  Recent Labs Lab 05/23/14 1605 05/24/14 0421  AST 30 41*  ALT 84* 74*  ALKPHOS 92 93  BILITOT 0.4 0.4  PROT 6.4 5.9*  ALBUMIN 3.0* 2.8*   CBC:  Recent Labs Lab 05/23/14 1605 05/24/14 0421 05/25/14 0445  WBC 6.0 6.1 5.2  NEUTROABS 5.4  --   --   HGB 11.8* 11.5* 10.0*  HCT 35.9* 33.8* 30.0*   MCV 98.4 97.7 96.8  PLT 190 191 189   CBG:  Recent Labs Lab 05/24/14 2343 05/25/14 0435 05/25/14 0546 05/25/14 0723 05/25/14 1156  GLUCAP 118* 52* 52* 81 179*    Recent Results (from the past 240 hour(s))  CULTURE, BLOOD (ROUTINE X 2)     Status: None   Collection Time    05/23/14  4:06 PM      Result Value Ref Range Status   Specimen Description BLOOD RIGHT HAND   Final   Special Requests BOTTLES DRAWN AEROBIC AND ANAEROBIC 3ML   Final   Culture  Setup Time     Final   Value: 05/23/2014 21:29     Performed at [facility]   Culture     Final   Value:        BLOOD CULTURE RECEIVED NO GROWTH TO DATE CULTURE WILL BE HELD FOR 5 DAYS BEFORE ISSUING A FINAL NEGATIVE REPORT     Performed at [facility]   Report Status PENDING   Incomplete  CULTURE, BLOOD (ROUTINE X 2)     Status: None   Collection Time    05/23/14  4:06 PM      Result Value Ref Range Status   Specimen Description BLOOD BLOOD RIGHT FOREARM   Final   Special Requests BOTTLES DRAWN AEROBIC ONLY 3ML   Final   Culture  Setup Time     Final   Value: 05/23/2014 21:31     Performed at [facility]   Culture     Final   Value:        BLOOD CULTURE RECEIVED NO GROWTH TO DATE CULTURE WILL BE HELD FOR 5 DAYS BEFORE ISSUING A FINAL NEGATIVE REPORT     Performed at [facility]   Report Status PENDING   Incomplete     Scheduled Meds: . ALPRAZolam  0.25 mg Oral BID  . dabigatran  75 mg Oral BID  . diclofenac sodium  2 g Topical QID  . docusate sodium  100 mg Oral BID  . feeding supplement (GLUCERNA SHAKE)  237 mL Oral BID BM  . folic acid  1 mg Oral Daily  . insulin aspart  0-5 Units Subcutaneous QHS  . insulin aspart  0-9 Units Subcutaneous TID WC  . [START ON 05/26/2014] levofloxacin (LEVAQUIN) IV  250 mg Intravenous Q48H  . metoprolol  50 mg Oral BID  . multivitamin with minerals  1 tablet Oral Daily  . pantoprazole  40 mg Oral Daily  . thiamine  100 mg Oral Daily    Continuous Infusions:    Faye Ramsay, MD  [facility] Pager [phone]  If 7PM-7AM, please contact night-coverage [email or web address] Password [email or web address] 05/25/2014, 1:51 PM  LOS: 2 days

## 2014-05-25 NOTE — Progress Notes (Addendum)
Clinical Social Work Department CLINICAL SOCIAL WORK PLACEMENT NOTE 05/25/2014  Patient:  Sophia Oliver, Sophia Oliver  Account Number:  [id number] Admit date:  05/23/2014  Clinical Social Worker:  Ulyess Blossom  Date/time:  05/25/2014 02:23 PM  Clinical Social Work is seeking post-discharge placement for this patient at the following level of care:   SKILLED NURSING   (*CSW will update this form in Epic as items are completed)   05/25/2014  Patient/family provided with [place] Department of Clinical Social Work's list of facilities offering this level of care within the geographic area requested by the patient (or if unable, by the patient's family).  05/25/2014  Patient/family informed of their freedom to choose among providers that offer the needed level of care, that participate in Medicare, Medicaid or managed care program needed by the patient, have an available bed and are willing to accept the patient.  05/25/2014  Patient/family informed of [facility]' ownership interest in [facility], as well as of the fact that they are under no obligation to receive care at this facility.  PASARR submitted to EDS on 05/25/2014 PASARR number received on 05/25/2014  FL2 transmitted to all facilities in geographic area requested by pt/family on  05/25/2014 FL2 transmitted to all facilities within larger geographic area on   Patient informed that his/her managed care company has contracts with or will negotiate with  certain facilities, including the following:     Patient/family informed of bed offers received:  05/26/2014 Patient chooses bed at [facility] Physician recommends and patient chooses bed at    Patient to be transferred to  on  05/30/2014 Patient to be transferred to facility by [facility] Patient and family notified of transfer on 05/30/2014  Name of family member notified:  Pt notified at bedside, pt husband, Mikki Santee notified via telephone  The following physician  request were entered in Epic:   Additional Comments:   Alison Murray, MSW, [place] Work [phone]

## 2014-05-26 LAB — CBC
HCT: 29.5 % — ABNORMAL LOW (ref 36.0–46.0)
Hemoglobin: 9.8 g/dL — ABNORMAL LOW (ref 12.0–15.0)
MCH: 32.3 pg (ref 26.0–34.0)
MCHC: 33.2 g/dL (ref 30.0–36.0)
MCV: 97.4 fL (ref 78.0–100.0)
Platelets: 193 10*3/uL (ref 150–400)
RBC: 3.03 MIL/uL — AB (ref 3.87–5.11)
RDW: 15.2 % (ref 11.5–15.5)
WBC: 5.3 10*3/uL (ref 4.0–10.5)

## 2014-05-26 LAB — GLUCOSE, CAPILLARY
GLUCOSE-CAPILLARY: 133 mg/dL — AB (ref 70–99)
Glucose-Capillary: 89 mg/dL (ref 70–99)
Glucose-Capillary: 146 mg/dL — ABNORMAL HIGH (ref 70–99)
Glucose-Capillary: 183 mg/dL — ABNORMAL HIGH (ref 70–99)

## 2014-05-26 LAB — BASIC METABOLIC PANEL
Anion gap: 13 (ref 5–15)
BUN: 48 mg/dL — AB (ref 6–23)
CALCIUM: 8.6 mg/dL (ref 8.4–10.5)
CO2: 23 meq/L (ref 19–32)
CREATININE: 1.82 mg/dL — AB (ref 0.50–1.10)
Chloride: 101 mEq/L (ref 96–112)
GFR calc Af Amer: 28 mL/min — ABNORMAL LOW (ref >90)
GFR calc non Af Amer: 24 mL/min — ABNORMAL LOW (ref >90)
GLUCOSE: 130 mg/dL — AB (ref 70–99)
Potassium: 5.1 mEq/L (ref 3.7–5.3)
Sodium: 137 mEq/L (ref 137–147)

## 2014-05-26 LAB — URINE CULTURE
Colony Count: NO GROWTH
Culture: NO GROWTH

## 2014-05-26 MED ORDER — FUROSEMIDE 20 MG PO TABS
20.0000 mg | ORAL_TABLET | Freq: Once | ORAL | Status: AC
  Administered 2014-05-26: 20 mg via ORAL
  Filled 2014-05-26: qty 1

## 2014-05-26 NOTE — Progress Notes (Addendum)
Dr. Doyle Askew, this patient c/o pain and tenderness to both of her ears.  Patient reports she has been using voltaren but it is not helping.  She reports that it is a burning pain.  She wakes up feeling like they are "on fire".  She also reports she has not had a BM in a few days so may need something to help with this aswell.  Thanks, Sophia Oliver

## 2014-05-26 NOTE — Progress Notes (Signed)
CSW continuing to follow for disposition planning.  CSW met with pt and pt husband, Mikki Santee at bedside.  CSW discussed SNF bed offers with pt and pt husband. Pt also requested CSW contacted pt son via telephone to discuss.   Pt son discussed with pt about returning to [facility] and initially pt considered returning, but then shared with this CSW that she wanted to go to another facility and CSW expressed understanding and continued to discuss other options.  CSW assisted pt and pt husband in exploring the distance of the bed offers from pt home as pt is concerned about pt husband driving a long distance to visit at [facility].   Pt and pt husband choose [facility] and facility was able to offer pt a private room in which pt wished for.  CSW confirmed with [facility] bed availability.  CSW to continue to follow and facilitate pt discharge needs to [facility] when pt medically ready for discharge.  Sophia Oliver, MSW, [place] Work [phone]

## 2014-05-26 NOTE — Plan of Care (Signed)
Problem: Consults Goal: Skin Care Protocol Initiated - if Braden Score 18 or less If consults are not indicated, leave blank or document N/A  Outcome: Progressing  Barrier cream to buttocks every shift andPRN

## 2014-05-26 NOTE — Progress Notes (Signed)
Patient ID: Sophia Oliver, female   DOB: 29-Oct-1924, 80 y.o.   MRN: [id number]  [facility] PROGRESS NOTE  FELICIANA NARAYAN MBT:[id number] DOB: 01/24/1924 DOA: 05/23/2014 PCP: [name], MD  Brief narrative:  80 y.o. female presented with main concern of progressive weakness, subjective fevers, chills, cough, exertional shortness of breath. Was recently treated for UTI as well with Macrobid but her symptoms of urinary frequency have not improved. She has been referred for an admission for PNA, UTI.   Principal Problem:  HCAP (healthcare-associated pneumonia)  - initially started on Vanc and Zosyn, repeat CXR with worsening pulmonary edema but no signs of PNA  - pt has remained afebrile and ABX changed to Levaquin due to concomitant UTI  - pt remains hemodynamically stable, maintaining oxygen saturation at target range, WBC stable and WNL for 72 hours  Active Problems:  CKD (chronic kidney disease), stage III  - Cr remains stable at 1.7 - 1.8  - repeat BMP in AM UTI (lower urinary tract infection)  - currently on Levaquin  - follow up on urine culture  Pacemaker  - stable  Hyperkalemia  - remains stable and WNL  - repeat BMP in AM  PAF (paroxysmal atrial fibrillation)  - rate controlled  - continue Pradaxa and Metoprolol  Chronic systolic and diastolic CHF (congestive heart failure), acute flare  - severe systolic CHF per last 2 D ECHO EF 20%, grade II diastolic CHF  - CXR with worsening pulmonary edema 7/21 - gave one dose of Lasix 05/25/2014 and pt has responded well, reports feeling better - monitor daily weights, I's and O's  - weight trend: 107 lbs --> 106 lbs  Type II or unspecified type diabetes mellitus without mention of complication, not stated as uncontrolled  - reasonable inpatient control  Malnutrition, severe  - advance diet as pt tolerating  Acute on chronic blood loss anemia  - no signs of active bleeding  - Hg remains stable  - repeat CBC in AM  DVT  prophylaxis  - on Pradaxa   Consultants:  None  Procedures/Studies:  CXR 05/23/2014 Prominent left upper lobe infiltrate consistent with pneumonia.  CXR 05/24/2014 Previously identified LUL infiltrate most likely represented overlying prominent left breast tissue. New onset congestive heart failure with interstitial pulmonary edema.  Antibiotics:  Vancomycin 7/20 -->  Zosyn 7/20 -->  Code Status: Full  Family Communication: Pt at bedside  Disposition Plan: Recommend SNF, placement in progress, possibly in 1-2 days    HPI/Subjective: No events overnight.   Objective: Filed Vitals:   05/25/14 1300 05/25/14 2158 05/26/14 0534 05/26/14 0950  BP: 136/45 113/89 105/80 114/53  Pulse: 64 65 65 63  Temp: 98.6 F (37 C) 97.2 F (36.2 C) 97.5 F (36.4 C)   TempSrc: Oral Oral Oral   Resp: 16 16 16    Height:      Weight:   48.49 kg (106 lb 14.4 oz)   SpO2: 100% 98% 100%     Intake/Output Summary (Last 24 hours) at 05/26/14 1147 Last data filed at 05/26/14 0534  Gross per 24 hour  Intake    460 ml  Output    400 ml  Net     60 ml    Exam:   General:  Pt is alert, follows commands appropriately, not in acute distress  Cardiovascular: Regular rate and rhythm,  no rubs, no gallops  Respiratory: Clear to auscultation bilaterally, no wheezing, mild bibasilar rales   Abdomen: Soft, non tender, non  distended, bowel sounds present, no guarding  Extremities: No edema, pulses DP and PT palpable bilaterally   Data Reviewed: Basic Metabolic Panel:  Recent Labs Lab 05/23/14 1605 05/24/14 0421 05/25/14 0445 05/26/14 0057  NA 130* 136* 137 137  K 5.9* 5.1 4.8 5.1  CL 97 103 101 101  CO2 19 19 22 23   GLUCOSE 118* 28* 38* 130*  BUN 65* 55* 46* 48*  CREATININE 1.89* 1.73* 1.85* 1.82*  CALCIUM 9.4 8.8 8.5 8.6   Liver Function Tests:  Recent Labs Lab 05/23/14 1605 05/24/14 0421  AST 30 41*  ALT 84* 74*  ALKPHOS 92 93  BILITOT 0.4 0.4  PROT 6.4 5.9*  ALBUMIN 3.0*  2.8*   CBC:  Recent Labs Lab 05/23/14 1605 05/24/14 0421 05/25/14 0445 05/26/14 0057  WBC 6.0 6.1 5.2 5.3  NEUTROABS 5.4  --   --   --   HGB 11.8* 11.5* 10.0* 9.8*  HCT 35.9* 33.8* 30.0* 29.5*  MCV 98.4 97.7 96.8 97.4  PLT 190 191 189 193    CBG:  Recent Labs Lab 05/25/14 0723 05/25/14 1156 05/25/14 1703 05/25/14 2146 05/26/14 0752  GLUCAP 81 179* 129* 153* 89    Recent Results (from the past 240 hour(s))  CULTURE, BLOOD (ROUTINE X 2)     Status: None   Collection Time    05/23/14  4:06 PM      Result Value Ref Range Status   Specimen Description BLOOD RIGHT HAND   Final   Special Requests BOTTLES DRAWN AEROBIC AND ANAEROBIC 3ML   Final   Culture  Setup Time     Final   Value: 05/23/2014 21:29     Performed at [facility]   Culture     Final   Value:        BLOOD CULTURE RECEIVED NO GROWTH TO DATE CULTURE WILL BE HELD FOR 5 DAYS BEFORE ISSUING A FINAL NEGATIVE REPORT     Performed at [facility]   Report Status PENDING   Incomplete  CULTURE, BLOOD (ROUTINE X 2)     Status: None   Collection Time    05/23/14  4:06 PM      Result Value Ref Range Status   Specimen Description BLOOD BLOOD RIGHT FOREARM   Final   Special Requests BOTTLES DRAWN AEROBIC ONLY 3ML   Final   Culture  Setup Time     Final   Value: 05/23/2014 21:31     Performed at [facility]   Culture     Final   Value:        BLOOD CULTURE RECEIVED NO GROWTH TO DATE CULTURE WILL BE HELD FOR 5 DAYS BEFORE ISSUING A FINAL NEGATIVE REPORT     Performed at [facility]   Report Status PENDING   Incomplete  URINE CULTURE     Status: None   Collection Time    05/24/14 11:45 AM      Result Value Ref Range Status   Specimen Description URINE, CATHETERIZED   Final   Special Requests NONE   Final   Culture  Setup Time     Final   Value: 05/24/2014 17:47     Performed at [facility]     Final   Value: NO GROWTH     Performed at [facility]   Culture     Final   Value: NO GROWTH     Performed at [facility]   Report Status 05/25/2014 FINAL   Final     Scheduled Meds: . ALPRAZolam  0.25 mg Oral BID  . dabigatran  75 mg Oral BID  . diclofenac sodium  2 g Topical QID  . docusate sodium  100 mg Oral BID  . feeding supplement (GLUCERNA SHAKE)  237 mL Oral BID BM  . folic acid  1 mg Oral Daily  . insulin aspart  0-5 Units Subcutaneous QHS  . insulin aspart  0-9 Units Subcutaneous TID WC  . levofloxacin (LEVAQUIN) IV  250 mg Intravenous Q48H  . metoprolol  50 mg Oral BID  . multivitamin with minerals  1 tablet Oral Daily  . pantoprazole  40 mg Oral Daily  . thiamine  100 mg Oral Daily   Continuous Infusions:    Faye Ramsay, MD  [facility] Pager [phone]  If 7PM-7AM, please contact night-coverage [email or web address] Password [facility] 05/26/2014, 11:47 AM   LOS: 3 days

## 2014-05-27 ENCOUNTER — Inpatient Hospital Stay (HOSPITAL_COMMUNITY): Payer: Medicare Other

## 2014-05-27 LAB — BASIC METABOLIC PANEL
ANION GAP: 14 (ref 5–15)
BUN: 50 mg/dL — ABNORMAL HIGH (ref 6–23)
CHLORIDE: 100 meq/L (ref 96–112)
CO2: 22 meq/L (ref 19–32)
Calcium: 8.8 mg/dL (ref 8.4–10.5)
Creatinine, Ser: 1.97 mg/dL — ABNORMAL HIGH (ref 0.50–1.10)
GFR calc Af Amer: 25 mL/min — ABNORMAL LOW (ref >90)
GFR calc non Af Amer: 22 mL/min — ABNORMAL LOW (ref >90)
Glucose, Bld: 213 mg/dL — ABNORMAL HIGH (ref 70–99)
Potassium: 5 mEq/L (ref 3.7–5.3)
SODIUM: 136 meq/L — AB (ref 137–147)

## 2014-05-27 LAB — GLUCOSE, CAPILLARY
GLUCOSE-CAPILLARY: 255 mg/dL — AB (ref 70–99)
GLUCOSE-CAPILLARY: 144 mg/dL — AB (ref 70–99)
Glucose-Capillary: 119 mg/dL — ABNORMAL HIGH (ref 70–99)
Glucose-Capillary: 122 mg/dL — ABNORMAL HIGH (ref 70–99)

## 2014-05-27 LAB — CBC
HCT: 30 % — ABNORMAL LOW (ref 36.0–46.0)
Hemoglobin: 10.1 g/dL — ABNORMAL LOW (ref 12.0–15.0)
MCH: 33.2 pg (ref 26.0–34.0)
MCHC: 33.7 g/dL (ref 30.0–36.0)
MCV: 98.7 fL (ref 78.0–100.0)
PLATELETS: 212 10*3/uL (ref 150–400)
RBC: 3.04 MIL/uL — AB (ref 3.87–5.11)
RDW: 15.2 % (ref 11.5–15.5)
WBC: 5.6 10*3/uL (ref 4.0–10.5)

## 2014-05-27 MED ORDER — LEVOFLOXACIN 250 MG PO TABS
250.0000 mg | ORAL_TABLET | ORAL | Status: DC
  Administered 2014-05-28 – 2014-05-30 (×2): 250 mg via ORAL
  Filled 2014-05-27 (×2): qty 1

## 2014-05-27 MED ORDER — HYDROCORTISONE 2.5 % RE CREA
TOPICAL_CREAM | Freq: Three times a day (TID) | RECTAL | Status: DC
  Administered 2014-05-27 – 2014-05-30 (×10): via TOPICAL
  Filled 2014-05-27: qty 28.35

## 2014-05-27 NOTE — Consult Note (Addendum)
WOC wound consult note Reason for Consult: Consult requested for ears.  Pt states she has burning pain to bilat earlobes which has been occurring for several months. She was assessed by a dermatologist as an outpatient who gave her some topical cream which she has been applying every day but it has not helped. She is unsure of the name of this medication. Upper ears are dry and have small fissures.  There are no open wounds, drainage, or odor to the site of the pain in her earlobes.  No role for WOC nurse or dressings at this time.  Recommend pt obtain a consult with dermatology or ENT after discharge for further evaluation. Please re-consult if further assistance is needed.  Thank-you,  Julien Girt MSN, [place], [place], [place], [place]

## 2014-05-27 NOTE — Progress Notes (Signed)
Patient ID: Sophia Oliver, female   DOB: 04-14-1924, 80 y.o.   MRN: [id number]  [facility] PROGRESS NOTE  Sophia Oliver YBO:[id number] DOB: 30-Aug-1924 DOA: 05/23/2014 PCP: [name], MD  Brief narrative:  80 y.o. female presented with main concern of progressive weakness, subjective fevers, chills, cough, exertional shortness of breath. Was recently treated for UTI as well with Macrobid but her symptoms of urinary frequency have not improved. She has been referred for an admission for PNA, UTI.   Principal Problem:  HCAP (healthcare-associated pneumonia)  - initially started on Vanc and Zosyn, repeat CXR with worsening pulmonary edema but no signs of PNA  - pt has remained afebrile and ABX changed to Levaquin due to concomitant UTI  - pt remains hemodynamically stable, maintaining oxygen saturation at target range, WBC stable and WNL for 72 hours  - repeat CXR today  Active Problems:  CKD (chronic kidney disease), stage III  - Cr remains stable at 1.7 - 1.9 - repeat BMP in AM  UTI (lower urinary tract infection)  - currently on Levaquin  - follow up on urine culture  Pacemaker  - stable  Hyperkalemia  - remains stable and WNL  - repeat BMP in AM  PAF (paroxysmal atrial fibrillation)  - rate controlled  - continue Pradaxa and Metoprolol  Chronic systolic and diastolic CHF (congestive heart failure), acute flare  - severe systolic CHF per last 2 D ECHO EF 20%, grade II diastolic CHF  - CXR with worsening pulmonary edema 7/21  - gave one dose of Lasix 05/25/2014 and pt has responded well, reports feeling better  - monitor daily weights, I's and O's  - weight trend: 107 lbs --> 106 lbs --> 108 lbs Type II or unspecified type diabetes mellitus without mention of complication, not stated as uncontrolled  - reasonable inpatient control  Malnutrition, severe  - advanced diet as pt tolerating and doing well  Acute on chronic blood loss anemia  - no signs of active bleeding  - Hg  remains stable  - repeat CBC in AM  DVT prophylaxis  - on Pradaxa   Consultants:  None  Procedures/Studies:  CXR 05/23/2014 Prominent left upper lobe infiltrate consistent with pneumonia.  CXR 05/24/2014 Previously identified LUL infiltrate most likely represented overlying prominent left breast tissue. New onset congestive heart failure with interstitial pulmonary edema.  Antibiotics:  Vancomycin 7/20 --> 7/21 Zosyn 7/20 --> 7/21 Levaquin 7/21 -->  Code Status: Full  Family Communication: Pt at bedside  Disposition Plan: Recommend SNF, placement in progress, possibly in am  HPI/Subjective: No events overnight.   Objective: Filed Vitals:   05/26/14 1413 05/26/14 2109 05/26/14 2227 05/27/14 0453  BP: 109/46 108/73 114/51 117/44  Pulse: 65 65 64 62  Temp: 97.4 F (36.3 C) 98.2 F (36.8 C) 98.1 F (36.7 C) 98.2 F (36.8 C)  TempSrc: Oral Oral Oral Oral  Resp:  16 16 16   Height:      Weight:    49 kg (108 lb 0.4 oz)  SpO2: 99% 98% 100% 97%    Intake/Output Summary (Last 24 hours) at 05/27/14 1403 Last data filed at 05/27/14 0855  Gross per 24 hour  Intake   1320 ml  Output   1675 ml  Net   -355 ml    Exam:   General:  Pt is alert, follows commands appropriately, not in acute distress  Cardiovascular: Regular rate and rhythm, no rubs, no gallops  Respiratory: Clear to auscultation bilaterally, no  wheezing, diminished breath sounds at bases   Abdomen: Soft, non tender, non distended, bowel sounds present, no guarding  Extremities: No edema, pulses DP and PT palpable bilaterally  Neuro: Grossly nonfocal  Data Reviewed: Basic Metabolic Panel:  Recent Labs Lab 05/23/14 1605 05/24/14 0421 05/25/14 0445 05/26/14 0057 05/27/14 0050  NA 130* 136* 137 137 136*  K 5.9* 5.1 4.8 5.1 5.0  CL 97 103 101 101 100  CO2 19 19 22 23 22   GLUCOSE 118* 28* 38* 130* 213*  BUN 65* 55* 46* 48* 50*  CREATININE 1.89* 1.73* 1.85* 1.82* 1.97*  CALCIUM 9.4 8.8 8.5 8.6 8.8    Liver Function Tests:  Recent Labs Lab 05/23/14 1605 05/24/14 0421  AST 30 41*  ALT 84* 74*  ALKPHOS 92 93  BILITOT 0.4 0.4  PROT 6.4 5.9*  ALBUMIN 3.0* 2.8*   CBC:  Recent Labs Lab 05/23/14 1605 05/24/14 0421 05/25/14 0445 05/26/14 0057 05/27/14 0050  WBC 6.0 6.1 5.2 5.3 5.6  NEUTROABS 5.4  --   --   --   --   HGB 11.8* 11.5* 10.0* 9.8* 10.1*  HCT 35.9* 33.8* 30.0* 29.5* 30.0*  MCV 98.4 97.7 96.8 97.4 98.7  PLT 190 191 189 193 212   CBG:  Recent Labs Lab 05/26/14 1154 05/26/14 1711 05/26/14 2108 05/27/14 0757 05/27/14 1207  GLUCAP 133* 146* 183* 119* 122*    Recent Results (from the past 240 hour(s))  CULTURE, BLOOD (ROUTINE X 2)     Status: None   Collection Time    05/23/14  4:06 PM      Result Value Ref Range Status   Specimen Description BLOOD RIGHT HAND   Final   Special Requests BOTTLES DRAWN AEROBIC AND ANAEROBIC 3ML   Final   Culture  Setup Time     Final   Value: 05/23/2014 21:29     Performed at [facility]   Culture     Final   Value:        BLOOD CULTURE RECEIVED NO GROWTH TO DATE CULTURE WILL BE HELD FOR 5 DAYS BEFORE ISSUING A FINAL NEGATIVE REPORT     Performed at [facility]   Report Status PENDING   Incomplete  CULTURE, BLOOD (ROUTINE X 2)     Status: None   Collection Time    05/23/14  4:06 PM      Result Value Ref Range Status   Specimen Description BLOOD BLOOD RIGHT FOREARM   Final   Special Requests BOTTLES DRAWN AEROBIC ONLY 3ML   Final   Culture  Setup Time     Final   Value: 05/23/2014 21:31     Performed at [facility]   Culture     Final   Value:        BLOOD CULTURE RECEIVED NO GROWTH TO DATE CULTURE WILL BE HELD FOR 5 DAYS BEFORE ISSUING A FINAL NEGATIVE REPORT     Performed at [facility]   Report Status PENDING   Incomplete  URINE CULTURE     Status: None   Collection Time    05/24/14 11:45 AM      Result Value Ref Range Status   Specimen Description URINE, CATHETERIZED    Final   Special Requests NONE   Final   Culture  Setup Time     Final   Value: 05/24/2014 17:47     Performed at [place]  Final   Value: NO GROWTH     Performed at [facility]   Culture     Final   Value: NO GROWTH     Performed at [facility]   Report Status 05/25/2014 FINAL   Final  URINE CULTURE     Status: None   Collection Time    05/25/14  3:57 PM      Result Value Ref Range Status   Specimen Description URINE, CATHETERIZED   Final   Special Requests NONE   Final   Culture  Setup Time     Final   Value: 05/25/2014 19:19     Performed at [place]     Final   Value: NO GROWTH     Performed at [facility]   Culture     Final   Value: NO GROWTH     Performed at [facility]   Report Status 05/26/2014 FINAL   Final     Scheduled Meds: . ALPRAZolam  0.25 mg Oral BID  . dabigatran  75 mg Oral BID  . diclofenac sodium  2 g Topical QID  . docusate sodium  100 mg Oral BID  . feeding supplement (GLUCERNA SHAKE)  237 mL Oral BID BM  . folic acid  1 mg Oral Daily  . hydrocortisone   Topical TID  . insulin aspart  0-5 Units Subcutaneous QHS  . insulin aspart  0-9 Units Subcutaneous TID WC  . [START ON 05/28/2014] levofloxacin  250 mg Oral Q48H  . metoprolol  50 mg Oral BID  . multivitamin with minerals  1 tablet Oral Daily  . pantoprazole  40 mg Oral Daily  . thiamine  100 mg Oral Daily   Continuous Infusions:   Faye Ramsay, MD  [facility] Pager [phone]  If 7PM-7AM, please contact night-coverage [email or web address] Password [email or web address] 05/27/2014, 2:03 PM   LOS: 4 days

## 2014-05-27 NOTE — Progress Notes (Signed)
Physical Therapy Treatment Patient Details Name: Sophia Oliver MRN: [id number] DOB: 07-09-1924 Today's Date: 05/27/2014    History of Present Illness Pt was admitted for pna.  She has a h/o pacemaker, A Fib, sinus node dysfunction, breast CA and CKD    PT Comments    Pt OOB in recliner.  Assited with amb in hallway twice with one sitting rest break.  Plans to D/C to SNF tomorrow.    Follow Up Recommendations  SNF ([facility])     Equipment Recommendations       Recommendations for Other Services       Precautions / Restrictions Precautions Precautions: Fall Restrictions Weight Bearing Restrictions: No    Mobility  Bed Mobility         Supine to sit: Min assist;HOB elevated     General bed mobility comments: Pt OOB in recliner  Transfers Overall transfer level: Needs assistance Equipment used: Rolling walker (2 wheeled) Transfers: Sit to/from Stand Sit to Stand: Min assist         General transfer comment: assist to rise and steady  Ambulation/Gait Ambulation/Gait assistance: Min assist;Mod assist Ambulation Distance (Feet): 60 Feet (30 feet x 2 one sitting rest break) Assistive device: Rolling walker (2 wheeled) Gait Pattern/deviations: Step-to pattern;Step-through pattern;Trunk flexed;Decreased step length - left;Decreased step length - right Gait velocity: decreased   General Gait Details: assist to stabilize and maneuver with walker. moderately unsteady. fatigues easily. dyspnea 2/4.    Stairs            Wheelchair Mobility    Modified Rankin (Stroke Patients Only)       Balance                                    Cognition Arousal/Alertness: Awake/alert Behavior During Therapy: WFL for tasks assessed/performed Overall Cognitive Status: Within Functional Limits for tasks assessed                      Exercises      General Comments        Pertinent Vitals/Pain     Home Living                       Prior Function            PT Goals (current goals can now be found in the care plan section) Progress towards PT goals: Progressing toward goals    Frequency       PT Plan      Co-evaluation             End of Session Equipment Utilized During Treatment: Gait belt Activity Tolerance: Patient limited by fatigue Patient left: in chair;with call bell/phone within reach;with family/visitor present     Time: 1350-1415 PT Time Calculation (min): 25 min  Charges:  $Gait Training: 8-22 mins $Therapeutic Activity: 8-22 mins                    G Codes:      Rica Koyanagi  PTA [facility]  Acute  Rehab Pager      [phone]

## 2014-05-27 NOTE — Progress Notes (Signed)
This patient is receiving Levaquin. Based on criteria approved by the Pharmacy and Therapeutics Committee, this medication is being converted to the equivalent oral dose form. These criteria include:    Afebrile. . The patient is eating (either orally or per tube) and/or has been taking other orally administered medications for at least 24 hours.  . This patient has no evidence of active gastrointestinal bleeding or impaired GI absorption (gastrectomy, short bowel, patient on TNA or NPO).   If you have questions about this conversion, please contact the pharmacy department.  Romeo Rabon, PharmD, pager [phone]. 05/27/2014,10:18 AM.

## 2014-05-27 NOTE — Progress Notes (Signed)
Occupational Therapy Treatment Patient Details Name: Sophia Oliver MRN: [id number] DOB: 26-Jul-1924 Today's Date: 05/27/2014    History of present illness Pt was admitted for pna.  She has a h/o pacemaker, A Fib, sinus node dysfunction, breast CA and CKD   OT comments  Pt progressing slowly:  Unsteady when up and needs min A for safety.  Pt very motivated and tends to push herself.  Encouraged rest breaks for energy conservation  Follow Up Recommendations  SNF    Equipment Recommendations  None recommended by OT    Recommendations for Other Services      Precautions / Restrictions Precautions Precautions: Fall Restrictions Weight Bearing Restrictions: No       Mobility Bed Mobility         Supine to sit: Min assist;HOB elevated     General bed mobility comments: assist for trunk to sit up.  Pt moves slowly and HOB raised  Transfers Overall transfer level: Needs assistance Equipment used: Rolling walker (2 wheeled) Transfers: Sit to/from Stand Sit to Stand: Min assist         General transfer comment: assist to rise and steady    Balance                                   ADL Overall ADL's : Needs assistance/impaired     Grooming: Oral care;Brushing hair;Standing (supervision for teeth:  mod A for hair:  difficulty reaching)                   Toilet Transfer: Minimal assistance;Ambulation;BSC             General ADL Comments: Pt still has catheter.  Practiced transfer and ambulated to bathroom for grooming.  Encouraged rest breaks for energy conservation.  Pt very motivated      Tourist information centre manager   Behavior During Therapy: WFL for tasks assessed/performed Overall Cognitive Status: Within Functional Limits for tasks assessed                       Extremity/Trunk Assessment               Exercises     Shoulder Instructions       General Comments       Pertinent Vitals/ Pain       No c/o pain  Home Living                                          Prior Functioning/Environment              Frequency Min 2X/week     Progress Toward Goals  OT Goals(current goals can now be found in the care plan section)  Progress towards OT goals: Progressing toward goals     Plan      Co-evaluation                 End of Session     Activity Tolerance Patient limited by fatigue   Patient Left in chair;with call bell/phone within reach;with chair alarm set   Nurse Communication  Time: 9702-6378 OT Time Calculation (min): 39 min  Charges: OT General Charges $OT Visit: 1 Procedure OT Treatments $Self Care/Home Management : 23-37 mins  Dravin Lance 05/27/2014, 1:53 PM   Lesle Chris, OTR/L [phone] 05/27/2014

## 2014-05-27 NOTE — Progress Notes (Signed)
CSW continuing to follow for disposition planning.  Per MD, pt not yet medically ready for discharge today, but potential discharge over the weekend.  CSW met with pt at bedside and updated pt. Pt expressed relief that she did not yet have to leave the hospital as she feels that she is not quite stable enough to transition to [facility]. CSW provided support.  CSW contacted pt husband, Mikki Santee via telephone. CSW updated pt husband. Pt husband plans to complete admission paperwork at [facility] today in order for pt to transition during the weekend if medically ready.  CSW spoke with [facility] who confirmed that bed available tomorrow. Per [facility], facility request preliminary d/c summary today. [place] stated that weekend social worker can faxed final discharge summary to [phone] and call weekend supervisor at [phone] to facilitate pt discharge needs.  CSW notified MD of facility request for preliminary discharge summary.  CSW to continue to follow to assist with pt disposition needs.   Alison Murray, MSW, [place] [facility] [phone]

## 2014-05-28 LAB — CBC
HCT: 31.9 % — ABNORMAL LOW (ref 36.0–46.0)
HEMOGLOBIN: 10.5 g/dL — AB (ref 12.0–15.0)
MCH: 32.4 pg (ref 26.0–34.0)
MCHC: 32.9 g/dL (ref 30.0–36.0)
MCV: 98.5 fL (ref 78.0–100.0)
Platelets: 222 10*3/uL (ref 150–400)
RBC: 3.24 MIL/uL — AB (ref 3.87–5.11)
RDW: 14.9 % (ref 11.5–15.5)
WBC: 6.5 10*3/uL (ref 4.0–10.5)

## 2014-05-28 LAB — BASIC METABOLIC PANEL
Anion gap: 13 (ref 5–15)
BUN: 44 mg/dL — AB (ref 6–23)
CO2: 23 meq/L (ref 19–32)
Calcium: 8.9 mg/dL (ref 8.4–10.5)
Chloride: 101 mEq/L (ref 96–112)
Creatinine, Ser: 1.8 mg/dL — ABNORMAL HIGH (ref 0.50–1.10)
GFR calc Af Amer: 28 mL/min — ABNORMAL LOW (ref >90)
GFR calc non Af Amer: 24 mL/min — ABNORMAL LOW (ref >90)
GLUCOSE: 100 mg/dL — AB (ref 70–99)
POTASSIUM: 4.4 meq/L (ref 3.7–5.3)
Sodium: 137 mEq/L (ref 137–147)

## 2014-05-28 LAB — GLUCOSE, CAPILLARY
GLUCOSE-CAPILLARY: 91 mg/dL (ref 70–99)
Glucose-Capillary: 134 mg/dL — ABNORMAL HIGH (ref 70–99)
Glucose-Capillary: 142 mg/dL — ABNORMAL HIGH (ref 70–99)
Glucose-Capillary: 176 mg/dL — ABNORMAL HIGH (ref 70–99)

## 2014-05-28 NOTE — Progress Notes (Signed)
Patient ID: Sophia Oliver, female   DOB: 10-15-1924, 80 y.o.   MRN: [id number] [facility] PROGRESS NOTE  Sophia Oliver OBS:[id number] DOB: 22-Oct-1924 DOA: 05/23/2014 PCP: [name], MD  Brief narrative: 80 y.o. female presented with main concern of progressive weakness, subjective fevers, chills, cough, exertional shortness of breath. Was recently treated for UTI as well with Macrobid but her symptoms of urinary frequency have not improved. She has been referred for an admission for PNA, UTI.   Principal Problem:  HCAP (healthcare-associated pneumonia)  - initially started on Vanc and Zosyn, repeat CXR with worsening pulmonary edema but no signs of PNA  - pt has remained afebrile and ABX changed to Levaquin due to concomitant UTI  - pt remains hemodynamically stable, maintaining oxygen saturation at target range, WBC stable and WNL for 72 hours  - repeat CXR with possibly improving PNA but still not clear, will need follow up CXR in an outpatient setting  Active Problems:  CKD (chronic kidney disease), stage III  - Cr remains stable at 1.7 - 1.9  - repeat BMP in AM  UTI (lower urinary tract infection)  - currently on Levaquin  - follow up on urine culture  Pacemaker  - stable  Hyperkalemia  - K remains stable and WNL  - repeat BMP in AM  PAF (paroxysmal atrial fibrillation)  - rate controlled  - continue Pradaxa and Metoprolol  Chronic systolic and diastolic CHF (congestive heart failure), acute flare  - severe systolic CHF per last 2 D ECHO EF 20%, grade II diastolic CHF  - CXR with worsening pulmonary edema 7/21  - gave one dose of Lasix 05/25/2014 and pt has responded well, reports feeling better  - monitor daily weights, I's and O's  - weight trend: 107 lbs --> 106 lbs --> 108 lbs --> 108 lbs Type II or unspecified type diabetes mellitus without mention of complication, not stated as uncontrolled  - reasonable inpatient control  Malnutrition, severe  - advanced diet as pt  tolerating and doing well  Acute on chronic blood loss anemia  - no signs of active bleeding  - Hg remains stable  - repeat CBC in AM  DVT prophylaxis  - on Pradaxa   Consultants:  None  Procedures/Studies:  CXR 05/23/2014 Prominent left upper lobe infiltrate consistent with pneumonia.  CXR 05/24/2014 Previously identified LUL infiltrate most likely represented overlying prominent left breast tissue. New onset congestive heart failure with interstitial pulmonary edema.  Dg Chest 2 View  05/27/2014  Although improved compared to the prior exam, there is still abnormal airway thickening and interstitial opacity in a perihilar distribution and in the left lower lobe which may reflect atypical pneumonia or improving bronchopneumonia. Trace bilateral pleural effusions.    Antibiotics:  Vancomycin 7/20 --> 7/21  Zosyn 7/20 --> 7/21  Levaquin 7/21 -->  Code Status: Full  Family Communication: Pt at bedside  Disposition Plan: Recommend SNF, placement in progress, possibly Monday   HPI/Subjective: No events overnight.   Objective: Filed Vitals:   05/27/14 0453 05/27/14 1450 05/27/14 2126 05/28/14 0529  BP: 117/44 115/55 120/49 112/46  Pulse: 62 65 64 65  Temp: 98.2 F (36.8 C) 97.7 F (36.5 C) 97.8 F (36.6 C) 97.5 F (36.4 C)  TempSrc: Oral Oral Oral Oral  Resp: 16 16 16 18   Height:      Weight: 49 kg (108 lb 0.4 oz)     SpO2: 97% 100% 100% 100%    Intake/Output Summary (Last  24 hours) at 05/28/14 1118 Last data filed at 05/27/14 2126  Gross per 24 hour  Intake    600 ml  Output      0 ml  Net    600 ml    Exam:   General:  Pt is alert, follows commands appropriately, not in acute distress  Cardiovascular: Regular rate and rhythm,  no rubs, no gallops  Respiratory: Clear to auscultation bilaterally, no wheezing, diminished breath sounds at bases   Abdomen: Soft, non tender, non distended, bowel sounds present, no guarding  Extremities: No edema, pulses DP and PT  palpable bilaterally   Data Reviewed: Basic Metabolic Panel:  Recent Labs Lab 05/24/14 0421 05/25/14 0445 05/26/14 0057 05/27/14 0050 05/28/14 0418  NA 136* 137 137 136* 137  K 5.1 4.8 5.1 5.0 4.4  CL 103 101 101 100 101  CO2 19 22 23 22 23   GLUCOSE 28* 38* 130* 213* 100*  BUN 55* 46* 48* 50* 44*  CREATININE 1.73* 1.85* 1.82* 1.97* 1.80*  CALCIUM 8.8 8.5 8.6 8.8 8.9   Liver Function Tests:  Recent Labs Lab 05/23/14 1605 05/24/14 0421  AST 30 41*  ALT 84* 74*  ALKPHOS 92 93  BILITOT 0.4 0.4  PROT 6.4 5.9*  ALBUMIN 3.0* 2.8*   CBC:  Recent Labs Lab 05/23/14 1605 05/24/14 0421 05/25/14 0445 05/26/14 0057 05/27/14 0050 05/28/14 0418  WBC 6.0 6.1 5.2 5.3 5.6 6.5  NEUTROABS 5.4  --   --   --   --   --   HGB 11.8* 11.5* 10.0* 9.8* 10.1* 10.5*  HCT 35.9* 33.8* 30.0* 29.5* 30.0* 31.9*  MCV 98.4 97.7 96.8 97.4 98.7 98.5  PLT 190 191 189 193 212 222   CBG:  Recent Labs Lab 05/27/14 0757 05/27/14 1207 05/27/14 1638 05/27/14 2128 05/28/14 0751  GLUCAP 119* 122* 255* 144* 91    Recent Results (from the past 240 hour(s))  CULTURE, BLOOD (ROUTINE X 2)     Status: None   Collection Time    05/23/14  4:06 PM      Result Value Ref Range Status   Specimen Description BLOOD RIGHT HAND   Final   Special Requests BOTTLES DRAWN AEROBIC AND ANAEROBIC 3ML   Final   Culture  Setup Time     Final   Value: 05/23/2014 21:29     Performed at [facility]   Culture     Final   Value:        BLOOD CULTURE RECEIVED NO GROWTH TO DATE CULTURE WILL BE HELD FOR 5 DAYS BEFORE ISSUING A FINAL NEGATIVE REPORT     Performed at [facility]   Report Status PENDING   Incomplete  CULTURE, BLOOD (ROUTINE X 2)     Status: None   Collection Time    05/23/14  4:06 PM      Result Value Ref Range Status   Specimen Description BLOOD BLOOD RIGHT FOREARM   Final   Special Requests BOTTLES DRAWN AEROBIC ONLY 3ML   Final   Culture  Setup Time     Final   Value: 05/23/2014  21:31     Performed at [facility]   Culture     Final   Value:        BLOOD CULTURE RECEIVED NO GROWTH TO DATE CULTURE WILL BE HELD FOR 5 DAYS BEFORE ISSUING A FINAL NEGATIVE REPORT     Performed at [facility]   Report Status PENDING  Incomplete  URINE CULTURE     Status: None   Collection Time    05/24/14 11:45 AM      Result Value Ref Range Status   Specimen Description URINE, CATHETERIZED   Final   Special Requests NONE   Final   Culture  Setup Time     Final   Value: 05/24/2014 17:47     Performed at [place]     Final   Value: NO GROWTH     Performed at [facility]   Culture     Final   Value: NO GROWTH     Performed at [facility]   Report Status 05/25/2014 FINAL   Final  URINE CULTURE     Status: None   Collection Time    05/25/14  3:57 PM      Result Value Ref Range Status   Specimen Description URINE, CATHETERIZED   Final   Special Requests NONE   Final   Culture  Setup Time     Final   Value: 05/25/2014 19:19     Performed at [place]     Final   Value: NO GROWTH     Performed at [facility]   Culture     Final   Value: NO GROWTH     Performed at [facility]   Report Status 05/26/2014 FINAL   Final     Scheduled Meds: . ALPRAZolam  0.25 mg Oral BID  . dabigatran  75 mg Oral BID  . diclofenac sodium  2 g Topical QID  . docusate sodium  100 mg Oral BID  . feeding supplement (GLUCERNA SHAKE)  237 mL Oral BID BM  . folic acid  1 mg Oral Daily  . hydrocortisone   Topical TID  . insulin aspart  0-5 Units Subcutaneous QHS  . insulin aspart  0-9 Units Subcutaneous TID WC  . levofloxacin  250 mg Oral Q48H  . metoprolol  50 mg Oral BID  . multivitamin with minerals  1 tablet Oral Daily  . pantoprazole  40 mg Oral Daily  . thiamine  100 mg Oral Daily   Continuous Infusions:    Faye Ramsay, MD  [facility] Pager [phone]  If 7PM-7AM, please  contact night-coverage [email or web address] Password [email or web address] 05/28/2014, 11:18 AM   LOS: 5 days

## 2014-05-28 NOTE — Progress Notes (Signed)
Chart review.    Per MD noted from today, Pt not likely ready for d/c until Monday.  RN to call CSW if d/c plan changes.  CSW to continue to follow.  Bernita Raisin, [place] [facility] [phone]

## 2014-05-28 NOTE — Progress Notes (Signed)
Occupational Therapy Treatment Patient Details Name: Sophia Oliver MRN: [id number] DOB: [birth year] Today's Date: 05/28/2014    History of present illness Pt was admitted for pna.  She has a h/o pacemaker, A Fib, sinus node dysfunction, breast CA and CKD   OT comments  Pt requires min assist to steady throughout functional transfer into bathroom. She is limited by decreased ROM in R shoulder to comb her hair. Educated on long handle grooming aids. She will benefit from continued OT services.    Follow Up Recommendations  SNF;Supervision/Assistance - 24 hour    Equipment Recommendations  None recommended by OT    Recommendations for Other Services      Precautions / Restrictions Precautions Precautions: Fall Restrictions Weight Bearing Restrictions: No       Mobility Bed Mobility Overal bed mobility: Needs Assistance Bed Mobility: Supine to Sit     Supine to sit: Min guard;HOB elevated     General bed mobility comments: increased time  Transfers Overall transfer level: Needs assistance Equipment used: Rolling walker (2 wheeled) Transfers: Sit to/from Stand Sit to Stand: Min assist         General transfer comment: assist to steady as she stands quickly and is unsteady.    Balance                                   ADL       Grooming: Brushing hair;Oral care;Standing;Minimal assistance               Lower Body Dressing: Min guard;Sitting/lateral leans (min guard leaning forward to don socks.)   Toilet Transfer: Minimal assistance;RW             General ADL Comments: Pt  pleasant and motivated. Pt having to use L UE to help raise R UE to comb hair. Educated her on long handle comb and brush options and coverage. Pt appreciative of information and states she will have someone check at medical supply store for her. She needed min assist to comb back of hair thoroughly. She is unsteady with transferring into bathroom and needs min assist  continuously for safety and balance. She tends to stand quickly also Plan is for SNF. While standing to let nursing bring in pad for the chair for her to sit up, pt stating her back was hurting pretty badly (not rated) so helped pt to EOB until pad arrived. Informed nursing of back pain.      Vision                     Perception     Praxis      Cognition   Behavior During Therapy: [facility] for tasks assessed/performed Overall Cognitive Status: Within Functional Limits for tasks assessed                       Extremity/Trunk Assessment               Exercises     Shoulder Instructions       General Comments      Pertinent Vitals/ Pain       States back hurts but didn't rate pain, informed nursing, reposition.  Home Living  Prior Functioning/Environment              Frequency Min 2X/week     Progress Toward Goals  OT Goals(current goals can now be found in the care plan section)  Progress towards OT goals: Progressing toward goals     Plan Discharge plan remains appropriate    Co-evaluation                 End of Session Equipment Utilized During Treatment: Gait belt;Rolling walker   Activity Tolerance Patient limited by pain (back pain; informed nursing)   Patient Left in chair;with chair alarm set;with call bell/phone within reach   Nurse Communication          Time: 1455-1519 OT Time Calculation (min): 24 min  Charges: OT General Charges $OT Visit: 1 Procedure OT Treatments $Self Care/Home Management : 8-22 mins $Therapeutic Activity: 8-22 mins  Jules Schick [phone] 05/28/2014, 3:28 PM

## 2014-05-29 LAB — CBC
HEMATOCRIT: 31.9 % — AB (ref 36.0–46.0)
Hemoglobin: 10.4 g/dL — ABNORMAL LOW (ref 12.0–15.0)
MCH: 32.3 pg (ref 26.0–34.0)
MCHC: 32.6 g/dL (ref 30.0–36.0)
MCV: 99.1 fL (ref 78.0–100.0)
PLATELETS: 258 10*3/uL (ref 150–400)
RBC: 3.22 MIL/uL — ABNORMAL LOW (ref 3.87–5.11)
RDW: 14.8 % (ref 11.5–15.5)
WBC: 6.5 10*3/uL (ref 4.0–10.5)

## 2014-05-29 LAB — BASIC METABOLIC PANEL
ANION GAP: 11 (ref 5–15)
BUN: 42 mg/dL — AB (ref 6–23)
CALCIUM: 8.9 mg/dL (ref 8.4–10.5)
CO2: 25 mEq/L (ref 19–32)
Chloride: 100 mEq/L (ref 96–112)
Creatinine, Ser: 1.79 mg/dL — ABNORMAL HIGH (ref 0.50–1.10)
GFR calc non Af Amer: 24 mL/min — ABNORMAL LOW (ref >90)
GFR, EST AFRICAN AMERICAN: 28 mL/min — AB (ref >90)
Glucose, Bld: 171 mg/dL — ABNORMAL HIGH (ref 70–99)
Potassium: 4.7 mEq/L (ref 3.7–5.3)
Sodium: 136 mEq/L — ABNORMAL LOW (ref 137–147)

## 2014-05-29 LAB — CULTURE, BLOOD (ROUTINE X 2)
CULTURE: NO GROWTH
Culture: NO GROWTH

## 2014-05-29 LAB — GLUCOSE, CAPILLARY
GLUCOSE-CAPILLARY: 114 mg/dL — AB (ref 70–99)
Glucose-Capillary: 87 mg/dL (ref 70–99)
Glucose-Capillary: 110 mg/dL — ABNORMAL HIGH (ref 70–99)
Glucose-Capillary: 135 mg/dL — ABNORMAL HIGH (ref 70–99)

## 2014-05-29 MED ORDER — DSS 100 MG PO CAPS: 100.0000 mg | ORAL_CAPSULE | Freq: Two times a day (BID) | ORAL | Status: AC

## 2014-05-29 MED ORDER — LEVOFLOXACIN 250 MG PO TABS
250.0000 mg | ORAL_TABLET | ORAL | Status: DC
Start: 2014-05-29 — End: 2014-06-24

## 2014-05-29 MED ORDER — ALPRAZOLAM 0.25 MG PO TABS: ORAL_TABLET | ORAL | Status: DC

## 2014-05-29 NOTE — Discharge Summary (Signed)
Physician Discharge Summary  Sophia Oliver LZJ:[id number] DOB: 1923-11-22 DOA: 05/23/2014  PCP: [name], MD  Admit date: 05/23/2014 Discharge date: 05/30/2014  Recommendations for Outpatient Follow-up:  1. Pt will need to follow up with PCP in 2-3 weeks post discharge 2. Please obtain BMP to evaluate electrolytes and kidney function 3. Please also check CBC to evaluate Hg and Hct levels 4. Please note that pt will be discharged on Levaquin to complete therapy for 3 more doses, please note that Levaquin is given Q48 hours based on GFR 5. Pt will need repeat CXR in 3-4 weeks to ensure resolution of the pulmonary process   Discharge Diagnoses: HCAP  Principal Problem:   HCAP (healthcare-associated pneumonia) Active Problems:   CKD (chronic kidney disease), stage III   UTI (lower urinary tract infection)   Pacemaker   Hyperkalemia   Chronic kidney disease   PAF (paroxysmal atrial fibrillation)   Chronic systolic CHF (congestive heart failure)   General weakness   Type II or unspecified type diabetes mellitus without mention of complication, not stated as uncontrolled   Malnutrition of moderate degree   Volume overload  Discharge Condition: Stable  Diet recommendation: Heart healthy diet discussed in details   Brief narrative:  80 y.o. female presented with main concern of progressive weakness, subjective fevers, chills, cough, exertional shortness of breath. Was recently treated for UTI as well with Macrobid but her symptoms of urinary frequency have not improved. She has been referred for an admission for PNA, UTI.   Principal Problem:  HCAP (healthcare-associated pneumonia)  - initially started on Vanc and Zosyn, repeat CXR with worsening pulmonary edema but no signs of PNA  - pt has remained afebrile and ABX changed to Levaquin due to concomitant UTI  - pt remains hemodynamically stable, maintaining oxygen saturation at target range, WBC stable and WNL for 72 hours  - repeat  CXR with improving PNA but still not clear, will need follow up CXR in an outpatient setting in 3-4 weeks Active Problems:  CKD (chronic kidney disease), stage III  - Cr remains stable at 1.7 - 1.9  UTI (lower urinary tract infection)  - currently on Levaquin  - urine culture with no growth  Pacemaker  - stable  Hyperkalemia  - K remains stable and WNL  - repeat BMP in AM  PAF (paroxysmal atrial fibrillation)  - rate controlled  - continue Pradaxa and Metoprolol  Chronic systolic and diastolic CHF (congestive heart failure), acute flare  - severe systolic CHF per last 2 D ECHO EF 20%, grade II diastolic CHF  - CXR with worsening pulmonary edema 7/21  - gave one dose of Lasix 05/25/2014 and pt has responded well, reports feeling better  - monitor daily weights, I's and O's  - weight trend: 107 lbs --> 106 lbs --> 108 lbs --> 108 lbs --> 105 lbs this AM - since weight is trending down and pt is euvolemic, no need for additional lasix for now  Type II or unspecified type diabetes mellitus without mention of complication, not stated as uncontrolled  - reasonable inpatient control  Malnutrition, severe  - advanced diet as pt tolerating and doing well  Acute on chronic blood loss anemia  - no signs of active bleeding  - Hg remains stable  DVT prophylaxis  - on Pradaxa   Consultants:  None  Procedures/Studies:  CXR 05/23/2014 Prominent left upper lobe infiltrate consistent with pneumonia.  CXR 05/24/2014 Previously identified LUL infiltrate most likely represented overlying  prominent left breast tissue. New onset congestive heart failure with interstitial pulmonary edema.  Dg Chest 2 View 05/27/2014 Although improved compared to the prior exam, there is still abnormal airway thickening and interstitial opacity in a perihilar distribution and in the left lower lobe which may reflect atypical pneumonia or improving bronchopneumonia. Trace bilateral pleural effusions.  Antibiotics:   Vancomycin 7/20 --> 7/21  Zosyn 7/20 --> 7/21  Levaquin 7/21 --> 3 more doses post discharge   Code Status: Full  Family Communication: Pt at bedside  Disposition Plan: SNF Monday    Discharge Exam: Filed Vitals:   05/29/14 0540  BP: 98/51  Pulse: 66  Temp: 97.4 F (36.3 C)  Resp: 16   Filed Vitals:   05/28/14 1524 05/28/14 2021 05/28/14 2056 05/29/14 0540  BP:  103/29 110/46 98/51  Pulse:  65  66  Temp:  97.3 F (36.3 C)  97.4 F (36.3 C)  TempSrc:  Oral  Oral  Resp:  16  16  Height:      Weight: 50 kg (110 lb 3.7 oz)     SpO2:  99%  100%    General: Pt is alert, follows commands appropriately, not in acute distress Cardiovascular: Irregular rate and rhythm, no rubs, no gallops Respiratory: Clear to auscultation bilaterally, no wheezing, no crackles, no rhonchi Abdominal: Soft, non tender, non distended, bowel sounds +, no guarding Discharge Instructions     Medication List    STOP taking these medications       nitrofurantoin (macrocrystal-monohydrate) 100 MG capsule  Commonly known as:  MACROBID      TAKE these medications       acetaminophen 325 MG tablet  Commonly known as:  TYLENOL  Take 325 mg by mouth every 6 (six) hours as needed for pain.     ALPRAZolam 0.25 MG tablet  Commonly known as:  XANAX  Take one tablet by mouth twice daily for anxiety     B-complex with vitamin C tablet  Take 1 tablet by mouth daily.     dabigatran 75 MG Caps capsule  Commonly known as:  PRADAXA  Take 75 mg by mouth 2 (two) times daily.     diclofenac sodium 1 % Gel  Commonly known as:  VOLTAREN  Apply 2 g topically 4 (four) times daily.     DSS 100 MG Caps  Take 100 mg by mouth 2 (two) times daily.     glipiZIDE 5 MG tablet  Commonly known as:  GLUCOTROL  Take 0.5 tablets (2.5 mg total) by mouth daily before breakfast.     levofloxacin 250 MG tablet  Commonly known as:  LEVAQUIN  Take 1 tablet (250 mg total) by mouth every other day. 3 more doses       metoprolol 50 MG tablet  Commonly known as:  LOPRESSOR  Take 1 tablet (50 mg total) by mouth 2 (two) times daily.     pantoprazole 40 MG tablet  Commonly known as:  PROTONIX  Take 40 mg by mouth daily.     spironolactone 25 MG tablet  Commonly known as:  ALDACTONE  Take 25 mg by mouth daily.           Follow-up Information   Schedule an appointment as soon as possible for a visit with [name], MD.   Specialty:  Internal Medicine   Contact information:   [address] [place] [address] [phone]        The results  of significant diagnostics from this hospitalization (including imaging, microbiology, ancillary and laboratory) are listed below for reference.     Microbiology: Recent Results (from the past 240 hour(s))  CULTURE, BLOOD (ROUTINE X 2)     Status: None   Collection Time    05/23/14  4:06 PM      Result Value Ref Range Status   Specimen Description BLOOD RIGHT HAND   Final   Special Requests BOTTLES DRAWN AEROBIC AND ANAEROBIC 3ML   Final   Culture  Setup Time     Final   Value: 05/23/2014 21:29     Performed at [facility]   Culture     Final   Value:        BLOOD CULTURE RECEIVED NO GROWTH TO DATE CULTURE WILL BE HELD FOR 5 DAYS BEFORE ISSUING A FINAL NEGATIVE REPORT     Performed at [facility]   Report Status PENDING   Incomplete  CULTURE, BLOOD (ROUTINE X 2)     Status: None   Collection Time    05/23/14  4:06 PM      Result Value Ref Range Status   Specimen Description BLOOD BLOOD RIGHT FOREARM   Final   Special Requests BOTTLES DRAWN AEROBIC ONLY 3ML   Final   Culture  Setup Time     Final   Value: 05/23/2014 21:31     Performed at [facility]   Culture     Final   Value:        BLOOD CULTURE RECEIVED NO GROWTH TO DATE CULTURE WILL BE HELD FOR 5 DAYS BEFORE ISSUING A FINAL NEGATIVE REPORT     Performed at [facility]   Report Status PENDING   Incomplete  URINE CULTURE     Status:  None   Collection Time    05/24/14 11:45 AM      Result Value Ref Range Status   Specimen Description URINE, CATHETERIZED   Final   Special Requests NONE   Final   Culture  Setup Time     Final   Value: 05/24/2014 17:47     Performed at [place]     Final   Value: NO GROWTH     Performed at [facility]   Culture     Final   Value: NO GROWTH     Performed at [facility]   Report Status 05/25/2014 FINAL   Final  URINE CULTURE     Status: None   Collection Time    05/25/14  3:57 PM      Result Value Ref Range Status   Specimen Description URINE, CATHETERIZED   Final   Special Requests NONE   Final   Culture  Setup Time     Final   Value: 05/25/2014 19:19     Performed at [place]     Final   Value: NO GROWTH     Performed at [facility]   Culture     Final   Value: NO GROWTH     Performed at [facility]   Report Status 05/26/2014 FINAL   Final     Labs: Basic Metabolic Panel:  Recent Labs Lab 05/25/14 0445 05/26/14 0057 05/27/14 0050 05/28/14 0418 05/29/14 0010  NA 137 137 136* 137 136*  K 4.8 5.1 5.0 4.4 4.7  CL 101 101 100 101 100  CO2 22 23  22 23 25   GLUCOSE 38* 130* 213* 100* 171*  BUN 46* 48* 50* 44* 42*  CREATININE 1.85* 1.82* 1.97* 1.80* 1.79*  CALCIUM 8.5 8.6 8.8 8.9 8.9   Liver Function Tests:  Recent Labs Lab 05/23/14 1605 05/24/14 0421  AST 30 41*  ALT 84* 74*  ALKPHOS 92 93  BILITOT 0.4 0.4  PROT 6.4 5.9*  ALBUMIN 3.0* 2.8*   No results found for this basename: LIPASE, AMYLASE,  in the last 168 hours No results found for this basename: AMMONIA,  in the last 168 hours CBC:  Recent Labs Lab 05/23/14 1605  05/25/14 0445 05/26/14 0057 05/27/14 0050 05/28/14 0418 05/29/14 0010  WBC 6.0  < > 5.2 5.3 5.6 6.5 6.5  NEUTROABS 5.4  --   --   --   --   --   --   HGB 11.8*  < > 10.0* 9.8* 10.1* 10.5* 10.4*  HCT 35.9*  < > 30.0* 29.5* 30.0* 31.9* 31.9*   MCV 98.4  < > 96.8 97.4 98.7 98.5 99.1  PLT 190  < > 189 193 212 222 258  < > = values in this interval not displayed. Cardiac Enzymes: No results found for this basename: CKTOTAL, CKMB, CKMBINDEX, TROPONINI,  in the last 168 hours BNP: BNP (last 3 results)  Recent Labs  12/06/13 1129  PROBNP 200.0*   CBG:  Recent Labs Lab 05/27/14 2128 05/28/14 0751 05/28/14 1216 05/28/14 1702 05/28/14 2144  GLUCAP 144* 91 134* 142* 176*     SIGNED: Time coordinating discharge: Over 30 minutes  Faye Ramsay, MD  [facility] 05/29/2014, 7:16 AM Pager [phone]  If 7PM-7AM, please contact night-coverage [email or web address] Password [email or web address]

## 2014-05-29 NOTE — Discharge Instructions (Signed)

## 2014-05-30 DIAGNOSIS — N184 Chronic kidney disease, stage 4 (severe): Secondary | ICD-10-CM

## 2014-05-30 DIAGNOSIS — E440 Moderate protein-calorie malnutrition: Secondary | ICD-10-CM

## 2014-05-30 LAB — GLUCOSE, CAPILLARY
Glucose-Capillary: 90 mg/dL (ref 70–99)
Glucose-Capillary: 130 mg/dL — ABNORMAL HIGH (ref 70–99)

## 2014-05-30 NOTE — Progress Notes (Signed)
Pt for discharge to [facility].  CSW facilitated pt discharge needs including contacting facility, faxing pt discharge information via [facility], discussing with pt at bedside, notifying pt husband via telephone, and arranging non-emergency transport via [facility] to [facility]. (Service Request ID#: [id number]).  Pt coping appropriately with transition to [facility]. Pt is pleased to go to a different facility and feels that she will have a better experience at [facility] as pt family got to visit facility on Friday and were impressed with the facility. Pt appreciative of CSW assistance.   No further social work needs identified at this time.  CSW signing off.   Alison Murray, MSW, [place] [employer] [phone]

## 2014-05-30 NOTE — Progress Notes (Signed)
Patient scheduled to D/C to SNF today.  Pradaxa is renally-adjusted but SCr 24.82 at discharge, so currently felt to be safe, but would recommend ongoing surveillance of her creatinine clearance with consideration for using an alternative anticoagulant if her creatinine clearance deteriorates to <71ml/min.  The patient is medically stable for discharge. Vital signs are stable.   Physical exam: Gen:  NAD, frail Cardiovascular:  RRR, No M/R/G Respiratory: Lungs CTAB, diminished at the bases. Gastrointestinal: Abdomen soft, NT/ND with normal active bowel sounds. Extremities: No C/E/C   Sophia Oliver 05/30/2014 9:36 AM

## 2014-05-31 ENCOUNTER — Non-Acute Institutional Stay (SKILLED_NURSING_FACILITY): Payer: Medicare Other | Admitting: Internal Medicine

## 2014-05-31 DIAGNOSIS — J189 Pneumonia, unspecified organism: Secondary | ICD-10-CM

## 2014-05-31 DIAGNOSIS — N183 Chronic kidney disease, stage 3 unspecified: Secondary | ICD-10-CM

## 2014-05-31 DIAGNOSIS — N390 Urinary tract infection, site not specified: Secondary | ICD-10-CM

## 2014-05-31 DIAGNOSIS — I509 Heart failure, unspecified: Secondary | ICD-10-CM

## 2014-05-31 DIAGNOSIS — I5022 Chronic systolic (congestive) heart failure: Secondary | ICD-10-CM

## 2014-05-31 NOTE — Progress Notes (Signed)
HISTORY & PHYSICAL  DATE: 05/31/2014   FACILITY: [facility]  LEVEL OF CARE: SNF (31)  ALLERGIES:  Allergies  Allergen Reactions  . Codeine Nausea And Vomiting  . Hydrocodone-Acetaminophen Itching  . Other Nausea And Vomiting    Narcotics  dizziness    CHIEF COMPLAINT:  Manage pneumonia, UTI and CHF  HISTORY OF PRESENT ILLNESS: 80 year old Caucasian female was hospitalized secondary to UTI and pneumonia. After hospitalization she is admitted to this facility for short-term rehabilitation.  PNEUMONIA: The pneumonia remains stable.  The patient denies ongoing chest pain, cough, shortness of breath, fever, chills or night sweats. No complications reported from the current antibiotic being used. Patient is on Levaquin.  UTI: The UTI remains stable.  The patient denies ongoing suprapubic pain, flank pain, dysuria, urinary frequency, urinary hesitancy or hematuria.  No complications reported from the current antibiotic being used.  CHF:The patient does not relate significant weight changes, denies sob, DOE, orthopnea, PNDs, pedal edema, palpitations or chest pain.  CHF remains stable.  No complications form the medications being used. EF 20%.  PAST MEDICAL HISTORY :  Past Medical History  Diagnosis Date  . Syncope   . Sinus node dysfunction     a. s/p pacemaker.  . Wide-complex tachycardia   . Cholelithiasis   . Glucose intolerance (impaired glucose tolerance)   . Anxiety   . PONV (postoperative nausea and vomiting)   . Chronic kidney disease     a. Baseline Cr reported 1.8-2.0.  . Injury of right upper arm     after venipuncture in 3/13 - pt describes pain afterwards- no followup done seen by PCP- no further treatment   . PAF (paroxysmal atrial fibrillation)   . Left bundle branch block   . Tachycardia-bradycardia syndrome   . CHF (congestive heart failure)     a. EF 30-35% by echo 2015.  [name] Pacemaker     Guidant, implanted 2006  . Type II  diabetes mellitus     "recently dx'd" (03/09/2014)  . Breast cancer 2003    "radiation on right; mastectomy on the left"  . Hypertension   . GERD (gastroesophageal reflux disease)   . DJD (degenerative joint disease)   . Arthritis     "real bad in my lower back" (03/09/2014)  . Chronic lower back pain   . Orthostatic hypotension     a. Diuretics d/c'd 03/2014.  [name] PAT (paroxysmal atrial tachycardia)     PAST SURGICAL HISTORY: Past Surgical History  Procedure Laterality Date  . Replacement total knee bilateral Bilateral   . Total shoulder replacement    . Tonsillectomy    . Orif distal femur fracture Right 12/2007  . Tonsillectomy    . Mastectomy Left 2003  . Total hip arthroplasty  06/16/2012    Procedure: TOTAL HIP ARTHROPLASTY;  Surgeon: Mauri Pole, MD;  Location: [facility];  Service: Orthopedics;  Laterality: Left;  . Hip closed reduction  06/30/2012    Procedure: CLOSED MANIPULATION HIP;  Surgeon: Mauri Pole, MD;  Location: [facility];  Service: Orthopedics;  Laterality: Left;  . Abdominal hysterectomy    . Pacemaker insertion      Guidant Insignia I  . Cataract extraction w/ intraocular lens  implant, bilateral Bilateral 2000's  . Refractive surgery Bilateral 01/2014  . Inguinal hernia repair Left     "had emergency OR for it a few years ago" (03/09/2014)  . Joint replacement    .  Breast biopsy Bilateral     SOCIAL HISTORY:  reports that she has quit smoking. Her smoking use included Cigarettes. She has a 3 pack-year smoking history. She has never used smokeless tobacco. She reports that she drinks alcohol. She reports that she does not use illicit drugs.  FAMILY HISTORY:  Family History  Problem Relation Age of Onset  . Hypertension      CURRENT MEDICATIONS: Reviewed per MAR/see medication list  REVIEW OF SYSTEMS:  See HPI otherwise 14 point ROS is negative.  PHYSICAL EXAMINATION  VS:  See VS section  GENERAL: no acute distress, thin body habitus EYES: conjunctivae  normal, sclerae normal, normal eye lids MOUTH/THROAT: lips without lesions,no lesions in the mouth,tongue is without lesions,uvula elevates in midline NECK: supple, trachea midline, no neck masses, no thyroid tenderness, no thyromegaly LYMPHATICS: no LAN in the neck, no supraclavicular LAN RESPIRATORY: breathing is even & unlabored, BS CTAB CARDIAC: Heart rate is irregularly irregular, no murmur,no extra heart sounds, no edema GI:  ABDOMEN: abdomen soft, normal BS, no masses, no tenderness  LIVER/SPLEEN: no hepatomegaly, no splenomegaly MUSCULOSKELETAL: HEAD: normal to inspection  EXTREMITIES: LEFT UPPER EXTREMITY: Moderate range of motion, decreased strength & tone RIGHT UPPER EXTREMITY:  Moderate range of motion, decreased strength & tone LEFT LOWER EXTREMITY:  Moderate range of motion, decreased strength & tone RIGHT LOWER EXTREMITY:  Moderate range of motion, decreased strength & tone PSYCHIATRIC: the patient is alert & oriented to person, affect & behavior appropriate  LABS/RADIOLOGY:  Labs reviewed: Basic Metabolic Panel:  Recent Labs  03/16/14 0400  05/27/14 0050 05/28/14 0418 05/29/14 0010  NA 140  < > 136* 137 136*  K 4.9  < > 5.0 4.4 4.7  CL 109  < > 100 101 100  CO2 18*  < > 22 23 25   GLUCOSE 137*  < > 213* 100* 171*  BUN 55*  < > 50* 44* 42*  CREATININE 1.73*  < > 1.97* 1.80* 1.79*  CALCIUM 8.5  < > 8.8 8.9 8.9  MG 1.8  --   --   --   --   < > = values in this interval not displayed. Liver Function Tests:  Recent Labs  03/15/14 1228 05/23/14 1605 05/24/14 0421  AST 20 30 41*  ALT 30 84* 74*  ALKPHOS 103 92 93  BILITOT 0.7 0.4 0.4  PROT 5.4* 6.4 5.9*  ALBUMIN 2.8* 3.0* 2.8*   CBC:  Recent Labs  03/12/14 0600 03/15/14 1228  05/23/14 1605  05/27/14 0050 05/28/14 0418 05/29/14 0010  WBC 10.0 20.8*  < > 6.0  < > 5.6 6.5 6.5  NEUTROABS 8.5* 19.9*  --  5.4  --   --   --   --   HGB 10.7* 11.0*  < > 11.8*  < > 10.1* 10.5* 10.4*  HCT 33.5* 33.3*  <  > 35.9*  < > 30.0* 31.9* 31.9*  MCV 98.2 94.9  < > 98.4  < > 98.7 98.5 99.1  PLT 237 204  < > 190  < > 212 222 258  < > = values in this interval not displayed. Cardiac Enzymes:  Recent Labs  03/09/14 1414 03/15/14 1228  TROPONINI <0.30 <0.30   CBG:  Recent Labs  05/29/14 2135 05/30/14 0813 05/30/14 1130  GLUCAP 135* 90 130*    Transthoracic Echocardiography  Patient:    Sophia Oliver, Sophia Oliver MR #:       [id number] Study Date: 03/17/2014 Gender:  F Age:        80 Height:     154.9cm Weight:     51.3kg BSA:        1.69m^2 Pt. Status: Room:       [place], RDCS  ADMITTING    Lala Lund  Dwyane Luo, Mickle Mallory  ATTENDING    Rowe Clack  PERFORMING   Chmg, Inpatient cc:  ------------------------------------------------------------ LV EF: 15% -   20%  ------------------------------------------------------------ Indications:      CHF - 428.0.  ------------------------------------------------------------ History:   PMH:   Syncope.  Atrial fibrillation.  Congestive heart failure.  Risk factors:  CKD stage III. UTI. Tachy-brady syndrome. Weakness. Hyperkalemia. Acute heart failure. Former tobacco use. Diabetes mellitus.  ------------------------------------------------------------ Study Conclusions  - Left ventricle: The cavity size was normal. Wall thickness   was increased in a pattern of mild LVH. Systolic function   was severely reduced. The estimated ejection fraction was   in the range of 15% to 20%. Dyskinesis of the   basal-midanteroseptal myocardium. Features are consistent   with a pseudonormal left ventricular filling pattern, with   concomitant abnormal relaxation and increased filling   pressure (grade 2 diastolic dysfunction). - Ventricular septum: The contour showed diastolic   flattening. - Mitral valve: Moderately calcified annulus. Moderate to   severe regurgitation. - Left  atrium: The atrium was severely dilated. - Right ventricle: The cavity size was mildly dilated. - Right atrium: The atrium was moderately to severely   dilated. - Tricuspid valve: Severe regurgitation. - Pulmonary arteries: Systolic pressure was moderately to   severely increased. PA peak pressure: 54mm Hg (S).  ------------------------------------------------------------ Labs, prior tests, procedures, and surgery: Transthoracic echocardiography (December 06, 2013).     EF was 35%.  Permanent pacemaker system implantation. Transthoracic echocardiography.  M-mode, complete 2D, spectral Doppler, and color Doppler.  Height:  Height: 154.9cm. Height: 61in.  Weight:  Weight: 51.3kg. Weight: 112.8lb.  Body mass index:  BMI: 21.4kg/m^2.  Body surface area:    BSA: 1.70m^2.  Blood pressure:     114/70.  Patient status:  Inpatient.  Location:  Bedside.  ------------------------------------------------------------  ------------------------------------------------------------ Left ventricle:  The cavity size was normal. Wall thickness was increased in a pattern of mild LVH. Systolic function was severely reduced. The estimated ejection fraction was in the range of 15% to 20%.  Regional wall motion abnormalities:   Dyskinesis of the basal-midanteroseptal myocardium. Early diastolic septal annular tissue Doppler velocities Ea were abnormal. Features are consistent with a pseudonormal left ventricular filling pattern, with concomitant abnormal relaxation and increased filling pressure (grade 2 diastolic dysfunction).  ------------------------------------------------------------ Aortic valve:   Structurally normal valve.   Cusp separation was normal.  Doppler:  Transvalvular velocity was within the normal range. There was no stenosis.  No regurgitation.  ------------------------------------------------------------ Aorta:  Aortic root: The aortic root was normal in size. Ascending aorta:  The ascending aorta was normal in size.  ------------------------------------------------------------ Mitral valve:   Moderately calcified annulus.  Doppler: There was no evidence for stenosis.    Moderate to severe regurgitation.    Peak gradient: 80mm Hg (D).  ------------------------------------------------------------ Left atrium:  The atrium was severely dilated.  ------------------------------------------------------------ Right ventricle:  The cavity size was mildly dilated. Pacer wire or catheter noted in right ventricle. Systolic function was normal.  ------------------------------------------------------------ Ventricular septum:   The contour showed diastolic flattening.  ------------------------------------------------------------ Pulmonic valve:  Structurally normal valve.   Cusp separation was normal.  Doppler:  Transvalvular velocity was within the normal range.  Trivial regurgitation.  ------------------------------------------------------------ Tricuspid valve:   Structurally normal valve.   Leaflet separation was normal.  Doppler:  Transvalvular velocity was within the normal range.  Severe regurgitation.  ------------------------------------------------------------ Pulmonary artery:   Systolic pressure was moderately to severely increased.  ------------------------------------------------------------ Right atrium:  The atrium was moderately to severely dilated. Pacer wire or catheter noted in right atrium.  ------------------------------------------------------------ Pericardium:  There was no pericardial effusion.  ------------------------------------------------------------ Systemic veins: Inferior vena cava: The vessel was normal in size; the respirophasic diameter changes were in the normal range (= 50%); findings are consistent with normal central venous pressure.  ------------------------------------------------------------  2D measurements         Normal  Doppler               Normal Left ventricle                 measurements LVID ED,   41.1 mm     43-52   Main pulmonary chord,                         artery PLAX                           Pressure, S    65 mm  =30 LVID ES,   37.6 mm     23-38                     Hg chord,                         Left ventricle PLAX                           Ea, lat      9.47 cm/ ------- FS, chord,    9 %      >29     ann, tiss         s PLAX                           DP LVPW, ED   12.8 mm     ------  E/Ea, lat   11.51     ------- IVS/LVPW   1.01        <1.3    ann, tiss ratio, ED                      DP Ventricular septum             Ea, med      6.02 cm/ ------- IVS, ED    12.9 mm     ------  ann, tiss         s Aorta                          DP Root diam,   26 mm     ------  E/Ea, med   18.11     ------- ED                             ann, tiss Left atrium  DP AP dim       44 mm     ------  Mitral valve AP dim     2.97 cm/m^2 <2.2    Peak E vel    109 cm/ ------- index                                            s Vol, S       80 ml     ------  Peak A vel   87.4 cm/ ------- Vol index, 54.1 ml/m^2 ------                    s S                              Deceleratio   134 ms  150-230                                n time                                Peak            5 mm  -------                                gradient, D       Hg                                Peak E/A      1.2     -------                                ratio                                Regurg       38.5 cm/ -------                                alias vel,        s                                PISA                                Max regurg    425 cm/ -------                                vel               s                                Regurg VTI  155 cm  -------                                ERO, PISA    0.14 cm^ -------                                                  2                                 Regurg vol,    22 ml  -------                                PISA                                Tricuspid valve                                Regurg peak   355 cm/ -------                                vel               s                                Peak RV-RA     50 mm  -------                                gradient, S       Hg                                Systemic veins                                Estimated      15 mm  -------                                CVP               Hg                                Right ventricle                                Pressure, S    65 mm  <30  Hg                                Sa vel, lat  7.14 cm/ -------                                ann, tiss         s                                DP   CHEST  2 VIEW   COMPARISON:  05/24/2014   FINDINGS: Mildly enlarged cardiopericardial silhouette with improved interstitial opacity compared to prior, although with airway thickening and interstitial opacity persisting bilaterally. This is somewhat confluent in the left lower lobe. Vague density in the left mid chest is extra pulmonary and related to the tissues at the chest wall and a left breast implant.   Dual lead pacer, lead positioning unchanged.   There is mild blunting of both posterior costophrenic angles. Faint airspace opacity at the right lateral costophrenic angle.   Right proximal humeral prosthesis. Severe arthropathy of the left glenohumeral joint with resorption of much of the humeral head.   IMPRESSION: 1. Although improved compared to the prior exam, there is still abnormal airway thickening and interstitial opacity in a perihilar distribution and in the left lower lobe which may reflect atypical pneumonia or improving bronchopneumonia. 2. Trace bilateral pleural effusions.   ASSESSMENT/PLAN:  Pneumonia-continue Levaquin UTI-continue  Levaquin CHF-compensated Chronic kidney disease stage III-recheck Anemia of chronic kidney disease-check hemoglobin Atrial fibrillation-rate controlled Diabetes mellitus with renal complications-continue glipizide Elevated liver functions-recheck Check CBC and CMP  I have reviewed patient's medical records received at admission/from hospitalization.  CPT CODE: 15945  Sophia Oliver, [place] [phone]

## 2014-06-20 ENCOUNTER — Encounter: Payer: Self-pay | Admitting: Adult Health

## 2014-06-20 ENCOUNTER — Non-Acute Institutional Stay (SKILLED_NURSING_FACILITY): Payer: Medicare Other | Admitting: Adult Health

## 2014-06-20 DIAGNOSIS — I482 Chronic atrial fibrillation, unspecified: Secondary | ICD-10-CM

## 2014-06-20 DIAGNOSIS — F411 Generalized anxiety disorder: Secondary | ICD-10-CM

## 2014-06-20 DIAGNOSIS — I4891 Unspecified atrial fibrillation: Secondary | ICD-10-CM

## 2014-06-20 DIAGNOSIS — N183 Chronic kidney disease, stage 3 unspecified: Secondary | ICD-10-CM

## 2014-06-20 DIAGNOSIS — N390 Urinary tract infection, site not specified: Secondary | ICD-10-CM

## 2014-06-20 DIAGNOSIS — K219 Gastro-esophageal reflux disease without esophagitis: Secondary | ICD-10-CM

## 2014-06-20 DIAGNOSIS — I5022 Chronic systolic (congestive) heart failure: Secondary | ICD-10-CM

## 2014-06-20 DIAGNOSIS — F419 Anxiety disorder, unspecified: Secondary | ICD-10-CM | POA: Insufficient documentation

## 2014-06-20 DIAGNOSIS — E119 Type 2 diabetes mellitus without complications: Secondary | ICD-10-CM

## 2014-06-20 DIAGNOSIS — I509 Heart failure, unspecified: Secondary | ICD-10-CM

## 2014-06-20 DIAGNOSIS — E440 Moderate protein-calorie malnutrition: Secondary | ICD-10-CM

## 2014-06-21 NOTE — Progress Notes (Signed)
Patient ID: Sophia Oliver, female   DOB: 1924-06-04, 80 y.o.   MRN: [id number]              PROGRESS NOTE  DATE: 06/20/2014   FACILITY: [facility]  LEVEL OF CARE: SNF (31)  Acute Visit  CHIEF COMPLAINT:  Discharge Notes  HISTORY OF PRESENT ILLNESS: This is an 80 year old female who is for discharge home with Home health PT, OT, Nursing and Home health Aide. She has been admitted to [facility] on 05/30/14 from [facility] with Healthcare-associated pneumonia - now resolved. Patient was admitted to this facility for short-term rehabilitation after the patient's recent hospitalization.  Patient has completed SNF rehabilitation and therapy has cleared the patient for discharge.  Reassessment of ongoing problem(s):  DM:pt's DM remains stable.  Pt denies polyuria, polydipsia, polyphagia, changes in vision or hypoglycemic episodes.  No complications noted from the medication presently being used.   7/15 hemoglobin A1c is: 6.2  ATRIAL FIBRILLATION: the patients atrial fibrillation remains stable.  The patient denies DOE, tachycardia, orthopnea, transient neurological sx, pedal edema, palpitations, & PNDs.  No complications noted from the medications currently being used.  CHF:The patient does not relate significant weight changes, denies sob, DOE, orthopnea, PNDs, pedal edema, palpitations or chest pain.  CHF remains stable.  No complications form the medications being used.   PAST MEDICAL HISTORY : Reviewed.  No changes/see problem list  CURRENT MEDICATIONS: Reviewed per MAR/see medication list  REVIEW OF SYSTEMS:  GENERAL: no change in appetite, no fatigue, no weight changes, no fever, chills or weakness RESPIRATORY: no cough, SOB, DOE, wheezing, hemoptysis CARDIAC: no chest pain, edema or palpitations GI: no abdominal pain, diarrhea, constipation, heart burn, nausea or vomiting GU: has urinary frequency  PHYSICAL EXAMINATION  GENERAL: no acute distress,  normal body habitus EYES: conjunctivae normal, sclerae normal, normal eye lids NECK: supple, trachea midline, no neck masses, no thyroid tenderness, no thyromegaly LYMPHATICS: no LAN in the neck, no supraclavicular LAN RESPIRATORY: breathing is even & unlabored, BS CTAB CARDIAC: irregularly irregular, no murmur,no extra heart sounds, no edema, has pacemaker GI: abdomen soft, normal BS, no masses, no tenderness, no hepatomegaly, no splenomegaly EXTREMITIES:  Able to move all 4 extremities PSYCHIATRIC: the patient is alert & oriented to person, affect & behavior appropriate  LABS/RADIOLOGY: Labs reviewed: Basic Metabolic Panel:  Recent Labs  03/16/14 0400  05/27/14 0050 05/28/14 0418 05/29/14 0010  NA 140  < > 136* 137 136*  K 4.9  < > 5.0 4.4 4.7  CL 109  < > 100 101 100  CO2 18*  < > 22 23 25   GLUCOSE 137*  < > 213* 100* 171*  BUN 55*  < > 50* 44* 42*  CREATININE 1.73*  < > 1.97* 1.80* 1.79*  CALCIUM 8.5  < > 8.8 8.9 8.9  MG 1.8  --   --   --   --   < > = values in this interval not displayed. Liver Function Tests:  Recent Labs  03/15/14 1228 05/23/14 1605 05/24/14 0421  AST 20 30 41*  ALT 30 84* 74*  ALKPHOS 103 92 93  BILITOT 0.7 0.4 0.4  PROT 5.4* 6.4 5.9*  ALBUMIN 2.8* 3.0* 2.8*   CBC:  Recent Labs  03/12/14 0600 03/15/14 1228  05/23/14 1605  05/27/14 0050 05/28/14 0418 05/29/14 0010  WBC 10.0 20.8*  < > 6.0  < > 5.6 6.5 6.5  NEUTROABS 8.5* 19.9*  --  5.4  --   --   --   --   HGB 10.7* 11.0*  < > 11.8*  < > 10.1* 10.5* 10.4*  HCT 33.5* 33.3*  < > 35.9*  < > 30.0* 31.9* 31.9*  MCV 98.2 94.9  < > 98.4  < > 98.7 98.5 99.1  PLT 237 204  < > 190  < > 212 222 258  < > = values in this interval not displayed.  Cardiac Enzymes:  Recent Labs  03/09/14 1414 03/15/14 1228  TROPONINI <0.30 <0.30   CBG:  Recent Labs  05/29/14 2135 05/30/14 0813 05/30/14 1130  GLUCAP 135* 90 130*    EXAM: CHEST  2 VIEW   COMPARISON:  05/24/2014    FINDINGS: Mildly enlarged cardiopericardial silhouette with improved interstitial opacity compared to prior, although with airway thickening and interstitial opacity persisting bilaterally. This is somewhat confluent in the left lower lobe. Vague density in the left mid chest is extra pulmonary and related to the tissues at the chest wall and a left breast implant.   Dual lead pacer, lead positioning unchanged.   There is mild blunting of both posterior costophrenic angles. Faint airspace opacity at the right lateral costophrenic angle.   Right proximal humeral prosthesis. Severe arthropathy of the left glenohumeral joint with resorption of much of the humeral head.   IMPRESSION: 1. Although improved compared to the prior exam, there is still abnormal airway thickening and interstitial opacity in a perihilar distribution and in the left lower lobe which may reflect atypical pneumonia or improving bronchopneumonia. 2. Trace bilateral pleural effusions.     ASSESSMENT/PLAN:  Chronic systolic heart failure - stable; continue Aldactone Diabetes mellitus, type II - well controlled; continue Glucotrol Paroxysmal atrial fibrillation - rate controlled; continuePradaxa and Lopressor Chronic kidney disease, stage III - stable; 06/16/14 creatinine 1.7 GERD - continue Protonix Anxiety - stable; continue Xanax Malnutrition of moderate degree - continue supplementation UTI - recently finished -up antibiotic but still complaining of urinary frequency ; check urinalysis with culture and sensitivity    I have filled out patient's discharge paperwork and written prescriptions.  Patient will receive home health PT, OT, Nursing and Home health Aide.   Total discharge time: Greater than 30 minutes  Discharge time involved coordination of the discharge process with social worker, nursing staff and therapy department. Medical justification for home health services verified.  CPT CODE:  35573  Seth Bake - NP [facility] [phone]

## 2014-06-24 ENCOUNTER — Encounter (HOSPITAL_COMMUNITY): Payer: Self-pay | Admitting: Emergency Medicine

## 2014-06-24 ENCOUNTER — Inpatient Hospital Stay (HOSPITAL_COMMUNITY)
Admission: EM | Admit: 2014-06-24 | Discharge: 2014-06-30 | DRG: 871 | Disposition: A | Discharge status: Skilled Nursing Facility | Payer: Medicare Other | Attending: Internal Medicine | Admitting: Internal Medicine

## 2014-06-24 ENCOUNTER — Emergency Department (HOSPITAL_COMMUNITY): Payer: Medicare Other

## 2014-06-24 DIAGNOSIS — J189 Pneumonia, unspecified organism: Secondary | ICD-10-CM | POA: Diagnosis present

## 2014-06-24 DIAGNOSIS — M899 Disorder of bone, unspecified: Secondary | ICD-10-CM

## 2014-06-24 DIAGNOSIS — I4891 Unspecified atrial fibrillation: Secondary | ICD-10-CM | POA: Diagnosis present

## 2014-06-24 DIAGNOSIS — I5022 Chronic systolic (congestive) heart failure: Secondary | ICD-10-CM | POA: Diagnosis present

## 2014-06-24 DIAGNOSIS — E875 Hyperkalemia: Secondary | ICD-10-CM

## 2014-06-24 DIAGNOSIS — E119 Type 2 diabetes mellitus without complications: Secondary | ICD-10-CM | POA: Diagnosis present

## 2014-06-24 DIAGNOSIS — N390 Urinary tract infection, site not specified: Secondary | ICD-10-CM | POA: Diagnosis present

## 2014-06-24 DIAGNOSIS — I5021 Acute systolic (congestive) heart failure: Secondary | ICD-10-CM

## 2014-06-24 DIAGNOSIS — Z87891 Personal history of nicotine dependence: Secondary | ICD-10-CM

## 2014-06-24 DIAGNOSIS — Z7901 Long term (current) use of anticoagulants: Secondary | ICD-10-CM | POA: Diagnosis not present

## 2014-06-24 DIAGNOSIS — N184 Chronic kidney disease, stage 4 (severe): Secondary | ICD-10-CM | POA: Diagnosis present

## 2014-06-24 DIAGNOSIS — F419 Anxiety disorder, unspecified: Secondary | ICD-10-CM

## 2014-06-24 DIAGNOSIS — Z950 Presence of cardiac pacemaker: Secondary | ICD-10-CM

## 2014-06-24 DIAGNOSIS — K219 Gastro-esophageal reflux disease without esophagitis: Secondary | ICD-10-CM | POA: Diagnosis present

## 2014-06-24 DIAGNOSIS — I959 Hypotension, unspecified: Secondary | ICD-10-CM | POA: Diagnosis not present

## 2014-06-24 DIAGNOSIS — G8929 Other chronic pain: Secondary | ICD-10-CM | POA: Diagnosis present

## 2014-06-24 DIAGNOSIS — F411 Generalized anxiety disorder: Secondary | ICD-10-CM | POA: Diagnosis present

## 2014-06-24 DIAGNOSIS — R6520 Severe sepsis without septic shock: Secondary | ICD-10-CM

## 2014-06-24 DIAGNOSIS — I509 Heart failure, unspecified: Secondary | ICD-10-CM | POA: Diagnosis present

## 2014-06-24 DIAGNOSIS — R531 Weakness: Secondary | ICD-10-CM

## 2014-06-24 DIAGNOSIS — Z96649 Presence of unspecified artificial hip joint: Secondary | ICD-10-CM

## 2014-06-24 DIAGNOSIS — N183 Chronic kidney disease, stage 3 unspecified: Secondary | ICD-10-CM

## 2014-06-24 DIAGNOSIS — Z853 Personal history of malignant neoplasm of breast: Secondary | ICD-10-CM | POA: Diagnosis not present

## 2014-06-24 DIAGNOSIS — E440 Moderate protein-calorie malnutrition: Secondary | ICD-10-CM

## 2014-06-24 DIAGNOSIS — R5381 Other malaise: Secondary | ICD-10-CM

## 2014-06-24 DIAGNOSIS — A419 Sepsis, unspecified organism: Principal | ICD-10-CM | POA: Diagnosis present

## 2014-06-24 DIAGNOSIS — M949 Disorder of cartilage, unspecified: Secondary | ICD-10-CM

## 2014-06-24 DIAGNOSIS — Z96619 Presence of unspecified artificial shoulder joint: Secondary | ICD-10-CM

## 2014-06-24 DIAGNOSIS — I129 Hypertensive chronic kidney disease with stage 1 through stage 4 chronic kidney disease, or unspecified chronic kidney disease: Secondary | ICD-10-CM | POA: Diagnosis present

## 2014-06-24 DIAGNOSIS — R5383 Other fatigue: Secondary | ICD-10-CM

## 2014-06-24 DIAGNOSIS — R0902 Hypoxemia: Secondary | ICD-10-CM | POA: Diagnosis present

## 2014-06-24 DIAGNOSIS — I480 Paroxysmal atrial fibrillation: Secondary | ICD-10-CM

## 2014-06-24 LAB — CBC WITH DIFFERENTIAL/PLATELET
BASOS ABS: 0 10*3/uL (ref 0.0–0.1)
Basophils Relative: 0 % (ref 0–1)
Eosinophils Absolute: 0 10*3/uL (ref 0.0–0.7)
Eosinophils Relative: 0 % (ref 0–5)
HCT: 33.2 % — ABNORMAL LOW (ref 36.0–46.0)
HEMOGLOBIN: 11.1 g/dL — AB (ref 12.0–15.0)
Lymphocytes Relative: 1 % — ABNORMAL LOW (ref 12–46)
Lymphs Abs: 0.2 10*3/uL — ABNORMAL LOW (ref 0.7–4.0)
MCH: 32.3 pg (ref 26.0–34.0)
MCHC: 33.4 g/dL (ref 30.0–36.0)
MCV: 96.5 fL (ref 78.0–100.0)
Monocytes Absolute: 0.6 10*3/uL (ref 0.1–1.0)
Monocytes Relative: 4 % (ref 3–12)
NEUTROS ABS: 15.6 10*3/uL — AB (ref 1.7–7.7)
NEUTROS PCT: 95 % — AB (ref 43–77)
Platelets: 226 10*3/uL (ref 150–400)
RBC: 3.44 MIL/uL — ABNORMAL LOW (ref 3.87–5.11)
RDW: 15 % (ref 11.5–15.5)
WBC: 16.3 10*3/uL — AB (ref 4.0–10.5)

## 2014-06-24 LAB — I-STAT CHEM 8, ED
BUN: 29 mg/dL — ABNORMAL HIGH (ref 6–23)
Calcium, Ion: 1.14 mmol/L (ref 1.13–1.30)
Chloride: 108 mEq/L (ref 96–112)
Creatinine, Ser: 2.1 mg/dL — ABNORMAL HIGH (ref 0.50–1.10)
Glucose, Bld: 124 mg/dL — ABNORMAL HIGH (ref 70–99)
HEMATOCRIT: 35 % — AB (ref 36.0–46.0)
HEMOGLOBIN: 11.9 g/dL — AB (ref 12.0–15.0)
POTASSIUM: 4.3 meq/L (ref 3.7–5.3)
SODIUM: 140 meq/L (ref 137–147)
TCO2: 19 mmol/L (ref 0–100)

## 2014-06-24 LAB — MRSA PCR SCREENING: MRSA by PCR: NEGATIVE

## 2014-06-24 LAB — I-STAT TROPONIN, ED: TROPONIN I, POC: 0.07 ng/mL (ref 0.00–0.08)

## 2014-06-24 LAB — GLUCOSE, CAPILLARY: Glucose-Capillary: 153 mg/dL — ABNORMAL HIGH (ref 70–99)

## 2014-06-24 LAB — URINALYSIS, ROUTINE W REFLEX MICROSCOPIC
Glucose, UA: NEGATIVE mg/dL
HGB URINE DIPSTICK: NEGATIVE
Ketones, ur: NEGATIVE mg/dL
Nitrite: NEGATIVE
Protein, ur: NEGATIVE mg/dL
Specific Gravity, Urine: 1.015 (ref 1.005–1.030)
UROBILINOGEN UA: 0.2 mg/dL (ref 0.0–1.0)
pH: 5 (ref 5.0–8.0)

## 2014-06-24 LAB — URINE MICROSCOPIC-ADD ON

## 2014-06-24 LAB — I-STAT CG4 LACTIC ACID, ED: Lactic Acid, Venous: 2.61 mmol/L — ABNORMAL HIGH (ref 0.5–2.2)

## 2014-06-24 LAB — PRO B NATRIURETIC PEPTIDE: PRO B NATRI PEPTIDE: 16090 pg/mL — AB (ref 0–450)

## 2014-06-24 MED ORDER — GUAIFENESIN-DM 100-10 MG/5ML PO SYRP: 5.0000 mL | ORAL_SOLUTION | ORAL | Status: DC | PRN

## 2014-06-24 MED ORDER — IPRATROPIUM BROMIDE 0.02 % IN SOLN
0.5000 mg | Freq: Four times a day (QID) | RESPIRATORY_TRACT | Status: DC
  Administered 2014-06-24 – 2014-06-25 (×6): 0.5 mg via RESPIRATORY_TRACT
  Filled 2014-06-24 (×6): qty 2.5

## 2014-06-24 MED ORDER — SODIUM CHLORIDE 0.9 % IV BOLUS (SEPSIS)
500.0000 mL | Freq: Once | INTRAVENOUS | Status: AC
  Administered 2014-06-24: 500 mL via INTRAVENOUS

## 2014-06-24 MED ORDER — SODIUM CHLORIDE 0.9 % IV BOLUS (SEPSIS)
500.0000 mL | Freq: Once | INTRAVENOUS | Status: AC
Start: 2014-06-24 — End: 2014-06-24
  Administered 2014-06-24: 500 mL via INTRAVENOUS

## 2014-06-24 MED ORDER — VANCOMYCIN HCL IN DEXTROSE 1-5 GM/200ML-% IV SOLN
1000.0000 mg | Freq: Once | INTRAVENOUS | Status: AC
  Administered 2014-06-24: 1000 mg via INTRAVENOUS
  Filled 2014-06-24: qty 200

## 2014-06-24 MED ORDER — SODIUM CHLORIDE 0.9 % IJ SOLN
3.0000 mL | Freq: Two times a day (BID) | INTRAMUSCULAR | Status: DC
  Administered 2014-06-24 (×2): 3 mL via INTRAVENOUS
  Administered 2014-06-25: 13:00:00 via INTRAVENOUS
  Administered 2014-06-26 – 2014-06-29 (×4): 3 mL via INTRAVENOUS

## 2014-06-24 MED ORDER — ALUM & MAG HYDROXIDE-SIMETH 200-200-20 MG/5ML PO SUSP: 30.0000 mL | Freq: Four times a day (QID) | ORAL | Status: DC | PRN

## 2014-06-24 MED ORDER — VANCOMYCIN HCL 500 MG IV SOLR
500.0000 mg | INTRAVENOUS | Status: DC
  Administered 2014-06-26: 500 mg via INTRAVENOUS
  Filled 2014-06-24: qty 500

## 2014-06-24 MED ORDER — ACETAMINOPHEN 650 MG RE SUPP: 650.0000 mg | Freq: Four times a day (QID) | RECTAL | Status: DC | PRN

## 2014-06-24 MED ORDER — SODIUM CHLORIDE 0.9 % IV BOLUS (SEPSIS)
250.0000 mL | Freq: Once | INTRAVENOUS | Status: AC
  Administered 2014-06-24: 250 mL via INTRAVENOUS

## 2014-06-24 MED ORDER — DABIGATRAN ETEXILATE MESYLATE 75 MG PO CAPS
75.0000 mg | ORAL_CAPSULE | Freq: Two times a day (BID) | ORAL | Status: DC
  Filled 2014-06-24: qty 1

## 2014-06-24 MED ORDER — PANTOPRAZOLE SODIUM 40 MG PO TBEC
40.0000 mg | DELAYED_RELEASE_TABLET | Freq: Every day | ORAL | Status: DC
Start: 2014-06-24 — End: 2014-06-30
  Administered 2014-06-24 – 2014-06-30 (×7): 40 mg via ORAL
  Filled 2014-06-24 (×7): qty 1

## 2014-06-24 MED ORDER — PIPERACILLIN-TAZOBACTAM IN DEX 2-0.25 GM/50ML IV SOLN
2.2500 g | Freq: Four times a day (QID) | INTRAVENOUS | Status: DC
  Administered 2014-06-24 – 2014-06-27 (×10): 2.25 g via INTRAVENOUS
  Filled 2014-06-24 (×13): qty 50

## 2014-06-24 MED ORDER — PIPERACILLIN-TAZOBACTAM 3.375 G IVPB 30 MIN
3.3750 g | Freq: Once | INTRAVENOUS | Status: AC
  Administered 2014-06-24: 3.375 g via INTRAVENOUS
  Filled 2014-06-24: qty 50

## 2014-06-24 MED ORDER — ACETAMINOPHEN 325 MG PO TABS
650.0000 mg | ORAL_TABLET | Freq: Four times a day (QID) | ORAL | Status: DC | PRN
  Administered 2014-06-25 – 2014-06-28 (×3): 650 mg via ORAL
  Filled 2014-06-24 (×3): qty 2

## 2014-06-24 MED ORDER — SODIUM CHLORIDE 0.9 % IV SOLN
INTRAVENOUS | Status: DC
  Administered 2014-06-24: 16:00:00 via INTRAVENOUS
  Administered 2014-06-25: 1000 mL via INTRAVENOUS
  Administered 2014-06-26: 01:00:00 via INTRAVENOUS

## 2014-06-24 MED ORDER — ONDANSETRON HCL 4 MG/2ML IJ SOLN
4.0000 mg | Freq: Four times a day (QID) | INTRAMUSCULAR | Status: DC | PRN
  Administered 2014-06-26 – 2014-06-28 (×2): 4 mg via INTRAVENOUS
  Filled 2014-06-24 (×2): qty 2

## 2014-06-24 MED ORDER — INSULIN ASPART 100 UNIT/ML ~~LOC~~ SOLN
0.0000 [IU] | Freq: Three times a day (TID) | SUBCUTANEOUS | Status: DC
  Administered 2014-06-24: 2 [IU] via SUBCUTANEOUS
  Administered 2014-06-25 – 2014-06-29 (×4): 1 [IU] via SUBCUTANEOUS

## 2014-06-24 MED ORDER — ONDANSETRON HCL 4 MG PO TABS
4.0000 mg | ORAL_TABLET | Freq: Four times a day (QID) | ORAL | Status: DC | PRN
  Administered 2014-06-28: 4 mg via ORAL
  Filled 2014-06-24: qty 1

## 2014-06-24 MED ORDER — HEPARIN SODIUM (PORCINE) 5000 UNIT/ML IJ SOLN: 5000.0000 [IU] | Freq: Three times a day (TID) | INTRAMUSCULAR | Status: DC

## 2014-06-24 MED ORDER — MORPHINE SULFATE 2 MG/ML IJ SOLN
1.0000 mg | INTRAMUSCULAR | Status: DC | PRN
  Administered 2014-06-26 – 2014-06-29 (×4): 1 mg via INTRAVENOUS
  Filled 2014-06-24 (×5): qty 1

## 2014-06-24 MED ORDER — GUAIFENESIN ER 600 MG PO TB12
1200.0000 mg | ORAL_TABLET | Freq: Two times a day (BID) | ORAL | Status: DC
Start: 2014-06-24 — End: 2014-06-30
  Administered 2014-06-24 – 2014-06-30 (×13): 1200 mg via ORAL
  Filled 2014-06-24 (×14): qty 2

## 2014-06-24 NOTE — ED Provider Notes (Signed)
CSN: [id number]     Arrival date & time 06/24/14  1053 History   First MD Initiated Contact with Patient 06/24/14 1059     Chief Complaint  Patient presents with  . Hypotension     (Consider location/radiation/quality/duration/timing/severity/associated sxs/prior Treatment) The history is provided by the patient.   patient was discharged to nursing home yesterday. She been in the hospital for pneumonia and UTI. Began to feel nauseous and weak. Also lower back pain, which she states is chronic. Found to be hypotensive and hypoxic for EMS. No fevers. Patient states she has had a somewhat decreased appetite. He states he is feeling good yesterday until today. She had diarrhea and was somewhat incontinent of urine also for EMS.  Past Medical History  Diagnosis Date  . Syncope   . Sinus node dysfunction     a. s/p pacemaker.  . Wide-complex tachycardia   . Cholelithiasis   . Glucose intolerance (impaired glucose tolerance)   . Anxiety   . PONV (postoperative nausea and vomiting)   . Chronic kidney disease     a. Baseline Cr reported 1.8-2.0.  . Injury of right upper arm     after venipuncture in 3/13 - pt describes pain afterwards- no followup done seen by PCP- no further treatment   . PAF (paroxysmal atrial fibrillation)   . Left bundle branch block   . Tachycardia-bradycardia syndrome   . CHF (congestive heart failure)     a. EF 30-35% by echo 2015.  [name] Pacemaker     Guidant, implanted 2006  . Type II diabetes mellitus     "recently dx'd" (03/09/2014)  . Breast cancer 2003    "radiation on right; mastectomy on the left"  . Hypertension   . GERD (gastroesophageal reflux disease)   . DJD (degenerative joint disease)   . Arthritis     "real bad in my lower back" (03/09/2014)  . Chronic lower back pain   . Orthostatic hypotension     a. Diuretics d/c'd 03/2014.  [name] PAT (paroxysmal atrial tachycardia)    Past Surgical History  Procedure Laterality Date  . Replacement total knee  bilateral Bilateral   . Total shoulder replacement    . Tonsillectomy    . Orif distal femur fracture Right 12/2007  . Tonsillectomy    . Mastectomy Left 2003  . Total hip arthroplasty  06/16/2012    Procedure: TOTAL HIP ARTHROPLASTY;  Surgeon: Mauri Pole, MD;  Location: [facility];  Service: Orthopedics;  Laterality: Left;  . Hip closed reduction  06/30/2012    Procedure: CLOSED MANIPULATION HIP;  Surgeon: Mauri Pole, MD;  Location: [facility];  Service: Orthopedics;  Laterality: Left;  . Abdominal hysterectomy    . Pacemaker insertion      Guidant Insignia I  . Cataract extraction w/ intraocular lens  implant, bilateral Bilateral 2000's  . Refractive surgery Bilateral 01/2014  . Inguinal hernia repair Left     "had emergency OR for it a few years ago" (03/09/2014)  . Joint replacement    . Breast biopsy Bilateral    Family History  Problem Relation Age of Onset  . Hypertension     History  Substance Use Topics  . Smoking status: Former Smoker -- 0.50 packs/day for 6 years    Types: Cigarettes  . Smokeless tobacco: Never Used  . Alcohol Use: Yes     Comment: "I'll have a social drink maybe at Chrismastime"   OB History   Grav Para Term  Preterm Abortions TAB SAB Ect Mult Living                 Review of Systems  Constitutional: Positive for appetite change and fatigue. Negative for fever.  Respiratory: Negative for shortness of breath.   Cardiovascular: Negative for chest pain.  Gastrointestinal: Positive for diarrhea.  Genitourinary: Negative for frequency.  Musculoskeletal: Negative for gait problem.  Skin: Negative for pallor and wound.      Allergies  Codeine; Hydrocodone-acetaminophen; and Other  Home Medications   Prior to Admission medications   Medication Sig Start Date End Date Taking? Authorizing Provider  acetaminophen (TYLENOL) 325 MG tablet Take 325 mg by mouth every 6 (six) hours as needed for pain.   Yes Historical Provider, MD  ALPRAZolam Duanne Moron)  0.25 MG tablet Take one tablet by mouth twice daily for anxiety 05/29/14  Yes Theodis Blaze, MD  dabigatran (PRADAXA) 75 MG CAPS Take 75 mg by mouth 2 (two) times daily.  05/29/11  Yes Deboraha Sprang, MD  diclofenac sodium (VOLTAREN) 1 % GEL Apply 2 g topically 4 (four) times daily. 11/25/12  Yes Geradine Girt, DO  docusate sodium 100 MG CAPS Take 100 mg by mouth 2 (two) times daily. 05/29/14  Yes Theodis Blaze, MD  metoprolol (LOPRESSOR) 50 MG tablet Take 1 tablet (50 mg total) by mouth 2 (two) times daily. 12/01/13  Yes Deboraha Sprang, MD  pantoprazole (PROTONIX) 40 MG tablet Take 40 mg by mouth daily.   Yes Historical Provider, MD  spironolactone (ALDACTONE) 25 MG tablet Take 25 mg by mouth daily. 05/19/14  Yes Historical Provider, MD   BP 115/66  Pulse 90  Temp(Src) 98.7 F (37.1 C) (Oral)  Resp 19  Ht 5\' 4"  (1.626 m)  Wt 113 lb 12.1 oz (51.6 kg)  BMI 19.52 kg/m2  SpO2 100% Physical Exam  Constitutional: She is oriented to person, place, and time. She appears well-developed.  HENT:  Head: Normocephalic.  Eyes: Pupils are equal, round, and reactive to light.  Cardiovascular: Normal rate.   Pulmonary/Chest:  Tachypnea. Few scattered rales diffusely.  Abdominal: Soft. There is no tenderness.  Musculoskeletal: Normal range of motion.  Neurological: She is alert and oriented to person, place, and time.  Skin: Skin is warm and dry.    ED Course  Procedures (including critical care time) Labs Review Labs Reviewed  CBC WITH DIFFERENTIAL - Abnormal; Notable for the following:    WBC 16.3 (*)    RBC 3.44 (*)    Hemoglobin 11.1 (*)    HCT 33.2 (*)    Neutrophils Relative % 95 (*)    Neutro Abs 15.6 (*)    Lymphocytes Relative 1 (*)    Lymphs Abs 0.2 (*)    All other components within normal limits  URINALYSIS, ROUTINE W REFLEX MICROSCOPIC - Abnormal; Notable for the following:    Color, Urine AMBER (*)    APPearance CLOUDY (*)    Bilirubin Urine SMALL (*)    Leukocytes, UA SMALL  (*)    All other components within normal limits  PRO B NATRIURETIC PEPTIDE - Abnormal; Notable for the following:    Pro B Natriuretic peptide (BNP) 16090.0 (*)    All other components within normal limits  URINE MICROSCOPIC-ADD ON - Abnormal; Notable for the following:    Bacteria, UA FEW (*)    Casts HYALINE CASTS (*)    All other components within normal limits  BASIC METABOLIC PANEL - Abnormal; Notable  for the following:    CO2 17 (*)    Glucose, Bld 106 (*)    BUN 37 (*)    Creatinine, Ser 2.13 (*)    Calcium 7.9 (*)    GFR calc non Af Amer 20 (*)    GFR calc Af Amer 23 (*)    All other components within normal limits  CBC - Abnormal; Notable for the following:    WBC 11.7 (*)    RBC 2.82 (*)    Hemoglobin 8.8 (*)    HCT 26.7 (*)    All other components within normal limits  GLUCOSE, CAPILLARY - Abnormal; Notable for the following:    Glucose-Capillary 153 (*)    All other components within normal limits  GLUCOSE, CAPILLARY - Abnormal; Notable for the following:    Glucose-Capillary 117 (*)    All other components within normal limits  I-STAT CG4 LACTIC ACID, ED - Abnormal; Notable for the following:    Lactic Acid, Venous 2.61 (*)    All other components within normal limits  I-STAT CHEM 8, ED - Abnormal; Notable for the following:    BUN 29 (*)    Creatinine, Ser 2.10 (*)    Glucose, Bld 124 (*)    Hemoglobin 11.9 (*)    HCT 35.0 (*)    All other components within normal limits  CULTURE, BLOOD (ROUTINE X 2)  CULTURE, BLOOD (ROUTINE X 2)  URINE CULTURE  MRSA PCR SCREENING  URINE CULTURE  Randolm Idol, ED    Imaging Review Dg Chest Port 1 View  06/24/2014   CLINICAL DATA:  Hypoxia.  Hypotension.  Short of breath.  EXAM: PORTABLE CHEST - 1 VIEW  COMPARISON:  05/27/2014  FINDINGS: Diffuse bilateral airspace disease is worse throughout the right and left lungs. Stable dual lead right subclavian pacemaker and leads which are intact. No pneumothorax. Mild  cardiomegaly.  IMPRESSION: Worsening diffuse bilateral airspace disease.   Electronically Signed   By: Maryclare Bean M.D.   On: 06/24/2014 11:38     EKG Interpretation None      MDM   Final diagnoses:  HCAP (healthcare-associated pneumonia)  Severe sepsis  UTI (lower urinary tract infection)    Patient presents with hypertension. Also hypoxic. Bilateral pneumonia on x-ray. Also has UTI. Will admit to step down unit. He vital signs are improved with IV fluid bolus.   CRITICAL CARE Performed by: Mackie Pai Total critical care time: 30 Critical care time was exclusive of separately billable procedures and treating other patients. Critical care was necessary to treat or prevent imminent or life-threatening deterioration. Critical care was time spent personally by me on the following activities: development of treatment plan with patient and/or surrogate as well as nursing, discussions with consultants, evaluation of patient's response to treatment, examination of patient, obtaining history from patient or surrogate, ordering and performing treatments and interventions, ordering and review of laboratory studies, ordering and review of radiographic studies, pulse oximetry and re-evaluation of patient's condition.  Jasper Riling. Alvino Chapel, MD 06/25/14 [phone]

## 2014-06-24 NOTE — ED Notes (Addendum)
Pt from home. Pt discharged from nursing home yesterday for recovering from pneumonia. Pt reports that at 0200 she began to have increased lower back pain with nausea and new bowel incontinence. BP 65/38

## 2014-06-24 NOTE — H&P (Signed)
[facility] History and Physical  TALIBAH COLASURDO YOV:[id number] DOB: 06/29/1924 DOA: 06/24/2014  Referring physician: Dr. Alvino Chapel PCP: [name], MD   Reason for admission: Severe sepsis  HPI: Sophia Oliver is a 80 y.o. female with past medical history of breast cancer, chronic systolic CHF with LVEF of 15-20% in May of 2015. Patient was discharged from the hospital on 7/26 after she was admitted for healthcare associated pneumonia and UTI. Patient was staying in a nursing home until one day prior to admission, she was discharged home, just to stay overnight and in the morning she felt she is nauseous and she was about to vomit so she came in to the hospital for further evaluation. She denies any fever, denies any chills, denies any cough or sputum production. In the ED initially was found to have low blood pressure of 65/38, blood pressure improved after boluses of IV fluids, patient also was slightly hypoxic requiring 4 L of oxygen to keep her saturation above 91%. Patient responded well to IV fluids, admitted to step down for further evaluation.  Review of Systems:  Constitutional: negative for anorexia, fevers and sweats Eyes: negative for irritation, redness and visual disturbance Ears, nose, mouth, throat, and face: negative for earaches, epistaxis, nasal congestion and sore throat Respiratory: negative for cough, dyspnea on exertion, sputum and wheezing Cardiovascular: negative for chest pain, dyspnea, lower extremity edema, orthopnea, palpitations and syncope Gastrointestinal: negative for abdominal pain, constipation, diarrhea, melena, nausea and vomiting Genitourinary:negative for dysuria, frequency and hematuria Hematologic/lymphatic: negative for bleeding, easy bruising and lymphadenopathy Musculoskeletal:negative for arthralgias, muscle weakness and stiff joints Neurological: negative for coordination problems, gait problems, headaches and weakness Endocrine: negative for  diabetic symptoms including polydipsia, polyuria and weight loss Allergic/Immunologic: negative for anaphylaxis, hay fever and urticaria  Past Medical History  Diagnosis Date  . Syncope   . Sinus node dysfunction     a. s/p pacemaker.  . Wide-complex tachycardia   . Cholelithiasis   . Glucose intolerance (impaired glucose tolerance)   . Anxiety   . PONV (postoperative nausea and vomiting)   . Chronic kidney disease     a. Baseline Cr reported 1.8-2.0.  . Injury of right upper arm     after venipuncture in 3/13 - pt describes pain afterwards- no followup done seen by PCP- no further treatment   . PAF (paroxysmal atrial fibrillation)   . Left bundle branch block   . Tachycardia-bradycardia syndrome   . CHF (congestive heart failure)     a. EF 30-35% by echo 2015.  [name] Pacemaker     Guidant, implanted 2006  . Type II diabetes mellitus     "recently dx'd" (03/09/2014)  . Breast cancer 2003    "radiation on right; mastectomy on the left"  . Hypertension   . GERD (gastroesophageal reflux disease)   . DJD (degenerative joint disease)   . Arthritis     "real bad in my lower back" (03/09/2014)  . Chronic lower back pain   . Orthostatic hypotension     a. Diuretics d/c'd 03/2014.  [name] PAT (paroxysmal atrial tachycardia)    Past Surgical History  Procedure Laterality Date  . Replacement total knee bilateral Bilateral   . Total shoulder replacement    . Tonsillectomy    . Orif distal femur fracture Right 12/2007  . Tonsillectomy    . Mastectomy Left 2003  . Total hip arthroplasty  06/16/2012    Procedure: TOTAL HIP ARTHROPLASTY;  Surgeon: Mauri Pole, MD;  Location: [facility];  Service: Orthopedics;  Laterality: Left;  . Hip closed reduction  06/30/2012    Procedure: CLOSED MANIPULATION HIP;  Surgeon: Mauri Pole, MD;  Location: [facility];  Service: Orthopedics;  Laterality: Left;  . Abdominal hysterectomy    . Pacemaker insertion      Guidant Insignia I  . Cataract extraction w/  intraocular lens  implant, bilateral Bilateral 2000's  . Refractive surgery Bilateral 01/2014  . Inguinal hernia repair Left     "had emergency OR for it a few years ago" (03/09/2014)  . Joint replacement    . Breast biopsy Bilateral    Social History:   reports that she has quit smoking. Her smoking use included Cigarettes. She has a 3 pack-year smoking history. She has never used smokeless tobacco. She reports that she drinks alcohol. She reports that she does not use illicit drugs.  Allergies  Allergen Reactions  . Codeine Nausea And Vomiting  . Hydrocodone-Acetaminophen Itching  . Other Nausea And Vomiting    Narcotics  dizziness    Family History  Problem Relation Age of Onset  . Hypertension       Prior to Admission medications   Medication Sig Start Date End Date Taking? Authorizing Provider  acetaminophen (TYLENOL) 325 MG tablet Take 325 mg by mouth every 6 (six) hours as needed for pain.   Yes Historical Provider, MD  ALPRAZolam Duanne Moron) 0.25 MG tablet Take one tablet by mouth twice daily for anxiety 05/29/14  Yes Theodis Blaze, MD  dabigatran (PRADAXA) 75 MG CAPS Take 75 mg by mouth 2 (two) times daily.  05/29/11  Yes Deboraha Sprang, MD  diclofenac sodium (VOLTAREN) 1 % GEL Apply 2 g topically 4 (four) times daily. 11/25/12  Yes Geradine Girt, DO  docusate sodium 100 MG CAPS Take 100 mg by mouth 2 (two) times daily. 05/29/14  Yes Theodis Blaze, MD  metoprolol (LOPRESSOR) 50 MG tablet Take 1 tablet (50 mg total) by mouth 2 (two) times daily. 12/01/13  Yes Deboraha Sprang, MD  pantoprazole (PROTONIX) 40 MG tablet Take 40 mg by mouth daily.   Yes Historical Provider, MD  spironolactone (ALDACTONE) 25 MG tablet Take 25 mg by mouth daily. 05/19/14  Yes Historical Provider, MD   Physical Exam: Filed Vitals:   06/24/14 1346  BP: 123/59  Pulse: 63  Temp:   Resp: 18   Constitutional: Fragile-looking Caucasian female with a husband at bedside  Head: Normocephalic and atraumatic.    Nose: Nose normal.  Mouth/Throat: Uvula is midline, oropharynx is clear and moist and mucous membranes are normal.  Eyes: Conjunctivae and EOM are normal. Pupils are equal, round, and reactive to light.  Neck: Trachea normal and normal range of motion. Neck supple.  Cardiovascular: Normal rate, regular rhythm, S1 normal, S2 normal, normal heart sounds and intact distal pulses.   Pulmonary/Chest: Effort normal and breath sounds normal.  Abdominal: Soft. Bowel sounds are normal. There is no hepatosplenomegaly. There is no tenderness.  Musculoskeletal: Normal range of motion.  Neurological: Alert, oriented to place and person, rest of exam not done.  Skin: Skin is warm, dry and intact.  Psychiatric: Has a normal mood and affect. Speech is normal and behavior is normal.   Labs on Admission:  Basic Metabolic Panel:  Recent Labs Lab 06/24/14 1125  NA 140  K 4.3  CL 108  GLUCOSE 124*  BUN 29*  CREATININE 2.10*   Liver Function Tests: No results found for  this basename: AST, ALT, ALKPHOS, BILITOT, PROT, ALBUMIN,  in the last 168 hours No results found for this basename: LIPASE, AMYLASE,  in the last 168 hours No results found for this basename: AMMONIA,  in the last 168 hours CBC:  Recent Labs Lab 06/24/14 1113 06/24/14 1125  WBC 16.3*  --   NEUTROABS 15.6*  --   HGB 11.1* 11.9*  HCT 33.2* 35.0*  MCV 96.5  --   PLT 226  --    Cardiac Enzymes: No results found for this basename: CKTOTAL, CKMB, CKMBINDEX, TROPONINI,  in the last 168 hours  BNP (last 3 results)  Recent Labs  12/06/13 1129 06/24/14 1113  PROBNP 200.0* 16090.0*   CBG: No results found for this basename: GLUCAP,  in the last 168 hours  Radiological Exams on Admission: Dg Chest Port 1 View  06/24/2014   CLINICAL DATA:  Hypoxia.  Hypotension.  Short of breath.  EXAM: PORTABLE CHEST - 1 VIEW  COMPARISON:  05/27/2014  FINDINGS: Diffuse bilateral airspace disease is worse throughout the right and left lungs.  Stable dual lead right subclavian pacemaker and leads which are intact. No pneumothorax. Mild cardiomegaly.  IMPRESSION: Worsening diffuse bilateral airspace disease.   Electronically Signed   By: Maryclare Bean M.D.   On: 06/24/2014 11:38    EKG: Independently reviewed.   Assessment/Plan Principal Problem:   Severe sepsis Active Problems:   CKD (chronic kidney disease), stage III   UTI (lower urinary tract infection)   Chronic systolic CHF (congestive heart failure)   Type II or unspecified type diabetes mellitus without mention of complication, not stated as uncontrolled   Lytic bone lesions on xray   HCAP (healthcare-associated pneumonia)    Severe sepsis -Patient presented with a respiratory rate of 21, WBC of 16.3, SBP of 55 and suspected infection. -Patient has UTI and likely has healthcare associated pneumonia. -Hypotension resolved after IV fluids hydration in the ED. -Patient admitted to step down  UTI -Urinalysis consistent with UTI, patient started on Zosyn. -Follow cultures and adjust antibiotics accordingly.  Healthcare associated pneumonia -Patient presented with worsening infiltrates, unclear to me if it's secondary to pneumonia or atypical pulmonary edema. -Patient denies any pleuritic chest pain or productive cough. -Anyway patient started empirically on Zosyn and vancomycin. -Follow patient closely, continue supportive management with antitussives, bronchodilators and oxygen.  Chronic systolic CHF -Patient has LVEF of 15-20% measured on May of 2015. -Patient does not have lower extremity edema.  -There is lung infiltrates unclear if it's been resolving pneumonia patches or atypical pulmonary edema. -Patient given IV fluids in the ED because of hypotension, I am continuing the IV fluids. -If patient developed more SOB, patient will need vasopressors rather than continue on IV fluids and worsening the edema. -I will have very low threshold to consult PCCM on her,  patient is full code.  CKD stage 3-4 -Creatinine baseline is 1.6-2.0. Presented with creatinine of 2.1. -In spite of high proBNP patient does not have fluid overload clinically, no pedal edema no sacral edema.  Atrial fibrillation -Patient is on Lopressor for rate control and PRADAXA. -Lopressor held because of low blood pressure, likely need to be started as soon as possible to avoid reflex tachycardia.  Bony lytic lesions -CT head from May of 2015 showing bony lytic lesion involving the temporal and parietal bones. -At the time question of multiple myeloma, please note that patient has history of breast cancer.  Code Status: Full code Family Communication: Plan discussed with the  patient in the presence of her husband at bedside. Disposition Plan: Stepdown  Time spent: 70 minutes  [facility] Pager [phone]

## 2014-06-24 NOTE — ED Notes (Signed)
Lactic acid results given to dr pickering.Marland Kitchenklj

## 2014-06-24 NOTE — ED Notes (Signed)
hospitalist at bedside

## 2014-06-24 NOTE — ED Notes (Signed)
Bed: RX45 Expected date:  Expected time:  Means of arrival:  Comments: Hold EMS - n/v/d, low bp

## 2014-06-24 NOTE — Progress Notes (Signed)
ANTIBIOTIC CONSULT NOTE - INITIAL  Pharmacy Consult for Vancomycin / Zosyn Indication: HCAP / UTI / Sepsis  Allergies  Allergen Reactions  . Codeine Nausea And Vomiting  . Hydrocodone-Acetaminophen Itching  . Other Nausea And Vomiting    Narcotics  dizziness    Patient Measurements: Height: 5\' 4"  (162.6 cm) (taken 06/20/14) Weight: 108 lb 3.2 oz (49.079 kg) (taken 06/20/14) IBW/kg (Calculated) : 54.7  Vital Signs: Temp: 98.5 F (36.9 C) (08/21 1128) Temp src: Rectal (08/21 1128) BP: 115/64 mmHg (08/21 1445) Pulse Rate: 64 (08/21 1430) Intake/Output from previous day:   Intake/Output from this shift:    Labs:  Recent Labs  06/24/14 1113 06/24/14 1125  WBC 16.3*  --   HGB 11.1* 11.9*  PLT 226  --   CREATININE  --  2.10*   Estimated Creatinine Clearance: 14.6 ml/min (by C-G formula based on Cr of 2.1). No results found for this basename: VANCOTROUGH, VANCOPEAK, VANCORANDOM, GENTTROUGH, GENTPEAK, GENTRANDOM, TOBRATROUGH, TOBRAPEAK, TOBRARND, AMIKACINPEAK, AMIKACINTROU, AMIKACIN,  in the last 72 hours   Microbiology: No results found for this or any previous visit (from the past 720 hour(s)).  Medical History: Past Medical History  Diagnosis Date  . Syncope   . Sinus node dysfunction     a. s/p pacemaker.  . Wide-complex tachycardia   . Cholelithiasis   . Glucose intolerance (impaired glucose tolerance)   . Anxiety   . PONV (postoperative nausea and vomiting)   . Chronic kidney disease     a. Baseline Cr reported 1.8-2.0.  . Injury of right upper arm     after venipuncture in 3/13 - pt describes pain afterwards- no followup done seen by PCP- no further treatment   . PAF (paroxysmal atrial fibrillation)   . Left bundle branch block   . Tachycardia-bradycardia syndrome   . CHF (congestive heart failure)     a. EF 30-35% by echo 2015.  [name] Pacemaker     Guidant, implanted 2006  . Type II diabetes mellitus     "recently dx'd" (03/09/2014)  . Breast cancer 2003     "radiation on right; mastectomy on the left"  . Hypertension   . GERD (gastroesophageal reflux disease)   . DJD (degenerative joint disease)   . Arthritis     "real bad in my lower back" (03/09/2014)  . Chronic lower back pain   . Orthostatic hypotension     a. Diuretics d/c'd 03/2014.  [name] PAT (paroxysmal atrial tachycardia)     Assessment: 77 yoF with PMH of breast CA, CHF. Recently discharged from hospital on 7/26 after admitted for HCAP & UTI. Pharmacy consulted to dose Vancomycin and Zosyn for sepsis. (First doses given in ED)  8/21 >>Vanc >> 8/21 >>Zosyn >>   Tmax: afebrile WBCs: 16.3 Renal: SCr 2.10 (baseline 1.6-2) CrCl 14 ml/min (21 N)  Goal of Therapy:  Vancomycin trough level 15-20 mcg/ml Appropriate antibiotic dosing for renal function; eradication of infection  Plan:   Start Vancomycin 500mg  IV Q48H & Zosyn 2.25g IV Q6H  Reduced doses due to impaired renal function   Measure antibiotic drug levels at steady state  Follow up culture results  Kizzie Furnish, PharmD Pager: [phone] 06/24/2014 4:08 PM

## 2014-06-25 DIAGNOSIS — E440 Moderate protein-calorie malnutrition: Secondary | ICD-10-CM

## 2014-06-25 DIAGNOSIS — I5021 Acute systolic (congestive) heart failure: Secondary | ICD-10-CM

## 2014-06-25 DIAGNOSIS — E875 Hyperkalemia: Secondary | ICD-10-CM

## 2014-06-25 LAB — CBC
HEMATOCRIT: 26.7 % — AB (ref 36.0–46.0)
Hemoglobin: 8.8 g/dL — ABNORMAL LOW (ref 12.0–15.0)
MCH: 31.2 pg (ref 26.0–34.0)
MCHC: 33 g/dL (ref 30.0–36.0)
MCV: 94.7 fL (ref 78.0–100.0)
Platelets: 161 10*3/uL (ref 150–400)
RBC: 2.82 MIL/uL — ABNORMAL LOW (ref 3.87–5.11)
RDW: 15.1 % (ref 11.5–15.5)
WBC: 11.7 10*3/uL — ABNORMAL HIGH (ref 4.0–10.5)

## 2014-06-25 LAB — GLUCOSE, CAPILLARY
Glucose-Capillary: 117 mg/dL — ABNORMAL HIGH (ref 70–99)
Glucose-Capillary: 136 mg/dL — ABNORMAL HIGH (ref 70–99)
Glucose-Capillary: 94 mg/dL (ref 70–99)
Glucose-Capillary: 101 mg/dL — ABNORMAL HIGH (ref 70–99)

## 2014-06-25 LAB — BASIC METABOLIC PANEL
Anion gap: 13 (ref 5–15)
BUN: 37 mg/dL — ABNORMAL HIGH (ref 6–23)
CALCIUM: 7.9 mg/dL — AB (ref 8.4–10.5)
CO2: 17 mEq/L — ABNORMAL LOW (ref 19–32)
CREATININE: 2.13 mg/dL — AB (ref 0.50–1.10)
Chloride: 108 mEq/L (ref 96–112)
GFR calc Af Amer: 23 mL/min — ABNORMAL LOW (ref >90)
GFR calc non Af Amer: 20 mL/min — ABNORMAL LOW (ref >90)
Glucose, Bld: 106 mg/dL — ABNORMAL HIGH (ref 70–99)
Potassium: 4.7 mEq/L (ref 3.7–5.3)
Sodium: 138 mEq/L (ref 137–147)

## 2014-06-25 LAB — URINE CULTURE
Colony Count: NO GROWTH
Culture: NO GROWTH

## 2014-06-25 MED ORDER — VITAMINS A & D EX OINT
TOPICAL_OINTMENT | CUTANEOUS | Status: AC
  Filled 2014-06-25: qty 5

## 2014-06-25 MED ORDER — SODIUM CHLORIDE 0.9 % IV BOLUS (SEPSIS)
500.0000 mL | Freq: Once | INTRAVENOUS | Status: AC
  Administered 2014-06-25: 500 mL via INTRAVENOUS

## 2014-06-25 MED ORDER — HEPARIN SODIUM (PORCINE) 5000 UNIT/ML IJ SOLN
5000.0000 [IU] | Freq: Three times a day (TID) | INTRAMUSCULAR | Status: DC
  Administered 2014-06-25 – 2014-06-30 (×15): 5000 [IU] via SUBCUTANEOUS
  Filled 2014-06-25 (×19): qty 1

## 2014-06-25 NOTE — Progress Notes (Signed)
[facility] PROGRESS NOTE   Sophia Oliver HER:[id number] DOB: April 12, 1924 DOA: 06/24/2014 PCP: [name], MD  HPI/Subjective: Denies any major complaints, no fever, pain or SOB.  Assessment/Plan: Principal Problem:   Severe sepsis Active Problems:   CKD (chronic kidney disease), stage III   UTI (lower urinary tract infection)   Chronic systolic CHF (congestive heart failure)   Type II or unspecified type diabetes mellitus without mention of complication, not stated as uncontrolled   Lytic bone lesions on xray   HCAP (healthcare-associated pneumonia)    Severe sepsis  -Patient presented with a respiratory rate of 21, WBC of 16.3, SBP of 55 and suspected infection.  -Patient has UTI and likely has healthcare associated pneumonia.  -Hypotension resolved after IV fluids hydration in the ED.  -Patient admitted to step down   UTI  -Urinalysis consistent with UTI, patient started on Zosyn.  -Follow cultures and adjust antibiotics accordingly.   Healthcare associated pneumonia  -Patient presented with worsening infiltrates, unclear to me if it's secondary to pneumonia or atypical pulmonary edema.  -Patient denies any pleuritic chest pain or productive cough.  -Anyway patient started empirically on Zosyn and vancomycin.  -Follow patient closely, continue supportive management with antitussives, bronchodilators and oxygen.   Chronic systolic CHF  -Patient has LVEF of 15-20% measured on May of 2015.  -Patient does not have lower extremity edema.  -There is lung infiltrates unclear if it's been resolving pneumonia patches or atypical pulmonary edema.  -Patient given IV fluids in the ED because of hypotension, I am continuing the IV fluids.  -If patient developed more SOB, patient will need vasopressors rather than continue on IV fluids and worsening the edema.  -I will have very low threshold to consult PCCM on her, patient is full code.   CKD stage 3-4  -Creatinine baseline  is 1.6-2.0. Presented with creatinine of 2.1.  -In spite of high proBNP patient does not have fluid overload clinically, no pedal edema no sacral edema.   Atrial fibrillation  -Patient is on Lopressor for rate control and PRADAXA.  -Lopressor held because of low blood pressure, likely need to be started as soon as possible to avoid reflex tachycardia.  -Hold PradaXa as creatinine worsens since July  Bony lytic lesions  -CT head from May of 2015 showing bony lytic lesion involving the temporal and parietal bones.  -At the time question of multiple myeloma, please note that patient has history of breast cancer.   Code Status: Full code Family Communication: Plan discussed with the patient. Disposition Plan: Remains inpatient   Consultants:  None  Procedures:  None  Antibiotics:  Zosyn and vanc   Objective: Filed Vitals:   06/25/14 0800  BP:   Pulse:   Temp: 97.9 F (36.6 C)  Resp:     Intake/Output Summary (Last 24 hours) at 06/25/14 0954 Last data filed at 06/25/14 0800  Gross per 24 hour  Intake 2348.75 ml  Output    265 ml  Net 2083.75 ml   Filed Weights   06/24/14 1153 06/24/14 1600 06/25/14 0400  Weight: 49.079 kg (108 lb 3.2 oz) 50 kg (110 lb 3.7 oz) 51.6 kg (113 lb 12.1 oz)    Exam: General: Alert and awake, oriented x3, not in any acute distress. HEENT: anicteric sclera, pupils reactive to light and accommodation, EOMI CVS: S1-S2 clear, no murmur rubs or gallops Chest: clear to auscultation bilaterally, no wheezing, rales or rhonchi Abdomen: soft nontender, nondistended, normal bowel sounds, no organomegaly Extremities:  no cyanosis, clubbing or edema noted bilaterally Neuro: Cranial nerves II-XII intact, no focal neurological deficits  Data Reviewed: Basic Metabolic Panel:  Recent Labs Lab 06/24/14 1125 06/25/14 0340  NA 140 138  K 4.3 4.7  CL 108 108  CO2  --  17*  GLUCOSE 124* 106*  BUN 29* 37*  CREATININE 2.10* 2.13*  CALCIUM  --   7.9*   Liver Function Tests: No results found for this basename: AST, ALT, ALKPHOS, BILITOT, PROT, ALBUMIN,  in the last 168 hours No results found for this basename: LIPASE, AMYLASE,  in the last 168 hours No results found for this basename: AMMONIA,  in the last 168 hours CBC:  Recent Labs Lab 06/24/14 1113 06/24/14 1125 06/25/14 0340  WBC 16.3*  --  11.7*  NEUTROABS 15.6*  --   --   HGB 11.1* 11.9* 8.8*  HCT 33.2* 35.0* 26.7*  MCV 96.5  --  94.7  PLT 226  --  161   Cardiac Enzymes: No results found for this basename: CKTOTAL, CKMB, CKMBINDEX, TROPONINI,  in the last 168 hours BNP (last 3 results)  Recent Labs  12/06/13 1129 06/24/14 1113  PROBNP 200.0* 16090.0*   CBG:  Recent Labs Lab 06/24/14 1638 06/25/14 0746  GLUCAP 153* 117*    Micro Recent Results (from the past 240 hour(s))  MRSA PCR SCREENING     Status: None   Collection Time    06/24/14  3:35 PM      Result Value Ref Range Status   MRSA by PCR NEGATIVE  NEGATIVE Final   Comment:            The GeneXpert MRSA Assay (FDA     approved for NASAL specimens     only), is one component of a     comprehensive MRSA colonization     surveillance program. It is not     intended to diagnose MRSA     infection nor to guide or     monitor treatment for     MRSA infections.     Studies: Dg Chest Port 1 View  06/24/2014   CLINICAL DATA:  Hypoxia.  Hypotension.  Short of breath.  EXAM: PORTABLE CHEST - 1 VIEW  COMPARISON:  05/27/2014  FINDINGS: Diffuse bilateral airspace disease is worse throughout the right and left lungs. Stable dual lead right subclavian pacemaker and leads which are intact. No pneumothorax. Mild cardiomegaly.  IMPRESSION: Worsening diffuse bilateral airspace disease.   Electronically Signed   By: Maryclare Bean M.D.   On: 06/24/2014 11:38    Scheduled Meds: . guaiFENesin  1,200 mg Oral BID  . insulin aspart  0-9 Units Subcutaneous TID WC  . ipratropium  0.5 mg Nebulization QID  .  pantoprazole  40 mg Oral Daily  . piperacillin-tazobactam (ZOSYN)  IV  2.25 g Intravenous 4 times per day  . sodium chloride  3 mL Intravenous Q12H  . [START ON 06/26/2014] vancomycin  500 mg Intravenous Q48H   Continuous Infusions: . sodium chloride 75 mL/hr at 06/24/14 1557       Time spent: 35 minutes    [facility] A  [facility] Pager [phone] If 7PM-7AM, please contact night-coverage at [email or web address], password [facility] 06/25/2014, 9:54 AM  LOS: 1 day

## 2014-06-26 DIAGNOSIS — I5022 Chronic systolic (congestive) heart failure: Secondary | ICD-10-CM

## 2014-06-26 DIAGNOSIS — I509 Heart failure, unspecified: Secondary | ICD-10-CM

## 2014-06-26 LAB — CBC
HCT: 28.9 % — ABNORMAL LOW (ref 36.0–46.0)
Hemoglobin: 9.6 g/dL — ABNORMAL LOW (ref 12.0–15.0)
MCH: 31.7 pg (ref 26.0–34.0)
MCHC: 33.2 g/dL (ref 30.0–36.0)
MCV: 95.4 fL (ref 78.0–100.0)
Platelets: 166 10*3/uL (ref 150–400)
RBC: 3.03 MIL/uL — AB (ref 3.87–5.11)
RDW: 15.3 % (ref 11.5–15.5)
WBC: 10.3 10*3/uL (ref 4.0–10.5)

## 2014-06-26 LAB — GLUCOSE, CAPILLARY
GLUCOSE-CAPILLARY: 93 mg/dL (ref 70–99)
GLUCOSE-CAPILLARY: 107 mg/dL — AB (ref 70–99)
Glucose-Capillary: 96 mg/dL (ref 70–99)
Glucose-Capillary: 138 mg/dL — ABNORMAL HIGH (ref 70–99)

## 2014-06-26 LAB — URINE CULTURE
COLONY COUNT: NO GROWTH
CULTURE: NO GROWTH

## 2014-06-26 LAB — COMPREHENSIVE METABOLIC PANEL
ALK PHOS: 58 U/L (ref 39–117)
ALT: 36 U/L — AB (ref 0–35)
AST: 28 U/L (ref 0–37)
Albumin: 2.5 g/dL — ABNORMAL LOW (ref 3.5–5.2)
Anion gap: 17 — ABNORMAL HIGH (ref 5–15)
BILIRUBIN TOTAL: 0.5 mg/dL (ref 0.3–1.2)
BUN: 39 mg/dL — ABNORMAL HIGH (ref 6–23)
CALCIUM: 8.4 mg/dL (ref 8.4–10.5)
CHLORIDE: 108 meq/L (ref 96–112)
CO2: 13 meq/L — AB (ref 19–32)
Creatinine, Ser: 2.02 mg/dL — ABNORMAL HIGH (ref 0.50–1.10)
GFR calc Af Amer: 24 mL/min — ABNORMAL LOW (ref >90)
GFR, EST NON AFRICAN AMERICAN: 21 mL/min — AB (ref >90)
Glucose, Bld: 92 mg/dL (ref 70–99)
Potassium: 4.3 mEq/L (ref 3.7–5.3)
SODIUM: 138 meq/L (ref 137–147)
Total Protein: 5.3 g/dL — ABNORMAL LOW (ref 6.0–8.3)

## 2014-06-26 MED ORDER — MENTHOL 3 MG MT LOZG
1.0000 | LOZENGE | OROMUCOSAL | Status: DC | PRN
  Filled 2014-06-26: qty 9

## 2014-06-26 MED ORDER — LORAZEPAM 0.5 MG PO TABS
0.5000 mg | ORAL_TABLET | Freq: Once | ORAL | Status: AC
  Administered 2014-06-26: 0.5 mg via ORAL
  Filled 2014-06-26: qty 1

## 2014-06-26 MED ORDER — MORPHINE SULFATE 2 MG/ML IJ SOLN
1.0000 mg | Freq: Once | INTRAMUSCULAR | Status: AC
  Administered 2014-06-26: 1 mg via INTRAVENOUS

## 2014-06-26 NOTE — Progress Notes (Signed)
Pt has been restless and c/o pain in her lower back.  [facility] made aware.new orders received and initiated.

## 2014-06-26 NOTE — Progress Notes (Signed)
Patient transferred from ICU by [facility]. On assessment, patient laying comfortable laying in bed with no pain. Agree with previous assessment.

## 2014-06-26 NOTE — Progress Notes (Signed)
[facility] PROGRESS NOTE   Sophia Oliver LGX:[id number] DOB: January 28, 1924 DOA: 06/24/2014 PCP: [name], MD  HPI/Subjective: Complains about sore throat, otherwise no major complaints.  Assessment/Plan: Principal Problem:   Severe sepsis Active Problems:   CKD (chronic kidney disease), stage III   UTI (lower urinary tract infection)   Chronic systolic CHF (congestive heart failure)   Type II or unspecified type diabetes mellitus without mention of complication, not stated as uncontrolled   Lytic bone lesions on xray   HCAP (healthcare-associated pneumonia)    Severe sepsis  -Patient presented with a respiratory rate of 21, WBC of 16.3, SBP of 55 and suspected infection.  -Patient has UTI and likely has healthcare associated pneumonia.  -Hypotension resolved after IV fluids hydration in the ED.  -Patient admitted to step down  -Blood pressure is controlled, continue IV antibiotics, transferred to telemetry and discontinue IV fluids.  UTI  -Urinalysis consistent with UTI, patient started on Zosyn.  -Follow cultures and adjust antibiotics accordingly.   Healthcare associated pneumonia  -Patient presented with worsening infiltrates, unclear to me if it's secondary to pneumonia or atypical pulmonary edema.  -Patient denies any pleuritic chest pain or productive cough.  -Anyway patient started empirically on Zosyn and vancomycin.  -Follow patient closely, continue supportive management with antitussives, bronchodilators and oxygen.   Chronic systolic CHF  -Patient has LVEF of 15-20% measured on May of 2015.  -Patient does not have lower extremity edema.  -There is lung infiltrates unclear if it's been resolving pneumonia patches or atypical pulmonary edema.  -Patient given IV fluids in the ED because of hypotension. -If patient developed more SOB, patient will need vasopressors rather than continue on IV fluids and worsening the edema.  -I will have very low threshold to  consult PCCM on her, patient is full code.   CKD stage 3-4  -Creatinine baseline is 1.6-2.0. Presented with creatinine of 2.1.  -In spite of high proBNP patient does not have fluid overload clinically, no pedal edema no sacral edema.   Atrial fibrillation  -Patient is on Lopressor for rate control and PRADAXA.  -Lopressor held because of low blood pressure, likely need to be started as soon as possible to avoid reflex tachycardia.  -Hold PradaXa as creatinine worsens since July  Bony lytic lesions  -CT head from May of 2015 showing bony lytic lesion involving the temporal and parietal bones.  -At the time question of multiple myeloma, please note that patient has history of breast cancer.   Code Status: Full code Family Communication: Plan discussed with the patient. Disposition Plan: Remains inpatient   Consultants:  None  Procedures:  None  Antibiotics:  Zosyn and vanc   Objective: Filed Vitals:   06/26/14 0900  BP:   Pulse:   Temp: 98.2 F (36.8 C)  Resp:     Intake/Output Summary (Last 24 hours) at 06/26/14 0948 Last data filed at 06/26/14 8144  Gross per 24 hour  Intake   1775 ml  Output    600 ml  Net   1175 ml   Filed Weights   06/24/14 1600 06/25/14 0400 06/26/14 0400  Weight: 50 kg (110 lb 3.7 oz) 51.6 kg (113 lb 12.1 oz) 54.7 kg (120 lb 9.5 oz)    Exam: General: Alert and awake, oriented x3, not in any acute distress. HEENT: anicteric sclera, pupils reactive to light and accommodation, EOMI CVS: S1-S2 clear, no murmur rubs or gallops Chest: clear to auscultation bilaterally, no wheezing, rales or rhonchi  Abdomen: soft nontender, nondistended, normal bowel sounds, no organomegaly Extremities: no cyanosis, clubbing or edema noted bilaterally Neuro: Cranial nerves II-XII intact, no focal neurological deficits  Data Reviewed: Basic Metabolic Panel:  Recent Labs Lab 06/24/14 1125 06/25/14 0340 06/26/14 0406  NA 140 138 138  K 4.3 4.7 4.3    CL 108 108 108  CO2  --  17* 13*  GLUCOSE 124* 106* 92  BUN 29* 37* 39*  CREATININE 2.10* 2.13* 2.02*  CALCIUM  --  7.9* 8.4   Liver Function Tests:  Recent Labs Lab 06/26/14 0406  AST 28  ALT 36*  ALKPHOS 58  BILITOT 0.5  PROT 5.3*  ALBUMIN 2.5*   No results found for this basename: LIPASE, AMYLASE,  in the last 168 hours No results found for this basename: AMMONIA,  in the last 168 hours CBC:  Recent Labs Lab 06/24/14 1113 06/24/14 1125 06/25/14 0340 06/26/14 0406  WBC 16.3*  --  11.7* 10.3  NEUTROABS 15.6*  --   --   --   HGB 11.1* 11.9* 8.8* 9.6*  HCT 33.2* 35.0* 26.7* 28.9*  MCV 96.5  --  94.7 95.4  PLT 226  --  161 166   Cardiac Enzymes: No results found for this basename: CKTOTAL, CKMB, CKMBINDEX, TROPONINI,  in the last 168 hours BNP (last 3 results)  Recent Labs  12/06/13 1129 06/24/14 1113  PROBNP 200.0* 16090.0*   CBG:  Recent Labs Lab 06/25/14 0746 06/25/14 1228 06/25/14 1756 06/25/14 2210 06/26/14 0750  GLUCAP 117* 136* 94 101* 93    Micro Recent Results (from the past 240 hour(s))  CULTURE, BLOOD (ROUTINE X 2)     Status: None   Collection Time    06/24/14 11:13 AM      Result Value Ref Range Status   Specimen Description BLOOD RIGHT WRIST   Final   Special Requests BOTTLES DRAWN AEROBIC AND ANAEROBIC 6 CC EA   Final   Culture  Setup Time     Final   Value: 06/24/2014 14:06     Performed at [facility]   Culture     Final   Value:        BLOOD CULTURE RECEIVED NO GROWTH TO DATE CULTURE WILL BE HELD FOR 5 DAYS BEFORE ISSUING A FINAL NEGATIVE REPORT     Performed at [facility]   Report Status PENDING   Incomplete  CULTURE, BLOOD (ROUTINE X 2)     Status: None   Collection Time    06/24/14 11:49 AM      Result Value Ref Range Status   Specimen Description BLOOD RIGHT HAND   Final   Special Requests BOTTLES DRAWN AEROBIC ONLY 3ML   Final   Culture  Setup Time     Final   Value: 06/24/2014 14:06      Performed at [facility]   Culture     Final   Value:        BLOOD CULTURE RECEIVED NO GROWTH TO DATE CULTURE WILL BE HELD FOR 5 DAYS BEFORE ISSUING A FINAL NEGATIVE REPORT     Performed at [facility]   Report Status PENDING   Incomplete  URINE CULTURE     Status: None   Collection Time    06/24/14  1:47 PM      Result Value Ref Range Status   Specimen Description URINE, CATHETERIZED   Final   Special Requests NONE   Final   Culture  Setup Time     Final   Value: 06/24/2014 18:55     Performed at [facility]     Final   Value: NO GROWTH     Performed at [facility]   Culture     Final   Value: NO GROWTH     Performed at [facility]   Report Status 06/25/2014 FINAL   Final  MRSA PCR SCREENING     Status: None   Collection Time    06/24/14  3:35 PM      Result Value Ref Range Status   MRSA by PCR NEGATIVE  NEGATIVE Final   Comment:            The GeneXpert MRSA Assay (FDA     approved for NASAL specimens     only), is one component of a     comprehensive MRSA colonization     surveillance program. It is not     intended to diagnose MRSA     infection nor to guide or     monitor treatment for     MRSA infections.     Studies: Dg Chest Port 1 View  06/24/2014   CLINICAL DATA:  Hypoxia.  Hypotension.  Short of breath.  EXAM: PORTABLE CHEST - 1 VIEW  COMPARISON:  05/27/2014  FINDINGS: Diffuse bilateral airspace disease is worse throughout the right and left lungs. Stable dual lead right subclavian pacemaker and leads which are intact. No pneumothorax. Mild cardiomegaly.  IMPRESSION: Worsening diffuse bilateral airspace disease.   Electronically Signed   By: Maryclare Bean M.D.   On: 06/24/2014 11:38    Scheduled Meds: . guaiFENesin  1,200 mg Oral BID  . heparin subcutaneous  5,000 Units Subcutaneous 3 times per day  . insulin aspart  0-9 Units Subcutaneous TID WC  . pantoprazole  40 mg Oral Daily  .  piperacillin-tazobactam (ZOSYN)  IV  2.25 g Intravenous 4 times per day  . sodium chloride  3 mL Intravenous Q12H  . vancomycin  500 mg Intravenous Q48H   Continuous Infusions: . sodium chloride 75 mL/hr at 06/26/14 0100       Time spent: 35 minutes    [facility] A  [facility] Pager [phone] If 7PM-7AM, please contact night-coverage at [email or web address], password [facility] 06/26/2014, 9:48 AM  LOS: 2 days

## 2014-06-27 LAB — CBC
HCT: 28.5 % — ABNORMAL LOW (ref 36.0–46.0)
Hemoglobin: 9.5 g/dL — ABNORMAL LOW (ref 12.0–15.0)
MCH: 32.3 pg (ref 26.0–34.0)
MCHC: 33.3 g/dL (ref 30.0–36.0)
MCV: 96.9 fL (ref 78.0–100.0)
PLATELETS: 171 10*3/uL (ref 150–400)
RBC: 2.94 MIL/uL — ABNORMAL LOW (ref 3.87–5.11)
RDW: 15.2 % (ref 11.5–15.5)
WBC: 9.7 10*3/uL (ref 4.0–10.5)

## 2014-06-27 LAB — GLUCOSE, CAPILLARY
Glucose-Capillary: 104 mg/dL — ABNORMAL HIGH (ref 70–99)
Glucose-Capillary: 130 mg/dL — ABNORMAL HIGH (ref 70–99)
Glucose-Capillary: 110 mg/dL — ABNORMAL HIGH (ref 70–99)
Glucose-Capillary: 165 mg/dL — ABNORMAL HIGH (ref 70–99)

## 2014-06-27 LAB — BASIC METABOLIC PANEL
Anion gap: 17 — ABNORMAL HIGH (ref 5–15)
BUN: 33 mg/dL — AB (ref 6–23)
CO2: 12 mEq/L — ABNORMAL LOW (ref 19–32)
CREATININE: 1.67 mg/dL — AB (ref 0.50–1.10)
Calcium: 8.5 mg/dL (ref 8.4–10.5)
Chloride: 107 mEq/L (ref 96–112)
GFR, EST AFRICAN AMERICAN: 31 mL/min — AB (ref >90)
GFR, EST NON AFRICAN AMERICAN: 26 mL/min — AB (ref >90)
Glucose, Bld: 102 mg/dL — ABNORMAL HIGH (ref 70–99)
POTASSIUM: 4.4 meq/L (ref 3.7–5.3)
Sodium: 136 mEq/L — ABNORMAL LOW (ref 137–147)

## 2014-06-27 MED ORDER — AMOXICILLIN-POT CLAVULANATE 875-125 MG PO TABS: 1.0000 | ORAL_TABLET | Freq: Two times a day (BID) | ORAL | Status: DC

## 2014-06-27 MED ORDER — AMOXICILLIN-POT CLAVULANATE 500-125 MG PO TABS
1.0000 | ORAL_TABLET | Freq: Two times a day (BID) | ORAL | Status: DC
  Administered 2014-06-27 – 2014-06-29 (×5): 500 mg via ORAL
  Filled 2014-06-27 (×6): qty 1

## 2014-06-27 MED ORDER — ASPIRIN 81 MG PO CHEW
81.0000 mg | CHEWABLE_TABLET | Freq: Every day | ORAL | Status: DC
  Administered 2014-06-27 – 2014-06-30 (×4): 81 mg via ORAL
  Filled 2014-06-27 (×4): qty 1

## 2014-06-27 MED ORDER — SODIUM CHLORIDE 0.9 % IV SOLN
INTRAVENOUS | Status: DC
  Administered 2014-06-27: 12:00:00 via INTRAVENOUS

## 2014-06-27 NOTE — Evaluation (Signed)
Physical Therapy Evaluation Patient Details Name: Sophia Oliver MRN: [id number] DOB: 03/12/1924 Today's Date: 06/27/2014   History of Present Illness  80 y.o. female with past medical history of breast cancer, chronic systolic CHF, DM, HTN, chronic low back pain, pacemaker admitted for severe sepsis.  Clinical Impression  Pt admitted with severe sepsis. Pt currently with functional limitations due to the deficits listed below (see PT Problem List).  Pt will benefit from skilled PT to increase their independence and safety with mobility to allow discharge to the venue listed below.  Pt ambulated short distance in hallway with RW however reports increased LE weakness and fatigue which limited distance then pt became diaphoretic upon return to bed (RN notified).  Pt reports rehab at SNF prior to admission however would like to d/c home from hospital and does not wish to return to SNF.     Follow Up Recommendations SNF;Supervision for mobility/OOB    Equipment Recommendations  None recommended by PT    Recommendations for Other Services       Precautions / Restrictions Precautions Precautions: Fall      Mobility  Bed Mobility Overal bed mobility: Needs Assistance Bed Mobility: Supine to Sit;Sit to Supine     Supine to sit: Supervision Sit to supine: Min assist   General bed mobility comments: assist for LEs onto bed  Transfers Overall transfer level: Needs assistance Equipment used: Rolling walker (2 wheeled) Transfers: Sit to/from Stand Sit to Stand: Min assist         General transfer comment: verbal cues for safe technique  Ambulation/Gait Ambulation/Gait assistance: Min assist Ambulation Distance (Feet): 60 Feet Assistive device: Rolling walker (2 wheeled) Gait Pattern/deviations: Step-through pattern;Decreased stride length Gait velocity: decr   General Gait Details: very unsteady gait, no LOB, pt reports increased weakness of LEs and required chair behind pt,  SpO2 94% room air, pt denied dizziness/lightheadness x3 when asked during ambulation, become diaphoretic upon returning pt to bed Investment banker, [employer] notified)  Stairs            Wheelchair Mobility    Modified Rankin (Stroke Patients Only)       Balance                                             Pertinent Vitals/Pain Pain Assessment: No/denies pain    Home Living Family/patient expects to be discharged to:: Private residence Living Arrangements: Spouse/significant other   Type of Home: House Home Access: Stairs to enter Entrance Stairs-Rails: None Entrance Stairs-Number of Steps: 1 Home Layout: Two level;1/2 bath on main level;Able to live on main level with bedroom/bathroom Home Equipment: Gilford Rile - 2 wheels;Bedside commode;Shower seat;Cane - single point      Prior Function Level of Independence: Needs assistance   Gait / Transfers Assistance Needed: has been ambulating with AD but has increased falls, needs assistance     Comments: at SNF prior to admission for rehab however was scheduled to d/c home per pt, pt would like to d/c home from hospital     Hand Dominance        Extremity/Trunk Assessment               Lower Extremity Assessment: Generalized weakness         Communication   Communication: No difficulties  Cognition Arousal/Alertness: Awake/alert Behavior During Therapy: WFL for tasks assessed/performed Overall Cognitive  Status: Within Functional Limits for tasks assessed                      General Comments      Exercises        Assessment/Plan    PT Assessment Patient needs continued PT services  PT Diagnosis Difficulty walking;Generalized weakness   PT Problem List Decreased strength;Decreased activity tolerance;Decreased mobility;Decreased balance  PT Treatment Interventions Gait training;DME instruction;Functional mobility training;Therapeutic activities;Therapeutic exercise;Patient/family education    PT Goals (Current goals can be found in the Care Plan section) Acute Rehab PT Goals Patient Stated Goal: return  home PT Goal Formulation: With patient Time For Goal Achievement: 07/04/14 Potential to Achieve Goals: Good    Frequency Min 3X/week   Barriers to discharge        Co-evaluation               End of Session   Activity Tolerance: Patient limited by fatigue Patient left: in bed;with call bell/phone within reach;with nursing/sitter in room Nurse Communication: Mobility status         Time: 0626-9485 PT Time Calculation (min): 18 min   Charges:   PT Evaluation $Initial PT Evaluation Tier I: 1 Procedure PT Treatments $Gait Training: 8-22 mins   PT G Codes:          Sophia Oliver,Sophia Oliver 06/27/2014, 2:12 PM Sophia Oliver, PT, DPT 06/27/2014 Pager: [phone]

## 2014-06-27 NOTE — Progress Notes (Signed)
[facility] PROGRESS NOTE   Sophia Oliver NIO:[id number] DOB: 06-13-1924 DOA: 06/24/2014 PCP: [name], MD  HPI/Subjective: Had some lightheadedness and dizziness this morning when she started to walk with PT. Likely she was orthostatic, IV fluids restarted.  Assessment/Plan: Principal Problem:   Severe sepsis Active Problems:   CKD (chronic kidney disease), stage III   UTI (lower urinary tract infection)   Chronic systolic CHF (congestive heart failure)   Type II or unspecified type diabetes mellitus without mention of complication, not stated as uncontrolled   Lytic bone lesions on xray   HCAP (healthcare-associated pneumonia)    Severe sepsis  -Patient presented with a respiratory rate of 21, WBC of 16.3, SBP of 55 and suspected infection.  -Patient has UTI and likely has healthcare associated pneumonia.  -Hypotension resolved after IV fluids hydration in the ED.  -Patient admitted to step down  -This is seems to be resolved, blood pressure is controlled, no active symptoms. -Got dizzy with ambulation, likely orthostasis, IV fluids restarted.  UTI  -Urinalysis consistent with UTI, patient started on Zosyn.  -Follow cultures and adjust antibiotics accordingly.   Healthcare associated pneumonia  -Patient presented with worsening infiltrates, unclear to me if it's secondary to pneumonia or atypical pulmonary edema.  -Patient denies any pleuritic chest pain or productive cough.  -Anyway patient started empirically on Zosyn and vancomycin.  -Follow patient closely, continue supportive management with antitussives, bronchodilators and oxygen.  -I will discontinue IV fluids, start on Augmentin.  Chronic systolic CHF  -Patient has LVEF of 15-20% measured on May of 2015.  -Patient does not have lower extremity edema.  -There is lung infiltrates unclear if it's been resolving pneumonia patches or atypical pulmonary edema.  -Patient given IV fluids in the ED because of  hypotension.  CKD stage 3-4  -Creatinine baseline is 1.6-2.0. Presented with creatinine of 2.1.  -Creatinine went down to 1.6.[name]   Atrial fibrillation  -Patient is on Lopressor for rate control and PRADAXA.  -Lopressor held because of low blood pressure, likely need to be started as soon as possible to avoid reflex tachycardia.  -Discontinue PRADAXA as patient is a high fall risk because of dizziness. -I'll start patient on aspirin.  Bony lytic lesions  -CT head from May of 2015 showing bony lytic lesion involving the temporal and parietal bones.  -At the time question of multiple myeloma, please note that patient has history of breast cancer.   Code Status: Full code Family Communication: Plan discussed with the patient. Disposition Plan: Remains inpatient   Consultants:  None  Procedures:  None  Antibiotics:  Zosyn and vanc   Objective: Filed Vitals:   06/27/14 1152  BP: 128/98  Pulse:   Temp:   Resp: 24    Intake/Output Summary (Last 24 hours) at 06/27/14 1156 Last data filed at 06/27/14 0900  Gross per 24 hour  Intake    730 ml  Output    520 ml  Net    210 ml   Filed Weights   06/25/14 0400 06/26/14 0400 06/26/14 1705  Weight: 51.6 kg (113 lb 12.1 oz) 54.7 kg (120 lb 9.5 oz) 47.074 kg (103 lb 12.5 oz)    Exam: General: Alert and awake, oriented x3, not in any acute distress. HEENT: anicteric sclera, pupils reactive to light and accommodation, EOMI CVS: S1-S2 clear, no murmur rubs or gallops Chest: clear to auscultation bilaterally, no wheezing, rales or rhonchi Abdomen: soft nontender, nondistended, normal bowel sounds, no organomegaly Extremities: no  cyanosis, clubbing or edema noted bilaterally Neuro: Cranial nerves II-XII intact, no focal neurological deficits  Data Reviewed: Basic Metabolic Panel:  Recent Labs Lab 06/24/14 1125 06/25/14 0340 06/26/14 0406 06/27/14 0445  NA 140 138 138 136*  K 4.3 4.7 4.3 4.4  CL 108 108 108 107  CO2   --  17* 13* 12*  GLUCOSE 124* 106* 92 102*  BUN 29* 37* 39* 33*  CREATININE 2.10* 2.13* 2.02* 1.67*  CALCIUM  --  7.9* 8.4 8.5   Liver Function Tests:  Recent Labs Lab 06/26/14 0406  AST 28  ALT 36*  ALKPHOS 58  BILITOT 0.5  PROT 5.3*  ALBUMIN 2.5*   No results found for this basename: LIPASE, AMYLASE,  in the last 168 hours No results found for this basename: AMMONIA,  in the last 168 hours CBC:  Recent Labs Lab 06/24/14 1113 06/24/14 1125 06/25/14 0340 06/26/14 0406 06/27/14 0445  WBC 16.3*  --  11.7* 10.3 9.7  NEUTROABS 15.6*  --   --   --   --   HGB 11.1* 11.9* 8.8* 9.6* 9.5*  HCT 33.2* 35.0* 26.7* 28.9* 28.5*  MCV 96.5  --  94.7 95.4 96.9  PLT 226  --  161 166 171   Cardiac Enzymes: No results found for this basename: CKTOTAL, CKMB, CKMBINDEX, TROPONINI,  in the last 168 hours BNP (last 3 results)  Recent Labs  12/06/13 1129 06/24/14 1113  PROBNP 200.0* 16090.0*   CBG:  Recent Labs Lab 06/26/14 0750 06/26/14 1235 06/26/14 1712 06/26/14 2133 06/27/14 0735  GLUCAP 93 96 107* 138* 104*    Micro Recent Results (from the past 240 hour(s))  CULTURE, BLOOD (ROUTINE X 2)     Status: None   Collection Time    06/24/14 11:13 AM      Result Value Ref Range Status   Specimen Description BLOOD RIGHT WRIST   Final   Special Requests BOTTLES DRAWN AEROBIC AND ANAEROBIC 6 CC EA   Final   Culture  Setup Time     Final   Value: 06/24/2014 14:06     Performed at [facility]   Culture     Final   Value:        BLOOD CULTURE RECEIVED NO GROWTH TO DATE CULTURE WILL BE HELD FOR 5 DAYS BEFORE ISSUING A FINAL NEGATIVE REPORT     Performed at [facility]   Report Status PENDING   Incomplete  CULTURE, BLOOD (ROUTINE X 2)     Status: None   Collection Time    06/24/14 11:49 AM      Result Value Ref Range Status   Specimen Description BLOOD RIGHT HAND   Final   Special Requests BOTTLES DRAWN AEROBIC ONLY 3ML   Final   Culture  Setup Time      Final   Value: 06/24/2014 14:06     Performed at [facility]   Culture     Final   Value:        BLOOD CULTURE RECEIVED NO GROWTH TO DATE CULTURE WILL BE HELD FOR 5 DAYS BEFORE ISSUING A FINAL NEGATIVE REPORT     Performed at [facility]   Report Status PENDING   Incomplete  URINE CULTURE     Status: None   Collection Time    06/24/14  1:47 PM      Result Value Ref Range Status   Specimen Description URINE, CATHETERIZED   Final   Special  Requests NONE   Final   Culture  Setup Time     Final   Value: 06/24/2014 18:55     Performed at [place]     Final   Value: NO GROWTH     Performed at [facility]   Culture     Final   Value: NO GROWTH     Performed at [facility]   Report Status 06/25/2014 FINAL   Final  MRSA PCR SCREENING     Status: None   Collection Time    06/24/14  3:35 PM      Result Value Ref Range Status   MRSA by PCR NEGATIVE  NEGATIVE Final   Comment:            The GeneXpert MRSA Assay (FDA     approved for NASAL specimens     only), is one component of a     comprehensive MRSA colonization     surveillance program. It is not     intended to diagnose MRSA     infection nor to guide or     monitor treatment for     MRSA infections.  URINE CULTURE     Status: None   Collection Time    06/24/14  6:21 PM      Result Value Ref Range Status   Specimen Description URINE, CATHETERIZED   Final   Special Requests NONE   Final   Culture  Setup Time     Final   Value: 06/25/2014 00:07     Performed at [facility]     Final   Value: NO GROWTH     Performed at [facility]   Culture     Final   Value: NO GROWTH     Performed at [facility]   Report Status 06/26/2014 FINAL   Final     Studies: No results found.  Scheduled Meds: . guaiFENesin  1,200 mg Oral BID  . heparin subcutaneous  5,000 Units Subcutaneous 3 times per day  . insulin aspart  0-9 Units  Subcutaneous TID WC  . pantoprazole  40 mg Oral Daily  . piperacillin-tazobactam (ZOSYN)  IV  2.25 g Intravenous 4 times per day  . sodium chloride  3 mL Intravenous Q12H  . vancomycin  500 mg Intravenous Q48H   Continuous Infusions: . sodium chloride 75 mL/hr at 06/27/14 1155       Time spent: 35 minutes    [facility] A  [facility] Pager [phone] If 7PM-7AM, please contact night-coverage at [email or web address], password [facility] 06/27/2014, 11:56 AM  LOS: 3 days

## 2014-06-27 NOTE — Progress Notes (Signed)
Pt ambulating in hallway with PT & became weak and unable to ambulate back to room.  Pt placed in chair & assist back to room and back to bed.  VS were stable. CBG 130.  Pt placed on O2 at 2lpm/[place] and Dr. Hartford Poli notified.  Orders were received to increase IV fluid rate and to continue to monitor pt. Stacey Drain

## 2014-06-27 NOTE — Progress Notes (Signed)
OT Cancellation Note  Patient Details Name: ATLANTIS DELONG MRN: [id number] DOB: January 11, 1924   Cancelled Treatment:    Reason Eval/Treat Not Completed: Fatigue/lethargy limiting ability to participate;Medical issues which prohibited therapy   Noted events with PT.  Will recheck on pt next day.  Betsy Pries 06/27/2014, 12:57 PM

## 2014-06-28 LAB — CBC
HCT: 28.1 % — ABNORMAL LOW (ref 36.0–46.0)
Hemoglobin: 9.1 g/dL — ABNORMAL LOW (ref 12.0–15.0)
MCH: 31.3 pg (ref 26.0–34.0)
MCHC: 32.4 g/dL (ref 30.0–36.0)
MCV: 96.6 fL (ref 78.0–100.0)
Platelets: 179 10*3/uL (ref 150–400)
RBC: 2.91 MIL/uL — AB (ref 3.87–5.11)
RDW: 15 % (ref 11.5–15.5)
WBC: 7.7 10*3/uL (ref 4.0–10.5)

## 2014-06-28 LAB — BASIC METABOLIC PANEL
Anion gap: 14 (ref 5–15)
BUN: 26 mg/dL — ABNORMAL HIGH (ref 6–23)
CO2: 13 mEq/L — ABNORMAL LOW (ref 19–32)
CREATININE: 1.52 mg/dL — AB (ref 0.50–1.10)
Calcium: 8.4 mg/dL (ref 8.4–10.5)
Chloride: 111 mEq/L (ref 96–112)
GFR calc Af Amer: 34 mL/min — ABNORMAL LOW (ref >90)
GFR, EST NON AFRICAN AMERICAN: 30 mL/min — AB (ref >90)
GLUCOSE: 123 mg/dL — AB (ref 70–99)
POTASSIUM: 4.2 meq/L (ref 3.7–5.3)
Sodium: 138 mEq/L (ref 137–147)

## 2014-06-28 LAB — GLUCOSE, CAPILLARY
GLUCOSE-CAPILLARY: 101 mg/dL — AB (ref 70–99)
GLUCOSE-CAPILLARY: 126 mg/dL — AB (ref 70–99)
Glucose-Capillary: 118 mg/dL — ABNORMAL HIGH (ref 70–99)

## 2014-06-28 MED ORDER — METOPROLOL TARTRATE 50 MG PO TABS
50.0000 mg | ORAL_TABLET | Freq: Once | ORAL | Status: AC
  Administered 2014-06-28: 50 mg via ORAL
  Filled 2014-06-28: qty 1

## 2014-06-28 NOTE — Progress Notes (Signed)
Clinical Social Work Department BRIEF PSYCHOSOCIAL ASSESSMENT 06/28/2014  Patient:  Sophia Oliver, HOLECEK     Account Number:  [id number]     Admit date:  06/24/2014  Clinical Social Worker:  Renold Genta  Date/Time:  06/28/2014 02:08 PM  Referred by:  Physician  Date Referred:  06/28/2014 Referred for  SNF Placement   Other Referral:   Interview type:  Patient Other interview type:   and husband, Robert at bedside    PSYCHOSOCIAL DATA Living Status:  [place] Admitted from facility:   Level of care:   Primary support name:  Laranda Burkemper (husband) h#: [phone] c#: [phone] Primary support relationship to patient:  SPOUSE Degree of support available:   good    CURRENT CONCERNS Current Concerns  Post-Acute Placement   Other Concerns:    SOCIAL WORK ASSESSMENT / PLAN CSW received consult for SNF placement.   Assessment/plan status:  Information/Referral to [employer] Other assessment/ plan:   Information/referral to community resources:   CSW completed FL2 and faxed information out to [facility] - provided bed offers.    PATIENT'S/FAMILY'S RESPONSE TO PLAN OF CARE: Patient informed CSW that she had discharged home from [facility] on 8/20 and readmitted to [place] 8/21. Patient originally declined SNF placement stating she'd prefer to return home but she's agreeable now and prefers to return to [facility]. CSW confirmed with [facility] that they would be able to take patient back - anticipating possible discharge tomorrow.       Raynaldo Opitz, [facility] Clinical Social Worker cell #: [phone]

## 2014-06-28 NOTE — Progress Notes (Signed)
Central Tele alerted that pt was in [facility]. Upon review of the monitor, patient was not in [facility] but Atrial Fib with a wide QRS and occasional pacing spikes. [facility] who stated it appears that the patient's pacer is not pacing or sensing. HR is AFib in the 90-100s. Paged hospitalist who reviewed patient's most recent VS and med rec. 50 mg PO Lopressor ordered. Will monitor patient's HR and BP.

## 2014-06-28 NOTE — Progress Notes (Signed)
[facility] PROGRESS NOTE   Sophia Oliver ENM:[id number] DOB: Aug 30, 1924 DOA: 06/24/2014 PCP: [name], MD  HPI/Subjective: Feels much better today, no lightheadedness or dizziness. Initially patient wanted to go home, after she worked today with PT/OT I think she realized her limitations and agree to go to SNF.   80 year old female with past medical history of CKD, chronic systolic CHF and type 2 diabetes came in to the hospital because of cough and nausea. Patient was recently in the hospital and discharged to nursing home where she stayed for about 3 weeks, she was discharged home she stayed only one night and then she presented to the hospital with the above complaints. Initially admitted to the ICU because of low blood pressure, she was on broad-spectrum antibiotics, her urine culture showed no growth, her chest x-ray was not convincing for pneumonia and she did not have any symptoms clinically., Antibiotics de-escalated to Augmentin. Patient for discharge in a.m. to SNF  Assessment/Plan: Principal Problem:   Severe sepsis Active Problems:   CKD (chronic kidney disease), stage III   UTI (lower urinary tract infection)   Chronic systolic CHF (congestive heart failure)   Type II or unspecified type diabetes mellitus without mention of complication, not stated as uncontrolled   Lytic bone lesions on xray   HCAP (healthcare-associated pneumonia)    Severe sepsis  -Patient presented with a respiratory rate of 21, WBC of 16.3, SBP of 55 and suspected infection.  -Patient has UTI and likely has healthcare associated pneumonia.  -Hypotension resolved after IV fluids hydration in the ED.  -Patient admitted initially to step down  -This is seems to be resolved, blood pressure is controlled, no active symptoms. -Got dizzy with ambulation, likely orthostasis, given IV fluids overnight, feels much better today.  UTI  -Urinalysis consistent with UTI, patient started on Zosyn.    -Follow cultures and adjust antibiotics accordingly.   Healthcare associated pneumonia  -Patient presented with worsening infiltrates, unclear to me if it's secondary to pneumonia or atypical pulmonary edema.  -Patient denies any pleuritic chest pain or productive cough.  -Anyway patient started empirically on Zosyn and vancomycin.  -Follow patient closely, continue supportive management with antitussives, bronchodilators and oxygen.  -I will discontinue IV fluids/antibiotics, started on Augmentin.  Chronic systolic CHF  -Patient has LVEF of 15-20% measured on May of 2015.  -Patient does not have lower extremity edema.  -There is lung infiltrates unclear if it's been resolving pneumonia patches or atypical pulmonary edema.  -Patient given IV fluids in the ED because of hypotension.  CKD stage 3-4  -Creatinine baseline is 1.6-2.0. Presented with creatinine of 2.1.  -Creatinine went down to 1.6.[name]   Atrial fibrillation  -Patient is on Lopressor for rate control and PRADAXA.  -Lopressor held because of low blood pressure, likely need to be started as soon as possible to avoid reflex tachycardia.  -Discontinue PRADAXA as patient is a high fall risk because of dizziness. -I'll start patient on aspirin.  Bony lytic lesions  -CT head from May of 2015 showing bony lytic lesion involving the temporal and parietal bones.  -At the time question of multiple myeloma, please note that patient has history of breast cancer. -Patient to followup with her primary oncologist as outpatient.   Code Status: Full code Family Communication: Plan discussed with the patient. Disposition Plan: Remains inpatient   Consultants:  None  Procedures:  None  Antibiotics:  Zosyn and vanc   Objective: Filed Vitals:   06/28/14 0315  BP: 127/60  Pulse: 66  Temp: 98 F (36.7 C)  Resp: 20    Intake/Output Summary (Last 24 hours) at 06/28/14 1629 Last data filed at 06/28/14 1300  Gross per 24  hour  Intake 1688.75 ml  Output    450 ml  Net 1238.75 ml   Filed Weights   06/25/14 0400 06/26/14 0400 06/26/14 1705  Weight: 51.6 kg (113 lb 12.1 oz) 54.7 kg (120 lb 9.5 oz) 47.074 kg (103 lb 12.5 oz)    Exam: General: Alert and awake, oriented x3, not in any acute distress. HEENT: anicteric sclera, pupils reactive to light and accommodation, EOMI CVS: S1-S2 clear, no murmur rubs or gallops Chest: clear to auscultation bilaterally, no wheezing, rales or rhonchi Abdomen: soft nontender, nondistended, normal bowel sounds, no organomegaly Extremities: no cyanosis, clubbing or edema noted bilaterally Neuro: Cranial nerves II-XII intact, no focal neurological deficits  Data Reviewed: Basic Metabolic Panel:  Recent Labs Lab 06/24/14 1125 06/25/14 0340 06/26/14 0406 06/27/14 0445 06/28/14 0504  NA 140 138 138 136* 138  K 4.3 4.7 4.3 4.4 4.2  CL 108 108 108 107 111  CO2  --  17* 13* 12* 13*  GLUCOSE 124* 106* 92 102* 123*  BUN 29* 37* 39* 33* 26*  CREATININE 2.10* 2.13* 2.02* 1.67* 1.52*  CALCIUM  --  7.9* 8.4 8.5 8.4   Liver Function Tests:  Recent Labs Lab 06/26/14 0406  AST 28  ALT 36*  ALKPHOS 58  BILITOT 0.5  PROT 5.3*  ALBUMIN 2.5*   No results found for this basename: LIPASE, AMYLASE,  in the last 168 hours No results found for this basename: AMMONIA,  in the last 168 hours CBC:  Recent Labs Lab 06/24/14 1113 06/24/14 1125 06/25/14 0340 06/26/14 0406 06/27/14 0445 06/28/14 0504  WBC 16.3*  --  11.7* 10.3 9.7 7.7  NEUTROABS 15.6*  --   --   --   --   --   HGB 11.1* 11.9* 8.8* 9.6* 9.5* 9.1*  HCT 33.2* 35.0* 26.7* 28.9* 28.5* 28.1*  MCV 96.5  --  94.7 95.4 96.9 96.6  PLT 226  --  161 166 171 179   Cardiac Enzymes: No results found for this basename: CKTOTAL, CKMB, CKMBINDEX, TROPONINI,  in the last 168 hours BNP (last 3 results)  Recent Labs  12/06/13 1129 06/24/14 1113  PROBNP 200.0* 16090.0*   CBG:  Recent Labs Lab 06/27/14 1146  06/27/14 1702 06/27/14 2139 06/28/14 0729 06/28/14 1236  GLUCAP 130* 110* 165* 118* 101*    Micro Recent Results (from the past 240 hour(s))  CULTURE, BLOOD (ROUTINE X 2)     Status: None   Collection Time    06/24/14 11:13 AM      Result Value Ref Range Status   Specimen Description BLOOD RIGHT WRIST   Final   Special Requests BOTTLES DRAWN AEROBIC AND ANAEROBIC 6 CC EA   Final   Culture  Setup Time     Final   Value: 06/24/2014 14:06     Performed at [facility]   Culture     Final   Value:        BLOOD CULTURE RECEIVED NO GROWTH TO DATE CULTURE WILL BE HELD FOR 5 DAYS BEFORE ISSUING A FINAL NEGATIVE REPORT     Performed at [facility]   Report Status PENDING   Incomplete  CULTURE, BLOOD (ROUTINE X 2)     Status: None   Collection Time  06/24/14 11:49 AM      Result Value Ref Range Status   Specimen Description BLOOD RIGHT HAND   Final   Special Requests BOTTLES DRAWN AEROBIC ONLY 3ML   Final   Culture  Setup Time     Final   Value: 06/24/2014 14:06     Performed at [facility]   Culture     Final   Value:        BLOOD CULTURE RECEIVED NO GROWTH TO DATE CULTURE WILL BE HELD FOR 5 DAYS BEFORE ISSUING A FINAL NEGATIVE REPORT     Performed at [facility]   Report Status PENDING   Incomplete  URINE CULTURE     Status: None   Collection Time    06/24/14  1:47 PM      Result Value Ref Range Status   Specimen Description URINE, CATHETERIZED   Final   Special Requests NONE   Final   Culture  Setup Time     Final   Value: 06/24/2014 18:55     Performed at [place]     Final   Value: NO GROWTH     Performed at [facility]   Culture     Final   Value: NO GROWTH     Performed at [facility]   Report Status 06/25/2014 FINAL   Final  MRSA PCR SCREENING     Status: None   Collection Time    06/24/14  3:35 PM      Result Value Ref Range Status   MRSA by PCR NEGATIVE  NEGATIVE Final   Comment:             The GeneXpert MRSA Assay (FDA     approved for NASAL specimens     only), is one component of a     comprehensive MRSA colonization     surveillance program. It is not     intended to diagnose MRSA     infection nor to guide or     monitor treatment for     MRSA infections.  URINE CULTURE     Status: None   Collection Time    06/24/14  6:21 PM      Result Value Ref Range Status   Specimen Description URINE, CATHETERIZED   Final   Special Requests NONE   Final   Culture  Setup Time     Final   Value: 06/25/2014 00:07     Performed at [place]     Final   Value: NO GROWTH     Performed at [facility]   Culture     Final   Value: NO GROWTH     Performed at [facility]   Report Status 06/26/2014 FINAL   Final     Studies: No results found.  Scheduled Meds: . amoxicillin-clavulanate  1 tablet Oral BID  . aspirin  81 mg Oral Daily  . guaiFENesin  1,200 mg Oral BID  . heparin subcutaneous  5,000 Units Subcutaneous 3 times per day  . insulin aspart  0-9 Units Subcutaneous TID WC  . pantoprazole  40 mg Oral Daily  . sodium chloride  3 mL Intravenous Q12H   Continuous Infusions:       Time spent: 35 minutes    [facility] A  [facility] Pager [phone] If 7PM-7AM, please contact night-coverage at [email or web address], password [email or web address] 06/28/2014, 4:29 PM  LOS: 4 days

## 2014-06-28 NOTE — Evaluation (Signed)
Occupational Therapy Evaluation Patient Details Name: Sophia Oliver MRN: [id number] DOB: 1924/05/26 Today's Date: 06/28/2014    History of Present Illness 80 y.o. female with past medical history of breast cancer, chronic systolic CHF, DM, HTN, chronic low back pain, pacemaker admitted for severe sepsis.   Clinical Impression   Pt was admitted for the above.  She recently returned home from SNF stay for rehab and needed only occasional min A.  Pt would benefit from skilled OT to increase safety and independence with ADLs.  Goals in acute are for overall min A:  Pt currently needs max A for LB adls and min for UB.    Follow Up Recommendations  SNF   Pt really wants to go home but feel she will need STSNF    Equipment Recommendations  None recommended by OT    Recommendations for Other Services       Precautions / Restrictions Precautions Precautions: Fall Restrictions Weight Bearing Restrictions: No      Mobility Bed Mobility         Supine to sit: Min assist     General bed mobility comments: assist for trunk  Transfers     Transfers: Sit to/from Stand Sit to Stand: Min assist         General transfer comment: verbal cues for safe technique    Balance                                            ADL Overall ADL's : Needs assistance/impaired     Grooming: Set up;Sitting   Upper Body Bathing: Minimal assitance;Sitting   Lower Body Bathing: Maximal assistance;Sit to/from stand   Upper Body Dressing : Minimal assistance;Sitting   Lower Body Dressing: Maximal assistance;Sit to/from stand   Toilet Transfer: Minimal assistance;RW;Stand-pivot (to recliner)   Toileting- Clothing Manipulation and Hygiene: Maximal assistance;Bed level         General ADL Comments: pt has been having diarrhea.  Assisted with clean up at bed level. Completed ADL from sit to stand:  Pt has decreased endurance for ADLs. Has been nauseas.  Pt had just  completed rehab at [facility].       Vision                     Perception     Praxis      Pertinent Vitals/Pain Pain Assessment: No/denies pain     Hand Dominance     Extremity/Trunk Assessment Upper Extremity Assessment Upper Extremity Assessment: RUE deficits/detail;LUE deficits/detail RUE Deficits / Details: h/o bil shoulder surgeries.  Can lift R shoulder to 90 and L to 80           Communication Communication Communication: No difficulties   Cognition Arousal/Alertness: Awake/alert Behavior During Therapy: WFL for tasks assessed/performed Overall Cognitive Status: Within Functional Limits for tasks assessed                     General Comments       Exercises       Shoulder Instructions      Home Living Family/patient expects to be discharged to:: Private residence Living Arrangements: Spouse/significant other Available Help at Discharge: Available 24 hours/day;Family Type of Home: House Home Access: Stairs to enter [employer] of Steps: 1 Entrance Stairs-Rails: None Home Layout: Two level;1/2 bath on main level;Able to live  on main level with bedroom/bathroom     Bathroom Shower/Tub: Occupational psychologist: Standard     Home Equipment: Environmental consultant - 2 wheels;Bedside commode;Shower seat;Cane - single point          Prior Functioning/Environment Level of Independence: Needs assistance  Gait / Transfers Assistance Needed: has been ambulating with AD but has increased falls, needs assistance     Comments: at SNF prior to admission for rehab however was scheduled to d/c home per pt, pt would like to d/c home from hospital    OT Diagnosis: Generalized weakness   OT Problem List: Decreased strength;Decreased activity tolerance;Impaired balance (sitting and/or standing)   OT Treatment/Interventions: Self-care/ADL training;DME and/or AE instruction;Patient/family education;Therapeutic activities;Energy conservation     OT Goals(Current goals can be found in the care plan section) Acute Rehab OT Goals Patient Stated Goal: return  home OT Goal Formulation: With patient Time For Goal Achievement: 07/12/14 Potential to Achieve Goals: Good ADL Goals Pt Will Transfer to Toilet: with min guard assist;ambulating;bedside commode Pt Will Perform Toileting - Clothing Manipulation and hygiene: with min guard assist;sit to/from stand Additional ADL Goal #1: pt will complete UB adls with set up with rest breaks for energy consevation Additional ADL Goal #2: pt will complete LB adls with min A, AE as needed, with rest breaks:  sit to stand  OT Frequency: Min 2X/week   Barriers to D/C:            Co-evaluation              End of Session Nurse Communication: Mobility status (diarrhea)  Activity Tolerance: Patient limited by fatigue Patient left: in chair;with call bell/phone within reach;with chair alarm set   Time: 8416-6063 OT Time Calculation (min): 31 min Charges:  OT General Charges $OT Visit: 1 Procedure OT Evaluation $Initial OT Evaluation Tier I: 1 Procedure OT Treatments $Self Care/Home Management : 23-37 mins G-Codes:    Boden Stucky 2014-07-09, 9:03 AM Lesle Chris, OTR/L [phone] 07-09-2014

## 2014-06-28 NOTE — Progress Notes (Signed)
Clinical Social Work Department CLINICAL SOCIAL WORK PLACEMENT NOTE 06/28/2014  Patient:  TAKAYA, HYSLOP  Account Number:  [id number] Admit date:  06/24/2014  Clinical Social Worker:  Renold Genta  Date/time:  06/28/2014 02:13 PM  Clinical Social Work is seeking post-discharge placement for this patient at the following level of care:   SKILLED NURSING   (*CSW will update this form in Epic as items are completed)   06/28/2014  Patient/family provided with [place] Department of Clinical Social Work's list of facilities offering this level of care within the geographic area requested by the patient (or if unable, by the patient's family).  06/28/2014  Patient/family informed of their freedom to choose among providers that offer the needed level of care, that participate in Medicare, Medicaid or managed care program needed by the patient, have an available bed and are willing to accept the patient.  06/28/2014  Patient/family informed of [facility]' ownership interest in [facility], as well as of the fact that they are under no obligation to receive care at this facility.  PASARR submitted to EDS on 06/28/2014 PASARR number received on 06/28/2014  FL2 transmitted to all facilities in geographic area requested by pt/family on  06/28/2014 FL2 transmitted to all facilities within larger geographic area on   Patient informed that his/her managed care company has contracts with or will negotiate with  certain facilities, including the following:     Patient/family informed of bed offers received:  06/28/2014 Patient chooses bed at [place] Physician recommends and patient chooses bed at    Patient to be transferred to [place] on   Patient to be transferred to facility by  Patient and family notified of transfer on  Name of family member notified:    The following physician request were entered in Epic:   Additional Comments:   Raynaldo Opitz,  [place] Social Worker cell #: [phone]

## 2014-06-28 NOTE — Progress Notes (Signed)
Assumed care of pt. No change in initial am assessment at this time. Cont with plan of care

## 2014-06-29 ENCOUNTER — Inpatient Hospital Stay (HOSPITAL_COMMUNITY): Payer: Medicare Other

## 2014-06-29 LAB — GLUCOSE, CAPILLARY
GLUCOSE-CAPILLARY: 132 mg/dL — AB (ref 70–99)
GLUCOSE-CAPILLARY: 126 mg/dL — AB (ref 70–99)
Glucose-Capillary: 148 mg/dL — ABNORMAL HIGH (ref 70–99)
Glucose-Capillary: 83 mg/dL (ref 70–99)
Glucose-Capillary: 106 mg/dL — ABNORMAL HIGH (ref 70–99)

## 2014-06-29 LAB — BASIC METABOLIC PANEL
ANION GAP: 16 — AB (ref 5–15)
BUN: 26 mg/dL — ABNORMAL HIGH (ref 6–23)
CO2: 13 meq/L — AB (ref 19–32)
Calcium: 9 mg/dL (ref 8.4–10.5)
Chloride: 109 mEq/L (ref 96–112)
Creatinine, Ser: 1.52 mg/dL — ABNORMAL HIGH (ref 0.50–1.10)
GFR calc Af Amer: 34 mL/min — ABNORMAL LOW (ref >90)
GFR calc non Af Amer: 30 mL/min — ABNORMAL LOW (ref >90)
GLUCOSE: 126 mg/dL — AB (ref 70–99)
Potassium: 4.9 mEq/L (ref 3.7–5.3)
SODIUM: 138 meq/L (ref 137–147)

## 2014-06-29 MED ORDER — LEVOFLOXACIN 500 MG PO TABS
500.0000 mg | ORAL_TABLET | Freq: Every day | ORAL | Status: DC
  Administered 2014-06-29 – 2014-06-30 (×2): 500 mg via ORAL
  Filled 2014-06-29 (×2): qty 1

## 2014-06-29 MED ORDER — ZOLPIDEM TARTRATE 5 MG PO TABS
5.0000 mg | ORAL_TABLET | Freq: Once | ORAL | Status: AC
  Administered 2014-06-29: 5 mg via ORAL
  Filled 2014-06-29: qty 1

## 2014-06-29 MED ORDER — METOPROLOL TARTRATE 12.5 MG HALF TABLET
12.5000 mg | ORAL_TABLET | Freq: Two times a day (BID) | ORAL | Status: DC
  Administered 2014-06-29 – 2014-06-30 (×2): 12.5 mg via ORAL
  Filled 2014-06-29 (×3): qty 1

## 2014-06-29 NOTE — Progress Notes (Signed)
PROGRESS NOTE  Sophia Oliver PPI:[id number] DOB: [birth year] DOA: 06/24/2014 PCP: [name], MD  HPI: 80 year old female with past medical history of CKD, chronic systolic CHF and type 2 diabetes came in to the hospital because of cough and nausea. Patient was recently in the hospital and discharged to nursing home where she stayed for about 3 weeks, she was discharged home she stayed only one night and then she presented to the hospital with the above complaints. Initially admitted to the ICU because of low blood pressure, she was on broad-spectrum antibiotics, her urine culture showed no growth, her chest x-ray was not convincing for pneumonia and she did not have any symptoms clinically, Antibiotics de-escalated to Levofloxacin  Subjective/ 24 H Interval events - complains of shortness of breath this morning  Assessment/Plan: Severe sepsis  - Patient presented with a respiratory rate of 21, WBC of 16.3, SBP of 55 and suspected infection.  - Patient has likely has healthcare associated pneumonia.  - Hypotension resolved after IV fluids hydration in the ED.  - Patient admitted initially to step down now stable on the floor - This is seems to be resolved, blood pressure is controlled, no active symptoms.  UTI  - Urinalysis consistent with UTI, however cultures negative as of 8/22. Has history of ESBL. Healthcare associated pneumonia  - Patient presented with worsening infiltrates, unclear to me if it's secondary to pneumonia or atypical pulmonary edema.  - more dyspneic and tachypnic this morning, repeat CXR with likely HCAP. Switch to levofloxacin today.  - Patient denies any pleuritic chest pain or productive cough.  Chronic systolic CHF  - Patient has LVEF of 15-20% measured on May of 2015.  - Patient does not have lower extremity edema.  - There is lung infiltrates more likely due to HCAP CKD stage 3-4  - Creatinine baseline is 1.6-2.0. Presented with creatinine of 2.1.  -  Creatinine went down to 1.6 and now stable  Atrial fibrillation  - Patient is on Lopressor for rate control and PRADAXA.  - Lopressor held because of low blood pressure initially, restarted at lower dose on 8/26 - Dr. [place] Poli discontinued Pradaxa and started patient on aspirin given high fall risk Bony lytic lesions  - CT head from May of 2015 showing bony lytic lesion involving the temporal and parietal bones.  - At the time question of multiple myeloma, please note that patient has history of breast cancer.  - Patient to followup with her primary oncologist as outpatient.  Diet: regular Fluids: none DVT Prophylaxis: heoarin  Code Status: Full Family Communication: d/w husband  Disposition Plan: inpatient  Consultants:  None   Procedures:  None    Antibiotics Levofloxacin 8/26 >>  Studies  Filed Vitals:   06/28/14 1347 06/28/14 2034 06/29/14 0506 06/29/14 1354  BP: 127/60 122/59 133/63 123/51  Pulse: 66 64 65 68  Temp: 98 F (36.7 C) 98 F (36.7 C) 97.3 F (36.3 C) 98.1 F (36.7 C)  TempSrc: Oral Oral Oral Oral  Resp: _0 Height:      Weight:      SpO2: 95% 98% 97% 100%    Intake/Output Summary (Last 24 hours) at 06/29/14 1535 Last data filed at 06/29/14 1000  Gross per 24 hour  Intake    270 ml  Output    100 ml  Net    170 ml   Filed Weights   06/25/14 0400 06/26/14 0400 06/26/14 1705  Weight: 51.6 kg (113  lb 12.1 oz) 54.7 kg (120 lb 9.5 oz) 47.074 kg (103 lb 12.5 oz)   Exam:  General:  In mild distress due to elevated respiratory rate  Cardiovascular: RRR  Respiratory: mostly clear, no wheezing  Abdomen: soft, non tender  MSK: no edema  Data Reviewed: Basic Metabolic Panel:  Recent Labs Lab 06/25/14 0340 06/26/14 0406 06/27/14 0445 06/28/14 0504 06/29/14 0451  NA 138 138 136* 138 138  K 4.7 4.3 4.4 4.2 4.9  CL 108 108 107 111 109  CO2 17* 13* 12* 13* 13*  GLUCOSE 106* 92 102* 123* 126*  BUN 37* 39* 33* 26* 26*    CREATININE 2.13* 2.02* 1.67* 1.52* 1.52*  CALCIUM 7.9* 8.4 8.5 8.4 9.0   Liver Function Tests:  Recent Labs Lab 06/26/14 0406  AST 28  ALT 36*  ALKPHOS 58  BILITOT 0.5  PROT 5.3*  ALBUMIN 2.5*   No results found for this basename: LIPASE, AMYLASE,  in the last 168 hours No results found for this basename: AMMONIA,  in the last 168 hours CBC:  Recent Labs Lab 06/24/14 1113 06/24/14 1125 06/25/14 0340 06/26/14 0406 06/27/14 0445 06/28/14 0504  WBC 16.3*  --  11.7* 10.3 9.7 7.7  NEUTROABS 15.6*  --   --   --   --   --   HGB 11.1* 11.9* 8.8* 9.6* 9.5* 9.1*  HCT 33.2* 35.0* 26.7* 28.9* 28.5* 28.1*  MCV 96.5  --  94.7 95.4 96.9 96.6  PLT 226  --  161 166 171 179   Cardiac Enzymes: No results found for this basename: CKTOTAL, CKMB, CKMBINDEX, TROPONINI,  in the last 168 hours BNP (last 3 results)  Recent Labs  12/06/13 1129 06/24/14 1113  PROBNP 200.0* 16090.0*   CBG:  Recent Labs Lab 06/28/14 1236 06/28/14 1732 06/28/14 2226 06/29/14 0727 06/29/14 1223  GLUCAP 101* 126* 148* 132* 126*    Recent Results (from the past 240 hour(s))  CULTURE, BLOOD (ROUTINE X 2)     Status: None   Collection Time    06/24/14 11:13 AM      Result Value Ref Range Status   Specimen Description BLOOD RIGHT WRIST   Final   Special Requests BOTTLES DRAWN AEROBIC AND ANAEROBIC 6 CC EA   Final   Culture  Setup Time     Final   Value: 06/24/2014 14:06     Performed at [facility]   Culture     Final   Value:        BLOOD CULTURE RECEIVED NO GROWTH TO DATE CULTURE WILL BE HELD FOR 5 DAYS BEFORE ISSUING A FINAL NEGATIVE REPORT     Performed at [facility]   Report Status PENDING   Incomplete  CULTURE, BLOOD (ROUTINE X 2)     Status: None   Collection Time    06/24/14 11:49 AM      Result Value Ref Range Status   Specimen Description BLOOD RIGHT HAND   Final   Special Requests BOTTLES DRAWN AEROBIC ONLY 3ML   Final   Culture  Setup Time     Final   Value:  06/24/2014 14:06     Performed at [facility]   Culture     Final   Value:        BLOOD CULTURE RECEIVED NO GROWTH TO DATE CULTURE WILL BE HELD FOR 5 DAYS BEFORE ISSUING A FINAL NEGATIVE REPORT     Performed at [facility]  Report Status PENDING   Incomplete  URINE CULTURE     Status: None   Collection Time    06/24/14  1:47 PM      Result Value Ref Range Status   Specimen Description URINE, CATHETERIZED   Final   Special Requests NONE   Final   Culture  Setup Time     Final   Value: 06/24/2014 18:55     Performed at [place]     Final   Value: NO GROWTH     Performed at [facility]   Culture     Final   Value: NO GROWTH     Performed at [facility]   Report Status 06/25/2014 FINAL   Final  MRSA PCR SCREENING     Status: None   Collection Time    06/24/14  3:35 PM      Result Value Ref Range Status   MRSA by PCR NEGATIVE  NEGATIVE Final   Comment:            The GeneXpert MRSA Assay (FDA     approved for NASAL specimens     only), is one component of a     comprehensive MRSA colonization     surveillance program. It is not     intended to diagnose MRSA     infection nor to guide or     monitor treatment for     MRSA infections.  URINE CULTURE     Status: None   Collection Time    06/24/14  6:21 PM      Result Value Ref Range Status   Specimen Description URINE, CATHETERIZED   Final   Special Requests NONE   Final   Culture  Setup Time     Final   Value: 06/25/2014 00:07     Performed at [facility]     Final   Value: NO GROWTH     Performed at [facility]   Culture     Final   Value: NO GROWTH     Performed at [facility]   Report Status 06/26/2014 FINAL   Final     Studies: No results found.  Scheduled Meds: . aspirin  81 mg Oral Daily  . guaiFENesin  1,200 mg Oral BID  . heparin subcutaneous  5,000 Units Subcutaneous 3 times per day  . insulin  aspart  0-9 Units Subcutaneous TID WC  . levofloxacin  500 mg Oral Daily  . pantoprazole  40 mg Oral Daily  . sodium chloride  3 mL Intravenous Q12H   Continuous Infusions:   Principal Problem:   Severe sepsis Active Problems:   CKD (chronic kidney disease), stage III   UTI (lower urinary tract infection)   Chronic systolic CHF (congestive heart failure)   Type II or unspecified type diabetes mellitus without mention of complication, not stated as uncontrolled   Lytic bone lesions on xray   HCAP (healthcare-associated pneumonia)   Time spent: 35  This note has been created with Surveyor, quantity. Any transcriptional errors are unintentional.   Marzetta Board, MD [facility] Pager [phone]. If 7 PM - 7 AM, please contact night-coverage at [email or web address], password [facility] 06/29/2014, 3:35 PM  LOS: 5 days

## 2014-06-29 NOTE — Progress Notes (Signed)
Patient is set to discharge to [facility] today. Patient & husband, Sophia Oliver aware. Discharge packet in [place], Manuela Schwartz aware. [facility] called for transport.   Clinical Social Work Department CLINICAL SOCIAL WORK PLACEMENT NOTE 06/29/2014  Patient:  Sophia Oliver, Sophia Oliver  Account Number:  [id number] Admit date:  06/24/2014  Clinical Social Worker:  Renold Genta  Date/time:  06/28/2014 02:13 PM  Clinical Social Work is seeking post-discharge placement for this patient at the following level of care:   SKILLED NURSING   (*CSW will update this form in Epic as items are completed)   06/28/2014  Patient/family provided with [place] Department of Clinical Social Work's list of facilities offering this level of care within the geographic area requested by the patient (or if unable, by the patient's family).  06/28/2014  Patient/family informed of their freedom to choose among providers that offer the needed level of care, that participate in Medicare, Medicaid or managed care program needed by the patient, have an available bed and are willing to accept the patient.  06/28/2014  Patient/family informed of [facility]' ownership interest in [facility], as well as of the fact that they are under no obligation to receive care at this facility.  PASARR submitted to EDS on 06/28/2014 PASARR number received on 06/28/2014  FL2 transmitted to all facilities in geographic area requested by pt/family on  06/28/2014 FL2 transmitted to all facilities within larger geographic area on   Patient informed that his/her managed care company has contracts with or will negotiate with  certain facilities, including the following:     Patient/family informed of bed offers received:  06/28/2014 Patient chooses bed at [place] Physician recommends and patient chooses bed at    Patient to be transferred to [place] on  06/30/2014 Patient to be transferred to facility by [facility] Patient and  family notified of transfer on 06/30/2014 Name of family member notified:  patient's husband, Sophia Oliver at bedside  The following physician request were entered in Epic:   Additional Comments:   Raynaldo Opitz, [place] Social Worker cell #: [phone]

## 2014-06-29 NOTE — Progress Notes (Signed)
Occupational Therapy Treatment Patient Details Name: Sophia Oliver MRN: [id number] DOB: 07-22-1924 Today's Date: 06/29/2014    History of present illness 80 y.o. female with past medical history of breast cancer, chronic systolic CHF, DM, HTN, chronic low back pain, pacemaker admitted for severe sepsis.   OT comments  Pt motivated but limited by endurance  Follow Up Recommendations  SNF    Equipment Recommendations  None recommended by OT    Recommendations for Other Services      Precautions / Restrictions Precautions Precautions: Fall Restrictions Weight Bearing Restrictions: No       Mobility Bed Mobility         Supine to sit: Min assist     General bed mobility comments: assist for trunk  Transfers     Transfers: Sit to/from Stand Sit to Stand: Min assist         General transfer comment: verbal cues for safe technique    Balance                                   ADL       Grooming: Oral care;Set up;Sitting                   Toilet Transfer: Minimal assistance;Stand-pivot (to recliner)             General ADL Comments: pt agreeable to grooming and up to chair but she felt too weak to try grooming from standing      Vision                     Perception     Praxis      Cognition   Behavior During Therapy: [facility] for tasks assessed/performed Overall Cognitive Status: Within Functional Limits for tasks assessed                       Extremity/Trunk Assessment               Exercises     Shoulder Instructions       General Comments      Pertinent Vitals/ Pain       Pain Assessment: No/denies pain  Home Living                                          Prior Functioning/Environment              Frequency Min 2X/week     Progress Toward Goals  OT Goals(current goals can now be found in the care plan section)  Progress towards OT goals: Progressing toward  goals (slowly)     Plan      Co-evaluation                 End of Session     Activity Tolerance Patient limited by fatigue   Patient Left in chair;with call bell/phone within reach;with chair alarm set   Nurse Communication          Time: 1478-2956 OT Time Calculation (min): 17 min  Charges: OT General Charges $OT Visit: 1 Procedure OT Treatments $Self Care/Home Management : 8-22 mins  Sundeep Cary 06/29/2014, 2:11 PM Lesle Chris, OTR/L [phone] 06/29/2014

## 2014-06-29 NOTE — Progress Notes (Signed)
Received report from [facility] and agree with assessmennt.

## 2014-06-30 LAB — CULTURE, BLOOD (ROUTINE X 2)
Culture: NO GROWTH
Culture: NO GROWTH

## 2014-06-30 LAB — GLUCOSE, CAPILLARY: Glucose-Capillary: 88 mg/dL (ref 70–99)

## 2014-06-30 MED ORDER — ASPIRIN 81 MG PO CHEW: 81.0000 mg | CHEWABLE_TABLET | Freq: Every day | ORAL | Status: DC

## 2014-06-30 MED ORDER — LEVOFLOXACIN 500 MG PO TABS: 500.0000 mg | ORAL_TABLET | Freq: Every day | ORAL | Status: DC

## 2014-06-30 NOTE — Progress Notes (Signed)
Report called to [facility] and talked with Garen Grams Rn.

## 2014-06-30 NOTE — Discharge Summary (Signed)
 Physician Discharge Summary  Sophia Oliver MRN:[id number] DOB: 06/22/1924 DOA: 06/24/2014  PCP: KIM, JAMES, MD  Admit date: 06/24/2014 Discharge date: 06/30/2014  Time spent: 35 minutes  Recommendations for Outpatient Follow-up:  1. Follow up with primary MD at SNF in 4-5 days  2. Follow up with Cardiology as previously scheduled 3. Continue Levofloxacin for 4 additional days 4. Repeat BMP Monday  Recommendations for primary care physician for things to follow:  Monitor clinically Oncology referral   Discharge Diagnoses:  Principal Problem:   Severe sepsis Active Problems:   CKD (chronic kidney disease), stage III   UTI (lower urinary tract infection)   Chronic systolic CHF (congestive heart failure)   Type II or unspecified type diabetes mellitus without mention of complication, not stated as uncontrolled   Lytic bone lesions on xray   HCAP (healthcare-associated pneumonia)  Discharge Condition: stable  Diet recommendation: regular  Filed Weights   06/25/14 0400 06/26/14 0400 06/26/14 1705  Weight: 51.6 kg (113 lb 12.1 oz) 54.7 kg (120 lb 9.5 oz) 47.074 kg (103 lb 12.5 oz)   History of present illness:  Sophia Oliver is a 80 y.o. female with past medical history of breast cancer, chronic systolic CHF with LVEF of 15-20% in May of 2015. Patient was discharged from the hospital on 7/26 after she was admitted for healthcare associated pneumonia and UTI. Patient was staying in a nursing home until one day prior to admission, she was discharged home, just to stay overnight and in the morning she felt she is nauseous and she was about to vomit so she came in to the hospital for further evaluation. She denies any fever, denies any chills, denies any cough or sputum production. In the ED initially was found to have low blood pressure of 65/38, blood pressure improved after boluses of IV fluids, patient also was slightly hypoxic requiring 4 L of oxygen to keep her saturation above 91%.  Patient responded well to IV fluids, admitted to step down for further evaluation.  Hospital Course:  Severe sepsis  - Patient presented with a respiratory rate of 21, WBC of 16.3, SBP of 55 and suspected infection.  - Patient has likely has healthcare associated pneumonia and was initially on broad spectrum antibiotics with Vancomycin and Zosyn, narrowed to Levofloxacin and was clinically stable and with stable respiratory status on room air.  - Hypotension resolved after IV fluids hydration in the ED.  - Patient admitted initially to step down now stable on the floor  - sepsis physiology is resolved, blood pressure is controlled, no active symptoms.  UTI  - Urinalysis consistent with UTI, however cultures negative as of 8/22. Has history of ESBL.  Healthcare associated pneumonia  - Patient presented with worsening infiltrates, likely due to HCAP. COntinue antibiotics for 4 additional days  - Patient denies any pleuritic chest pain or productive cough.  Chronic systolic CHF  - Patient has LVEF of 15-20% measured on May of 2015.  - Patient does not have lower extremity edema.  - There is lung infiltrates more likely due to HCAP  - continue previous medications, not on ACEI/ARB due to CKD CKD stage 3-4  - Creatinine baseline is 1.6-2.0. Presented with creatinine of 2.1.  - Creatinine went down to 1.6 and now stable  Atrial fibrillation  - Patient is on Lopressor for rate control and PRADAXA.  - Lopressor held because of low blood pressure initially, restarted at lower dose on 8/26  - Dr. Elmahi discontinued   Pradaxa and started patient on aspirin given high fall risk, I agree Bony lytic lesions  - CT head from May of 2015 showing bony lytic lesion involving the temporal and parietal bones.  - At the time question of multiple myeloma, please note that patient has history of breast cancer.  - Patient to followup with her primary oncologist as outpatient.  Procedures:  None     Consultations:  None   Discharge Exam: Filed Vitals:   06/29/14 0506 06/29/14 1354 06/29/14 2308 06/30/14 0608  BP: 133/63 123/51 115/62 141/65  Pulse: 65 68 65 65  Temp: 97.3 F (36.3 C) 98.1 F (36.7 C) 97.9 F (36.6 C) 97.2 F (36.2 C)  TempSrc: Oral Oral Oral Oral  Resp: 19 18 18 18  Height:      Weight:      SpO2: 97% 100% 95% 97%    General: NAD Cardiovascular: RRR Respiratory: CTA biL  Discharge Instructions     Medication List    STOP taking these medications       dabigatran 75 MG Caps capsule  Commonly known as:  PRADAXA      TAKE these medications       acetaminophen 325 MG tablet  Commonly known as:  TYLENOL  Take 325 mg by mouth every 6 (six) hours as needed for pain.     ALPRAZolam 0.25 MG tablet  Commonly known as:  XANAX  Take one tablet by mouth twice daily for anxiety     aspirin 81 MG chewable tablet  Chew 1 tablet (81 mg total) by mouth daily.     diclofenac sodium 1 % Gel  Commonly known as:  VOLTAREN  Apply 2 g topically 4 (four) times daily.     DSS 100 MG Caps  Take 100 mg by mouth 2 (two) times daily.     levofloxacin 500 MG tablet  Commonly known as:  LEVAQUIN  Take 1 tablet (500 mg total) by mouth daily. For 4 additional days     metoprolol 50 MG tablet  Commonly known as:  LOPRESSOR  Take 1 tablet (50 mg total) by mouth 2 (two) times daily.     pantoprazole 40 MG tablet  Commonly known as:  PROTONIX  Take 40 mg by mouth daily.     spironolactone 25 MG tablet  Commonly known as:  ALDACTONE  Take 25 mg by mouth daily.        The results of significant diagnostics from this hospitalization (including imaging, microbiology, ancillary and laboratory) are listed below for reference.    Significant Diagnostic Studies: Dg Chest 2 View  06/29/2014   CLINICAL DATA:  Shortness of breath, chest pain,  EXAM: CHEST  2 VIEW  COMPARISON:  06/24/2014  FINDINGS: Cardiomegaly again noted. Dual lead cardiac pacemaker is  unchanged in position. Small left pleural effusion. Bilateral lower lobe airspace disease. Findings suspicious for bilateral pneumonia rather than pulmonary edema.  IMPRESSION: Small left pleural effusion. Bilateral lower lobe airspace disease suspicious for bilateral pneumonia rather than pulmonary edema. Follow-up to resolution recommended.   Electronically Signed   By: Liviu  Pop M.D.   On: 06/29/2014 10:47   Dg Chest Port 1 View  06/24/2014   CLINICAL DATA:  Hypoxia.  Hypotension.  Short of breath.  EXAM: PORTABLE CHEST - 1 VIEW  COMPARISON:  05/27/2014  FINDINGS: Diffuse bilateral airspace disease is worse throughout the right and left lungs. Stable dual lead right subclavian pacemaker and leads which are intact. No pneumothorax.   Mild cardiomegaly.  IMPRESSION: Worsening diffuse bilateral airspace disease.   Electronically Signed   By: Art  Hoss M.D.   On: 06/24/2014 11:38    Microbiology: Recent Results (from the past 240 hour(s))  CULTURE, BLOOD (ROUTINE X 2)     Status: None   Collection Time    06/24/14 11:13 AM      Result Value Ref Range Status   Specimen Description BLOOD RIGHT WRIST   Final   Special Requests BOTTLES DRAWN AEROBIC AND ANAEROBIC 6 CC EA   Final   Culture  Setup Time     Final   Value: 06/24/2014 14:06     Performed at [facility]   Culture     Final   Value:        BLOOD CULTURE RECEIVED NO GROWTH TO DATE CULTURE WILL BE HELD FOR 5 DAYS BEFORE ISSUING A FINAL NEGATIVE REPORT     Performed at [facility]   Report Status PENDING   Incomplete  CULTURE, BLOOD (ROUTINE X 2)     Status: None   Collection Time    06/24/14 11:49 AM      Result Value Ref Range Status   Specimen Description BLOOD RIGHT HAND   Final   Special Requests BOTTLES DRAWN AEROBIC ONLY 3ML   Final   Culture  Setup Time     Final   Value: 06/24/2014 14:06     Performed at [facility]   Culture     Final   Value:        BLOOD CULTURE RECEIVED NO GROWTH TO DATE CULTURE  WILL BE HELD FOR 5 DAYS BEFORE ISSUING A FINAL NEGATIVE REPORT     Performed at [facility]   Report Status PENDING   Incomplete  URINE CULTURE     Status: None   Collection Time    06/24/14  1:47 PM      Result Value Ref Range Status   Specimen Description URINE, CATHETERIZED   Final   Special Requests NONE   Final   Culture  Setup Time     Final   Value: 06/24/2014 18:55     Performed at [facility]   Colony Count     Final   Value: NO GROWTH     Performed at [facility]   Culture     Final   Value: NO GROWTH     Performed at [facility]   Report Status 06/25/2014 FINAL   Final  MRSA PCR SCREENING     Status: None   Collection Time    06/24/14  3:35 PM      Result Value Ref Range Status   MRSA by PCR NEGATIVE  NEGATIVE Final   Comment:            The GeneXpert MRSA Assay (FDA     approved for NASAL specimens     only), is one component of a     comprehensive MRSA colonization     surveillance program. It is not     intended to diagnose MRSA     infection nor to guide or     monitor treatment for     MRSA infections.  URINE CULTURE     Status: None   Collection Time    06/24/14  6:21 PM      Result Value Ref Range Status   Specimen Description URINE, CATHETERIZED   Final   Special Requests   NONE   Final   Culture  Setup Time     Final   Value: 06/25/2014 00:07     Performed at [facility]     Final   Value: NO GROWTH     Performed at [facility]   Culture     Final   Value: NO GROWTH     Performed at [facility]   Report Status 06/26/2014 FINAL   Final     Labs: Basic Metabolic Panel:  Recent Labs Lab 06/25/14 0340 06/26/14 0406 06/27/14 0445 06/28/14 0504 06/29/14 0451  NA 138 138 136* 138 138  K 4.7 4.3 4.4 4.2 4.9  CL 108 108 107 111 109  CO2 17* 13* 12* 13* 13*  GLUCOSE 106* 92 102* 123* 126*  BUN 37* 39* 33* 26* 26*  CREATININE 2.13* 2.02* 1.67* 1.52* 1.52*  CALCIUM  7.9* 8.4 8.5 8.4 9.0   Liver Function Tests:  Recent Labs Lab 06/26/14 0406  AST 28  ALT 36*  ALKPHOS 58  BILITOT 0.5  PROT 5.3*  ALBUMIN 2.5*   CBC:  Recent Labs Lab 06/24/14 1113 06/24/14 1125 06/25/14 0340 06/26/14 0406 06/27/14 0445 06/28/14 0504  WBC 16.3*  --  11.7* 10.3 9.7 7.7  NEUTROABS 15.6*  --   --   --   --   --   HGB 11.1* 11.9* 8.8* 9.6* 9.5* 9.1*  HCT 33.2* 35.0* 26.7* 28.9* 28.5* 28.1*  MCV 96.5  --  94.7 95.4 96.9 96.6  PLT 226  --  161 166 171 179   BNP: BNP (last 3 results)  Recent Labs  12/06/13 1129 06/24/14 1113  PROBNP 200.0* 16090.0*   CBG:  Recent Labs Lab 06/29/14 0727 06/29/14 1223 06/29/14 1712 06/29/14 2306 06/30/14 0737  GLUCAP 132* 126* 83 106* 88   Signed:  GHERGHE, COSTIN  [facility] 06/30/2014, 8:01 AM

## 2014-07-01 ENCOUNTER — Non-Acute Institutional Stay (SKILLED_NURSING_FACILITY): Payer: Medicare Other | Admitting: Adult Health

## 2014-07-01 ENCOUNTER — Encounter: Payer: Self-pay | Admitting: Adult Health

## 2014-07-01 DIAGNOSIS — I509 Heart failure, unspecified: Secondary | ICD-10-CM

## 2014-07-01 DIAGNOSIS — I4891 Unspecified atrial fibrillation: Secondary | ICD-10-CM

## 2014-07-01 DIAGNOSIS — N183 Chronic kidney disease, stage 3 unspecified: Secondary | ICD-10-CM

## 2014-07-01 DIAGNOSIS — K1370 Unspecified lesions of oral mucosa: Secondary | ICD-10-CM

## 2014-07-01 DIAGNOSIS — I482 Chronic atrial fibrillation, unspecified: Secondary | ICD-10-CM

## 2014-07-01 DIAGNOSIS — F411 Generalized anxiety disorder: Secondary | ICD-10-CM

## 2014-07-01 DIAGNOSIS — K1379 Other lesions of oral mucosa: Secondary | ICD-10-CM

## 2014-07-01 DIAGNOSIS — J189 Pneumonia, unspecified organism: Secondary | ICD-10-CM

## 2014-07-01 DIAGNOSIS — M949 Disorder of cartilage, unspecified: Secondary | ICD-10-CM

## 2014-07-01 DIAGNOSIS — F419 Anxiety disorder, unspecified: Secondary | ICD-10-CM

## 2014-07-01 DIAGNOSIS — M899 Disorder of bone, unspecified: Secondary | ICD-10-CM

## 2014-07-01 DIAGNOSIS — I5022 Chronic systolic (congestive) heart failure: Secondary | ICD-10-CM

## 2014-07-01 NOTE — Progress Notes (Signed)
Patient ID: Sophia Oliver, female   DOB: 13-Dec-1923, 80 y.o.   MRN: [id number]               PROGRESS NOTE  DATE: 07/01/2014  FACILITY: Nursing Home Location: [facility]  LEVEL OF CARE: SNF (31)  Acute Visit  CHIEF COMPLAINT:  Follow-up Hospitalization  HISTORY OF PRESENT ILLNESS:  This is an 80 year old female who has been re-admitted to [facility] on 06/30/14 from [facility] with Severe Sepsis due to HCAP (healthcare acquired pneumonia). She has been admitted for a short-term rehabilitation.  REASSESSMENT OF ONGOING PROBLEM(S):  ATRIAL FIBRILLATION: the patients atrial fibrillation remains stable.  The patient denies DOE, tachycardia, orthopnea, transient neurological sx, pedal edema, palpitations, & PNDs.  No complications noted from the medications currently being used.  CHF:The patient does not relate significant weight changes, denies  DOE, orthopnea, PNDs, pedal edema, palpitations or chest pain.  CHF remains stable.  No complications form the medications being used.  PNEUMONIA: The pneumonia remains stable.  The patient denies ongoing chest pain, cough, shortness of breath, fever, chills or night sweats. No complications reported from the current antibiotic being used.  PAST MEDICAL HISTORY : Reviewed.  No changes/see problem list  CURRENT MEDICATIONS: Reviewed per MAR/see medication list  REVIEW OF SYSTEMS:  GENERAL: no change in appetite, no fatigue, no weight changes, no fever, chills or weakness THROAT:  complains of pain RESPIRATORY: no cough, SOB, DOE, wheezing, hemoptysis CARDIAC: no chest pain, edema or palpitations GI: no abdominal pain, diarrhea, constipation, heart burn, nausea or vomiting  PHYSICAL EXAMINATION  GENERAL: no acute distress, normal body habitus EYES: conjunctivae normal, sclerae normal, normal eye lids NECK: supple, trachea midline, no neck masses, no thyroid tenderness, no thyromegaly LYMPHATICS: no LAN in the  neck, no supraclavicular LAN RESPIRATORY: breathing is even & unlabored, BS CTAB CARDIAC: RRR, no murmur,no extra heart sounds, no edema, +pacemake GI: abdomen soft, normal BS, no masses, no tenderness, no hepatomegaly, no splenomegaly EXTREMITIES:  Able to move all 4 extremities PSYCHIATRIC: the patient is alert & oriented to person, affect & behavior appropriate  LABS/RADIOLOGY: Labs reviewed: Basic Metabolic Panel:  Recent Labs  03/16/14 0400  06/27/14 0445 06/28/14 0504 06/29/14 0451  NA 140  < > 136* 138 138  K 4.9  < > 4.4 4.2 4.9  CL 109  < > 107 111 109  CO2 18*  < > 12* 13* 13*  GLUCOSE 137*  < > 102* 123* 126*  BUN 55*  < > 33* 26* 26*  CREATININE 1.73*  < > 1.67* 1.52* 1.52*  CALCIUM 8.5  < > 8.5 8.4 9.0  MG 1.8  --   --   --   --   < > = values in this interval not displayed. Liver Function Tests:  Recent Labs  05/23/14 1605 05/24/14 0421 06/26/14 0406  AST 30 41* 28  ALT 84* 74* 36*  ALKPHOS 92 93 58  BILITOT 0.4 0.4 0.5  PROT 6.4 5.9* 5.3*  ALBUMIN 3.0* 2.8* 2.5*   CBC:  Recent Labs  03/15/14 1228  05/23/14 1605  06/24/14 1113  06/26/14 0406 06/27/14 0445 06/28/14 0504  WBC 20.8*  < > 6.0  < > 16.3*  < > 10.3 9.7 7.7  NEUTROABS 19.9*  --  5.4  --  15.6*  --   --   --   --   HGB 11.0*  < > 11.8*  < > 11.1*  < >  9.6* 9.5* 9.1*  HCT 33.3*  < > 35.9*  < > 33.2*  < > 28.9* 28.5* 28.1*  MCV 94.9  < > 98.4  < > 96.5  < > 95.4 96.9 96.6  PLT 204  < > 190  < > 226  < > 166 171 179  < > = values in this interval not displayed.  Cardiac Enzymes:  Recent Labs  03/09/14 1414 03/15/14 1228  TROPONINI <0.30 <0.30   CBG:  Recent Labs  06/29/14 1712 06/29/14 2306 06/30/14 0737  GLUCAP 83 106* 88    EXAM: METASTATIC BONE SURVEY   COMPARISON:  Bone scan 02/05/2005.   FINDINGS: Standard metastatic bone survey performed. Images of the axial and appendicular skeleton obtained. No evidence of metastatic disease. No focal lytic or sclerotic  bony lesions are noted. Postsurgical changes noted over both shoulders and over the chest. Postsurgical changes left hip and both knees. Severe degenerative changes noted throughout the spine.Peripheral vascular disease. Cardiomegaly is noted. Cardiac pacer with lead tips in right atrium right ventricle noted.   IMPRESSION: No evidence of metastatic disease EXAM: LEFT FOOT - COMPLETE 3+ VIEW   COMPARISON:  None.   FINDINGS: The alignment of the left foot is anatomic. Moderate first MTP joint osteoarthritis. Osteopenia. No displaced fracture is present. Fifth metatarsal appears within normal limits. Diabetic type small vessel atherosclerotic calcification. Second toe PIP joint severe degenerative changes are present, which may represent posttraumatic osteoarthritis or erosive osteoarthritis.   IMPRESSION: No acute osseous abnormality.  Osteopenia.   EXAM: PELVIS - 1-2 VIEW   COMPARISON:  None.   FINDINGS: Diffuse osteopenia is present. Left total hip arthroplasty. Atherosclerosis. Partially visualized lumbar spondylosis. The obturator rings appear intact. No fracture is identified. Visualized sacral arcades appear within normal limits.   Old left inferior pubic ramus fracture.   IMPRESSION: Osteopenia.  No acute osseous abnormality.   EXAM: CT HEAD WITHOUT CONTRAST   CT CERVICAL SPINE WITHOUT CONTRAST   TECHNIQUE: Multidetector CT imaging of the head and cervical spine was performed following the standard protocol without intravenous contrast. Multiplanar CT image reconstructions of the cervical spine were also generated.   COMPARISON:  CT HEAD W/O CM dated 03/09/2014; CT of the head and cervical spine without contrast dated 11/19/2012   FINDINGS: CT HEAD FINDINGS   There is moderate diffuse cerebral and cerebellar atrophy with compensatory ventriculomegaly. This is not out of proportion to the patient's age. There is decreased density in the deep white  matter of both cerebral hemispheres consistent with chronic small vessel ischemic type change. There is no evidence of an acute ischemic infarction. The cerebellum and brainstem are normal in density.   At bone window settings there is soft tissue density material within the right sphenoid sinus which is stable. This may reflect chronic inflammation. There are no sinus air-fluid levels. The mastoid air cells are well pneumatized. There is no evidence of an acute skull fracture. There remain rounded lucencies within the frontal and parietal bones on the right.   CT CERVICAL SPINE FINDINGS   There is stable loss of the normal cervical lordosis. There is stable anterolisthesis of C3 with respect to C4 amounting to approximately 4 mm. There is stable disc space narrowing at C3-4, C4-5, C5-6, and C6-7. The prevertebral soft tissue spaces appear normal. There is no perched facet. There is fusion across the facets at the C4-5 level on the right. The odontoid is intact and the lateral masses of C1 align normally with  those of C2. The bony ring at the cervical level is intact. The observed portions of the first and second ribs appear normal. The pulmonary apices are clear.   IMPRESSION: 1. There is no evidence of an acute ischemic or hemorrhagic infarction. There are age related atrophic changes. There is no intracranial mass effect. 2. There stable lucencies within the right frontal and right parietal bones consistent with the clinically suspected multiple myeloma. 3. There is no evidence of an acute cervical spine fracture nor dislocation. No lytic or blastic vertebral body or posterior element lesion is demonstrated. There are degenerative disc and facet joint changes at multiple cervical levels. Stable grade 1 anterolisthesis of C3 with respect to C4 is present. EXAM: CT HEAD WITHOUT CONTRAST   CT CERVICAL SPINE WITHOUT CONTRAST   TECHNIQUE: Multidetector CT imaging of the head  and cervical spine was performed following the standard protocol without intravenous contrast. Multiplanar CT image reconstructions of the cervical spine were also generated.   COMPARISON:  CT HEAD W/O CM dated 03/09/2014; CT of the head and cervical spine without contrast dated 11/19/2012   FINDINGS: CT HEAD FINDINGS   There is moderate diffuse cerebral and cerebellar atrophy with compensatory ventriculomegaly. This is not out of proportion to the patient's age. There is decreased density in the deep white matter of both cerebral hemispheres consistent with chronic small vessel ischemic type change. There is no evidence of an acute ischemic infarction. The cerebellum and brainstem are normal in density.   At bone window settings there is soft tissue density material within the right sphenoid sinus which is stable. This may reflect chronic inflammation. There are no sinus air-fluid levels. The mastoid air cells are well pneumatized. There is no evidence of an acute skull fracture. There remain rounded lucencies within the frontal and parietal bones on the right.   CT CERVICAL SPINE FINDINGS   There is stable loss of the normal cervical lordosis. There is stable anterolisthesis of C3 with respect to C4 amounting to approximately 4 mm. There is stable disc space narrowing at C3-4, C4-5, C5-6, and C6-7. The prevertebral soft tissue spaces appear normal. There is no perched facet. There is fusion across the facets at the C4-5 level on the right. The odontoid is intact and the lateral masses of C1 align normally with those of C2. The bony ring at the cervical level is intact. The observed portions of the first and second ribs appear normal. The pulmonary apices are clear.   IMPRESSION: 1. There is no evidence of an acute ischemic or hemorrhagic infarction. There are age related atrophic changes. There is no intracranial mass effect. 2. There stable lucencies within the right frontal  and right parietal bones consistent with the clinically suspected multiple myeloma. 3. There is no evidence of an acute cervical spine fracture nor dislocation. No lytic or blastic vertebral body or posterior element lesion is demonstrated. There are degenerative disc and facet joint changes at multiple cervical levels. Stable grade 1 anterolisthesis of C3 with respect to C4 is present. EXAM: CHEST  2 VIEW   COMPARISON:  06/24/2014   FINDINGS: Cardiomegaly again noted. Dual lead cardiac pacemaker is unchanged in position. Small left pleural effusion. Bilateral lower lobe airspace disease. Findings suspicious for bilateral pneumonia rather than pulmonary edema.   IMPRESSION: Small left pleural effusion. Bilateral lower lobe airspace disease suspicious for bilateral pneumonia rather than pulmonary edema. Follow-up to resolution recommended.   ASSESSMENT/PLAN:  Healthcare acquired pneumonia - continue Levaquin x4 more  days Chronic kidney disease, stage III - check BMP Chronic atrial fibrillation - rate controlled; continue Lopressor and aspirin; Pradaxa was recently discontinued due to high fall risk Chronic systolic heart failure - stable; continue Aldactone Anxiety - stable; continue Xanax Bony Lytic Lesion - followup with  primary oncologist as an outpatient Oral Pain (new) - start Magic mouthwash 10 mL swish and swallow 4 times a day x2 weeks   CPT CODE: 76195  Joaopedro Eschbach Vargas - NP [facility] [phone]

## 2014-07-05 ENCOUNTER — Non-Acute Institutional Stay (SKILLED_NURSING_FACILITY): Payer: Medicare Other | Admitting: Internal Medicine

## 2014-07-05 DIAGNOSIS — N183 Chronic kidney disease, stage 3 unspecified: Secondary | ICD-10-CM

## 2014-07-05 DIAGNOSIS — I5022 Chronic systolic (congestive) heart failure: Secondary | ICD-10-CM

## 2014-07-05 DIAGNOSIS — E1129 Type 2 diabetes mellitus with other diabetic kidney complication: Secondary | ICD-10-CM

## 2014-07-05 DIAGNOSIS — I509 Heart failure, unspecified: Secondary | ICD-10-CM

## 2014-07-05 DIAGNOSIS — I482 Chronic atrial fibrillation, unspecified: Secondary | ICD-10-CM

## 2014-07-05 DIAGNOSIS — I4891 Unspecified atrial fibrillation: Secondary | ICD-10-CM

## 2014-07-05 NOTE — Progress Notes (Signed)
HISTORY & PHYSICAL  DATE: 07/05/2014   FACILITY: [facility]  LEVEL OF CARE: SNF (31)  ALLERGIES:  Allergies  Allergen Reactions  . Codeine Nausea And Vomiting  . Hydrocodone-Acetaminophen Itching  . Other Nausea And Vomiting    Narcotics  dizziness    CHIEF COMPLAINT:  Manage CHF, atrial fibrillation and chronic kidney disease stage III  HISTORY OF PRESENT ILLNESS: 80 year old Caucasian female was hospitalized secondary to severe sepsis. After hospitalization she is admitted to this facility for short-term rehabilitation.  ATRIAL FIBRILLATION: the patients atrial fibrillation remains stable.  The patient denies DOE, tachycardia, orthopnea, transient neurological sx, pedal edema, palpitations, & PNDs.  No complications noted from the medications currently being used.  CHF:The patient does not relate significant weight changes, denies sob, DOE, orthopnea, PNDs, pedal edema, palpitations or chest pain.  CHF remains stable.  No complications form the medications being used. EF 15-20%.  CHRONIC KIDNEY DISEASE: The patient's chronic kidney disease remains stable.  Patient denies increasing lower extremity swelling or confusion. Last creatinine are: 1.6-2.  PAST MEDICAL HISTORY :  Past Medical History  Diagnosis Date  . Syncope   . Sinus node dysfunction     a. s/p pacemaker.  . Wide-complex tachycardia   . Cholelithiasis   . Glucose intolerance (impaired glucose tolerance)   . Anxiety   . PONV (postoperative nausea and vomiting)   . Chronic kidney disease     a. Baseline Cr reported 1.8-2.0.  . Injury of right upper arm     after venipuncture in 3/13 - pt describes pain afterwards- no followup done seen by PCP- no further treatment   . PAF (paroxysmal atrial fibrillation)   . Left bundle branch block   . Tachycardia-bradycardia syndrome   . CHF (congestive heart failure)     a. EF 30-35% by echo 2015.  [name] Pacemaker     Guidant, implanted  2006  . Type II diabetes mellitus     "recently dx'd" (03/09/2014)  . Breast cancer 2003    "radiation on right; mastectomy on the left"  . Hypertension   . GERD (gastroesophageal reflux disease)   . DJD (degenerative joint disease)   . Arthritis     "real bad in my lower back" (03/09/2014)  . Chronic lower back pain   . Orthostatic hypotension     a. Diuretics d/c'd 03/2014.  [name] PAT (paroxysmal atrial tachycardia)     PAST SURGICAL HISTORY: Past Surgical History  Procedure Laterality Date  . Replacement total knee bilateral Bilateral   . Total shoulder replacement    . Tonsillectomy    . Orif distal femur fracture Right 12/2007  . Tonsillectomy    . Mastectomy Left 2003  . Total hip arthroplasty  06/16/2012    Procedure: TOTAL HIP ARTHROPLASTY;  Surgeon: Mauri Pole, MD;  Location: [facility];  Service: Orthopedics;  Laterality: Left;  . Hip closed reduction  06/30/2012    Procedure: CLOSED MANIPULATION HIP;  Surgeon: Mauri Pole, MD;  Location: [facility];  Service: Orthopedics;  Laterality: Left;  . Abdominal hysterectomy    . Pacemaker insertion      Guidant Insignia I  . Cataract extraction w/ intraocular lens  implant, bilateral Bilateral 2000's  . Refractive surgery Bilateral 01/2014  . Inguinal hernia repair Left     "had emergency OR for it a few years ago" (03/09/2014)  . Joint replacement    . Breast biopsy Bilateral  SOCIAL HISTORY:  reports that she has quit smoking. Her smoking use included Cigarettes. She has a 3 pack-year smoking history. She has never used smokeless tobacco. She reports that she drinks alcohol. She reports that she does not use illicit drugs.  FAMILY HISTORY:  Family History  Problem Relation Age of Onset  . Hypertension      CURRENT MEDICATIONS: Reviewed per MAR/see medication list  REVIEW OF SYSTEMS:  See HPI otherwise 14 point ROS is negative.  PHYSICAL EXAMINATION  VS:  See VS section  GENERAL: no acute distress, normal body  habitus EYES: conjunctivae normal, sclerae normal, normal eye lids MOUTH/THROAT: lips without lesions,no lesions in the mouth,tongue is without lesions,uvula elevates in midline NECK: supple, trachea midline, no neck masses, no thyroid tenderness, no thyromegaly LYMPHATICS: no LAN in the neck, no supraclavicular LAN RESPIRATORY: breathing is even & unlabored, BS CTAB CARDIAC: Heart rate is irregularly irregular, no murmur,no extra heart sounds, no edema GI:  ABDOMEN: abdomen soft, normal BS, no masses, no tenderness  LIVER/SPLEEN: no hepatomegaly, no splenomegaly MUSCULOSKELETAL: HEAD: normal to inspection  EXTREMITIES: LEFT UPPER EXTREMITY: Limited range of motion, decreased strength & tone RIGHT UPPER EXTREMITY: Limited range of motion, decreased strength & tone LEFT LOWER EXTREMITY:  Moderate range of motion, decreased strength & tone RIGHT LOWER EXTREMITY: Moderate range of motion, decreased strength & tone PSYCHIATRIC: the patient is alert & oriented to person, affect & behavior appropriate  LABS/RADIOLOGY:  Labs reviewed: Basic Metabolic Panel:  Recent Labs  03/16/14 0400  06/27/14 0445 06/28/14 0504 06/29/14 0451  NA 140  < > 136* 138 138  K 4.9  < > 4.4 4.2 4.9  CL 109  < > 107 111 109  CO2 18*  < > 12* 13* 13*  GLUCOSE 137*  < > 102* 123* 126*  BUN 55*  < > 33* 26* 26*  CREATININE 1.73*  < > 1.67* 1.52* 1.52*  CALCIUM 8.5  < > 8.5 8.4 9.0  MG 1.8  --   --   --   --   < > = values in this interval not displayed. Liver Function Tests:  Recent Labs  05/23/14 1605 05/24/14 0421 06/26/14 0406  AST 30 41* 28  ALT 84* 74* 36*  ALKPHOS 92 93 58  BILITOT 0.4 0.4 0.5  PROT 6.4 5.9* 5.3*  ALBUMIN 3.0* 2.8* 2.5*   CBC:  Recent Labs  03/15/14 1228  05/23/14 1605  06/24/14 1113  06/26/14 0406 06/27/14 0445 06/28/14 0504  WBC 20.8*  < > 6.0  < > 16.3*  < > 10.3 9.7 7.7  NEUTROABS 19.9*  --  5.4  --  15.6*  --   --   --   --   HGB 11.0*  < > 11.8*  < > 11.1*   < > 9.6* 9.5* 9.1*  HCT 33.3*  < > 35.9*  < > 33.2*  < > 28.9* 28.5* 28.1*  MCV 94.9  < > 98.4  < > 96.5  < > 95.4 96.9 96.6  PLT 204  < > 190  < > 226  < > 166 171 179  < > = values in this interval not displayed.  Cardiac Enzymes:  Recent Labs  03/09/14 1414 03/15/14 1228  TROPONINI <0.30 <0.30   CBG:  Recent Labs  06/29/14 1712 06/29/14 2306 06/30/14 0737  GLUCAP 83 106* 88    Transthoracic Echocardiography  Patient:    Jordyn, Hofacker MR #:  [id number] Study Date: 03/17/2014 Gender:     F Age:        52 Height:     154.9cm Weight:     51.3kg BSA:        1.58m^2 Pt. Status: Room:       Sutherland, RDCS  ADMITTING    Lala Lund  Dwyane Luo, Mickle Mallory  ATTENDING    Rowe Clack  PERFORMING   Chmg, Inpatient cc:  ------------------------------------------------------------ LV EF: 15% -   20%  ------------------------------------------------------------ Indications:      CHF - 428.0.  ------------------------------------------------------------ History:   PMH:   Syncope.  Atrial fibrillation.  Congestive heart failure.  Risk factors:  CKD stage III. UTI. Tachy-brady syndrome. Weakness. Hyperkalemia. Acute heart failure. Former tobacco use. Diabetes mellitus.  ------------------------------------------------------------ Study Conclusions  - Left ventricle: The cavity size was normal. Wall thickness   was increased in a pattern of mild LVH. Systolic function   was severely reduced. The estimated ejection fraction was   in the range of 15% to 20%. Dyskinesis of the   basal-midanteroseptal myocardium. Features are consistent   with a pseudonormal left ventricular filling pattern, with   concomitant abnormal relaxation and increased filling   pressure (grade 2 diastolic dysfunction). - Ventricular septum: The contour showed diastolic   flattening. - Mitral valve: Moderately calcified  annulus. Moderate to   severe regurgitation. - Left atrium: The atrium was severely dilated. - Right ventricle: The cavity size was mildly dilated. - Right atrium: The atrium was moderately to severely   dilated. - Tricuspid valve: Severe regurgitation. - Pulmonary arteries: Systolic pressure was moderately to   severely increased. PA peak pressure: 26mm Hg (S).  ------------------------------------------------------------ Labs, prior tests, procedures, and surgery: Transthoracic echocardiography (December 06, 2013).     EF was 35%.  Permanent pacemaker system implantation. Transthoracic echocardiography.  M-mode, complete 2D, spectral Doppler, and color Doppler.  Height:  Height: 154.9cm. Height: 61in.  Weight:  Weight: 51.3kg. Weight: 112.8lb.  Body mass index:  BMI: 21.4kg/m^2.  Body surface area:    BSA: 1.64m^2.  Blood pressure:     114/70.  Patient status:  Inpatient.  Location:  Bedside.  ------------------------------------------------------------  ------------------------------------------------------------ Left ventricle:  The cavity size was normal. Wall thickness was increased in a pattern of mild LVH. Systolic function was severely reduced. The estimated ejection fraction was in the range of 15% to 20%.  Regional wall motion abnormalities:   Dyskinesis of the basal-midanteroseptal myocardium. Early diastolic septal annular tissue Doppler velocities Ea were abnormal. Features are consistent with a pseudonormal left ventricular filling pattern, with concomitant abnormal relaxation and increased filling pressure (grade 2 diastolic dysfunction).  ------------------------------------------------------------ Aortic valve:   Structurally normal valve.   Cusp separation was normal.  Doppler:  Transvalvular velocity was within the normal range. There was no stenosis.  No regurgitation.  ------------------------------------------------------------ Aorta:  Aortic root:  The aortic root was normal in size. Ascending aorta: The ascending aorta was normal in size.  ------------------------------------------------------------ Mitral valve:   Moderately calcified annulus.  Doppler: There was no evidence for stenosis.    Moderate to severe regurgitation.    Peak gradient: 87mm Hg (D).  ------------------------------------------------------------ Left atrium:  The atrium was severely dilated.  ------------------------------------------------------------ Right ventricle:  The cavity size was mildly dilated. Pacer wire or catheter noted in right ventricle. Systolic function was normal.  ------------------------------------------------------------ Ventricular septum:   The contour  showed diastolic flattening.  ------------------------------------------------------------ Pulmonic valve:    Structurally normal valve.   Cusp separation was normal.  Doppler:  Transvalvular velocity was within the normal range.  Trivial regurgitation.  ------------------------------------------------------------ Tricuspid valve:   Structurally normal valve.   Leaflet separation was normal.  Doppler:  Transvalvular velocity was within the normal range.  Severe regurgitation.  ------------------------------------------------------------ Pulmonary artery:   Systolic pressure was moderately to severely increased.  ------------------------------------------------------------ Right atrium:  The atrium was moderately to severely dilated. Pacer wire or catheter noted in right atrium.  ------------------------------------------------------------ Pericardium:  There was no pericardial effusion.  ------------------------------------------------------------ Systemic veins: Inferior vena cava: The vessel was normal in size; the respirophasic diameter changes were in the normal range (= 50%); findings are consistent with normal central  venous pressure.  ------------------------------------------------------------  2D measurements        Normal  Doppler               Normal Left ventricle                 measurements LVID ED,   41.1 mm     43-52   Main pulmonary chord,                         artery PLAX                           Pressure, S    65 mm  =30 LVID ES,   37.6 mm     23-38                     Hg chord,                         Left ventricle PLAX                           Ea, lat      9.47 cm/ ------- FS, chord,    9 %      >29     ann, tiss         s PLAX                           DP LVPW, ED   12.8 mm     ------  E/Ea, lat   11.51     ------- IVS/LVPW   1.01        <1.3    ann, tiss ratio, ED                      DP Ventricular septum             Ea, med      6.02 cm/ ------- IVS, ED    12.9 mm     ------  ann, tiss         s Aorta                          DP Root diam,   26 mm     ------  E/Ea, med   18.11     ------- ED  ann, tiss Left atrium                    DP AP dim       44 mm     ------  Mitral valve AP dim     2.97 cm/m^2 <2.2    Peak E vel    109 cm/ ------- index                                            s Vol, S       80 ml     ------  Peak A vel   87.4 cm/ ------- Vol index, 54.1 ml/m^2 ------                    s S                              Deceleratio   134 ms  150-230                                n time                                Peak            5 mm  -------                                gradient, D       Hg                                Peak E/A      1.2     -------                                ratio                                Regurg       38.5 cm/ -------                                alias vel,        s                                PISA                                Max regurg    425 cm/ -------                                vel               s  Regurg VTI    155 cm  -------                                 ERO, PISA    0.14 cm^ -------                                                  2                                Regurg vol,    22 ml  -------                                PISA                                Tricuspid valve                                Regurg peak   355 cm/ -------                                vel               s                                Peak RV-RA     50 mm  -------                                gradient, S       Hg                                Systemic veins                                Estimated      15 mm  -------                                CVP               Hg                                Right ventricle                                Pressure, S    65 mm  <30  Hg                                Sa vel, lat  7.14 cm/ -------                                ann, tiss         s                                DP   RENAL/URINARY TRACT ULTRASOUND COMPLETE   COMPARISON:  Abdominal CT 01/23/2012.  Renal ultrasound 01/08/2012.   FINDINGS: Right Kidney:   Length: 9.3 cm. There is progressive cortical thinning without focal cortical lesion or hydronephrosis. Mild perinephric fluid is present.   Left Kidney:   Length: 9.4 cm. There is progressive cortical thinning. There is a mildly septated cyst in the lower pole which measures 4.2 x 2.7 x 3.3 cm. No hydronephrosis. Moderate size left pleural effusion noted incidentally.   Bladder:   Appears normal for degree of bladder distention.   IMPRESSION: Under   1. Progressive bilateral renal cortical thinning consistent with chronic medical renal disease. 2. No evidence of hydronephrosis. 3. Mildly septated cyst in the lower pole of the left kidney.     CHEST  2 VIEW   COMPARISON:  06/24/2014   FINDINGS: Cardiomegaly again noted. Dual lead cardiac pacemaker is unchanged in position. Small left pleural effusion. Bilateral lower  lobe airspace disease. Findings suspicious for bilateral pneumonia rather than pulmonary edema.   IMPRESSION: Small left pleural effusion. Bilateral lower lobe airspace disease suspicious for bilateral pneumonia rather than pulmonary edema. Follow-up to resolution recommended.  CT HEAD WITHOUT CONTRAST   CT CERVICAL SPINE WITHOUT CONTRAST   TECHNIQUE: Multidetector CT imaging of the head and cervical spine was performed following the standard protocol without intravenous contrast. Multiplanar CT image reconstructions of the cervical spine were also generated.   COMPARISON:  CT HEAD W/O CM dated 03/09/2014; CT of the head and cervical spine without contrast dated 11/19/2012   FINDINGS: CT HEAD FINDINGS   There is moderate diffuse cerebral and cerebellar atrophy with compensatory ventriculomegaly. This is not out of proportion to the patient's age. There is decreased density in the deep white matter of both cerebral hemispheres consistent with chronic small vessel ischemic type change. There is no evidence of an acute ischemic infarction. The cerebellum and brainstem are normal in density.   At bone window settings there is soft tissue density material within the right sphenoid sinus which is stable. This may reflect chronic inflammation. There are no sinus air-fluid levels. The mastoid air cells are well pneumatized. There is no evidence of an acute skull fracture. There remain rounded lucencies within the frontal and parietal bones on the right.   CT CERVICAL SPINE FINDINGS   There is stable loss of the normal cervical lordosis. There is stable anterolisthesis of C3 with respect to C4 amounting to approximately 4 mm. There is stable disc space narrowing at C3-4, C4-5, C5-6, and C6-7. The prevertebral soft tissue spaces appear normal. There is no perched facet. There is fusion across the facets at the C4-5 level on the right. The odontoid is intact and the lateral masses of  C1 align normally with those of C2. The bony ring at the cervical level is  intact. The observed portions of the first and second ribs appear normal. The pulmonary apices are clear.   IMPRESSION: 1. There is no evidence of an acute ischemic or hemorrhagic infarction. There are age related atrophic changes. There is no intracranial mass effect. 2. There stable lucencies within the right frontal and right parietal bones consistent with the clinically suspected multiple myeloma. 3. There is no evidence of an acute cervical spine fracture nor dislocation. No lytic or blastic vertebral body or posterior element lesion is demonstrated. There are degenerative disc and facet joint changes at multiple cervical levels. Stable grade 1 anterolisthesis of C3 with respect to C4 is present.     ASSESSMENT/PLAN:  CHF-compensated  Atrial fibrillation-rate controlled Chronic kidney disease stage III-recheck renal functions Diabetes mellitus with renal complications-continue current medications Lytic bone lesions-patient has a history of breast cancer. Recommended that she followup with her oncologist. Check CBC and BMP  I have reviewed patient's medical records received at admission/from hospitalization.  CPT CODE: 13244  Miray Mancino [address], [place] [phone]

## 2014-07-14 ENCOUNTER — Non-Acute Institutional Stay (SKILLED_NURSING_FACILITY): Payer: Medicare Other | Admitting: Internal Medicine

## 2014-07-14 DIAGNOSIS — R404 Transient alteration of awareness: Secondary | ICD-10-CM

## 2014-07-14 DIAGNOSIS — I5022 Chronic systolic (congestive) heart failure: Secondary | ICD-10-CM

## 2014-07-14 DIAGNOSIS — I509 Heart failure, unspecified: Secondary | ICD-10-CM

## 2014-07-20 NOTE — Progress Notes (Signed)
Patient ID: Sophia Oliver, female   DOB: Jun 02, 1924, 80 y.o.   MRN: [id number]                PROGRESS NOTE  DATE: 07/14/2014          FACILITY: [facility]  LEVEL OF CARE: SNF (31)  Acute Visit       ALLERGIES:  Allergies  Allergen Reactions  . Codeine Nausea And Vomiting  . Hydrocodone-Acetaminophen Itching  . Other Nausea And Vomiting    Narcotics  dizziness    CHIEF COMPLAINT:  Manage altered mental status.    HISTORY OF PRESENT ILLNESS:  I was requested by the staff to assess the patient because patient acutely became unresponsive, pale, and clammy.  She is a DNR.  With verbal stimuli, tactile stimuli and support when I arrived in the room, the patient is responsive.  She denies shortness of breath, chest pain, abdominal pain, or nausea.  She denies headaches, dizziness, or visual disturbances.  Staff report that there is increasing lower extremity swelling from before.    PAST MEDICAL HISTORY :  Past Medical History  Diagnosis Date  . Syncope   . Sinus node dysfunction     a. s/p pacemaker.  . Wide-complex tachycardia   . Cholelithiasis   . Glucose intolerance (impaired glucose tolerance)   . Anxiety   . PONV (postoperative nausea and vomiting)   . Chronic kidney disease     a. Baseline Cr reported 1.8-2.0.  . Injury of right upper arm     after venipuncture in 3/13 - pt describes pain afterwards- no followup done seen by PCP- no further treatment   . PAF (paroxysmal atrial fibrillation)   . Left bundle branch block   . Tachycardia-bradycardia syndrome   . CHF (congestive heart failure)     a. EF 30-35% by echo 2015.  [name] Pacemaker     Guidant, implanted 2006  . Type II diabetes mellitus     "recently dx'd" (03/09/2014)  . Breast cancer 2003    "radiation on right; mastectomy on the left"  . Hypertension   . GERD (gastroesophageal reflux disease)   . DJD (degenerative joint disease)   . Arthritis     "real bad in my lower back" (03/09/2014)    . Chronic lower back pain   . Orthostatic hypotension     a. Diuretics d/c'd 03/2014.  [name] PAT (paroxysmal atrial tachycardia)     PAST SURGICAL HISTORY: Past Surgical History  Procedure Laterality Date  . Replacement total knee bilateral Bilateral   . Total shoulder replacement    . Tonsillectomy    . Orif distal femur fracture Right 12/2007  . Tonsillectomy    . Mastectomy Left 2003  . Total hip arthroplasty  06/16/2012    Procedure: TOTAL HIP ARTHROPLASTY;  Surgeon: Mauri Pole, MD;  Location: [facility];  Service: Orthopedics;  Laterality: Left;  . Hip closed reduction  06/30/2012    Procedure: CLOSED MANIPULATION HIP;  Surgeon: Mauri Pole, MD;  Location: [facility];  Service: Orthopedics;  Laterality: Left;  . Abdominal hysterectomy    . Pacemaker insertion      Guidant Insignia I  . Cataract extraction w/ intraocular lens  implant, bilateral Bilateral 2000's  . Refractive surgery Bilateral 01/2014  . Inguinal hernia repair Left     "had emergency OR for it a few years ago" (03/09/2014)  . Joint replacement    . Breast biopsy Bilateral  SOCIAL HISTORY:  reports that she has quit smoking. Her smoking use included Cigarettes. She has a 3 pack-year smoking history. She has never used smokeless tobacco. She reports that she drinks alcohol. She reports that she does not use illicit drugs.  FAMILY HISTORY:  Family History  Problem Relation Age of Onset  . Hypertension      CURRENT MEDICATIONS: Reviewed per MAR/see medication list  REVIEW OF SYSTEMS:  See HPI otherwise 14 point ROS is negative.  PHYSICAL EXAMINATION  VS:  See VS section  GENERAL: no acute distress, normal body habitus EYES: conjunctivae normal, sclerae normal, normal eye lids MOUTH/THROAT: lips without lesions,no lesions in the mouth,tongue is without lesions,uvula elevates in midline NECK: supple, trachea midline, no neck masses, no thyroid tenderness, no thyromegaly LYMPHATICS: no LAN in the neck, no  supraclavicular LAN RESPIRATORY: breath sounds diminished, breathing is even & unlabored, BS CTAB CARDIAC: RRR, no murmur,no extra heart sounds, +2 bilateral pitting pedal edema      GI:  ABDOMEN: abdomen soft, bowel sounds diminished, no masses, no tenderness  LIVER/SPLEEN: no hepatomegaly, no splenomegaly MUSCULOSKELETAL: HEAD: normal to inspection  EXTREMITIES: LEFT UPPER EXTREMITY: full range of motion, normal strength & tone RIGHT UPPER EXTREMITY:  full range of motion, normal strength & tone LEFT LOWER EXTREMITY:  full range of motion, normal strength & tone RIGHT LOWER EXTREMITY:  full range of motion, normal strength & tone PSYCHIATRIC: the patient is alert & oriented to person, affect & behavior appropriate  LABS/RADIOLOGY:  On 07/04/2014:  Chloride 110, glucose 137, creatinine 1.4, otherwise BMP normal.     Labs reviewed: Basic Metabolic Panel:  Recent Labs  03/16/14 0400  06/27/14 0445 06/28/14 0504 06/29/14 0451  NA 140  < > 136* 138 138  K 4.9  < > 4.4 4.2 4.9  CL 109  < > 107 111 109  CO2 18*  < > 12* 13* 13*  GLUCOSE 137*  < > 102* 123* 126*  BUN 55*  < > 33* 26* 26*  CREATININE 1.73*  < > 1.67* 1.52* 1.52*  CALCIUM 8.5  < > 8.5 8.4 9.0  MG 1.8  --   --   --   --   < > = values in this interval not displayed. Liver Function Tests:  Recent Labs  05/23/14 1605 05/24/14 0421 06/26/14 0406  AST 30 41* 28  ALT 84* 74* 36*  ALKPHOS 92 93 58  BILITOT 0.4 0.4 0.5  PROT 6.4 5.9* 5.3*  ALBUMIN 3.0* 2.8* 2.5*   CBC:  Recent Labs  03/15/14 1228  05/23/14 1605  06/24/14 1113  06/26/14 0406 06/27/14 0445 06/28/14 0504  WBC 20.8*  < > 6.0  < > 16.3*  < > 10.3 9.7 7.7  NEUTROABS 19.9*  --  5.4  --  15.6*  --   --   --   --   HGB 11.0*  < > 11.8*  < > 11.1*  < > 9.6* 9.5* 9.1*  HCT 33.3*  < > 35.9*  < > 33.2*  < > 28.9* 28.5* 28.1*  MCV 94.9  < > 98.4  < > 96.5  < > 95.4 96.9 96.6  PLT 204  < > 190  < > 226  < > 166 171 179  < > = values in this  interval not displayed.  Cardiac Enzymes:  Recent Labs  03/09/14 1414 03/15/14 1228  TROPONINI <0.30 <0.30   CBG:  Recent Labs  06/29/14 1712 06/29/14 2306  06/30/14 0737  GLUCAP 83 106* 88    ASSESSMENT/PLAN:  Altered mental status.  Patient is now responsive, but clinically still unstable.  See Past Medical History for extensive comorbid conditions.  We will obtain a stat CBC with diff, BNP, BMP, and a chest x-ray.    CHF.  Her EF is only 15-20%.  We will increase Aldactone to 50 mg q.d. for three days, then decrease to 25 mg q.d.  Discussed with staff at bedside.    I have reviewed patient's medical records received at admission/from hospitalization.  CPT CODE: 29798          Babbie Dondlinger Y Sabella Traore, [place] [phone]

## 2014-07-27 ENCOUNTER — Other Ambulatory Visit: Payer: Self-pay | Admitting: *Deleted

## 2014-08-02 ENCOUNTER — Ambulatory Visit (INDEPENDENT_AMBULATORY_CARE_PROVIDER_SITE_OTHER): Payer: Medicare Other | Admitting: *Deleted

## 2014-08-02 DIAGNOSIS — I495 Sick sinus syndrome: Secondary | ICD-10-CM

## 2014-08-02 DIAGNOSIS — Z950 Presence of cardiac pacemaker: Secondary | ICD-10-CM

## 2014-08-02 DIAGNOSIS — R0602 Shortness of breath: Secondary | ICD-10-CM

## 2014-08-02 MED ORDER — METOPROLOL TARTRATE 50 MG PO TABS: 75.0000 mg | ORAL_TABLET | Freq: Two times a day (BID) | ORAL | Status: DC

## 2014-08-03 LAB — MDC_IDC_ENUM_SESS_TYPE_INCLINIC
Date Time Interrogation Session: 20150929040000
Implantable Pulse Generator Model: 1297
Lead Channel Impedance Value: 2500 Ohm
Lead Channel Impedance Value: 360 Ohm
Lead Channel Pacing Threshold Amplitude: 1 V
Lead Channel Pacing Threshold Amplitude: 3.3 V
Lead Channel Pacing Threshold Pulse Width: 1 ms
Lead Channel Sensing Intrinsic Amplitude: 0.3 mV
Lead Channel Sensing Intrinsic Amplitude: 4.3 mV
Lead Channel Setting Sensing Sensitivity: 2.5 mV
MDC IDC MSMT LEADCHNL RV PACING THRESHOLD PULSEWIDTH: 1 ms
MDC IDC PG SERIAL: 313770
MDC IDC SET LEADCHNL RA PACING AMPLITUDE: 2 V
MDC IDC SET LEADCHNL RV PACING AMPLITUDE: 5 V
MDC IDC SET LEADCHNL RV PACING PULSEWIDTH: 1 ms
MDC IDC STAT BRADY RA PERCENT PACED: 49 %
MDC IDC STAT BRADY RV PERCENT PACED: 49 %

## 2014-08-03 NOTE — Progress Notes (Signed)
Pacemaker check in clinic for palps. Stable device function. Thresholds, sensing, impedances consistent with previous measurements. Device programmed to maximize longevity.  ATR episodes noted---most recent 8-25. No high ventricular rates noted. Device programmed at appropriate safety margins. Histogram distribution appropriate for patient activity level. Device programmed to optimize intrinsic conduction. Estimated longevity 1.5 years. Lopressor increased to 75 per SK. Patient will follow up with SK in 10-2014.

## 2014-08-16 ENCOUNTER — Non-Acute Institutional Stay (SKILLED_NURSING_FACILITY): Payer: Medicare Other | Admitting: Adult Health

## 2014-08-16 ENCOUNTER — Encounter: Payer: Self-pay | Admitting: Adult Health

## 2014-08-16 DIAGNOSIS — K219 Gastro-esophageal reflux disease without esophagitis: Secondary | ICD-10-CM

## 2014-08-16 DIAGNOSIS — I482 Chronic atrial fibrillation, unspecified: Secondary | ICD-10-CM

## 2014-08-16 DIAGNOSIS — F419 Anxiety disorder, unspecified: Secondary | ICD-10-CM

## 2014-08-16 DIAGNOSIS — I5022 Chronic systolic (congestive) heart failure: Secondary | ICD-10-CM

## 2014-08-16 DIAGNOSIS — E118 Type 2 diabetes mellitus with unspecified complications: Secondary | ICD-10-CM

## 2014-08-16 NOTE — Progress Notes (Signed)
Patient ID: Sophia Oliver, female   DOB: [birth year], 80 y.o.   MRN: [id number]              PROGRESS NOTE  DATE: 08/16/2014   FACILITY: [facility]  LEVEL OF CARE: SNF (31)  Discharge Notes  HISTORY OF PRESENT ILLNESS:   This is an 80 year old female who is for discharge home with Home health PT, OT, Nursing and Home health aide. DME:  Wheelchair 18" X 18" with elevating leg rests, anti-tippers and cushion and O2 @ 2L/min via [place] continuously stationary and portable (gas). Patient gets short of breath when on room air and O2 sat goes down to 73% on room air @ rest. She has been admitted to [facility] on 06/30/14 from [facility] with Severe Sepsis. Patient was admitted to this facility for short-term rehabilitation after the patient's recent hospitalization.  Patient has completed SNF rehabilitation and therapy has cleared the patient for discharge.  Reassessment of ongoing problem(s):  ATRIAL FIBRILLATION: the patients atrial fibrillation remains stable.  The patient denies DOE, tachycardia, orthopnea, transient neurological sx, palpitations, & PNDs.  No complications noted from the medications currently being used.  DM:pt's DM remains stable.  Pt denies polyuria, polydipsia, polyphagia, changes in vision or hypoglycemic episodes.  No medication presently being used - diet controlled.   7/15 hemoglobin A1c is: 6.2  CHF:The patient does not relate significant weight changes, DOE, orthopnea, PNDs, pedal edema, palpitations or chest pain.  CHF remains stable.  No complications form the medications being used.  Past Medical History  Diagnosis Date  . Syncope   . Sinus node dysfunction     a. s/p pacemaker.  . Wide-complex tachycardia   . Cholelithiasis   . Glucose intolerance (impaired glucose tolerance)   . Anxiety   . PONV (postoperative nausea and vomiting)   . Chronic kidney disease     a. Baseline Cr reported 1.8-2.0.  . Injury of right upper arm    after venipuncture in 3/13 - pt describes pain afterwards- no followup done seen by PCP- no further treatment   . PAF (paroxysmal atrial fibrillation)   . Left bundle branch block   . Tachycardia-bradycardia syndrome   . CHF (congestive heart failure)     a. EF 30-35% by echo 2015.  [name] Pacemaker     Guidant, implanted 2006  . Type II diabetes mellitus     "recently dx'd" (03/09/2014)  . Breast cancer 2003    "radiation on right; mastectomy on the left"  . Hypertension   . GERD (gastroesophageal reflux disease)   . DJD (degenerative joint disease)   . Arthritis     "real bad in my lower back" (03/09/2014)  . Chronic lower back pain   . Orthostatic hypotension     a. Diuretics d/c'd 03/2014.  [name] PAT (paroxysmal atrial tachycardia)      Reviewed.  No changes/see problem list  CURRENT MEDICATIONS: Reviewed per MAR/see medication list    Allergies  Allergen Reactions  . Codeine Nausea And Vomiting  . Hydrocodone-Acetaminophen Itching  . Other Nausea And Vomiting    Narcotics  dizziness     REVIEW OF SYSTEMS:  GENERAL: no change in appetite, no fatigue, no weight changes, no fever, chills or weakness RESPIRATORY: no cough, DOE, wheezing, hemoptysis CARDIAC: no chest pain, edema or palpitations GI: no abdominal pain, diarrhea, constipation, heart burn, nausea or vomiting  PHYSICAL EXAMINATION  GENERAL: no acute distress, normal body habitus EYES: conjunctivae  normal, sclerae normal, normal eye lids NECK: supple, trachea midline, no neck masses, no thyroid tenderness, no thyromegaly LYMPHATICS: no LAN in the neck, no supraclavicular LAN RESPIRATORY: breathing is even & unlabored, BS CTAB CARDIAC: Irregularly irregular heart rhythm, no murmur,no extra heart sounds, no edema; + pacemaker on the right chest GI: abdomen soft, normal BS, no masses, no tenderness, no hepatomegaly, no splenomegaly EXTREMITIES: able to move all 4 extremities; ambulates with walker PSYCHIATRIC: the  patient is alert & oriented to person, affect & behavior appropriate  LABS/RADIOLOGY: 07/15/14  WBC 6.4 hemoglobin 9.8 hematocrit 31.7 MCV 95.5 07/14/14  chest x-ray shows mild to moderate congestive heart failure or fluid overload 07/12/14  sodium 139 potassium 4.6 glucose 113 BUN 19 creatinine 1.5 calcium 9.0 07/11/2000/15  sodium 138 potassium 4.9 237 BUN 17 creatinine 1.4 calcium 8.7 Labs reviewed: Basic Metabolic Panel:  Recent Labs  03/16/14 0400  06/27/14 0445 06/28/14 0504 06/29/14 0451  NA 140  < > 136* 138 138  K 4.9  < > 4.4 4.2 4.9  CL 109  < > 107 111 109  CO2 18*  < > 12* 13* 13*  GLUCOSE 137*  < > 102* 123* 126*  BUN 55*  < > 33* 26* 26*  CREATININE 1.73*  < > 1.67* 1.52* 1.52*  CALCIUM 8.5  < > 8.5 8.4 9.0  MG 1.8  --   --   --   --   < > = values in this interval not displayed. Liver Function Tests:  Recent Labs  05/23/14 1605 05/24/14 0421 06/26/14 0406  AST 30 41* 28  ALT 84* 74* 36*  ALKPHOS 92 93 58  BILITOT 0.4 0.4 0.5  PROT 6.4 5.9* 5.3*  ALBUMIN 3.0* 2.8* 2.5*   CBC:  Recent Labs  03/15/14 1228  05/23/14 1605  06/24/14 1113  06/26/14 0406 06/27/14 0445 06/28/14 0504  WBC 20.8*  < > 6.0  < > 16.3*  < > 10.3 9.7 7.7  NEUTROABS 19.9*  --  5.4  --  15.6*  --   --   --   --   HGB 11.0*  < > 11.8*  < > 11.1*  < > 9.6* 9.5* 9.1*  HCT 33.3*  < > 35.9*  < > 33.2*  < > 28.9* 28.5* 28.1*  MCV 94.9  < > 98.4  < > 96.5  < > 95.4 96.9 96.6  PLT 204  < > 190  < > 226  < > 166 171 179  < > = values in this interval not displayed.  Cardiac Enzymes:  Recent Labs  03/09/14 1414 03/15/14 1228  TROPONINI <0.30 <0.30   CBG:  Recent Labs  06/29/14 1712 06/29/14 2306 06/30/14 0737  GLUCAP 83 106* 88   Dg Chest Port 1 View  06/24/2014   CLINICAL DATA:  Hypoxia.  Hypotension.  Short of breath.  EXAM: PORTABLE CHEST - 1 VIEW  COMPARISON:  05/27/2014  FINDINGS: Diffuse bilateral airspace disease is worse throughout the right and left lungs. Stable  dual lead right subclavian pacemaker and leads which are intact. No pneumothorax. Mild cardiomegaly.  IMPRESSION: Worsening diffuse bilateral airspace disease.   Electronically Signed   By: Maryclare Bean M.D.   On: 06/24/2014 11:38    ASSESSMENT/PLAN:  Chronic systolic CHF - compensated; continue Aldactone Atrial fibrillation - rate controlled; continue aspirin Diabetes mellitus, type II - diet controlled GERD - stable; continue Protonix Anxiety - stable; continue Xanax   I have filled  out patient's discharge paperwork and written prescriptions.  Patient will receive home health PT, OT, Nursing and Home health aide.  DME provided:  Wheelchair 18" X 18" with elevating leg rests, anti-tippers and cushion and O2 @ 2L/min via [place] continuously stationary and portable (gas). Patient gets short of breath when on room air and O2 sat goes down to 73% on room air @ rest.  Total discharge time: Greater than 30 minutes  Discharge time involved coordination of the discharge process with social worker, nursing staff and therapy department. Medical justification for home health services/DME verified.  CPT CODE: 89211  MEDINA-VARGAS,Christiane Sistare, [facility] [phone]

## 2014-08-17 ENCOUNTER — Encounter: Payer: Self-pay | Admitting: Internal Medicine

## 2014-08-21 ENCOUNTER — Inpatient Hospital Stay (HOSPITAL_COMMUNITY)
Admission: EM | Admit: 2014-08-21 | Discharge: 2014-08-25 | DRG: 291 | Disposition: A | Discharge status: Skilled Nursing Facility | Payer: Medicare Other | Attending: Internal Medicine | Admitting: Internal Medicine

## 2014-08-21 ENCOUNTER — Encounter (HOSPITAL_COMMUNITY): Payer: Self-pay | Admitting: Emergency Medicine

## 2014-08-21 ENCOUNTER — Emergency Department (HOSPITAL_COMMUNITY): Payer: Medicare Other

## 2014-08-21 DIAGNOSIS — I272 Other secondary pulmonary hypertension: Secondary | ICD-10-CM | POA: Diagnosis present

## 2014-08-21 DIAGNOSIS — Z950 Presence of cardiac pacemaker: Secondary | ICD-10-CM | POA: Diagnosis present

## 2014-08-21 DIAGNOSIS — Z96619 Presence of unspecified artificial shoulder joint: Secondary | ICD-10-CM | POA: Diagnosis present

## 2014-08-21 DIAGNOSIS — Z681 Body mass index (BMI) 19 or less, adult: Secondary | ICD-10-CM

## 2014-08-21 DIAGNOSIS — Z96649 Presence of unspecified artificial hip joint: Secondary | ICD-10-CM | POA: Diagnosis present

## 2014-08-21 DIAGNOSIS — E780 Pure hypercholesterolemia: Secondary | ICD-10-CM | POA: Diagnosis present

## 2014-08-21 DIAGNOSIS — Z961 Presence of intraocular lens: Secondary | ICD-10-CM | POA: Diagnosis present

## 2014-08-21 DIAGNOSIS — J189 Pneumonia, unspecified organism: Secondary | ICD-10-CM | POA: Diagnosis present

## 2014-08-21 DIAGNOSIS — I2510 Atherosclerotic heart disease of native coronary artery without angina pectoris: Secondary | ICD-10-CM | POA: Diagnosis present

## 2014-08-21 DIAGNOSIS — Z9842 Cataract extraction status, left eye: Secondary | ICD-10-CM

## 2014-08-21 DIAGNOSIS — I5043 Acute on chronic combined systolic (congestive) and diastolic (congestive) heart failure: Principal | ICD-10-CM | POA: Diagnosis present

## 2014-08-21 DIAGNOSIS — K219 Gastro-esophageal reflux disease without esophagitis: Secondary | ICD-10-CM | POA: Diagnosis present

## 2014-08-21 DIAGNOSIS — R945 Abnormal results of liver function studies: Secondary | ICD-10-CM

## 2014-08-21 DIAGNOSIS — Z66 Do not resuscitate: Secondary | ICD-10-CM | POA: Diagnosis present

## 2014-08-21 DIAGNOSIS — Z87891 Personal history of nicotine dependence: Secondary | ICD-10-CM

## 2014-08-21 DIAGNOSIS — Z23 Encounter for immunization: Secondary | ICD-10-CM

## 2014-08-21 DIAGNOSIS — Z96653 Presence of artificial knee joint, bilateral: Secondary | ICD-10-CM | POA: Diagnosis present

## 2014-08-21 DIAGNOSIS — I252 Old myocardial infarction: Secondary | ICD-10-CM | POA: Diagnosis not present

## 2014-08-21 DIAGNOSIS — R7989 Other specified abnormal findings of blood chemistry: Secondary | ICD-10-CM

## 2014-08-21 DIAGNOSIS — Z79899 Other long term (current) drug therapy: Secondary | ICD-10-CM

## 2014-08-21 DIAGNOSIS — R0602 Shortness of breath: Secondary | ICD-10-CM

## 2014-08-21 DIAGNOSIS — M810 Age-related osteoporosis without current pathological fracture: Secondary | ICD-10-CM | POA: Diagnosis present

## 2014-08-21 DIAGNOSIS — J9601 Acute respiratory failure with hypoxia: Secondary | ICD-10-CM | POA: Diagnosis present

## 2014-08-21 DIAGNOSIS — N183 Chronic kidney disease, stage 3 unspecified: Secondary | ICD-10-CM

## 2014-08-21 DIAGNOSIS — D631 Anemia in chronic kidney disease: Secondary | ICD-10-CM | POA: Diagnosis present

## 2014-08-21 DIAGNOSIS — F419 Anxiety disorder, unspecified: Secondary | ICD-10-CM | POA: Diagnosis present

## 2014-08-21 DIAGNOSIS — I480 Paroxysmal atrial fibrillation: Secondary | ICD-10-CM | POA: Diagnosis present

## 2014-08-21 DIAGNOSIS — E440 Moderate protein-calorie malnutrition: Secondary | ICD-10-CM | POA: Diagnosis present

## 2014-08-21 DIAGNOSIS — E119 Type 2 diabetes mellitus without complications: Secondary | ICD-10-CM | POA: Diagnosis present

## 2014-08-21 DIAGNOSIS — Z8673 Personal history of transient ischemic attack (TIA), and cerebral infarction without residual deficits: Secondary | ICD-10-CM

## 2014-08-21 DIAGNOSIS — M1990 Unspecified osteoarthritis, unspecified site: Secondary | ICD-10-CM | POA: Diagnosis present

## 2014-08-21 DIAGNOSIS — I495 Sick sinus syndrome: Secondary | ICD-10-CM

## 2014-08-21 DIAGNOSIS — I129 Hypertensive chronic kidney disease with stage 1 through stage 4 chronic kidney disease, or unspecified chronic kidney disease: Secondary | ICD-10-CM | POA: Diagnosis present

## 2014-08-21 DIAGNOSIS — I509 Heart failure, unspecified: Secondary | ICD-10-CM

## 2014-08-21 DIAGNOSIS — Z9841 Cataract extraction status, right eye: Secondary | ICD-10-CM | POA: Diagnosis not present

## 2014-08-21 DIAGNOSIS — R0902 Hypoxemia: Secondary | ICD-10-CM

## 2014-08-21 DIAGNOSIS — Z7982 Long term (current) use of aspirin: Secondary | ICD-10-CM

## 2014-08-21 DIAGNOSIS — K8062 Calculus of gallbladder and bile duct with acute cholecystitis without obstruction: Secondary | ICD-10-CM | POA: Diagnosis present

## 2014-08-21 DIAGNOSIS — J9621 Acute and chronic respiratory failure with hypoxia: Secondary | ICD-10-CM | POA: Diagnosis present

## 2014-08-21 DIAGNOSIS — K8000 Calculus of gallbladder with acute cholecystitis without obstruction: Secondary | ICD-10-CM

## 2014-08-21 DIAGNOSIS — K819 Cholecystitis, unspecified: Secondary | ICD-10-CM

## 2014-08-21 DIAGNOSIS — Z853 Personal history of malignant neoplasm of breast: Secondary | ICD-10-CM | POA: Diagnosis not present

## 2014-08-21 DIAGNOSIS — D638 Anemia in other chronic diseases classified elsewhere: Secondary | ICD-10-CM | POA: Diagnosis present

## 2014-08-21 DIAGNOSIS — K810 Acute cholecystitis: Secondary | ICD-10-CM

## 2014-08-21 LAB — CBC WITH DIFFERENTIAL/PLATELET
BASOS ABS: 0 10*3/uL (ref 0.0–0.1)
Basophils Relative: 0 % (ref 0–1)
EOS PCT: 0 % (ref 0–5)
Eosinophils Absolute: 0 10*3/uL (ref 0.0–0.7)
HCT: 33.3 % — ABNORMAL LOW (ref 36.0–46.0)
Hemoglobin: 10.6 g/dL — ABNORMAL LOW (ref 12.0–15.0)
Lymphocytes Relative: 10 % — ABNORMAL LOW (ref 12–46)
Lymphs Abs: 0.8 10*3/uL (ref 0.7–4.0)
MCH: 27.7 pg (ref 26.0–34.0)
MCHC: 31.8 g/dL (ref 30.0–36.0)
MCV: 87.2 fL (ref 78.0–100.0)
MONOS PCT: 9 % (ref 3–12)
Monocytes Absolute: 0.7 10*3/uL (ref 0.1–1.0)
NEUTROS PCT: 81 % — AB (ref 43–77)
Neutro Abs: 6.4 10*3/uL (ref 1.7–7.7)
PLATELETS: 281 10*3/uL (ref 150–400)
RBC: 3.82 MIL/uL — ABNORMAL LOW (ref 3.87–5.11)
RDW: 16.6 % — ABNORMAL HIGH (ref 11.5–15.5)
WBC: 7.9 10*3/uL (ref 4.0–10.5)

## 2014-08-21 LAB — COMPREHENSIVE METABOLIC PANEL
ALT: 53 U/L — AB (ref 0–35)
AST: 105 U/L — AB (ref 0–37)
Albumin: 3.4 g/dL — ABNORMAL LOW (ref 3.5–5.2)
Alkaline Phosphatase: 223 U/L — ABNORMAL HIGH (ref 39–117)
Anion gap: 16 — ABNORMAL HIGH (ref 5–15)
BILIRUBIN TOTAL: 0.9 mg/dL (ref 0.3–1.2)
BUN: 36 mg/dL — ABNORMAL HIGH (ref 6–23)
CO2: 20 mEq/L (ref 19–32)
CREATININE: 1.59 mg/dL — AB (ref 0.50–1.10)
Calcium: 9.1 mg/dL (ref 8.4–10.5)
Chloride: 103 mEq/L (ref 96–112)
GFR calc Af Amer: 33 mL/min — ABNORMAL LOW (ref >90)
GFR, EST NON AFRICAN AMERICAN: 28 mL/min — AB (ref >90)
Glucose, Bld: 204 mg/dL — ABNORMAL HIGH (ref 70–99)
Potassium: 4.9 mEq/L (ref 3.7–5.3)
Sodium: 139 mEq/L (ref 137–147)
Total Protein: 6 g/dL (ref 6.0–8.3)

## 2014-08-21 LAB — PRO B NATRIURETIC PEPTIDE
PRO B NATRI PEPTIDE: 42122 pg/mL — AB (ref 0–450)
Pro B Natriuretic peptide (BNP): 37601 pg/mL — ABNORMAL HIGH (ref 0–450)

## 2014-08-21 LAB — GLUCOSE, CAPILLARY: Glucose-Capillary: 184 mg/dL — ABNORMAL HIGH (ref 70–99)

## 2014-08-21 LAB — TROPONIN I: Troponin I: <0.3 ng/mL (ref <0.30)

## 2014-08-21 MED ORDER — CETYLPYRIDINIUM CHLORIDE 0.05 % MT LIQD
7.0000 mL | Freq: Two times a day (BID) | OROMUCOSAL | Status: DC
  Administered 2014-08-21 – 2014-08-25 (×7): 7 mL via OROMUCOSAL

## 2014-08-21 MED ORDER — DOCUSATE SODIUM 100 MG PO CAPS
100.0000 mg | ORAL_CAPSULE | Freq: Two times a day (BID) | ORAL | Status: DC
  Administered 2014-08-21 – 2014-08-25 (×7): 100 mg via ORAL
  Filled 2014-08-21 (×10): qty 1

## 2014-08-21 MED ORDER — ALPRAZOLAM 0.25 MG PO TABS
0.2500 mg | ORAL_TABLET | Freq: Every day | ORAL | Status: DC | PRN
  Administered 2014-08-24: 0.25 mg via ORAL
  Filled 2014-08-21: qty 1

## 2014-08-21 MED ORDER — ACETAMINOPHEN 325 MG PO TABS
650.0000 mg | ORAL_TABLET | ORAL | Status: DC | PRN
  Administered 2014-08-24: 650 mg via ORAL
  Filled 2014-08-21: qty 2

## 2014-08-21 MED ORDER — DICLOFENAC SODIUM 1 % TD GEL: 2.0000 g | Freq: Four times a day (QID) | TRANSDERMAL | Status: DC | PRN

## 2014-08-21 MED ORDER — SPIRONOLACTONE 25 MG PO TABS
25.0000 mg | ORAL_TABLET | Freq: Every day | ORAL | Status: DC
  Administered 2014-08-22 – 2014-08-25 (×3): 25 mg via ORAL
  Filled 2014-08-21 (×4): qty 1

## 2014-08-21 MED ORDER — ONDANSETRON HCL 4 MG/2ML IJ SOLN: 4.0000 mg | Freq: Four times a day (QID) | INTRAMUSCULAR | Status: DC | PRN

## 2014-08-21 MED ORDER — INFLUENZA VAC SPLIT QUAD 0.5 ML IM SUSY
0.5000 mL | PREFILLED_SYRINGE | INTRAMUSCULAR | Status: AC
  Administered 2014-08-22: 0.5 mL via INTRAMUSCULAR
  Filled 2014-08-21 (×2): qty 0.5

## 2014-08-21 MED ORDER — SODIUM CHLORIDE 0.9 % IJ SOLN: 3.0000 mL | Freq: Two times a day (BID) | INTRAMUSCULAR | Status: DC

## 2014-08-21 MED ORDER — SODIUM CHLORIDE 0.9 % IJ SOLN: 3.0000 mL | INTRAMUSCULAR | Status: DC | PRN

## 2014-08-21 MED ORDER — INSULIN ASPART 100 UNIT/ML ~~LOC~~ SOLN
0.0000 [IU] | Freq: Three times a day (TID) | SUBCUTANEOUS | Status: DC
  Administered 2014-08-22 – 2014-08-25 (×3): 1 [IU] via SUBCUTANEOUS

## 2014-08-21 MED ORDER — MAGIC MOUTHWASH
10.0000 mL | Freq: Three times a day (TID) | ORAL | Status: DC | PRN
  Administered 2014-08-23: 10 mL via ORAL
  Filled 2014-08-21: qty 10

## 2014-08-21 MED ORDER — ASPIRIN EC 81 MG PO TBEC
81.0000 mg | DELAYED_RELEASE_TABLET | Freq: Every day | ORAL | Status: DC
  Administered 2014-08-21 – 2014-08-25 (×4): 81 mg via ORAL
  Filled 2014-08-21 (×5): qty 1

## 2014-08-21 MED ORDER — METOPROLOL TARTRATE 50 MG PO TABS
50.0000 mg | ORAL_TABLET | Freq: Two times a day (BID) | ORAL | Status: DC
  Administered 2014-08-21 – 2014-08-25 (×7): 50 mg via ORAL
  Filled 2014-08-21 (×9): qty 1

## 2014-08-21 MED ORDER — ACETAMINOPHEN 500 MG PO TABS: 500.0000 mg | ORAL_TABLET | Freq: Four times a day (QID) | ORAL | Status: DC | PRN

## 2014-08-21 MED ORDER — SODIUM CHLORIDE 0.9 % IV SOLN
250.0000 mL | INTRAVENOUS | Status: DC | PRN
  Administered 2014-08-23 – 2014-08-24 (×2): 250 mL via INTRAVENOUS

## 2014-08-21 MED ORDER — PANTOPRAZOLE SODIUM 40 MG PO TBEC
40.0000 mg | DELAYED_RELEASE_TABLET | Freq: Every day | ORAL | Status: DC
  Administered 2014-08-22 – 2014-08-25 (×3): 40 mg via ORAL
  Filled 2014-08-21 (×5): qty 1

## 2014-08-21 MED ORDER — FUROSEMIDE 10 MG/ML IJ SOLN
40.0000 mg | Freq: Every day | INTRAMUSCULAR | Status: DC
  Administered 2014-08-21: 40 mg via INTRAVENOUS
  Filled 2014-08-21 (×2): qty 4

## 2014-08-21 MED ORDER — LIP MEDEX EX OINT
TOPICAL_OINTMENT | CUTANEOUS | Status: DC | PRN
Start: 2014-08-21 — End: 2014-08-25
  Filled 2014-08-21: qty 7

## 2014-08-21 NOTE — ED Notes (Signed)
Bed: WA10 Expected date:  Expected time:  Means of arrival:  Comments: EMS-weakness 

## 2014-08-21 NOTE — ED Notes (Signed)
She remains in no distress.  Her son's phone number "Rush Landmark" is [phone] (cell) [phone] (home).  He prefers to be phoned for updates, which he relays to his father, who is h.o.h.

## 2014-08-21 NOTE — Progress Notes (Signed)
Pt transported to [facility]. VSS at this time. Family at bedside. No distress noted. Will continue to monitor pt closely and carry out POC. Sophia Oliver I

## 2014-08-21 NOTE — ED Notes (Signed)
Attempted report times one,  Per Maebelle Munroe RN  Nurse will return call on 1300 for report

## 2014-08-21 NOTE — ED Provider Notes (Signed)
Medical screening examination/treatment/procedure(s) were conducted as a shared visit with non-physician practitioner(s) and myself.  I personally evaluated the patient during the encounter.  Pt presents with generalized weakness, and SOB. On PE, she has is in NAD, dec breath sounds at bases. Abdominal exam and neuro exam benign. BNP elevated at 37000. CXR with cardiomegaly and persistent BL effusions. She is persistently hypoxic on her home 3L, is requiring 4L. [facility] will admit for CHF exacerbation.     EKG Interpretation None      ECG:  Date: 08/21/2014  Rate: 65  Rhythm: normal sinus rhythm  QRS Axis: left  Intervals: normal  ST/T Wave abnormalities: normal  Conduction Disutrbances:none  Narrative Interpretation: paced rhythm, nonischemic ECG  Old EKG Reviewed: unchanged   Ernestina Patches, MD 08/21/14 2342

## 2014-08-21 NOTE — ED Notes (Signed)
She states she was recently treated at a local rehab center; and was able to go home.  She c/o recurrent weakness and shortness of breath.  She is alert and in no distress.  She theorizes that she "might have pneumonia again".

## 2014-08-21 NOTE — ED Provider Notes (Signed)
CSN: [id number]     Arrival date & time 08/21/14  1621 History   First MD Initiated Contact with Patient 08/21/14 1624     Chief Complaint  Patient presents with  . Weakness     (Consider location/radiation/quality/duration/timing/severity/associated sxs/prior Treatment) The history is provided by the patient, medical records and the EMS personnel. No language interpreter was used.    NIAMH RADA is a 80 y.o. female  with a hx of recurrent PNA, anxiety, CKD, a-fib, CHF,  NIDDM, pacemaker, HTN presents to the Emergency Department complaining of gradual, persistent, progressively worsening generalized weakness with associated SOB onset 4 days ago after being released home from a rehab facility where she was for the last month due to decompensation from a bout of penumonia.  EMS reports that husband at home reports that he cannot care for her as needed at home.  Pt's oxygen saturations found by EMS to be 69% on her 3L via [place].  Pt wears this all the time at home.  Pt improved with 4L oxygen via [place]. No aggravating or alleviating factors.  Pt reports a significant weight loss in the last month of almost 25 lbs.  Pt denies fever, chills, headache, neck pain, CP, abd pain, N/V/D, syncope, dysuria.       Past Medical History  Diagnosis Date  . Syncope   . Sinus node dysfunction     a. s/p pacemaker.  . Wide-complex tachycardia   . Cholelithiasis   . Glucose intolerance (impaired glucose tolerance)   . Anxiety   . PONV (postoperative nausea and vomiting)   . Chronic kidney disease     a. Baseline Cr reported 1.8-2.0.  . Injury of right upper arm     after venipuncture in 3/13 - pt describes pain afterwards- no followup done seen by PCP- no further treatment   . PAF (paroxysmal atrial fibrillation)   . Left bundle branch block   . Tachycardia-bradycardia syndrome   . CHF (congestive heart failure)     a. EF 30-35% by echo 2015.  [name] Pacemaker     Guidant, implanted 2006  . Type II diabetes  mellitus     "recently dx'd" (03/09/2014)  . Breast cancer 2003    "radiation on right; mastectomy on the left"  . Hypertension   . GERD (gastroesophageal reflux disease)   . DJD (degenerative joint disease)   . Arthritis     "real bad in my lower back" (03/09/2014)  . Chronic lower back pain   . Orthostatic hypotension     a. Diuretics d/c'd 03/2014.  [name] PAT (paroxysmal atrial tachycardia)    Past Surgical History  Procedure Laterality Date  . Replacement total knee bilateral Bilateral   . Total shoulder replacement    . Tonsillectomy    . Orif distal femur fracture Right 12/2007  . Tonsillectomy    . Mastectomy Left 2003  . Total hip arthroplasty  06/16/2012    Procedure: TOTAL HIP ARTHROPLASTY;  Surgeon: Mauri Pole, MD;  Location: [facility];  Service: Orthopedics;  Laterality: Left;  . Hip closed reduction  06/30/2012    Procedure: CLOSED MANIPULATION HIP;  Surgeon: Mauri Pole, MD;  Location: [facility];  Service: Orthopedics;  Laterality: Left;  . Abdominal hysterectomy    . Pacemaker insertion      Guidant Insignia I  . Cataract extraction w/ intraocular lens  implant, bilateral Bilateral 2000's  . Refractive surgery Bilateral 01/2014  . Inguinal hernia repair Left     "  had emergency OR for it a few years ago" (03/09/2014)  . Joint replacement    . Breast biopsy Bilateral    Family History  Problem Relation Age of Onset  . Hypertension     History  Substance Use Topics  . Smoking status: Former Smoker -- 0.50 packs/day for 6 years    Types: Cigarettes  . Smokeless tobacco: Never Used  . Alcohol Use: Yes     Comment: "I'll have a social drink maybe at Chrismastime"   OB History   Grav Para Term Preterm Abortions TAB SAB Ect Mult Living                 Review of Systems  Constitutional: Negative for fever, diaphoresis, appetite change, fatigue and unexpected weight change.  HENT: Negative for mouth sores.   Eyes: Negative for visual disturbance.  Respiratory: Positive  for shortness of breath. Negative for cough, chest tightness and wheezing.   Cardiovascular: Negative for chest pain.  Gastrointestinal: Negative for nausea, vomiting, abdominal pain, diarrhea and constipation.  Endocrine: Negative for polydipsia, polyphagia and polyuria.  Genitourinary: Negative for dysuria, urgency, frequency and hematuria.  Musculoskeletal: Negative for back pain and neck stiffness.  Skin: Negative for rash.  Allergic/Immunologic: Negative for immunocompromised state.  Neurological: Positive for weakness (generalized). Negative for syncope, light-headedness and headaches.  Hematological: Does not bruise/bleed easily.  Psychiatric/Behavioral: Negative for sleep disturbance. The patient is not nervous/anxious.       Allergies  Codeine; Hydrocodone-acetaminophen; and Other  Home Medications   Prior to Admission medications   Medication Sig Start Date End Date Taking? Authorizing Provider  acetaminophen (TYLENOL) 500 MG tablet Take 500 mg by mouth every 6 (six) hours as needed (joint pain).   Yes Historical Provider, MD  ALPRAZolam Duanne Moron) 0.25 MG tablet Take one tablet by mouth twice daily for anxiety 05/29/14  Yes Theodis Blaze, MD  Alum & Mag Hydroxide-Simeth (MAGIC MOUTHWASH) SOLN Take 10 mLs by mouth 3 (three) times daily as needed for mouth pain (mouth pain).   Yes Historical Provider, MD  aspirin 81 MG chewable tablet Chew 1 tablet (81 mg total) by mouth daily. 06/30/14  Yes Costin Karlyne Greenspan, MD  diclofenac sodium (VOLTAREN) 1 % GEL Apply 2 g topically 4 (four) times daily as needed (joint pain).   Yes Historical Provider, MD  docusate sodium 100 MG CAPS Take 100 mg by mouth 2 (two) times daily. 05/29/14  Yes Theodis Blaze, MD  metoprolol (LOPRESSOR) 50 MG tablet Take 1.5 tablets (75 mg total) by mouth 2 (two) times daily. 08/02/14  Yes Deboraha Sprang, MD  pantoprazole (PROTONIX) 40 MG tablet Take 40 mg by mouth daily.   Yes Historical Provider, MD  spironolactone  (ALDACTONE) 25 MG tablet Take 25 mg by mouth daily. 05/19/14  Yes Historical Provider, MD   BP 143/75  Pulse 65  Temp(Src) 98 F (36.7 C) (Oral)  Resp 26  SpO2 99% Physical Exam  Nursing note and vitals reviewed. Constitutional: She is oriented to person, place, and time. She appears well-developed and well-nourished. No distress.  Awake, alert, nontoxic appearance  HENT:  Head: Normocephalic and atraumatic.  Right Ear: Tympanic membrane, external ear and ear canal normal.  Left Ear: Tympanic membrane, external ear and ear canal normal.  Nose: Nose normal. No epistaxis. Right sinus exhibits no maxillary sinus tenderness and no frontal sinus tenderness. Left sinus exhibits no maxillary sinus tenderness and no frontal sinus tenderness.  Mouth/Throat: Uvula is midline, oropharynx is  clear and moist and mucous membranes are normal. Mucous membranes are not pale and not cyanotic. No oropharyngeal exudate, posterior oropharyngeal edema, posterior oropharyngeal erythema or tonsillar abscesses.  Eyes: Conjunctivae and EOM are normal. Pupils are equal, round, and reactive to light. No scleral icterus.  Neck: Normal range of motion and full passive range of motion without pain. Neck supple.  Cardiovascular: Normal rate, regular rhythm, normal heart sounds and intact distal pulses.   Pulmonary/Chest: No accessory muscle usage or stridor. Tachypnea noted. No respiratory distress. She has decreased breath sounds. She has no wheezes.  Equal chest expansion Tachypnea Diminished breath sounds throughout No focal wheezes, rhonchi or rales  Abdominal: Soft. Bowel sounds are normal. She exhibits no mass. There is no tenderness. There is no rebound and no guarding.  abd soft and nontender  Musculoskeletal: Normal range of motion. She exhibits no edema.  Lymphadenopathy:    She has no cervical adenopathy.  Neurological: She is alert and oriented to person, place, and time. She exhibits normal muscle tone.  Coordination normal.  Speech is clear and goal oriented Moves extremities without ataxia  Skin: Skin is warm and dry. No rash noted. She is not diaphoretic. There is pallor.  Pale appearing  Psychiatric: She has a normal mood and affect.    ED Course  Procedures (including critical care time) Labs Review Labs Reviewed  CBC WITH DIFFERENTIAL - Abnormal; Notable for the following:    RBC 3.82 (*)    Hemoglobin 10.6 (*)    HCT 33.3 (*)    RDW 16.6 (*)    Neutrophils Relative % 81 (*)    Lymphocytes Relative 10 (*)    All other components within normal limits  COMPREHENSIVE METABOLIC PANEL - Abnormal; Notable for the following:    Glucose, Bld 204 (*)    BUN 36 (*)    Creatinine, Ser 1.59 (*)    Albumin 3.4 (*)    AST 105 (*)    ALT 53 (*)    Alkaline Phosphatase 223 (*)    GFR calc non Af Amer 28 (*)    GFR calc Af Amer 33 (*)    Anion gap 16 (*)    All other components within normal limits  PRO B NATRIURETIC PEPTIDE - Abnormal; Notable for the following:    Pro B Natriuretic peptide (BNP) 37601.0 (*)    All other components within normal limits  TROPONIN I  URINALYSIS, ROUTINE W REFLEX MICROSCOPIC    Imaging Review Dg Chest 2 View  08/21/2014   CLINICAL DATA:  Shortness of breath and cough for 1 month.  EXAM: CHEST  2 VIEW  COMPARISON:  PA and lateral chest 06/29/2014.  FINDINGS: Left worse than right effusions and airspace disease persist. Aeration right lung base appears slightly improved. There is cardiomegaly. No pneumothorax. Pacing device remains in place. Right shoulder replacement is noted. Severe degenerative disease left shoulder is unchanged.  IMPRESSION: Persistent left worse than right effusions and airspace disease. Aeration in the right lung base appears slightly improved. No new abnormality.  Cardiomegaly.   Electronically Signed   By: Inge Rise M.D.   On: 08/21/2014 17:35     EKG Interpretation None      ECG:  Date: 08/21/2014  Rate: 65   Rhythm: normal sinus rhythm  QRS Axis: left  Intervals: normal  ST/T Wave abnormalities: normal  Conduction Disutrbances:none  Narrative Interpretation: paced rhythm, nonischemic ECG  Old EKG Reviewed: unchanged    MDM   Final  diagnoses:  CHF exacerbation  Hypoxia  Anxiety  Malnutrition of moderate degree  Tachy-brady syndrome  Pacemaker   Marquis Buggy presents with SOB and generalized weakness for several days.  Hx of recurrent PNA requiring hospitalization approx 1 month ago.  Pt with picture of likely recurrent PNA, but also with some failure to thrive.  Pt hypoxic on 3L oxygen, but maintains oxygen saturations on 4L.  Pt resting comfortably with some increased work of breathing.  She denies pain, falls at home or other complaints.    6:03 PM No leukocytosis however patient with elevated BNP of greater than twice her levels in her last admission. Troponin negative and EKG without ischemia, paced rhythm. Chest x-ray with persistent left worse than right effusions and airspace disease.  Patient continues to be hypoxic when oxygen is decreased.    6:12 PM Discussed with Dr. Charlies Silvers who will admit to tele.    The patient was discussed with and seen by Dr. Tawnya Crook who agrees with the treatment plan.  BP 143/75  Pulse 65  Temp(Src) 98 F (36.7 C) (Oral)  Resp 26  SpO2 99%    Abigail Butts, PA-C 08/21/14 1815

## 2014-08-21 NOTE — ED Notes (Signed)
Report given to floor at 8 pm,  Pt, husband and family at bedside aware,  But pt doesn't want to go to floor until speaking with ED  MD and Dr aware,  Also called floor to make aware of delay

## 2014-08-21 NOTE — H&P (Signed)
[facility] History and Physical  Sophia Oliver ZOX:[id number] DOB: [birth year] DOA: 08/21/2014  Referring physician: ER physician PCP: [name], MD   Chief Complaint: shortness of breath   HPI:  80 year old female with past medical history of systolic and diastolic CHF (last 2 D ECHO in 03/2014 with ED of 15-20%), atrial fibrillation, pacemaker, CKD stage 3, recently discharge from SNF who presented to [facility] ED 08/21/2014 with worsening shortness of breath, weakness for past few days prior to this admission. Patient reported being more short of breath with movement / exertion but experiencing shortness of breath at rest as well. No complains of chest pain, palpitations. No cough. No fevers or chills. Additionally, on arrival patient was found to have hypoxia and even with 3 L [place] oxygen support she remained hypoxic. No complaints of abdominal pain, nausea or vomiting. No diarrhea or constipation. No lightheadedness or loss of consciousness.  In ED, oxygen saturation improved with 4 L [place] oxygen support. BP was 143/75, HR 65, T max 98 F. Blood work revealed hemoglobin of 10.6, creatinine 0.59, normal troponin level. BNP was 37,600. The 12 lead EKG shoed paced rhythm. She was given a bolus of lasix 40 mg IV. CXR showed persistent left worse than right effusions and airspace disease, no new abnormality; cardiomegaly. Pt was admitted for CHF exacerbation.  Assessment & Plan    Principal Problem:   Acute respiratory failure with hypoxia / Acute on chronic combined systolic and diastolic CHF (congestive heart failure)  Respiratory status improved while on 4 L [place] oxygen support.  Acute CHF order set placed. Patient started on lasix 40 mg IV daily.  Patient already on BB at home, metoprolol (will reduce dose from 75 to 50 mg PO BID). No ACEi due to renal insufficiency.  Continue aspirin. Continue spironolactone.   Last 2 D ECHO in 03/2014 with EF of 15-20%.   Continue to monitor intake and  output, daily weight, replete electrolytes as needed.  Monitor on telemetry.  Active Problems:   CKD (chronic kidney disease), stage III  Recent baseline creatinine 1.5 - 1.7 so admission creatinine value in baseline range.   Monitor renal function since we started lasix.   Pacemaker  Stable.   Malnutrition of moderate degree  Nutrition consulted.    Anemia of chronic disease  Likely due to anemia of chronic kidney disease  Hemoglobin stable. No current indications for transfusion.    GERD (gastroesophageal reflux disease)  Continue protonix.  DVT prophylaxis:   SCD's bilaterally   Radiological Exams on Admission: Dg Chest 2 View 08/21/2014    Persistent left worse than right effusions and airspace disease. Aeration in the right lung base appears slightly improved. No new abnormality.  Cardiomegaly.   Electronically Signed   By: Inge Rise M.D.   On: 08/21/2014 17:35    EKG: paced rhythm   Code Status: Full Family Communication: family not at the bedside  Disposition Plan: Admit for further evaluation  Leisa Lenz, MD  [facility] Pager [phone]  Review of Systems:  Constitutional: Negative for fever, chills and malaise/fatigue. Negative for diaphoresis.  HENT: Negative for hearing loss, ear pain, nosebleeds, congestion, sore throat, neck pain, tinnitus and ear discharge.   Eyes: Negative for blurred vision, double vision, photophobia, pain, discharge and redness.  Respiratory: Negative for cough, hemoptysis, sputum production, shortness of breath, wheezing and stridor.   Cardiovascular: per HPI Gastrointestinal: Negative for nausea, vomiting and abdominal pain. Negative for heartburn, constipation, blood in stool  and melena.  Genitourinary: Negative for dysuria, urgency, frequency, hematuria and flank pain.  Musculoskeletal: Negative for myalgias, back pain, joint pain and falls.  Skin: Negative for itching and rash.  Neurological: Negative for  dizziness and weakness. Negative for tingling, tremors, sensory change, speech change, focal weakness, loss of consciousness and headaches.  Endo/Heme/Allergies: Negative for environmental allergies and polydipsia. Does not bruise/bleed easily.  Psychiatric/Behavioral: Negative for suicidal ideas. The patient is not nervous/anxious.      Past Medical History  Diagnosis Date  . Syncope   . Sinus node dysfunction     a. s/p pacemaker.  . Wide-complex tachycardia   . Cholelithiasis   . Glucose intolerance (impaired glucose tolerance)   . Anxiety   . PONV (postoperative nausea and vomiting)   . Chronic kidney disease     a. Baseline Cr reported 1.8-2.0.  . Injury of right upper arm     after venipuncture in 3/13 - pt describes pain afterwards- no followup done seen by PCP- no further treatment   . PAF (paroxysmal atrial fibrillation)   . Left bundle branch block   . Tachycardia-bradycardia syndrome   . CHF (congestive heart failure)     a. EF 30-35% by echo 2015.  [name] Pacemaker     Guidant, implanted 2006  . Type II diabetes mellitus     "recently dx'd" (03/09/2014)  . Breast cancer 2003    "radiation on right; mastectomy on the left"  . Hypertension   . GERD (gastroesophageal reflux disease)   . DJD (degenerative joint disease)   . Arthritis     "real bad in my lower back" (03/09/2014)  . Chronic lower back pain   . Orthostatic hypotension     a. Diuretics d/c'd 03/2014.  [name] PAT (paroxysmal atrial tachycardia)    Past Surgical History  Procedure Laterality Date  . Replacement total knee bilateral Bilateral   . Total shoulder replacement    . Tonsillectomy    . Orif distal femur fracture Right 12/2007  . Tonsillectomy    . Mastectomy Left 2003  . Total hip arthroplasty  06/16/2012    Procedure: TOTAL HIP ARTHROPLASTY;  Surgeon: Mauri Pole, MD;  Location: [facility];  Service: Orthopedics;  Laterality: Left;  . Hip closed reduction  06/30/2012    Procedure: CLOSED MANIPULATION HIP;   Surgeon: Mauri Pole, MD;  Location: [facility];  Service: Orthopedics;  Laterality: Left;  . Abdominal hysterectomy    . Pacemaker insertion      Guidant Insignia I  . Cataract extraction w/ intraocular lens  implant, bilateral Bilateral 2000's  . Refractive surgery Bilateral 01/2014  . Inguinal hernia repair Left     "had emergency OR for it a few years ago" (03/09/2014)  . Joint replacement    . Breast biopsy Bilateral    Social History:  reports that she has quit smoking. Her smoking use included Cigarettes. She has a 3 pack-year smoking history. She has never used smokeless tobacco. She reports that she drinks alcohol. She reports that she does not use illicit drugs.  Allergies  Allergen Reactions  . Codeine Nausea And Vomiting  . Hydrocodone-Acetaminophen Itching  . Other Nausea And Vomiting    Narcotics  dizziness    Family History:  Family History  Problem Relation Age of Onset  . Hypertension       Prior to Admission medications   Medication Sig Start Date End Date Taking? Authorizing Provider  acetaminophen (TYLENOL) 500 MG tablet Take  500 mg by mouth every 6 (six) hours as needed (joint pain).   Yes Historical Provider, MD  ALPRAZolam Duanne Moron) 0.25 MG tablet Take one tablet by mouth twice daily for anxiety 05/29/14  Yes Theodis Blaze, MD  Alum & Mag Hydroxide-Simeth (MAGIC MOUTHWASH) SOLN Take 10 mLs by mouth 3 (three) times daily as needed for mouth pain (mouth pain).   Yes Historical Provider, MD  aspirin 81 MG chewable tablet Chew 1 tablet (81 mg total) by mouth daily. 06/30/14  Yes Costin Karlyne Greenspan, MD  diclofenac sodium (VOLTAREN) 1 % GEL Apply 2 g topically 4 (four) times daily as needed (joint pain).   Yes Historical Provider, MD  docusate sodium 100 MG CAPS Take 100 mg by mouth 2 (two) times daily. 05/29/14  Yes Theodis Blaze, MD  metoprolol (LOPRESSOR) 50 MG tablet Take 1.5 tablets (75 mg total) by mouth 2 (two) times daily. 08/02/14  Yes Deboraha Sprang, MD   pantoprazole (PROTONIX) 40 MG tablet Take 40 mg by mouth daily.   Yes Historical Provider, MD  spironolactone (ALDACTONE) 25 MG tablet Take 25 mg by mouth daily. 05/19/14  Yes Historical Provider, MD   Physical Exam: Filed Vitals:   08/21/14 1635 08/21/14 1851  BP: 143/75 144/73  Pulse: 65 65  Temp: 98 F (36.7 C)   TempSrc: Oral   Resp: 26 23  SpO2: 99% 100%    Physical Exam  Constitutional: Appears well-developed and well-nourished. No distress.  HENT: Normocephalic. No tonsillar erythema or exudates Eyes: Conjunctivae and EOM are normal. PERRLA, no scleral icterus.  Neck: Normal ROM. Neck supple. No JVD. No tracheal deviation. No thyromegaly.  CVS: rate controlled, S1/S2 appreciated, pacer(+) Pulmonary: diminished breath sounds, basilar crackles.  Abdominal: Soft. BS +,  no distension, tenderness, rebound or guarding.  Musculoskeletal: Normal range of motion. No edema and no tenderness.  Lymphadenopathy: No lymphadenopathy noted, cervical, inguinal. Neuro: Alert. Normal reflexes, muscle tone coordination. No focal neurologic deficits. Skin: Skin is warm and dry.  Psychiatric: Normal mood and affect. Behavior, judgment, thought content normal.   Labs on Admission:  Basic Metabolic Panel:  Recent Labs Lab 08/21/14 1655  NA 139  K 4.9  CL 103  CO2 20  GLUCOSE 204*  BUN 36*  CREATININE 1.59*  CALCIUM 9.1   Liver Function Tests:  Recent Labs Lab 08/21/14 1655  AST 105*  ALT 53*  ALKPHOS 223*  BILITOT 0.9  PROT 6.0  ALBUMIN 3.4*   No results found for this basename: LIPASE, AMYLASE,  in the last 168 hours No results found for this basename: AMMONIA,  in the last 168 hours CBC:  Recent Labs Lab 08/21/14 1655  WBC 7.9  NEUTROABS 6.4  HGB 10.6*  HCT 33.3*  MCV 87.2  PLT 281   Cardiac Enzymes:  Recent Labs Lab 08/21/14 1655  TROPONINI <0.30   BNP: No components found with this basename: POCBNP,  CBG: No results found for this basename: GLUCAP,   in the last 168 hours  If 7PM-7AM, please contact night-coverage [email or web address] Password [facility] 08/21/2014, 6:55 PM

## 2014-08-22 ENCOUNTER — Inpatient Hospital Stay (HOSPITAL_COMMUNITY): Payer: Medicare Other

## 2014-08-22 ENCOUNTER — Observation Stay (HOSPITAL_COMMUNITY): Payer: Medicare Other

## 2014-08-22 DIAGNOSIS — E440 Moderate protein-calorie malnutrition: Secondary | ICD-10-CM

## 2014-08-22 DIAGNOSIS — Z950 Presence of cardiac pacemaker: Secondary | ICD-10-CM

## 2014-08-22 DIAGNOSIS — I5043 Acute on chronic combined systolic (congestive) and diastolic (congestive) heart failure: Secondary | ICD-10-CM | POA: Diagnosis not present

## 2014-08-22 DIAGNOSIS — K8000 Calculus of gallbladder with acute cholecystitis without obstruction: Secondary | ICD-10-CM

## 2014-08-22 DIAGNOSIS — J9601 Acute respiratory failure with hypoxia: Secondary | ICD-10-CM

## 2014-08-22 DIAGNOSIS — I509 Heart failure, unspecified: Secondary | ICD-10-CM

## 2014-08-22 LAB — URINE MICROSCOPIC-ADD ON

## 2014-08-22 LAB — BASIC METABOLIC PANEL
ANION GAP: 16 — AB (ref 5–15)
BUN: 37 mg/dL — ABNORMAL HIGH (ref 6–23)
CALCIUM: 8.8 mg/dL (ref 8.4–10.5)
CHLORIDE: 103 meq/L (ref 96–112)
CO2: 20 meq/L (ref 19–32)
Creatinine, Ser: 1.8 mg/dL — ABNORMAL HIGH (ref 0.50–1.10)
GFR calc Af Amer: 28 mL/min — ABNORMAL LOW (ref >90)
GFR calc non Af Amer: 24 mL/min — ABNORMAL LOW (ref >90)
Glucose, Bld: 141 mg/dL — ABNORMAL HIGH (ref 70–99)
POTASSIUM: 4.7 meq/L (ref 3.7–5.3)
SODIUM: 139 meq/L (ref 137–147)

## 2014-08-22 LAB — GLUCOSE, CAPILLARY
GLUCOSE-CAPILLARY: 137 mg/dL — AB (ref 70–99)
Glucose-Capillary: 112 mg/dL — ABNORMAL HIGH (ref 70–99)
Glucose-Capillary: 92 mg/dL (ref 70–99)
Glucose-Capillary: 123 mg/dL — ABNORMAL HIGH (ref 70–99)

## 2014-08-22 LAB — URINALYSIS, ROUTINE W REFLEX MICROSCOPIC
BILIRUBIN URINE: NEGATIVE
GLUCOSE, UA: NEGATIVE mg/dL
HGB URINE DIPSTICK: NEGATIVE
KETONES UR: NEGATIVE mg/dL
Nitrite: NEGATIVE
PROTEIN: NEGATIVE mg/dL
Specific Gravity, Urine: 1.008 (ref 1.005–1.030)
Urobilinogen, UA: 0.2 mg/dL (ref 0.0–1.0)
pH: 5 (ref 5.0–8.0)

## 2014-08-22 LAB — TSH: TSH: 6.11 u[IU]/mL — AB (ref 0.350–4.500)

## 2014-08-22 MED ORDER — TECHNETIUM TC 99M MEBROFENIN IV KIT
5.5000 | PACK | Freq: Once | INTRAVENOUS | Status: AC | PRN
  Administered 2014-08-22: 5.5 via INTRAVENOUS

## 2014-08-22 MED ORDER — SODIUM CHLORIDE 0.9 % IV SOLN
1.5000 g | Freq: Two times a day (BID) | INTRAVENOUS | Status: DC
  Administered 2014-08-22 – 2014-08-25 (×7): 1.5 g via INTRAVENOUS
  Filled 2014-08-22 (×9): qty 1.5

## 2014-08-22 MED ORDER — FUROSEMIDE 40 MG PO TABS
40.0000 mg | ORAL_TABLET | Freq: Every day | ORAL | Status: DC
  Administered 2014-08-22 – 2014-08-25 (×3): 40 mg via ORAL
  Filled 2014-08-22 (×4): qty 1

## 2014-08-22 MED ORDER — LOSARTAN POTASSIUM 25 MG PO TABS
25.0000 mg | ORAL_TABLET | Freq: Every day | ORAL | Status: DC
  Administered 2014-08-22 – 2014-08-25 (×3): 25 mg via ORAL
  Filled 2014-08-22 (×5): qty 1

## 2014-08-22 NOTE — Consult Note (Signed)
[facility] Surgery Consult Note  Sophia Oliver 10-30-1924  [id number].    Requesting MD: Dr. Rhetta Mura Chief Complaint/Reason for Consult: RUQ abdominal pain  HPI:  80 year old female with PMH of systolic and diastolic CHF (last 2 D ECHO in 03/2014 with ED of 15-20%), AFIB, pacemaker, CKD stage 3, recently discharge from SNF who presented to [facility] ED 08/21/2014 with worsening SOB, weakness for past few days prior to this admission.   No complaints of abdominal pain, nausea or vomiting, diarrhea or constipation.   She was admitted with CHF exacerbation.  We have been asked to see the patient for possible cholecystitis.  Her Alk Phos, AST/ALT were elevated and [place] sh owed cholelithiasis with marked GB wall thickening measuring 29m.  Also noted to have a nonobstruction CBD stone.  Bilirubin and WBC are normal.    She says she didn't know she had abdominal pain until Dr. CWyline Copaspushed in her RUQ and she nearly jumped off the bed.  She notes some nausea, anorexia, and dry heaving.  Denies any vomiting.  No radiating pain, no other alleviating/precipitating factors.  She denies relationship to food.  She denies any previous abdominal pain and she was unaware that she has gallstones.  PSH includes a hysterectomy and left emergent inguinal hernia repair.     ROS: All systems reviewed and otherwise negative except for as above  Family History  Problem Relation Age of Onset  . Hypertension      Past Medical History  Diagnosis Date  . Syncope   . Sinus node dysfunction     a. s/p pacemaker.  . Wide-complex tachycardia   . Cholelithiasis   . Glucose intolerance (impaired glucose tolerance)   . Anxiety   . PONV (postoperative nausea and vomiting)   . Chronic kidney disease     a. Baseline Cr reported 1.8-2.0.  . Injury of right upper arm     after venipuncture in 3/13 - pt describes pain afterwards- no followup done seen by PCP- no further treatment   . PAF (paroxysmal atrial fibrillation)   .  Left bundle branch block   . Tachycardia-bradycardia syndrome   . CHF (congestive heart failure)     a. EF 30-35% by echo 2015.  .Marland KitchenPacemaker     Guidant, implanted 2006  . Type II diabetes mellitus     "recently dx'd" (03/09/2014)  . Breast cancer 2003    "radiation on right; mastectomy on the left"  . Hypertension   . GERD (gastroesophageal reflux disease)   . DJD (degenerative joint disease)   . Arthritis     "real bad in my lower back" (03/09/2014)  . Chronic lower back pain   . Orthostatic hypotension     a. Diuretics d/c'd 03/2014.  .Marland KitchenPAT (paroxysmal atrial tachycardia)     Past Surgical History  Procedure Laterality Date  . Replacement total knee bilateral Bilateral   . Total shoulder replacement    . Tonsillectomy    . Orif distal femur fracture Right 12/2007  . Tonsillectomy    . Mastectomy Left 2003  . Total hip arthroplasty  06/16/2012    Procedure: TOTAL HIP ARTHROPLASTY;  Surgeon: MMauri Pole MD;  Location: [facility] ORS;  Service: Orthopedics;  Laterality: Left;  . Hip closed reduction  06/30/2012    Procedure: CLOSED MANIPULATION HIP;  Surgeon: MMauri Pole MD;  Location: [facility] ORS;  Service: Orthopedics;  Laterality: Left;  . Abdominal hysterectomy    . Pacemaker  insertion      Guidant Insignia I  . Cataract extraction w/ intraocular lens  implant, bilateral Bilateral 2000's  . Refractive surgery Bilateral 01/2014  . Inguinal hernia repair Left     "had emergency OR for it a few years ago" (03/09/2014)  . Joint replacement    . Breast biopsy Bilateral     Social History:  reports that she has quit smoking. Her smoking use included Cigarettes. She has a 3 pack-year smoking history. She has never used smokeless tobacco. She reports that she drinks alcohol. She reports that she does not use illicit drugs.  Allergies:  Allergies  Allergen Reactions  . Codeine Nausea And Vomiting  . Hydrocodone-Acetaminophen Itching  . Other Nausea And Vomiting     Narcotics  dizziness    Medications Prior to Admission  Medication Sig Dispense Refill  . acetaminophen (TYLENOL) 500 MG tablet Take 500 mg by mouth every 6 (six) hours as needed (joint pain).      [name] ALPRAZolam (XANAX) 0.25 MG tablet Take one tablet by mouth twice daily for anxiety  60 tablet  5  . Alum & Mag Hydroxide-Simeth (MAGIC MOUTHWASH) SOLN Take 10 mLs by mouth 3 (three) times daily as needed for mouth pain (mouth pain).      [name] aspirin 81 MG chewable tablet Chew 1 tablet (81 mg total) by mouth daily.      . diclofenac sodium (VOLTAREN) 1 % GEL Apply 2 g topically 4 (four) times daily as needed (joint pain).      [name] docusate sodium 100 MG CAPS Take 100 mg by mouth 2 (two) times daily.  10 capsule  0  . metoprolol (LOPRESSOR) 50 MG tablet Take 1.5 tablets (75 mg total) by mouth 2 (two) times daily.  90 tablet  6  . pantoprazole (PROTONIX) 40 MG tablet Take 40 mg by mouth daily.      [name] spironolactone (ALDACTONE) 25 MG tablet Take 25 mg by mouth daily.        Blood pressure 143/72, pulse 65, temperature 97.5 F (36.4 C), temperature source Oral, resp. rate 22, height _0  (1.626 m), weight 111 lb 15.9 oz (50.8 kg), SpO2 100.00%. Physical Exam: General: pleasant, WD/WN white female who is laying in bed in NAD HEENT: head is normocephalic, atraumatic.  Sclera are noninjected.  PERRL.  Ears and nose without any masses or lesions.  Mouth is pink and moist Heart: regular, rate, and rhythm.  No obvious murmurs, gallops, or rubs noted.  Palpable pedal pulses bilaterally Lungs: CTAB, no wheezes, rhonchi, or rales noted.  Respiratory effort nonlabored Abd: soft, ND, only tender in RUQ where she is quite tender to palpation at one specific place over the gallbladder, +BS, no masses, hernias, or organomegaly, well healed surgical scars MS: all 4 extremities are symmetrical with no cyanosis, clubbing, or edema. Skin: warm and dry with no masses, lesions, or rashes Psych: A&Ox3 with an appropriate  affect.   Results for orders placed during the hospital encounter of 08/21/14 (from the past 48 hour(s))  CBC WITH DIFFERENTIAL     Status: Abnormal   Collection Time    08/21/14  4:55 PM      Result Value Ref Range   WBC 7.9  4.0 - 10.5 K/uL   RBC 3.82 (*) 3.87 - 5.11 MIL/uL   Hemoglobin 10.6 (*) 12.0 - 15.0 g/dL   HCT 33.3 (*) 36.0 - 46.0 %   MCV 87.2  78.0 - 100.0 fL  MCH 27.7  26.0 - 34.0 pg   MCHC 31.8  30.0 - 36.0 g/dL   RDW 16.6 (*) 11.5 - 15.5 %   Platelets 281  150 - 400 K/uL   Neutrophils Relative % 81 (*) 43 - 77 %   Neutro Abs 6.4  1.7 - 7.7 K/uL   Lymphocytes Relative 10 (*) 12 - 46 %   Lymphs Abs 0.8  0.7 - 4.0 K/uL   Monocytes Relative 9  3 - 12 %   Monocytes Absolute 0.7  0.1 - 1.0 K/uL   Eosinophils Relative 0  0 - 5 %   Eosinophils Absolute 0.0  0.0 - 0.7 K/uL   Basophils Relative 0  0 - 1 %   Basophils Absolute 0.0  0.0 - 0.1 K/uL  COMPREHENSIVE METABOLIC PANEL     Status: Abnormal   Collection Time    08/21/14  4:55 PM      Result Value Ref Range   Sodium 139  137 - 147 mEq/L   Potassium 4.9  3.7 - 5.3 mEq/L   Chloride 103  96 - 112 mEq/L   CO2 20  19 - 32 mEq/L   Glucose, Bld 204 (*) 70 - 99 mg/dL   BUN 36 (*) 6 - 23 mg/dL   Creatinine, Ser 1.59 (*) 0.50 - 1.10 mg/dL   Calcium 9.1  8.4 - 10.5 mg/dL   Total Protein 6.0  6.0 - 8.3 g/dL   Albumin 3.4 (*) 3.5 - 5.2 g/dL   AST 105 (*) 0 - 37 U/L   ALT 53 (*) 0 - 35 U/L   Alkaline Phosphatase 223 (*) 39 - 117 U/L   Total Bilirubin 0.9  0.3 - 1.2 mg/dL   GFR calc non Af Amer 28 (*) >90 mL/min   GFR calc Af Amer 33 (*) >90 mL/min   Comment: (NOTE)     The eGFR has been calculated using the CKD EPI equation.     This calculation has not been validated in all clinical situations.     eGFR's persistently <90 mL/min signify possible Chronic Kidney     Disease.   Anion gap 16 (*) 5 - 15  PRO B NATRIURETIC PEPTIDE     Status: Abnormal   Collection Time    08/21/14  4:55 PM      Result Value Ref Range    Pro B Natriuretic peptide (BNP) 37601.0 (*) 0 - 450 pg/mL  TROPONIN I     Status: None   Collection Time    08/21/14  4:55 PM      Result Value Ref Range   Troponin I <0.30  <0.30 ng/mL   Comment:            Due to the release kinetics of cTnI,     a negative result within the first hours     of the onset of symptoms does not rule out     myocardial infarction with certainty.     If myocardial infarction is still suspected,     repeat the test at appropriate intervals.  PRO B NATRIURETIC PEPTIDE     Status: Abnormal   Collection Time    08/21/14  9:39 PM      Result Value Ref Range   Pro B Natriuretic peptide (BNP) 42122.0 (*) 0 - 450 pg/mL  TSH     Status: Abnormal   Collection Time    08/21/14  9:39 PM      Result Value  Ref Range   TSH 6.110 (*) 0.350 - 4.500 uIU/mL   Comment: Performed at [facility], [facility]     Status: Abnormal   Collection Time    08/21/14 11:09 PM      Result Value Ref Range   Glucose-Capillary 184 (*) 70 - 99 mg/dL  BASIC METABOLIC PANEL     Status: Abnormal   Collection Time    08/22/14  4:45 AM      Result Value Ref Range   Sodium 139  137 - 147 mEq/L   Potassium 4.7  3.7 - 5.3 mEq/L   Chloride 103  96 - 112 mEq/L   CO2 20  19 - 32 mEq/L   Glucose, Bld 141 (*) 70 - 99 mg/dL   BUN 37 (*) 6 - 23 mg/dL   Creatinine, Ser 1.80 (*) 0.50 - 1.10 mg/dL   Calcium 8.8  8.4 - 10.5 mg/dL   GFR calc non Af Amer 24 (*) >90 mL/min   GFR calc Af Amer 28 (*) >90 mL/min   Comment: (NOTE)     The eGFR has been calculated using the CKD EPI equation.     This calculation has not been validated in all clinical situations.     eGFR's persistently <90 mL/min signify possible Chronic Kidney     Disease.   Anion gap 16 (*) 5 - 15  GLUCOSE, CAPILLARY     Status: Abnormal   Collection Time    08/22/14  7:59 AM      Result Value Ref Range   Glucose-Capillary 137 (*) 70 - 99 mg/dL  GLUCOSE, CAPILLARY     Status: Abnormal   Collection Time     08/22/14 12:00 PM      Result Value Ref Range   Glucose-Capillary 112 (*) 70 - 99 mg/dL  URINALYSIS, ROUTINE W REFLEX MICROSCOPIC     Status: Abnormal   Collection Time    08/22/14  1:26 PM      Result Value Ref Range   Color, Urine YELLOW  YELLOW   APPearance CLOUDY (*) CLEAR   Specific Gravity, Urine 1.008  1.005 - 1.030   pH 5.0  5.0 - 8.0   Glucose, UA NEGATIVE  NEGATIVE mg/dL   Hgb urine dipstick NEGATIVE  NEGATIVE   Bilirubin Urine NEGATIVE  NEGATIVE   Ketones, ur NEGATIVE  NEGATIVE mg/dL   Protein, ur NEGATIVE  NEGATIVE mg/dL   Urobilinogen, UA 0.2  0.0 - 1.0 mg/dL   Nitrite NEGATIVE  NEGATIVE   Leukocytes, UA TRACE (*) NEGATIVE  URINE MICROSCOPIC-ADD ON     Status: None   Collection Time    08/22/14  1:26 PM      Result Value Ref Range   Squamous Epithelial / LPF RARE  RARE   WBC, UA 3-6  <3 WBC/hpf   Dg Chest 2 View  08/21/2014   CLINICAL DATA:  Shortness of breath and cough for 1 month.  EXAM: CHEST  2 VIEW  COMPARISON:  PA and lateral chest 06/29/2014.  FINDINGS: Left worse than right effusions and airspace disease persist. Aeration right lung base appears slightly improved. There is cardiomegaly. No pneumothorax. Pacing device remains in place. Right shoulder replacement is noted. Severe degenerative disease left shoulder is unchanged.  IMPRESSION: Persistent left worse than right effusions and airspace disease. Aeration in the right lung base appears slightly improved. No new abnormality.  Cardiomegaly.   Electronically Signed   By: Inge Rise M.D.  On: 08/21/2014 17:35   US Abdomen Limited  08/22/2014   ADDENDUM REPORT: 08/22/2014 09:43  ADDENDUM: A small right pleural effusion is also present.   Electronically Signed   By: Lawrence Santiago M.D.   On: 08/22/2014 09:43   08/22/2014   CLINICAL DATA:  Abnormal LFTs  EXAM: US ABDOMEN LIMITED - RIGHT UPPER QUADRANT  COMPARISON:  CT abdomen and pelvis 01/23/2012.  FINDINGS: Gallbladder:  Multiple enlarged shadowing  stones are present bilaterally scratch the multiple enlarged shadowing stones are present there is marked wall thickening, measuring up to 1.6 cm. No sonographic Percell Miller sign is present. There is no pericholecystic fluid.  Common bile duct:  Diameter: The common bile duct measures 4 mm. Echogenic material is seen within the duct which may represent a small stone. Mild intrahepatic dilation is suggested.  Liver:  No focal lesion identified. Within normal limits in parenchymal echogenicity.  IMPRESSION: 1. Cholelithiasis with marked gallbladder wall thickening, measuring up to 16 mm. Although there is no sonographic Murphy sign, findings are concerning for cholecystitis. 2. Echogenic material within the common bile duct is concerning for a nonobstructing stone. The duct measures 4 mm.  Electronically Signed: By: Lawrence Santiago M.D. On: 08/22/2014 09:21      Assessment/Plan Cholelithiasis ? Cholecystitis Gallbladder wall thickening RUQ abdominal pain Elevated alk phos and ALT/AST CHF, acute on chronic combined systolic/diastolic (EF 40-90%) Pacemaker Malnutrition Anemia of CD GERD  Plan: 1. Pt has been NPO and no pain meds for 6 hours.  Will send for HIDA scan to see if she truly has cholecystitis.  If its positive, she will need to see if card would clear her or if she would be better suited with a perc-chole tube instead of an operation.  I suspect she may not be a surgical candidate based on her CHF and EF of 15-20%. 2.  NPO until after HIDA then can have clears, then NPO after MN, bowel rest, IVF, pain control, antiemetics, antibiotics (on unasyn, but consider switching to rocephin which is better for cholecystitis) 3.  SCD's and lovenox for DVT proph 4.  Ambulate and IS   Coralie Keens, [facility] 08/22/2014, 2:24 PM Pager: [phone]

## 2014-08-22 NOTE — Consult Note (Signed)
Agree with above. We will followup after HIDA scan. I am told cardiology will evaluate regarding operative risk. She may be a better candidate for percutaneous drainage.

## 2014-08-22 NOTE — Progress Notes (Signed)
SLP Cancellation Note  Patient Details Name: Sophia Oliver MRN: [id number] DOB: 01-Sep-1924   Cancelled treatment:        Order received for swallow evaluation.  Pt currently off the floor for a procedure.  RN reports pt may need surgery tomorrow.  Will check back 10/20 for readiness for swallow eval.   Quinn Axe T 08/22/2014, 3:33 PM

## 2014-08-22 NOTE — Progress Notes (Signed)
  Echocardiogram 2D Echocardiogram has been performed.  Javonna Balli 08/22/2014, 2:26 PM

## 2014-08-22 NOTE — Progress Notes (Signed)
ANTIBIOTIC CONSULT NOTE - INITIAL  Pharmacy Consult for Unasyn Indication: Intra-abdominal infection  Assessment: 89yoF admitted 10/18 for CHF exacerbation.  PMH mixed CHF, atrial fibrillation, pacemaker, CKD stage 3; recently discharged from SNF.  Today Abd US reveals cholelithiasis with suspected cholecystitis; pharmacy consulted to dose Unasyn.  Afebrile WBC wnl Renal: CKD-3, no clear baseline, but SCr was as low as 1.5 in August.  Now slightly higher at 1.8; CrCl 18 ml/min CG  No cultures obtained  Goal of Therapy:  Eradication of infection Appropriate renal dosing of antibiotics   Plan:   Ampicillin/sulbactam 1.5 g IV q12  Follow renal function, clinical course (including any surgical plans)    Allergies  Allergen Reactions  . Codeine Nausea And Vomiting  . Hydrocodone-Acetaminophen Itching  . Other Nausea And Vomiting    Narcotics  dizziness    Patient Measurements: Height: 5\' 4"  (162.6 cm) Weight: 111 lb 15.9 oz (50.8 kg) IBW/kg (Calculated) : 54.7 Adjusted Body Weight: n/a  Vital Signs: Temp: 97.5 F (36.4 C) (10/19 0546) Temp Source: Oral (10/19 0546) BP: 143/72 mmHg (10/19 1005) Pulse Rate: 65 (10/19 1005) Intake/Output from previous day: 10/18 0701 - 10/19 0700 In: 140 [I.V.:140] Out: -  Intake/Output from this shift: Total I/O In: -  Out: 1 [Urine:1]  Labs:  Recent Labs  08/21/14 1655 08/22/14 0445  WBC 7.9  --   HGB 10.6*  --   PLT 281  --   CREATININE 1.59* 1.80*   Estimated Creatinine Clearance: 17.7 ml/min (by C-G formula based on Cr of 1.8). No results found for this basename: VANCOTROUGH, VANCOPEAK, VANCORANDOM, GENTTROUGH, GENTPEAK, GENTRANDOM, TOBRATROUGH, TOBRAPEAK, TOBRARND, AMIKACINPEAK, AMIKACINTROU, AMIKACIN,  in the last 72 hours   Microbiology: No results found for this or any previous visit (from the past 720 hour(s)).  Medical History: Past Medical History  Diagnosis Date  . Syncope   . Sinus node dysfunction      a. s/p pacemaker.  . Wide-complex tachycardia   . Cholelithiasis   . Glucose intolerance (impaired glucose tolerance)   . Anxiety   . PONV (postoperative nausea and vomiting)   . Chronic kidney disease     a. Baseline Cr reported 1.8-2.0.  . Injury of right upper arm     after venipuncture in 3/13 - pt describes pain afterwards- no followup done seen by PCP- no further treatment   . PAF (paroxysmal atrial fibrillation)   . Left bundle branch block   . Tachycardia-bradycardia syndrome   . CHF (congestive heart failure)     a. EF 30-35% by echo 2015.  [name] Pacemaker     Guidant, implanted 2006  . Type II diabetes mellitus     "recently dx'd" (03/09/2014)  . Breast cancer 2003    "radiation on right; mastectomy on the left"  . Hypertension   . GERD (gastroesophageal reflux disease)   . DJD (degenerative joint disease)   . Arthritis     "real bad in my lower back" (03/09/2014)  . Chronic lower back pain   . Orthostatic hypotension     a. Diuretics d/c'd 03/2014.  [name] PAT (paroxysmal atrial tachycardia)     Medications:  Infusions:   Anti-infectives   None      Reuel Boom, PharmD Pager: [phone] 08/22/2014, 1:55 PM

## 2014-08-22 NOTE — Progress Notes (Addendum)
[facility] PROGRESS NOTE  Sophia Oliver JGG:[id number] DOB: February 12, 1924 DOA: 08/21/2014 PCP: [name], MD  Off Service Summary [phone] who presents with acute systolic/diastolic chf exacerbation likely from salt-laden foods from SNF from which the patient was recently discharged from. During her work up, the patient was noted to have an elevated alk phos with evidence of cholecystitis. General Surgery was consulted and Cardiology consulted.   Assessment/Plan: Acute respiratory failure with hypoxia / Acute on chronic combined systolic and diastolic CHF (congestive heart failure)  Suspect secondary to salt-laden food while at SNF per patient Initially required 4LNC, now on min O2 support Initially cont on lasix 40 mg IV daily, transitioned to 68m PO given rise in Cr overnight Patient already on BB at home, metoprolol. Dose reduced from 75 to 50 mg PO BID). No ACEi due to renal insufficiency.  Continue aspirin. Continue spironolactone.  Last 2 D ECHO in 03/2014 with EF of 15-20%.  Continue to monitor intake and output, daily weight, replete electrolytes as needed.  Monitor on telemetry.  Active Problems:  CKD (chronic kidney disease), stage III  Recent baseline creatinine 1.5 - 1.7 so admission creatinine value in baseline range.  Monitor renal function Pacemaker  Stable. Malnutrition of moderate degree  Nutrition consulted.  Anemia of chronic disease  Likely due to anemia of chronic kidney disease  Hemoglobin stable. No current indications for transfusion.  GERD (gastroesophageal reflux disease)  Continue protonix. DVT prophylaxis:  SCD's bilaterally  Cholecystitis Elevated alk phos with tenderness over GB and GB wall thickening with sludge on UKoreaConsulted General Surgery Cardiology consulted for clearance Given low EF, percutaneous drain may be best option  Code Status: DNR Family Communication: Pt and family in room (indicate person spoken with, relationship, and if by  phone, the number) Disposition Plan: Pending   Consultants:  General Surgery  Cardiology  Procedures:    Antibiotics:  Unasyn 10/19>>> (indicate start date, and stop date if known)  HPI/Subjective: No acute events noted overnight  Objective: Filed Vitals:   08/21/14 1851 08/21/14 2105 08/22/14 0546 08/22/14 1005  BP: 144/73 135/68 134/68 143/72  Pulse: 65 64 64 65  Temp:  97.8 F (36.6 C) 97.5 F (36.4 C)   TempSrc:  Oral Oral   Resp: _0 Height:  _1  (1.626 m)    Weight:  52.9 kg (116 lb 10 oz) 50.8 kg (111 lb 15.9 oz)   SpO2: 100% 97% 99% 100%    Intake/Output Summary (Last 24 hours) at 08/22/14 1804 Last data filed at 08/22/14 1500  Gross per 24 hour  Intake    310 ml  Output      1 ml  Net    309 ml   Filed Weights   08/21/14 2105 08/22/14 0546  Weight: 52.9 kg (116 lb 10 oz) 50.8 kg (111 lb 15.9 oz)    Exam:   General:  Awake, in nad  Cardiovascular: regular, s1, s2  Respiratory: normal resp effort, no wheezing  Abdomen: soft,nondistended  Musculoskeletal: perfused, no clubbing   Data Reviewed: Basic Metabolic Panel:  Recent Labs Lab 08/21/14 1655 08/22/14 0445  NA 139 139  K 4.9 4.7  CL 103 103  CO2 20 20  GLUCOSE 204* 141*  BUN 36* 37*  CREATININE 1.59* 1.80*  CALCIUM 9.1 8.8   Liver Function Tests:  Recent Labs Lab 08/21/14 1655  AST 105*  ALT 53*  ALKPHOS 223*  BILITOT 0.9  PROT 6.0  ALBUMIN 3.4*  No results found for this basename: LIPASE, AMYLASE,  in the last 168 hours No results found for this basename: AMMONIA,  in the last 168 hours CBC:  Recent Labs Lab 08/21/14 1655  WBC 7.9  NEUTROABS 6.4  HGB 10.6*  HCT 33.3*  MCV 87.2  PLT 281   Cardiac Enzymes:  Recent Labs Lab 08/21/14 1655  TROPONINI <0.30   BNP (last 3 results)  Recent Labs  06/24/14 1113 08/21/14 1655 08/21/14 2139  PROBNP 16090.0* 37601.0* 42122.0*   CBG:  Recent Labs Lab 08/21/14 2309 08/22/14 0759  08/22/14 1200 08/22/14 1656  GLUCAP 184* 137* 112* 92    No results found for this or any previous visit (from the past 240 hour(s)).   Studies: Dg Chest 2 View  08/21/2014   CLINICAL DATA:  Shortness of breath and cough for 1 month.  EXAM: CHEST  2 VIEW  COMPARISON:  PA and lateral chest 06/29/2014.  FINDINGS: Left worse than right effusions and airspace disease persist. Aeration right lung base appears slightly improved. There is cardiomegaly. No pneumothorax. Pacing device remains in place. Right shoulder replacement is noted. Severe degenerative disease left shoulder is unchanged.  IMPRESSION: Persistent left worse than right effusions and airspace disease. Aeration in the right lung base appears slightly improved. No new abnormality.  Cardiomegaly.   Electronically Signed   By: Inge Rise M.D.   On: 08/21/2014 17:35   Nm Hepatobiliary Including Gb  08/22/2014   CLINICAL DATA:  Abdominal pain. Current ultrasound showed cholelithiasis and significant gallbladder wall thickening and edema.  EXAM: NUCLEAR MEDICINE HEPATOBILIARY IMAGING  TECHNIQUE: Sequential images of the abdomen were obtained out to 60 minutes following intravenous administration of radiopharmaceutical.  RADIOPHARMACEUTICALS:  5.5 Millicurie UR-42H Choletec  COMPARISON:  Current abdominal ultrasound  FINDINGS: There is prompt and homogeneous accumulation of radiotracer by the liver. Prompt excretion was seen into the intra and extrahepatic biliary tree. Small bowel activity was noted during the mid portion of the exam.  Imaging over 1 hr demonstrated no radiotracer activity within the gallbladder consistent with an obstructed cystic duct.  IMPRESSION: 1. Nonvisualized gallbladder consistent with obstruction of the cystic duct. This supports the diagnosis of acute cholecystitis.   Electronically Signed   By: Lajean Manes M.D.   On: 08/22/2014 16:42   US Abdomen Limited  08/22/2014   ADDENDUM REPORT: 08/22/2014 09:43   ADDENDUM: A small right pleural effusion is also present.   Electronically Signed   By: Lawrence Santiago M.D.   On: 08/22/2014 09:43   08/22/2014   CLINICAL DATA:  Abnormal LFTs  EXAM: US ABDOMEN LIMITED - RIGHT UPPER QUADRANT  COMPARISON:  CT abdomen and pelvis 01/23/2012.  FINDINGS: Gallbladder:  Multiple enlarged shadowing stones are present bilaterally scratch the multiple enlarged shadowing stones are present there is marked wall thickening, measuring up to 1.6 cm. No sonographic Percell Miller sign is present. There is no pericholecystic fluid.  Common bile duct:  Diameter: The common bile duct measures 4 mm. Echogenic material is seen within the duct which may represent a small stone. Mild intrahepatic dilation is suggested.  Liver:  No focal lesion identified. Within normal limits in parenchymal echogenicity.  IMPRESSION: 1. Cholelithiasis with marked gallbladder wall thickening, measuring up to 16 mm. Although there is no sonographic Murphy sign, findings are concerning for cholecystitis. 2. Echogenic material within the common bile duct is concerning for a nonobstructing stone. The duct measures 4 mm.  Electronically Signed: By: Lawrence Santiago M.D. On: 08/22/2014  09:21    Scheduled Meds: . ampicillin-sulbactam (UNASYN) IV  1.5 g Intravenous Q12H  . antiseptic oral rinse  7 mL Mouth Rinse BID  . aspirin EC  81 mg Oral Daily  . docusate sodium  100 mg Oral BID  . furosemide  40 mg Oral Daily  . insulin aspart  0-9 Units Subcutaneous TID WC  . metoprolol  50 mg Oral BID  . pantoprazole  40 mg Oral Daily  . sodium chloride  3 mL Intravenous Q12H  . spironolactone  25 mg Oral Daily   Continuous Infusions:   Principal Problem:   Acute respiratory failure with hypoxia Active Problems:   CKD (chronic kidney disease), stage III   Pacemaker   PAF (paroxysmal atrial fibrillation)   Malnutrition of moderate degree   GERD (gastroesophageal reflux disease)   Acute on chronic combined systolic and  diastolic CHF (congestive heart failure)   Anemia of chronic disease  Time spent: 80mn  Lilia Letterman, [facility] Pager [phone] If 7PM-7AM, please contact night-coverage at [email or web address], password [email or web address]10/19/2015, 6:04 PM  LOS: 1 day

## 2014-08-22 NOTE — Progress Notes (Signed)
Dual chamber pacemaker check, post ER visit last night.  Normal function.  Underlying rhythm is sinus at 45-50bpm with short episodes of A-Tach.  Ventricular lead issue (high impedance) is well-known to [facility].  Atrial lead:  0.43mv, 350 ohms, 0.7V@1 .16ms  A paced:  81% since Sept 29, 2015.  [facility] [employer] [phone]

## 2014-08-22 NOTE — Consult Note (Signed)
Reason for Consult: Preop surgical clearance for cholecystectomy Referring Physician: [facility]  Sophia Oliver is an 80 y.o. female.  HPI: Patient is 80 year old female with past medical history significant for coronary artery disease questionable history of anteroseptal wall myocardial infarction in the past, hypertension, valvular heart disease with moderate mitral regurgitation and severe tricuspid regurgitation, history of congestive heart failure secondary to systolic and diastolic dysfunction, pulmonary hypertension, history of tachybradycardia syndrome status post permanent pacemaker in the past history of atrial tachyarrhythmias and atrial fibrillation, sinus node dysfunction, chronic kidney disease stage III, history of CVA of breast, degenerative joint disease, osteoporosis, history of syncope in the past, diabetes mellitus, chronic kidney disease, was admitted yesterday because of progressive increasing shortness of breath associated with generalized weak feeling in the abdominal pain. Patient denies any fever but complains of chills. Patient had abdominal ultrasound which suggested acute cholecystitis. Patient denies any chest pain at present states abdominal pain has markedly improved. Patient does complaints of shortness of breath and feeling weak and tired. Cardiologic consultation is called because of surgical clearance for cholecystectomy.  Past Medical History  Diagnosis Date  . Syncope   . Sinus node dysfunction     a. s/p pacemaker.  . Wide-complex tachycardia   . Cholelithiasis   . Glucose intolerance (impaired glucose tolerance)   . Anxiety   . PONV (postoperative nausea and vomiting)   . Chronic kidney disease     a. Baseline Cr reported 1.8-2.0.  . Injury of right upper arm     after venipuncture in 3/13 - pt describes pain afterwards- no followup done seen by PCP- no further treatment   . PAF (paroxysmal atrial fibrillation)   . Left bundle branch block   .  Tachycardia-bradycardia syndrome   . CHF (congestive heart failure)     a. EF 30-35% by echo 2015.  [name] Pacemaker     Guidant, implanted 2006  . Type II diabetes mellitus     "recently dx'd" (03/09/2014)  . Breast cancer 2003    "radiation on right; mastectomy on the left"  . Hypertension   . GERD (gastroesophageal reflux disease)   . DJD (degenerative joint disease)   . Arthritis     "real bad in my lower back" (03/09/2014)  . Chronic lower back pain   . Orthostatic hypotension     a. Diuretics d/c'd 03/2014.  [name] PAT (paroxysmal atrial tachycardia)     Past Surgical History  Procedure Laterality Date  . Replacement total knee bilateral Bilateral   . Total shoulder replacement    . Tonsillectomy    . Orif distal femur fracture Right 12/2007  . Tonsillectomy    . Mastectomy Left 2003  . Total hip arthroplasty  06/16/2012    Procedure: TOTAL HIP ARTHROPLASTY;  Surgeon: Mauri Pole, MD;  Location: [facility];  Service: Orthopedics;  Laterality: Left;  . Hip closed reduction  06/30/2012    Procedure: CLOSED MANIPULATION HIP;  Surgeon: Mauri Pole, MD;  Location: [facility];  Service: Orthopedics;  Laterality: Left;  . Abdominal hysterectomy    . Pacemaker insertion      Guidant Insignia I  . Cataract extraction w/ intraocular lens  implant, bilateral Bilateral 2000's  . Refractive surgery Bilateral 01/2014  . Inguinal hernia repair Left     "had emergency OR for it a few years ago" (03/09/2014)  . Joint replacement    . Breast biopsy Bilateral     Family History  Problem Relation Age  of Onset  . Hypertension      Social History:  reports that she has quit smoking. Her smoking use included Cigarettes. She has a 3 pack-year smoking history. She has never used smokeless tobacco. She reports that she drinks alcohol. She reports that she does not use illicit drugs.  Allergies:  Allergies  Allergen Reactions  . Codeine Nausea And Vomiting  . Hydrocodone-Acetaminophen Itching  . Other  Nausea And Vomiting    Narcotics  dizziness    Medications: I have reviewed the patient's current medications.  Results for orders placed during the hospital encounter of 08/21/14 (from the past 48 hour(s))  CBC WITH DIFFERENTIAL     Status: Abnormal   Collection Time    08/21/14  4:55 PM      Result Value Ref Range   WBC 7.9  4.0 - 10.5 K/uL   RBC 3.82 (*) 3.87 - 5.11 MIL/uL   Hemoglobin 10.6 (*) 12.0 - 15.0 g/dL   HCT 33.3 (*) 36.0 - 46.0 %   MCV 87.2  78.0 - 100.0 fL   MCH 27.7  26.0 - 34.0 pg   MCHC 31.8  30.0 - 36.0 g/dL   RDW 16.6 (*) 11.5 - 15.5 %   Platelets 281  150 - 400 K/uL   Neutrophils Relative % 81 (*) 43 - 77 %   Neutro Abs 6.4  1.7 - 7.7 K/uL   Lymphocytes Relative 10 (*) 12 - 46 %   Lymphs Abs 0.8  0.7 - 4.0 K/uL   Monocytes Relative 9  3 - 12 %   Monocytes Absolute 0.7  0.1 - 1.0 K/uL   Eosinophils Relative 0  0 - 5 %   Eosinophils Absolute 0.0  0.0 - 0.7 K/uL   Basophils Relative 0  0 - 1 %   Basophils Absolute 0.0  0.0 - 0.1 K/uL  COMPREHENSIVE METABOLIC PANEL     Status: Abnormal   Collection Time    08/21/14  4:55 PM      Result Value Ref Range   Sodium 139  137 - 147 mEq/L   Potassium 4.9  3.7 - 5.3 mEq/L   Chloride 103  96 - 112 mEq/L   CO2 20  19 - 32 mEq/L   Glucose, Bld 204 (*) 70 - 99 mg/dL   BUN 36 (*) 6 - 23 mg/dL   Creatinine, Ser 1.59 (*) 0.50 - 1.10 mg/dL   Calcium 9.1  8.4 - 10.5 mg/dL   Total Protein 6.0  6.0 - 8.3 g/dL   Albumin 3.4 (*) 3.5 - 5.2 g/dL   AST 105 (*) 0 - 37 U/L   ALT 53 (*) 0 - 35 U/L   Alkaline Phosphatase 223 (*) 39 - 117 U/L   Total Bilirubin 0.9  0.3 - 1.2 mg/dL   GFR calc non Af Amer 28 (*) >90 mL/min   GFR calc Af Amer 33 (*) >90 mL/min   Comment: (NOTE)     The eGFR has been calculated using the CKD EPI equation.     This calculation has not been validated in all clinical situations.     eGFR's persistently <90 mL/min signify possible Chronic Kidney     Disease.   Anion gap 16 (*) 5 - 15  PRO B  NATRIURETIC PEPTIDE     Status: Abnormal   Collection Time    08/21/14  4:55 PM      Result Value Ref Range   Pro B Natriuretic peptide (BNP)  37601.0 (*) 0 - 450 pg/mL  TROPONIN I     Status: None   Collection Time    08/21/14  4:55 PM      Result Value Ref Range   Troponin I <0.30  <0.30 ng/mL   Comment:            Due to the release kinetics of cTnI,     a negative result within the first hours     of the onset of symptoms does not rule out     myocardial infarction with certainty.     If myocardial infarction is still suspected,     repeat the test at appropriate intervals.  PRO B NATRIURETIC PEPTIDE     Status: Abnormal   Collection Time    08/21/14  9:39 PM      Result Value Ref Range   Pro B Natriuretic peptide (BNP) 42122.0 (*) 0 - 450 pg/mL  TSH     Status: Abnormal   Collection Time    08/21/14  9:39 PM      Result Value Ref Range   TSH 6.110 (*) 0.350 - 4.500 uIU/mL   Comment: Performed at [facility], [facility]     Status: Abnormal   Collection Time    08/21/14 11:09 PM      Result Value Ref Range   Glucose-Capillary 184 (*) 70 - 99 mg/dL  BASIC METABOLIC PANEL     Status: Abnormal   Collection Time    08/22/14  4:45 AM      Result Value Ref Range   Sodium 139  137 - 147 mEq/L   Potassium 4.7  3.7 - 5.3 mEq/L   Chloride 103  96 - 112 mEq/L   CO2 20  19 - 32 mEq/L   Glucose, Bld 141 (*) 70 - 99 mg/dL   BUN 37 (*) 6 - 23 mg/dL   Creatinine, Ser 1.80 (*) 0.50 - 1.10 mg/dL   Calcium 8.8  8.4 - 10.5 mg/dL   GFR calc non Af Amer 24 (*) >90 mL/min   GFR calc Af Amer 28 (*) >90 mL/min   Comment: (NOTE)     The eGFR has been calculated using the CKD EPI equation.     This calculation has not been validated in all clinical situations.     eGFR's persistently <90 mL/min signify possible Chronic Kidney     Disease.   Anion gap 16 (*) 5 - 15  GLUCOSE, CAPILLARY     Status: Abnormal   Collection Time    08/22/14  7:59 AM      Result Value Ref  Range   Glucose-Capillary 137 (*) 70 - 99 mg/dL  GLUCOSE, CAPILLARY     Status: Abnormal   Collection Time    08/22/14 12:00 PM      Result Value Ref Range   Glucose-Capillary 112 (*) 70 - 99 mg/dL  URINALYSIS, ROUTINE W REFLEX MICROSCOPIC     Status: Abnormal   Collection Time    08/22/14  1:26 PM      Result Value Ref Range   Color, Urine YELLOW  YELLOW   APPearance CLOUDY (*) CLEAR   Specific Gravity, Urine 1.008  1.005 - 1.030   pH 5.0  5.0 - 8.0   Glucose, UA NEGATIVE  NEGATIVE mg/dL   Hgb urine dipstick NEGATIVE  NEGATIVE   Bilirubin Urine NEGATIVE  NEGATIVE   Ketones, ur NEGATIVE  NEGATIVE mg/dL   Protein, ur NEGATIVE  NEGATIVE mg/dL   Urobilinogen, UA 0.2  0.0 - 1.0 mg/dL   Nitrite NEGATIVE  NEGATIVE   Leukocytes, UA TRACE (*) NEGATIVE  URINE MICROSCOPIC-ADD ON     Status: None   Collection Time    08/22/14  1:26 PM      Result Value Ref Range   Squamous Epithelial / LPF RARE  RARE   WBC, UA 3-6  <3 WBC/hpf  GLUCOSE, CAPILLARY     Status: None   Collection Time    08/22/14  4:56 PM      Result Value Ref Range   Glucose-Capillary 92  70 - 99 mg/dL   Comment 1 Documented in Chart     Comment 2 Notify RN      Dg Chest 2 View  08/21/2014   CLINICAL DATA:  Shortness of breath and cough for 1 month.  EXAM: CHEST  2 VIEW  COMPARISON:  PA and lateral chest 06/29/2014.  FINDINGS: Left worse than right effusions and airspace disease persist. Aeration right lung base appears slightly improved. There is cardiomegaly. No pneumothorax. Pacing device remains in place. Right shoulder replacement is noted. Severe degenerative disease left shoulder is unchanged.  IMPRESSION: Persistent left worse than right effusions and airspace disease. Aeration in the right lung base appears slightly improved. No new abnormality.  Cardiomegaly.   Electronically Signed   By: Inge Rise M.D.   On: 08/21/2014 17:35   Nm Hepatobiliary Including Gb  08/22/2014   CLINICAL DATA:  Abdominal pain.  Current ultrasound showed cholelithiasis and significant gallbladder wall thickening and edema.  EXAM: NUCLEAR MEDICINE HEPATOBILIARY IMAGING  TECHNIQUE: Sequential images of the abdomen were obtained out to 60 minutes following intravenous administration of radiopharmaceutical.  RADIOPHARMACEUTICALS:  5.5 Millicurie VE-72C Choletec  COMPARISON:  Current abdominal ultrasound  FINDINGS: There is prompt and homogeneous accumulation of radiotracer by the liver. Prompt excretion was seen into the intra and extrahepatic biliary tree. Small bowel activity was noted during the mid portion of the exam.  Imaging over 1 hr demonstrated no radiotracer activity within the gallbladder consistent with an obstructed cystic duct.  IMPRESSION: 1. Nonvisualized gallbladder consistent with obstruction of the cystic duct. This supports the diagnosis of acute cholecystitis.   Electronically Signed   By: Lajean Manes M.D.   On: 08/22/2014 16:42   US Abdomen Limited  08/22/2014   ADDENDUM REPORT: 08/22/2014 09:43  ADDENDUM: A small right pleural effusion is also present.   Electronically Signed   By: Lawrence Santiago M.D.   On: 08/22/2014 09:43   08/22/2014   CLINICAL DATA:  Abnormal LFTs  EXAM: US ABDOMEN LIMITED - RIGHT UPPER QUADRANT  COMPARISON:  CT abdomen and pelvis 01/23/2012.  FINDINGS: Gallbladder:  Multiple enlarged shadowing stones are present bilaterally scratch the multiple enlarged shadowing stones are present there is marked wall thickening, measuring up to 1.6 cm. No sonographic Percell Miller sign is present. There is no pericholecystic fluid.  Common bile duct:  Diameter: The common bile duct measures 4 mm. Echogenic material is seen within the duct which may represent a small stone. Mild intrahepatic dilation is suggested.  Liver:  No focal lesion identified. Within normal limits in parenchymal echogenicity.  IMPRESSION: 1. Cholelithiasis with marked gallbladder wall thickening, measuring up to 16 mm. Although there is no  sonographic Murphy sign, findings are concerning for cholecystitis. 2. Echogenic material within the common bile duct is concerning for a nonobstructing stone. The duct measures 4 mm.  Electronically Signed: By: Lawrence Santiago  M.D. On: 08/22/2014 09:21    Review of Systems  Constitutional: Positive for chills and malaise/fatigue. Negative for fever.  Eyes: Negative for blurred vision, double vision and photophobia.  Respiratory: Positive for cough and shortness of breath.   Cardiovascular: Positive for orthopnea. Negative for chest pain and leg swelling.  Gastrointestinal: Positive for nausea and abdominal pain.  Genitourinary: Negative for dysuria.  Neurological: Positive for dizziness and weakness. Negative for headaches.   Blood pressure 143/72, pulse 65, temperature 97.5 F (36.4 C), temperature source Oral, resp. rate 22, height '5\' 4"'  (1.626 m), weight 50.8 kg (111 lb 15.9 oz), SpO2 100.00%. Physical Exam  Constitutional: She is oriented to person, place, and time.  Eyes: Conjunctivae are normal. Left eye exhibits no discharge. No scleral icterus.  Neck: Normal range of motion. Neck supple. JVD present. No tracheal deviation present. No thyromegaly present.  Cardiovascular:  Regular rate and rhythm S1 and S2 soft 3/6 systolic murmur and S3 gallop noted  Respiratory:  Decreased breath sound bilaterally left more than right with bilateral rhonchi and rales  GI: Soft. Bowel sounds are normal. She exhibits no distension. There is no tenderness. There is no rebound.  Musculoskeletal: She exhibits no edema and no tenderness.  Neurological: She is alert and oriented to person, place, and time.    Assessment/Plan: Probable resolving acute cholecystitis Coronary artery disease history of anteroseptal wall MI in the past Decompensated congestive heart failure secondary to systolic/diastolic dysfunction and valvular heart disease Severe pulmonary hypertension Hypertension Diabetes  mellitus Chronic kidney disease Hypercholesteremia Osteoarthritis Osteoporosis Tachybradycardia syndrome status post permanent pacemaker in the past History of atrial tachycardia arrhythmias and atrial fibrillation in the past History of syncope in the past Plan Continue present management Start low-dose angiotensin receptor blocker. Monitor renal function. Patient is high risk for any surgical procedure in view of multiple comorbidities and marked depressed LV systolic function/CHF/valvular heart disease and severe pulmonary hypertension. Would favor medical management only if condition deteriorates agree with percutaneous drainage Sarahgrace Broman N 08/22/2014, 5:56 PM

## 2014-08-23 DIAGNOSIS — K819 Cholecystitis, unspecified: Secondary | ICD-10-CM

## 2014-08-23 LAB — PROTIME-INR
INR: 1.38 (ref 0.00–1.49)
Prothrombin Time: 17.1 seconds — ABNORMAL HIGH (ref 11.6–15.2)

## 2014-08-23 LAB — COMPREHENSIVE METABOLIC PANEL
ALT: 29 U/L (ref 0–35)
AST: 21 U/L (ref 0–37)
Albumin: 3.2 g/dL — ABNORMAL LOW (ref 3.5–5.2)
Alkaline Phosphatase: 160 U/L — ABNORMAL HIGH (ref 39–117)
Anion gap: 19 — ABNORMAL HIGH (ref 5–15)
BUN: 37 mg/dL — ABNORMAL HIGH (ref 6–23)
CO2: 23 meq/L (ref 19–32)
Calcium: 9.2 mg/dL (ref 8.4–10.5)
Chloride: 108 mEq/L (ref 96–112)
Creatinine, Ser: 1.88 mg/dL — ABNORMAL HIGH (ref 0.50–1.10)
GFR, EST AFRICAN AMERICAN: 27 mL/min — AB (ref >90)
GFR, EST NON AFRICAN AMERICAN: 23 mL/min — AB (ref >90)
Glucose, Bld: 82 mg/dL (ref 70–99)
Potassium: 4.3 mEq/L (ref 3.7–5.3)
Sodium: 150 mEq/L — ABNORMAL HIGH (ref 137–147)
Total Bilirubin: 0.8 mg/dL (ref 0.3–1.2)
Total Protein: 5.8 g/dL — ABNORMAL LOW (ref 6.0–8.3)

## 2014-08-23 LAB — CBC
HCT: 31.4 % — ABNORMAL LOW (ref 36.0–46.0)
Hemoglobin: 10.1 g/dL — ABNORMAL LOW (ref 12.0–15.0)
MCH: 27.6 pg (ref 26.0–34.0)
MCHC: 32.2 g/dL (ref 30.0–36.0)
MCV: 85.8 fL (ref 78.0–100.0)
Platelets: 265 10*3/uL (ref 150–400)
RBC: 3.66 MIL/uL — ABNORMAL LOW (ref 3.87–5.11)
RDW: 16.6 % — AB (ref 11.5–15.5)
WBC: 7.6 10*3/uL (ref 4.0–10.5)

## 2014-08-23 LAB — APTT: aPTT: 28 seconds (ref 24–37)

## 2014-08-23 LAB — MAGNESIUM: Magnesium: 1.9 mg/dL (ref 1.5–2.5)

## 2014-08-23 LAB — GLUCOSE, CAPILLARY
Glucose-Capillary: 83 mg/dL (ref 70–99)
Glucose-Capillary: 92 mg/dL (ref 70–99)
Glucose-Capillary: 75 mg/dL (ref 70–99)
Glucose-Capillary: 122 mg/dL — ABNORMAL HIGH (ref 70–99)

## 2014-08-23 MED ORDER — VITAMINS A & D EX OINT
TOPICAL_OINTMENT | CUTANEOUS | Status: AC
  Administered 2014-08-23: 5
  Filled 2014-08-23: qty 5

## 2014-08-23 NOTE — Progress Notes (Signed)
Subjective:  Patient denies any chest pain states breathing is improved. Abdominal pain also improved feels hungry denies any fever chills scheduled for cholecystostomy tomorrow  Objective:  Vital Signs in the last 24 hours: Temp:  [97.8 F (36.6 C)-98.5 F (36.9 C)] 98.1 F (36.7 C) (10/20 1500) Pulse Rate:  [63-69] 69 (10/20 1500) Resp:  [16-18] 18 (10/20 1500) BP: (121-137)/(53-81) 121/81 mmHg (10/20 1500) SpO2:  [95 %-100 %] 95 % (10/20 1500) Weight:  [51.7 kg (113 lb 15.7 oz)] 51.7 kg (113 lb 15.7 oz) (10/20 0501)  Intake/Output from previous day: 10/19 0701 - 10/20 0700 In: 765 [P.O.:380; I.V.:285; IV Piggyback:100] Out: 2151 [Urine:2151] Intake/Output from this shift: Total I/O In: -  Out: 1600 [Urine:1600]  Physical Exam: Neck: no adenopathy, no carotid bruit, no JVD and supple, symmetrical, trachea midline Lungs: Decrease vessel at bases with faint rales air entry improved Heart: regular rate and rhythm, S1, S2 normal and 3/6 systolic murmur noted soft S3 gallop noted Abdomen: Soft bowel sounds present minimal tenderness right upper quadrant no guarding Extremities: extremities normal, atraumatic, no cyanosis or edema  Lab Results:  Recent Labs  08/21/14 1655 08/23/14 1140  WBC 7.9 7.6  HGB 10.6* 10.1*  PLT 281 265    Recent Labs  08/22/14 0445 08/23/14 0448  NA 139 150*  K 4.7 4.3  CL 103 108  CO2 20 23  GLUCOSE 141* 82  BUN 37* 37*  CREATININE 1.80* 1.88*    Recent Labs  08/21/14 1655  TROPONINI <0.30   Hepatic Function Panel  Recent Labs  08/23/14 0448  PROT 5.8*  ALBUMIN 3.2*  AST 21  ALT 29  ALKPHOS 160*  BILITOT 0.8   No results found for this basename: CHOL,  in the last 72 hours No results found for this basename: PROTIME,  in the last 72 hours  Imaging: Imaging results have been reviewed and Nm Hepatobiliary Including Gb  08/22/2014   CLINICAL DATA:  Abdominal pain. Current ultrasound showed cholelithiasis and significant  gallbladder wall thickening and edema.  EXAM: NUCLEAR MEDICINE HEPATOBILIARY IMAGING  TECHNIQUE: Sequential images of the abdomen were obtained out to 60 minutes following intravenous administration of radiopharmaceutical.  RADIOPHARMACEUTICALS:  5.5 Millicurie [id number] Choletec  COMPARISON:  Current abdominal ultrasound  FINDINGS: There is prompt and homogeneous accumulation of radiotracer by the liver. Prompt excretion was seen into the intra and extrahepatic biliary tree. Small bowel activity was noted during the mid portion of the exam.  Imaging over 1 hr demonstrated no radiotracer activity within the gallbladder consistent with an obstructed cystic duct.  IMPRESSION: 1. Nonvisualized gallbladder consistent with obstruction of the cystic duct. This supports the diagnosis of acute cholecystitis.   Electronically Signed   By: Lajean Manes M.D.   On: 08/22/2014 16:42   US Abdomen Limited  08/22/2014   ADDENDUM REPORT: 08/22/2014 09:43  ADDENDUM: A small right pleural effusion is also present.   Electronically Signed   By: Lawrence Santiago M.D.   On: 08/22/2014 09:43   08/22/2014   CLINICAL DATA:  Abnormal LFTs  EXAM: US ABDOMEN LIMITED - RIGHT UPPER QUADRANT  COMPARISON:  CT abdomen and pelvis 01/23/2012.  FINDINGS: Gallbladder:  Multiple enlarged shadowing stones are present bilaterally scratch the multiple enlarged shadowing stones are present there is marked wall thickening, measuring up to 1.6 cm. No sonographic Percell Miller sign is present. There is no pericholecystic fluid.  Common bile duct:  Diameter: The common bile duct measures 4 mm. Echogenic material is seen  within the duct which may represent a small stone. Mild intrahepatic dilation is suggested.  Liver:  No focal lesion identified. Within normal limits in parenchymal echogenicity.  IMPRESSION: 1. Cholelithiasis with marked gallbladder wall thickening, measuring up to 16 mm. Although there is no sonographic Murphy sign, findings are concerning for  cholecystitis. 2. Echogenic material within the common bile duct is concerning for a nonobstructing stone. The duct measures 4 mm.  Electronically Signed: By: Lawrence Santiago M.D. On: 08/22/2014 09:21    Cardiac Studies:  Assessment/Plan:  Resolving acute cholecystitis  Coronary artery disease history of anteroseptal wall MI in the past  Decompensated congestive heart failure secondary to systolic/diastolic dysfunction and valvular heart disease  Severe pulmonary hypertension  Hypertension  Diabetes mellitus  Chronic kidney disease  Hypercholesteremia  Osteoarthritis  Osteoporosis  Tachybradycardia syndrome status post permanent pacemaker in the past  History of atrial tachycardia arrhythmias and atrial fibrillation in the past  History of syncope in the past Plan Continue present management Check LFTs CBC basic metabolic panel in a.m.  LOS: 2 days    Waldon Sheerin N 08/23/2014, 5:43 PM

## 2014-08-23 NOTE — Progress Notes (Signed)
Summoned back to room by friend from [place] to speak with son, Sophia Oliver who is now in patient's room. Spoke with patient, friend and husband earlier about [facility] services. Spoke with patient's son at bedside who has more questions and concerns regarding discharge plans. States he wants to know patient's options and patient needs to go to rehab post discharge and not home. Endorses patient does not have any more SNF days but wants to know what other options there are. Sophia Oliver states paying out of pocket for SNF or private duty is not really an option. Made inpatient LCSW and inpatient RNCM aware. Son asks that if he is not in the room to please call him on cell at [phone]. Will continue to follow. Of note, Sophia Oliver states patient does not have Medicaid.  [facility] [phone]

## 2014-08-23 NOTE — Consult Note (Signed)
Reason for consult: percutaneous cholecystostomy  Referring Physician(s): Dr. Olen Pel  History of Present Illness: Sophia Oliver is a 80 y.o. female with significant PMH including CHF with EF 15-20%, pacemaker secondary to tachy/brady syndrome with atrial tachyarrhythmias/afib, stage 3 CKD, MR/TR, pulmonary HTN, breast cancer 2003, bilat pleural effusions who was recently admitted with weakness/dyspnea , CHF exacerbation. F/u labs revealed elevated LFT'S and subsequent [place] abd on 10/19 demonstrated cholelithiasis with marked gallbladder wall thickening, measuring up to 16 mm with findings concerning for cholecystitis.There was echogenic material within the common bile duct  concerning for a nonobstructing stone. The duct measured 4 mm. Follow up HIDA scan on 10/19 revealed nonvisualization of GB c/w cystic duct obstruction/acute cholecystitis. Pt does endorse moderate RUQ discomfort with palpation. WBC/t bili levels are normal. Cardiology evaluation has deemed pt high risk for operative cholecystectomy . Request now received for percutaneous cholecystostomy.    Past Medical History  Diagnosis Date  . Syncope   . Sinus node dysfunction     a. s/p pacemaker.  . Wide-complex tachycardia   . Cholelithiasis   . Glucose intolerance (impaired glucose tolerance)   . Anxiety   . PONV (postoperative nausea and vomiting)   . Chronic kidney disease     a. Baseline Cr reported 1.8-2.0.  . Injury of right upper arm     after venipuncture in 3/13 - pt describes pain afterwards- no followup done seen by PCP- no further treatment   . PAF (paroxysmal atrial fibrillation)   . Left bundle branch block   . Tachycardia-bradycardia syndrome   . CHF (congestive heart failure)     a. EF 30-35% by echo 2015.  [name] Pacemaker     Guidant, implanted 2006  . Type II diabetes mellitus     "recently dx'd" (03/09/2014)  . Breast cancer 2003    "radiation on right; mastectomy on the left"  . Hypertension   . GERD  (gastroesophageal reflux disease)   . DJD (degenerative joint disease)   . Arthritis     "real bad in my lower back" (03/09/2014)  . Chronic lower back pain   . Orthostatic hypotension     a. Diuretics d/c'd 03/2014.  [name] PAT (paroxysmal atrial tachycardia)     Past Surgical History  Procedure Laterality Date  . Replacement total knee bilateral Bilateral   . Total shoulder replacement    . Tonsillectomy    . Orif distal femur fracture Right 12/2007  . Tonsillectomy    . Mastectomy Left 2003  . Total hip arthroplasty  06/16/2012    Procedure: TOTAL HIP ARTHROPLASTY;  Surgeon: Mauri Pole, MD;  Location: [facility];  Service: Orthopedics;  Laterality: Left;  . Hip closed reduction  06/30/2012    Procedure: CLOSED MANIPULATION HIP;  Surgeon: Mauri Pole, MD;  Location: [facility];  Service: Orthopedics;  Laterality: Left;  . Abdominal hysterectomy    . Pacemaker insertion      Guidant Insignia I  . Cataract extraction w/ intraocular lens  implant, bilateral Bilateral 2000's  . Refractive surgery Bilateral 01/2014  . Inguinal hernia repair Left     "had emergency OR for it a few years ago" (03/09/2014)  . Joint replacement    . Breast biopsy Bilateral     Allergies: Codeine; Hydrocodone-acetaminophen; and Other  Medications: Prior to Admission medications   Medication Sig Start Date End Date Taking? Authorizing Provider  acetaminophen (TYLENOL) 500 MG tablet Take 500 mg by mouth every 6 (six)  hours as needed (joint pain).   Yes Historical Provider, MD  ALPRAZolam Duanne Moron) 0.25 MG tablet Take one tablet by mouth twice daily for anxiety 05/29/14  Yes Theodis Blaze, MD  Alum & Mag Hydroxide-Simeth (MAGIC MOUTHWASH) SOLN Take 10 mLs by mouth 3 (three) times daily as needed for mouth pain (mouth pain).   Yes Historical Provider, MD  aspirin 81 MG chewable tablet Chew 1 tablet (81 mg total) by mouth daily. 06/30/14  Yes Costin Karlyne Greenspan, MD  diclofenac sodium (VOLTAREN) 1 % GEL Apply 2 g topically 4  (four) times daily as needed (joint pain).   Yes Historical Provider, MD  docusate sodium 100 MG CAPS Take 100 mg by mouth 2 (two) times daily. 05/29/14  Yes Theodis Blaze, MD  metoprolol (LOPRESSOR) 50 MG tablet Take 1.5 tablets (75 mg total) by mouth 2 (two) times daily. 08/02/14  Yes Deboraha Sprang, MD  pantoprazole (PROTONIX) 40 MG tablet Take 40 mg by mouth daily.   Yes Historical Provider, MD  spironolactone (ALDACTONE) 25 MG tablet Take 25 mg by mouth daily. 05/19/14  Yes Historical Provider, MD    Family History  Problem Relation Age of Onset  . Hypertension      History   Social History  . Marital Status: Married    Spouse Name: N/A    Number of Children: N/A  . Years of Education: N/A   Social History Main Topics  . Smoking status: Former Smoker -- 0.50 packs/day for 6 years    Types: Cigarettes  . Smokeless tobacco: Never Used  . Alcohol Use: Yes     Comment: "I'll have a social drink maybe at [employer]  . Drug Use: No  . Sexual Activity: No   Other Topics Concern  . None   Social History Narrative   Married, retired.          Review of Systems   Constitutional: Positive for fatigue. Negative for fever and chills.  Respiratory: Positive for cough and shortness of breath.   Cardiovascular: Negative for chest pain.  Gastrointestinal: Positive for nausea. Negative for vomiting, abdominal pain and blood in stool.  Genitourinary: Negative for dysuria and hematuria.  Musculoskeletal: Positive for back pain.  Neurological: Negative for headaches.    Vital Signs: BP 137/53  Pulse 65  Temp(Src) 98.5 F (36.9 C) (Oral)  Resp 16  Ht 5\' 4"  (1.626 m)  Wt 113 lb 15.7 oz (51.7 kg)  BMI 19.55 kg/m2  SpO2 98%  Physical Exam  Constitutional: She is oriented to person, place, and time.  Thin WF in NAD  Cardiovascular: Normal rate and regular rhythm.   Murmur heard. Rt chest wall pacer  Pulmonary/Chest: Effort normal.  Dim BS bases, L>R  Abdominal: Soft.  Bowel sounds are normal. She exhibits no distension. There is tenderness.  Musculoskeletal: Normal range of motion. She exhibits no edema.  Neurological: She is alert and oriented to person, place, and time.    Imaging: Dg Chest 2 View  08/21/2014   CLINICAL DATA:  Shortness of breath and cough for 1 month.  EXAM: CHEST  2 VIEW  COMPARISON:  PA and lateral chest 06/29/2014.  FINDINGS: Left worse than right effusions and airspace disease persist. Aeration right lung base appears slightly improved. There is cardiomegaly. No pneumothorax. Pacing device remains in place. Right shoulder replacement is noted. Severe degenerative disease left shoulder is unchanged.  IMPRESSION: Persistent left worse than right effusions and airspace disease. Aeration in the right lung  base appears slightly improved. No new abnormality.  Cardiomegaly.   Electronically Signed   By: Inge Rise M.D.   On: 08/21/2014 17:35   Nm Hepatobiliary Including Gb  08/22/2014   CLINICAL DATA:  Abdominal pain. Current ultrasound showed cholelithiasis and significant gallbladder wall thickening and edema.  EXAM: NUCLEAR MEDICINE HEPATOBILIARY IMAGING  TECHNIQUE: Sequential images of the abdomen were obtained out to 60 minutes following intravenous administration of radiopharmaceutical.  RADIOPHARMACEUTICALS:  5.5 Millicurie [id number]-88F Choletec  COMPARISON:  Current abdominal ultrasound  FINDINGS: There is prompt and homogeneous accumulation of radiotracer by the liver. Prompt excretion was seen into the intra and extrahepatic biliary tree. Small bowel activity was noted during the mid portion of the exam.  Imaging over 1 hr demonstrated no radiotracer activity within the gallbladder consistent with an obstructed cystic duct.  IMPRESSION: 1. Nonvisualized gallbladder consistent with obstruction of the cystic duct. This supports the diagnosis of acute cholecystitis.   Electronically Signed   By: Lajean Manes M.D.   On: 08/22/2014 16:42   US  Abdomen Limited  08/22/2014   ADDENDUM REPORT: 08/22/2014 09:43  ADDENDUM: A small right pleural effusion is also present.   Electronically Signed   By: Lawrence Santiago M.D.   On: 08/22/2014 09:43   08/22/2014   CLINICAL DATA:  Abnormal LFTs  EXAM: US ABDOMEN LIMITED - RIGHT UPPER QUADRANT  COMPARISON:  CT abdomen and pelvis 01/23/2012.  FINDINGS: Gallbladder:  Multiple enlarged shadowing stones are present bilaterally scratch the multiple enlarged shadowing stones are present there is marked wall thickening, measuring up to 1.6 cm. No sonographic Percell Miller sign is present. There is no pericholecystic fluid.  Common bile duct:  Diameter: The common bile duct measures 4 mm. Echogenic material is seen within the duct which may represent a small stone. Mild intrahepatic dilation is suggested.  Liver:  No focal lesion identified. Within normal limits in parenchymal echogenicity.  IMPRESSION: 1. Cholelithiasis with marked gallbladder wall thickening, measuring up to 16 mm. Although there is no sonographic Murphy sign, findings are concerning for cholecystitis. 2. Echogenic material within the common bile duct is concerning for a nonobstructing stone. The duct measures 4 mm.  Electronically Signed: By: Lawrence Santiago M.D. On: 08/22/2014 09:21    Labs:  CBC:  Recent Labs  06/26/14 0406 06/27/14 0445 06/28/14 0504 08/21/14 1655  WBC 10.3 9.7 7.7 7.9  HGB 9.6* 9.5* 9.1* 10.6*  HCT 28.9* 28.5* 28.1* 33.3*  PLT 166 171 179 281    COAGS:  Recent Labs  11/01/13 1213  INR 1.7*  APTT 44.6*    BMP:  Recent Labs  06/29/14 0451 08/21/14 1655 08/22/14 0445 08/23/14 0448  NA 138 139 139 150*  K 4.9 4.9 4.7 4.3  CL 109 103 103 108  CO2 13* 20 20 23   GLUCOSE 126* 204* 141* 82  BUN 26* 36* 37* 37*  CALCIUM 9.0 9.1 8.8 9.2  CREATININE 1.52* 1.59* 1.80* 1.88*  GFRNONAA 30* 28* 24* 23*  GFRAA 34* 33* 28* 27*    LIVER FUNCTION TESTS:  Recent Labs  05/24/14 0421 06/26/14 0406 08/21/14 1655  08/23/14 0448  BILITOT 0.4 0.5 0.9 0.8  AST 41* 28 105* 21  ALT 74* 36* 53* 29  ALKPHOS 93 58 223* 160*  PROT 5.9* 5.3* 6.0 5.8*  ALBUMIN 2.8* 2.5* 3.4* 3.2*    TUMOR MARKERS: No results found for this basename: AFPTM, CEA, CA199, CHROMGRNA,  in the last 8760 hours  Assessment and Plan: Sophia Iams  Oliver is a 80 y.o. female with significant PMH including CHF with EF 15-20%, pacemaker secondary to tachy/brady syndrome with atrial tachyarrhythmias/afib, stage 3 CKD, MR/TR, pulmonary HTN, breast cancer 2003, bilat pleural effusions who was recently admitted with weakness/dyspnea , CHF exacerbation. F/u labs revealed elevated LFT'S and subsequent [place] abd on 10/19 demonstrated cholelithiasis with marked gallbladder wall thickening, measuring up to 16 mm with findings concerning for cholecystitis.There was echogenic material within the common bile duct  concerning for a nonobstructing stone. The duct measured 4 mm. Follow up HIDA scan on 10/19 revealed nonvisualization of GB c/w cystic duct obstruction/acute cholecystitis. Pt does endorse moderate RUQ discomfort with palpation. WBC/t bili levels are normal. Cardiology evaluation has deemed pt high risk for operative cholecystectomy . Request now received for percutaneous cholecystostomy. Imaging studies have been reviewed by Dr. Vernard Gambles. Details/risks of procedure d/w pt/pt's son with their understanding and consent.   Thank you for this interesting consult.  I greatly enjoyed meeting Sophia Oliver and look forward to participating in their care.      I spent a total of 40 minutes face to face in clinical consultation, greater than 50% of which was counseling/coordinating care for percutaneous cholecystostomy  Signed: ALLRED,D [facility] 08/23/2014, 10:47 AM

## 2014-08-23 NOTE — Progress Notes (Signed)
Clinical Social Work Department BRIEF PSYCHOSOCIAL ASSESSMENT 08/23/2014  Patient:  Sophia Oliver, Sophia Oliver     Account Number:  [id number]     Admit date:  08/21/2014  Clinical Social Worker:  Renold Genta  Date/Time:  08/23/2014 03:42 PM  Referred by:  Physician  Date Referred:  08/23/2014 Referred for  SNF Placement   Other Referral:   Interview type:  Patient Other interview type:   and family at bedside    PSYCHOSOCIAL DATA Living Status:  [place] Admitted from facility:   Level of care:   Primary support name:  Sidonie Dickens (son) c#: [id number] h#: [phone] Primary support relationship to patient:  CHILD, ADULT Degree of support available:   good    CURRENT CONCERNS Current Concerns  Post-Acute Placement   Other Concerns:    SOCIAL WORK ASSESSMENT / PLAN CSW received consult from [place], Orrin Brigham that patient needs SNF at discharge.   Assessment/plan status:  Information/Referral to [employer] Other assessment/ plan:   Information/referral to community resources:   CSW completed FL2 and faxed information out to [facility] [place] [place] SNFs - provided list of facilties and will follow-up with bed offers when available.    PATIENT'S/FAMILY'S RESPONSE TO PLAN OF CARE: Patient & family were very appreciative of CSW involvement, informed CSW that patient exhausted her Medicare days at [facility] (8/27 - 08/17/14). Family to apply for Medicaid for patient this afternoon. CSW left messages to SNFs for bed offers.       Raynaldo Opitz, [facility] Clinical Social Worker cell #: [phone]

## 2014-08-23 NOTE — Progress Notes (Signed)
[facility] Progress Note     Subjective: Pain still present in the RUQ only on palpation.  No WBC count or fever.  IR proceeding with perc chole tube.  Objective: Vital signs in last 24 hours: Temp:  [97.8 F (36.6 C)-98.5 F (36.9 C)] 98.5 F (36.9 C) (10/20 0501) Pulse Rate:  [63-65] 65 (10/20 0501) Resp:  [16] 16 (10/19 2006) BP: (135-137)/(53-64) 137/53 mmHg (10/20 0501) SpO2:  [98 %-100 %] 98 % (10/20 0501) Weight:  [113 lb 15.7 oz (51.7 kg)] 113 lb 15.7 oz (51.7 kg) (10/20 0501) Last BM Date: 08/22/14  Intake/Output from previous day: 10/19 0701 - 10/20 0700 In: 765 [P.O.:380; I.V.:285; IV Piggyback:100] Out: 2151 [Urine:2151] Intake/Output this shift:    PE: Gen:  Alert, NAD, pleasant Abd: soft, ND, only tender in RUQ where she is quite tender to palpation at one specific place over the gallbladder, +BS, no masses, hernias, or organomegaly, well healed surgical scars   Lab Results:   Recent Labs  08/21/14 1655 08/23/14 1140  WBC 7.9 7.6  HGB 10.6* 10.1*  HCT 33.3* 31.4*  PLT 281 265   BMET  Recent Labs  08/22/14 0445 08/23/14 0448  NA 139 150*  K 4.7 4.3  CL 103 108  CO2 20 23  GLUCOSE 141* 82  BUN 37* 37*  CREATININE 1.80* 1.88*  CALCIUM 8.8 9.2   PT/INR No results found for this basename: LABPROT, INR,  in the last 72 hours CMP     Component Value Date/Time   NA 150* 08/23/2014 0448   K 4.3 08/23/2014 0448   CL 108 08/23/2014 0448   CO2 23 08/23/2014 0448   GLUCOSE 82 08/23/2014 0448   BUN 37* 08/23/2014 0448   CREATININE 1.88* 08/23/2014 0448   CALCIUM 9.2 08/23/2014 0448   PROT 5.8* 08/23/2014 0448   ALBUMIN 3.2* 08/23/2014 0448   AST 21 08/23/2014 0448   ALT 29 08/23/2014 0448   ALKPHOS 160* 08/23/2014 0448   BILITOT 0.8 08/23/2014 0448   GFRNONAA 23* 08/23/2014 0448   GFRAA 27* 08/23/2014 0448   Lipase     Component Value Date/Time   LIPASE 33 12/29/2009 1008       Studies/Results: Dg Chest 2  View  08/21/2014   CLINICAL DATA:  Shortness of breath and cough for 1 month.  EXAM: CHEST  2 VIEW  COMPARISON:  PA and lateral chest 06/29/2014.  FINDINGS: Left worse than right effusions and airspace disease persist. Aeration right lung base appears slightly improved. There is cardiomegaly. No pneumothorax. Pacing device remains in place. Right shoulder replacement is noted. Severe degenerative disease left shoulder is unchanged.  IMPRESSION: Persistent left worse than right effusions and airspace disease. Aeration in the right lung base appears slightly improved. No new abnormality.  Cardiomegaly.   Electronically Signed   By: Inge Rise M.D.   On: 08/21/2014 17:35   Nm Hepatobiliary Including Gb  08/22/2014   CLINICAL DATA:  Abdominal pain. Current ultrasound showed cholelithiasis and significant gallbladder wall thickening and edema.  EXAM: NUCLEAR MEDICINE HEPATOBILIARY IMAGING  TECHNIQUE: Sequential images of the abdomen were obtained out to 60 minutes following intravenous administration of radiopharmaceutical.  RADIOPHARMACEUTICALS:  5.5 Millicurie WC-37S Choletec  COMPARISON:  Current abdominal ultrasound  FINDINGS: There is prompt and homogeneous accumulation of radiotracer by the liver. Prompt excretion was seen into the intra and extrahepatic biliary tree. Small bowel activity was noted during the mid portion of the exam.  Imaging over 1 hr  demonstrated no radiotracer activity within the gallbladder consistent with an obstructed cystic duct.  IMPRESSION: 1. Nonvisualized gallbladder consistent with obstruction of the cystic duct. This supports the diagnosis of acute cholecystitis.   Electronically Signed   By: Lajean Manes M.D.   On: 08/22/2014 16:42   US Abdomen Limited  08/22/2014   ADDENDUM REPORT: 08/22/2014 09:43  ADDENDUM: A small right pleural effusion is also present.   Electronically Signed   By: Lawrence Santiago M.D.   On: 08/22/2014 09:43   08/22/2014   CLINICAL DATA:   Abnormal LFTs  EXAM: US ABDOMEN LIMITED - RIGHT UPPER QUADRANT  COMPARISON:  CT abdomen and pelvis 01/23/2012.  FINDINGS: Gallbladder:  Multiple enlarged shadowing stones are present bilaterally scratch the multiple enlarged shadowing stones are present there is marked wall thickening, measuring up to 1.6 cm. No sonographic Percell Miller sign is present. There is no pericholecystic fluid.  Common bile duct:  Diameter: The common bile duct measures 4 mm. Echogenic material is seen within the duct which may represent a small stone. Mild intrahepatic dilation is suggested.  Liver:  No focal lesion identified. Within normal limits in parenchymal echogenicity.  IMPRESSION: 1. Cholelithiasis with marked gallbladder wall thickening, measuring up to 16 mm. Although there is no sonographic Murphy sign, findings are concerning for cholecystitis. 2. Echogenic material within the common bile duct is concerning for a nonobstructing stone. The duct measures 4 mm.  Electronically Signed: By: Lawrence Santiago M.D. On: 08/22/2014 09:21    Anti-infectives: Anti-infectives   Start     Dose/Rate Route Frequency Ordered Stop   08/22/14 1430  ampicillin-sulbactam (UNASYN) 1.5 g in sodium chloride 0.9 % 50 mL IVPB     1.5 g 100 mL/hr over 30 Minutes Intravenous Every 12 hours 08/22/14 1358         Assessment/Plan Cholelithiasis with cholecystitis Gallbladder wall thickening  RUQ abdominal pain  Elevated alk phos and ALT/AST  CHF, acute on chronic combined systolic/diastolic (EF 94-44%)  Pacemaker  Malnutrition  Anemia of CD  GERD   Plan:  1. Pt's HIDA was positive for cholecystitis, will need Perc chole tube.  Consulted IR, they are scheduling it for lunch time.   2. Pt is not a surgical candidate.  We will have to discuss whether she may EVER be a surgical candidate given her significant co-morbidities.  Will need perc chole tube for 6-8 weeks at least.  At that point the surgeon can decide to just remove the chole tube  or leave it in indefinitely.   3. SCD's and on aspirin 4. Ambulate and IS 5. Will need to follow up with [place] in the office in 3-4 weeks.  May be able to go home in a few days once pain is improved in RUQ.      LOS: 2 days    Coralie Keens 08/23/2014, 11:56 AM Pager: [phone]

## 2014-08-23 NOTE — Progress Notes (Signed)
Clinical Social Work Department CLINICAL SOCIAL WORK PLACEMENT NOTE 08/23/2014  Patient:  Sophia Oliver, Sophia Oliver  Account Number:  [id number] Admit date:  08/21/2014  Clinical Social Worker:  Renold Genta  Date/time:  08/23/2014 03:49 PM  Clinical Social Work is seeking post-discharge placement for this patient at the following level of care:   SKILLED NURSING   (*CSW will update this form in Epic as items are completed)   08/23/2014  Patient/family provided with [facility] Department of Clinical Social Work's list of facilities offering this level of care within the geographic area requested by the patient (or if unable, by the patient's family).  08/23/2014  Patient/family informed of their freedom to choose among providers that offer the needed level of care, that participate in Medicare, Medicaid or managed care program needed by the patient, have an available bed and are willing to accept the patient.  08/23/2014  Patient/family informed of [facility]' ownership interest in [facility], as well as of the fact that they are under no obligation to receive care at this facility.  PASARR submitted to EDS on 08/23/2014 PASARR number received on 08/23/2014  FL2 transmitted to all facilities in geographic area requested by pt/family on  08/23/2014 FL2 transmitted to all facilities within larger geographic area on 08/23/2014  Patient informed that his/her managed care company has contracts with or will negotiate with  certain facilities, including the following:   Medicaid pending     Patient/family informed of bed offers received:   Patient chooses bed at  Physician recommends and patient chooses bed at    Patient to be transferred to  on   Patient to be transferred to facility by  Patient and family notified of transfer on  Name of family member notified:    The following physician request were entered in Epic:   Additional Comments:   Raynaldo Opitz,  [place] Social Worker cell #: [phone]

## 2014-08-23 NOTE — Progress Notes (Signed)
[facility] PROGRESS NOTE  Sophia Oliver VOH:[id number] DOB: 01-10-1924 DOA: 08/21/2014 PCP: [name], MD  Off Service Summary [phone] who presents with acute systolic/diastolic chf exacerbation likely from salt-laden foods from SNF from which the patient was recently discharged from. During her work up, the patient was noted to have an elevated alk phos with evidence of cholecystitis. General Surgery was consulted and Cardiology consulted. Pt for IR cholecystostomy tube placement 10/20  Assessment/Plan: Acute respiratory failure with hypoxia / Acute on chronic combined systolic and diastolic CHF (congestive heart failure)  Suspect secondary to salt-laden food while at SNF per patient Initially required 4LNC, now on min O2 support Initially cont on lasix 40 mg IV daily, transitioned to 63m PO given rise in Cr overnight Patient already on BB at home, metoprolol. Dose reduced from 75 to 50 mg PO BID). No ACEi due to renal insufficiency.  Continue aspirin. Continue spironolactone.  Last 2 D ECHO in 03/2014 with EF of 15-20%.  Continue to monitor intake and output, daily weight, replete electrolytes as needed.  Net neg 1.2L over the past 24hrs Cont tx per Cardiology recs.  CKD (chronic kidney disease), stage III  Recent baseline creatinine 1.5 - 1.7 so admission creatinine value in baseline range.  Monitor renal function Pacemaker  Stable. Malnutrition of moderate degree  Nutrition consulted.  Anemia of chronic disease  Likely due to anemia of chronic kidney disease  Hemoglobin stable. No current indications for transfusion.  GERD (gastroesophageal reflux disease)  Continue protonix. DVT prophylaxis:  SCD's bilaterally  Cholecystitis Elevated alk phos with tenderness over GB and GB wall thickening with sludge on [place] pos for acute cholecystitis Consulted General Surgery - not surgical candidate given pt's low EF Cardiology initially consulted for clearance For IR guided  cholecystostomy tube today  Code Status: DNR Family Communication: Pt in room  Disposition Plan: Pending PT eval - possible d/c in several days   Consultants:  General Surgery  Cardiology  Procedures:    Antibiotics:  Unasyn 10/19>>> (indicate start date, and stop date if known)  HPI/Subjective: Pt reports feeling better. No major events overnight  Objective: Filed Vitals:   08/22/14 0546 08/22/14 1005 08/22/14 2006 08/23/14 0501  BP: 134/68 143/72 135/64 137/53  Pulse: 64 65 63 65  Temp: 97.5 F (36.4 C)  97.8 F (36.6 C) 98.5 F (36.9 C)  TempSrc: Oral  Oral Oral  Resp: _0 Height:      Weight: 50.8 kg (111 lb 15.9 oz)   51.7 kg (113 lb 15.7 oz)  SpO2: 99% 100% 100% 98%    Intake/Output Summary (Last 24 hours) at 08/23/14 1351 Last data filed at 08/23/14 0600  Gross per 24 hour  Intake    765 ml  Output   2150 ml  Net  -1385 ml   Filed Weights   08/21/14 2105 08/22/14 0546 08/23/14 0501  Weight: 52.9 kg (116 lb 10 oz) 50.8 kg (111 lb 15.9 oz) 51.7 kg (113 lb 15.7 oz)    Exam:   General:  Awake, in nad  Cardiovascular: regular, s1, s2  Respiratory: normal resp effort, no wheezing  Abdomen: soft,nondistended  Musculoskeletal: perfused, no clubbing   Data Reviewed: Basic Metabolic Panel:  Recent Labs Lab 08/21/14 1655 08/22/14 0445 08/23/14 0448  NA 139 139 150*  K 4.9 4.7 4.3  CL 103 103 108  CO2 _1 GLUCOSE 204* 141* 82  BUN 36* 37* 37*  CREATININE 1.59* 1.80*  1.88*  CALCIUM 9.1 8.8 9.2  MG  --   --  1.9   Liver Function Tests:  Recent Labs Lab 08/21/14 1655 08/23/14 0448  AST 105* 21  ALT 53* 29  ALKPHOS 223* 160*  BILITOT 0.9 0.8  PROT 6.0 5.8*  ALBUMIN 3.4* 3.2*   No results found for this basename: LIPASE, AMYLASE,  in the last 168 hours No results found for this basename: AMMONIA,  in the last 168 hours CBC:  Recent Labs Lab 08/21/14 1655 08/23/14 1140  WBC 7.9 7.6  NEUTROABS 6.4  --   HGB  10.6* 10.1*  HCT 33.3* 31.4*  MCV 87.2 85.8  PLT 281 265   Cardiac Enzymes:  Recent Labs Lab 08/21/14 1655  TROPONINI <0.30   BNP (last 3 results)  Recent Labs  06/24/14 1113 08/21/14 1655 08/21/14 2139  PROBNP 16090.0* 37601.0* 42122.0*   CBG:  Recent Labs Lab 08/22/14 1200 08/22/14 1656 08/22/14 2128 08/23/14 0755 08/23/14 1210  GLUCAP 112* 92 123* 83 92    No results found for this or any previous visit (from the past 240 hour(s)).   Studies: Dg Chest 2 View  08/21/2014   CLINICAL DATA:  Shortness of breath and cough for 1 month.  EXAM: CHEST  2 VIEW  COMPARISON:  PA and lateral chest 06/29/2014.  FINDINGS: Left worse than right effusions and airspace disease persist. Aeration right lung base appears slightly improved. There is cardiomegaly. No pneumothorax. Pacing device remains in place. Right shoulder replacement is noted. Severe degenerative disease left shoulder is unchanged.  IMPRESSION: Persistent left worse than right effusions and airspace disease. Aeration in the right lung base appears slightly improved. No new abnormality.  Cardiomegaly.   Electronically Signed   By: Inge Rise M.D.   On: 08/21/2014 17:35   Nm Hepatobiliary Including Gb  08/22/2014   CLINICAL DATA:  Abdominal pain. Current ultrasound showed cholelithiasis and significant gallbladder wall thickening and edema.  EXAM: NUCLEAR MEDICINE HEPATOBILIARY IMAGING  TECHNIQUE: Sequential images of the abdomen were obtained out to 60 minutes following intravenous administration of radiopharmaceutical.  RADIOPHARMACEUTICALS:  5.5 Millicurie LT-53U Choletec  COMPARISON:  Current abdominal ultrasound  FINDINGS: There is prompt and homogeneous accumulation of radiotracer by the liver. Prompt excretion was seen into the intra and extrahepatic biliary tree. Small bowel activity was noted during the mid portion of the exam.  Imaging over 1 hr demonstrated no radiotracer activity within the gallbladder  consistent with an obstructed cystic duct.  IMPRESSION: 1. Nonvisualized gallbladder consistent with obstruction of the cystic duct. This supports the diagnosis of acute cholecystitis.   Electronically Signed   By: Lajean Manes M.D.   On: 08/22/2014 16:42   US Abdomen Limited  08/22/2014   ADDENDUM REPORT: 08/22/2014 09:43  ADDENDUM: A small right pleural effusion is also present.   Electronically Signed   By: Lawrence Santiago M.D.   On: 08/22/2014 09:43   08/22/2014   CLINICAL DATA:  Abnormal LFTs  EXAM: US ABDOMEN LIMITED - RIGHT UPPER QUADRANT  COMPARISON:  CT abdomen and pelvis 01/23/2012.  FINDINGS: Gallbladder:  Multiple enlarged shadowing stones are present bilaterally scratch the multiple enlarged shadowing stones are present there is marked wall thickening, measuring up to 1.6 cm. No sonographic Percell Miller sign is present. There is no pericholecystic fluid.  Common bile duct:  Diameter: The common bile duct measures 4 mm. Echogenic material is seen within the duct which may represent a small stone. Mild intrahepatic dilation is suggested.  Liver:  No focal lesion identified. Within normal limits in parenchymal echogenicity.  IMPRESSION: 1. Cholelithiasis with marked gallbladder wall thickening, measuring up to 16 mm. Although there is no sonographic Murphy sign, findings are concerning for cholecystitis. 2. Echogenic material within the common bile duct is concerning for a nonobstructing stone. The duct measures 4 mm.  Electronically Signed: By: Lawrence Santiago M.D. On: 08/22/2014 09:21    Scheduled Meds: . ampicillin-sulbactam (UNASYN) IV  1.5 g Intravenous Q12H  . antiseptic oral rinse  7 mL Mouth Rinse BID  . aspirin EC  81 mg Oral Daily  . docusate sodium  100 mg Oral BID  . furosemide  40 mg Oral Daily  . insulin aspart  0-9 Units Subcutaneous TID WC  . losartan  25 mg Oral Daily  . metoprolol  50 mg Oral BID  . pantoprazole  40 mg Oral Daily  . sodium chloride  3 mL Intravenous Q12H  .  spironolactone  25 mg Oral Daily   Continuous Infusions:   Principal Problem:   Acute respiratory failure with hypoxia Active Problems:   CKD (chronic kidney disease), stage III   Pacemaker   PAF (paroxysmal atrial fibrillation)   Malnutrition of moderate degree   GERD (gastroesophageal reflux disease)   Acute on chronic combined systolic and diastolic CHF (congestive heart failure)   Anemia of chronic disease  Time spent: 48mn  [facility], [facility] Pager [phone] If 7PM-7AM, please contact night-coverage at [email or web address], password [facility]10/20/2015, 1:51 PM  LOS: 2 days

## 2014-08-23 NOTE — Progress Notes (Signed)
SLP Cancellation Note  Patient Details Name: Sophia Oliver MRN: [id number] DOB: 10/02/1924   Cancelled treatment:        Second attempt to see pt for Bedside Swallow Evaluation.  Pt is NPO for procedure.  RN will page once pt is alert and able to participate later this afternoon.   Quinn Axe T 08/23/2014, 10:27 AM

## 2014-08-23 NOTE — Progress Notes (Signed)
Patient ID: Sophia Oliver, female   DOB: 01/11/1924, 80 y.o.   MRN: [id number] Perc cholecystostomy has been rescheduled for 10/21 secondary to heavy IR caseload today. Nurse aware.

## 2014-08-23 NOTE — Progress Notes (Signed)
Patient interviewed and examined, agree with PA note above.  Edward Jolly MD, FACS  08/23/2014 5:36 PM

## 2014-08-23 NOTE — Progress Notes (Signed)
SLP Cancellation Note  Patient Details Name: Sophia Oliver MRN: [id number] DOB: 16-Aug-1924   Cancelled treatment:        RN notified SLP that pt will not have her procedure until 1600 today, and must remain NPO.  BSE will be completed 10/21.   Quinn Axe T 08/23/2014, 3:26 PM

## 2014-08-23 NOTE — Progress Notes (Signed)
[name] to speak with patient about [facility] services. She had been active with [facility] in the past. Consents signed again. Patient appears to really need rehab post discharge. However, she reports she has run out of Medicare days for SNF coverage. Patient lives with husband who is 78 years old and is very hard of hearing and has trouble seeing especially at night. [name]. [name]. Inquired about private pay at SNF vs caregiver assist. [name], from [place], asked if patient could have a hospital bed and a private duty list for caregiver assist. Spoke with inpatient LCSW about above and inquiry about private pay at [facility] and voicemail left for inpatient [facility] regarding bedside encounter. [facility] Care Management services will not interfere or replace home health. Will continue to follow.  Marthenia Rolling, MSN- [facility] [phone]

## 2014-08-24 ENCOUNTER — Inpatient Hospital Stay (HOSPITAL_COMMUNITY): Payer: Medicare Other

## 2014-08-24 DIAGNOSIS — K810 Acute cholecystitis: Secondary | ICD-10-CM

## 2014-08-24 LAB — GLUCOSE, CAPILLARY
GLUCOSE-CAPILLARY: 97 mg/dL (ref 70–99)
Glucose-Capillary: 78 mg/dL (ref 70–99)
Glucose-Capillary: 128 mg/dL — ABNORMAL HIGH (ref 70–99)
Glucose-Capillary: 115 mg/dL — ABNORMAL HIGH (ref 70–99)

## 2014-08-24 LAB — CBC
HCT: 30.8 % — ABNORMAL LOW (ref 36.0–46.0)
Hemoglobin: 10.1 g/dL — ABNORMAL LOW (ref 12.0–15.0)
MCH: 27.4 pg (ref 26.0–34.0)
MCHC: 32.8 g/dL (ref 30.0–36.0)
MCV: 83.5 fL (ref 78.0–100.0)
Platelets: 287 10*3/uL (ref 150–400)
RBC: 3.69 MIL/uL — AB (ref 3.87–5.11)
RDW: 16.6 % — ABNORMAL HIGH (ref 11.5–15.5)
WBC: 6.9 10*3/uL (ref 4.0–10.5)

## 2014-08-24 LAB — COMPREHENSIVE METABOLIC PANEL
ALK PHOS: 141 U/L — AB (ref 39–117)
ALT: 23 U/L (ref 0–35)
AST: 18 U/L (ref 0–37)
Albumin: 3.1 g/dL — ABNORMAL LOW (ref 3.5–5.2)
Anion gap: 15 (ref 5–15)
BUN: 33 mg/dL — ABNORMAL HIGH (ref 6–23)
CALCIUM: 8.7 mg/dL (ref 8.4–10.5)
CO2: 29 meq/L (ref 19–32)
Chloride: 100 mEq/L (ref 96–112)
Creatinine, Ser: 1.74 mg/dL — ABNORMAL HIGH (ref 0.50–1.10)
GFR, EST AFRICAN AMERICAN: 29 mL/min — AB (ref >90)
GFR, EST NON AFRICAN AMERICAN: 25 mL/min — AB (ref >90)
Glucose, Bld: 81 mg/dL (ref 70–99)
POTASSIUM: 3.4 meq/L — AB (ref 3.7–5.3)
SODIUM: 144 meq/L (ref 137–147)
Total Bilirubin: 1 mg/dL (ref 0.3–1.2)
Total Protein: 5.5 g/dL — ABNORMAL LOW (ref 6.0–8.3)

## 2014-08-24 MED ORDER — IOHEXOL 300 MG/ML  SOLN: 20.0000 mL | Freq: Once | INTRAMUSCULAR | Status: AC | PRN

## 2014-08-24 MED ORDER — POTASSIUM CHLORIDE CRYS ER 20 MEQ PO TBCR
20.0000 meq | EXTENDED_RELEASE_TABLET | Freq: Every day | ORAL | Status: DC
  Administered 2014-08-25: 20 meq via ORAL
  Filled 2014-08-24: qty 1

## 2014-08-24 NOTE — Progress Notes (Signed)
Pt requiring 1 on 1 care condition stable. Bedside monitor showing AV paced rhythm.

## 2014-08-24 NOTE — Progress Notes (Signed)
Patient interviewed and examined I discussed case with Dr. Anselm Pancoast.  Ultrasound showed essentially normal gallbladder wall and resolution of inflammation. On exam she is nontender. We have therefore elected to continue treatment with antibiotics only and she appears to be resolving.  Edward Jolly MD, FACS  08/24/2014 2:41 PM

## 2014-08-24 NOTE — Progress Notes (Signed)
ANTIBIOTIC CONSULT NOTE - INITIAL  Pharmacy Consult for Unasyn Indication: Intra-abdominal infection  Assessment: 89yoF admitted 10/18 for CHF exacerbation.  PMH mixed CHF, atrial fibrillation, pacemaker, CKD stage 3; recently discharged from SNF.  Today Abd US reveals cholelithiasis with suspected cholecystitis; pharmacy consulted to dose Unasyn.  Plans for perc drainage cancelled d/t poor surgical candidate and mild Sx  10/19 >> Unasyn >>   Afebrile since admission WBC wnl, stable Renal: CKD-3, no clear baseline, but SCr was as low as 1.5 in August.  Now slightly higher at 1.7; CrCl 17 ml/min CG  No cultures obtained   Goal of Therapy:  Eradication of infection Appropriate renal dosing of antibiotics   Plan:   Continue ampicillin/sulbactam 1.5 g IV q12  Follow renal function, clinical course, LOT    Allergies  Allergen Reactions  . Codeine Nausea And Vomiting  . Hydrocodone-Acetaminophen Itching  . Other Nausea And Vomiting    Narcotics  dizziness    Patient Measurements: Height: 5\' 4"  (162.6 cm) Weight: 105 lb 13.1 oz (48 kg) IBW/kg (Calculated) : 54.7 Adjusted Body Weight: n/a  Vital Signs: Temp: 98 F (36.7 C) (10/21 1346) Temp Source: Oral (10/21 1346) BP: 108/66 mmHg (10/21 1346) Pulse Rate: 97 (10/21 1346) Intake/Output from previous day: 10/20 0701 - 10/21 0700 In: 385 [P.O.:120; I.V.:165; IV Piggyback:100] Out: 3400 [Urine:3400] Intake/Output from this shift: Total I/O In: -  Out: 700 [Urine:700]  Labs:  Recent Labs  08/21/14 1655 08/22/14 0445 08/23/14 0448 08/23/14 1140 08/24/14 0407  WBC 7.9  --   --  7.6 6.9  HGB 10.6*  --   --  10.1* 10.1*  PLT 281  --   --  265 287  CREATININE 1.59* 1.80* 1.88*  --  1.74*   Estimated Creatinine Clearance: 17.3 ml/min (by C-G formula based on Cr of 1.74). No results found for this basename: VANCOTROUGH, VANCOPEAK, VANCORANDOM, GENTTROUGH, GENTPEAK, GENTRANDOM, TOBRATROUGH, TOBRAPEAK, TOBRARND,  AMIKACINPEAK, AMIKACINTROU, AMIKACIN,  in the last 72 hours   Microbiology: No results found for this or any previous visit (from the past 720 hour(s)).  Medical History: Past Medical History  Diagnosis Date  . Syncope   . Sinus node dysfunction     a. s/p pacemaker.  . Wide-complex tachycardia   . Cholelithiasis   . Glucose intolerance (impaired glucose tolerance)   . Anxiety   . PONV (postoperative nausea and vomiting)   . Chronic kidney disease     a. Baseline Cr reported 1.8-2.0.  . Injury of right upper arm     after venipuncture in 3/13 - pt describes pain afterwards- no followup done seen by PCP- no further treatment   . PAF (paroxysmal atrial fibrillation)   . Left bundle branch block   . Tachycardia-bradycardia syndrome   . CHF (congestive heart failure)     a. EF 30-35% by echo 2015.  [name] Pacemaker     Guidant, implanted 2006  . Type II diabetes mellitus     "recently dx'd" (03/09/2014)  . Breast cancer 2003    "radiation on right; mastectomy on the left"  . Hypertension   . GERD (gastroesophageal reflux disease)   . DJD (degenerative joint disease)   . Arthritis     "real bad in my lower back" (03/09/2014)  . Chronic lower back pain   . Orthostatic hypotension     a. Diuretics d/c'd 03/2014.  [name] PAT (paroxysmal atrial tachycardia)     Medications:  Infusions:   Anti-infectives   Start  Dose/Rate Route Frequency Ordered Stop   08/22/14 1430  ampicillin-sulbactam (UNASYN) 1.5 g in sodium chloride 0.9 % 50 mL IVPB     1.5 g 100 mL/hr over 30 Minutes Intravenous Every 12 hours 08/22/14 [place], PharmD Pager: [phone] 08/24/2014, 2:08 PM

## 2014-08-24 NOTE — Progress Notes (Signed)
[facility] PROGRESS NOTE  Sophia Oliver AJO:[id number] DOB: 1924/04/16 DOA: 08/21/2014 PCP: [name], MD  Assessment/Plan  Acute respiratory failure with hypoxia due to acute on chronic combined systolic and diastolic CHF (congestive heart failure), likely due to high salt diet.  Last 2 D ECHO in 03/2014 with EF of 15-20%.  Continue Lasix 40 mg by mouth daily Appreciate cardiology assistance Metoprolol reduced from 75 to 50 mg PO BID Continue aspirin, ARB, and spironolactone.  -3 L yesterday  Was recently started on 2L O2 at home for chronic resp failure  Cholecystitis based on HIDA scan, patient no longer symptomatic Consulted General Surgery - not surgical candidate given pt's low EF  Cholecystostomy tube deferred secondary to improvement in patients clinical status Continue Unasyn CKD (chronic kidney disease), stage III, creatinine stable  Pacemaker placed secondary to tachybradycardia syndrome Stable. Malnutrition of moderate degree  Appreciate nutrition assistance  Anemia of chronic disease,  Likely due to anemia of chronic kidney disease  Hemoglobin stable. No current indications for transfusion.  GERD (gastroesophageal reflux disease) , stable, continue PPI  Diet:  Clear liquid diet Access:  PIV IVF:  Off Proph:  SCDs  Code Status: DNR Family Communication: patient and her daughter Disposition Plan: pending tolerating regular diet and oral antibiotics   Consultants:  General Surgery  Cardiology Radiology Procedures:  -  [place] abd -  HIDA -  CXR Antibiotics:  Unasyn 10/19>>>   HPI/Subjective:  Patient states that she is feeling well. She denies abdominal pain, nausea, vomiting. She denies cough. Overall she feels little better than she did 2 days ago.  Objective: Filed Vitals:   08/23/14 1500 08/23/14 2116 08/24/14 0511 08/24/14 1346  BP: 121/81 138/64 125/50 108/66  Pulse: 69 95 66 97  Temp: 98.1 F (36.7 C) 98.7 F (37.1 C) 98.8 F (37.1 C)  98 F (36.7 C)  TempSrc: Oral Oral Oral Oral  Resp: 18  20 20   Height:      Weight:   48 kg (105 lb 13.1 oz)   SpO2: 95% 97% 96% 96%    Intake/Output Summary (Last 24 hours) at 08/24/14 1747 Last data filed at 08/24/14 1159  Gross per 24 hour  Intake    385 ml  Output   2500 ml  Net  -2115 ml   Filed Weights   08/22/14 0546 08/23/14 0501 08/24/14 0511  Weight: 50.8 kg (111 lb 15.9 oz) 51.7 kg (113 lb 15.7 oz) 48 kg (105 lb 13.1 oz)    Exam:   General:  Cachectic female, No acute distress  HEENT:  NCAT, MMM  Cardiovascular:  RRR, nl S1, S2 no mrg, 2+ pulses, warm extremities  Respiratory:  Rales at the left base, no increased WOB  Abdomen:   NABS, soft, NT/ND  MSK:   Normal tone and bulk, no LEE  Neuro:  Grossly intact  Data Reviewed: Basic Metabolic Panel:  Recent Labs Lab 08/21/14 1655 08/22/14 0445 08/23/14 0448 08/24/14 0407  NA 139 139 150* 144  K 4.9 4.7 4.3 3.4*  CL 103 103 108 100  CO2 20 20 23 29   GLUCOSE 204* 141* 82 81  BUN 36* 37* 37* 33*  CREATININE 1.59* 1.80* 1.88* 1.74*  CALCIUM 9.1 8.8 9.2 8.7  MG  --   --  1.9  --    Liver Function Tests:  Recent Labs Lab 08/21/14 1655 08/23/14 0448 08/24/14 0407  AST 105* 21 18  ALT 53* 29 23  ALKPHOS 223* 160* 141*  BILITOT 0.9 0.8 1.0  PROT 6.0 5.8* 5.5*  ALBUMIN 3.4* 3.2* 3.1*   No results found for this basename: LIPASE, AMYLASE,  in the last 168 hours No results found for this basename: AMMONIA,  in the last 168 hours CBC:  Recent Labs Lab 08/21/14 1655 08/23/14 1140 08/24/14 0407  WBC 7.9 7.6 6.9  NEUTROABS 6.4  --   --   HGB 10.6* 10.1* 10.1*  HCT 33.3* 31.4* 30.8*  MCV 87.2 85.8 83.5  PLT 281 265 287   Cardiac Enzymes:  Recent Labs Lab 08/21/14 1655  TROPONINI <0.30   BNP (last 3 results)  Recent Labs  06/24/14 1113 08/21/14 1655 08/21/14 2139  PROBNP 16090.0* 37601.0* 42122.0*   CBG:  Recent Labs Lab 08/23/14 1656 08/23/14 2124 08/24/14 0756  08/24/14 1136 08/24/14 1711  GLUCAP 75 122* 97 78 128*    No results found for this or any previous visit (from the past 240 hour(s)).   Studies: Dg Chest 2 View  08/24/2014   CLINICAL DATA:  Cough, congestive heart failure.  EXAM: CHEST  2 VIEW  COMPARISON:  August 21, 2014.  FINDINGS: Stable cardiomegaly. Status post right shoulder arthroplasty. Right-sided defibrillator is unchanged in position. Severe degenerative joint disease of left glenohumeral joint is again noted. No pneumothorax is noted. Stable bilateral pleural effusions are noted with left greater than right. Stable central pulmonary vascular congestion is noted. Stable right basilar opacity is noted concerning for possible pneumonia. Left basilar opacity is again noted concerning for pneumonia or atelectasis.  IMPRESSION: Stable bilateral pleural effusions with left greater than right. Stable right basilar opacity is noted concerning for possible pneumonia. Stable left basilar opacity is noted concerning for pneumonia or atelectasis.   Electronically Signed   By: Sabino Dick M.D.   On: 08/24/2014 12:35    Scheduled Meds: . ampicillin-sulbactam (UNASYN) IV  1.5 g Intravenous Q12H  . antiseptic oral rinse  7 mL Mouth Rinse BID  . aspirin EC  81 mg Oral Daily  . docusate sodium  100 mg Oral BID  . furosemide  40 mg Oral Daily  . insulin aspart  0-9 Units Subcutaneous TID WC  . losartan  25 mg Oral Daily  . metoprolol  50 mg Oral BID  . pantoprazole  40 mg Oral Daily  . potassium chloride  20 mEq Oral Daily  . sodium chloride  3 mL Intravenous Q12H  . spironolactone  25 mg Oral Daily   Continuous Infusions:   Principal Problem:   Acute respiratory failure with hypoxia Active Problems:   CKD (chronic kidney disease), stage III   Pacemaker   PAF (paroxysmal atrial fibrillation)   Malnutrition of moderate degree   GERD (gastroesophageal reflux disease)   Acute on chronic combined systolic and diastolic CHF (congestive  heart failure)   Anemia of chronic disease    Time spent: 30 min    Sophira Rumler, [facility] Pager [phone]. If 7PM-7AM, please contact night-coverage at [email or web address], password [facility] 08/24/2014, 5:47 PM  LOS: 3 days

## 2014-08-24 NOTE — Progress Notes (Signed)
SLP Cancellation Note  Patient Details Name: AALAYAH RILES MRN: [id number] DOB: 08-18-1924   Cancelled treatment:       Reason Eval/Treat Not Completed: Patient at procedure or test/unavailable.  Cholecystostomy was deferred until today due to scheduling issues - pt remains at procedure.  Will have to defer swallow eval until next date     Assunta Curtis 08/24/2014, 12:35 PM

## 2014-08-24 NOTE — Progress Notes (Signed)
Patient ID: Sophia Oliver, female   DOB: [birth year], 80 y.o.   MRN: [id number] The patient was brought to IR for percutaneous cholecystomy tube placement.  The patient was asymptomatic.  I examined the gallbladder with [place] and identified multiple gallstones but did not appreciate significant wall thickening that was present on previous exam.  In addition, the patient was only mildly tender to direct palpation of the gallbladder.  Discussed exam findings with Dr. Excell Seltzer of Surgery and we will cancel the drain placement for now.

## 2014-08-24 NOTE — Progress Notes (Signed)
Patient ID: Sophia Oliver, female   DOB: 12/24/1923, 80 y.o.   MRN: [id number]     [facility]      [address] [place], [place] [address]    Phone: [phone] FAX: [phone]     Subjective: Denies abdominal pain, n//v.  Afebrile.  VSS.   Objective:  Vital signs:  Filed Vitals:   08/23/14 0501 08/23/14 1500 08/23/14 2116 08/24/14 0511  BP: 137/53 121/81 138/64 125/50  Pulse: 65 69 95 66  Temp: 98.5 F (36.9 C) 98.1 F (36.7 C) 98.7 F (37.1 C) 98.8 F (37.1 C)  TempSrc: Oral Oral Oral Oral  Resp:  18  20  Height:      Weight: 113 lb 15.7 oz (51.7 kg)   105 lb 13.1 oz (48 kg)  SpO2: 98% 95% 97% 96%    Last BM Date: 08/19/14  Intake/Output   Yesterday:  10/20 0701 - 10/21 0700 In: 385 [P.O.:120; I.V.:165; IV Piggyback:100] Out: 3400 [Urine:3400] This shift:    I/O last 3 completed shifts: In: 600 [P.O.:120; I.V.:330; IV Piggyback:150] Out: 4800 [Urine:4800]    Physical Exam: General: Pt awake/alert/oriented x4 in no acute distress Abdomen: Soft.  Nondistended.  Non tender.  No evidence of peritonitis.  No incarcerated hernias.    Problem List:   Principal Problem:   Acute respiratory failure with hypoxia Active Problems:   CKD (chronic kidney disease), stage III   Pacemaker   PAF (paroxysmal atrial fibrillation)   Malnutrition of moderate degree   GERD (gastroesophageal reflux disease)   Acute on chronic combined systolic and diastolic CHF (congestive heart failure)   Anemia of chronic disease    Results:   Labs: Results for orders placed during the hospital encounter of 08/21/14 (from the past 48 hour(s))  GLUCOSE, CAPILLARY     Status: Abnormal   Collection Time    08/22/14 12:00 PM      Result Value Ref Range   Glucose-Capillary 112 (*) 70 - 99 mg/dL  URINALYSIS, ROUTINE W REFLEX MICROSCOPIC     Status: Abnormal   Collection Time    08/22/14  1:26 PM      Result Value Ref Range   Color,  Urine YELLOW  YELLOW   APPearance CLOUDY (*) CLEAR   Specific Gravity, Urine 1.008  1.005 - 1.030   pH 5.0  5.0 - 8.0   Glucose, UA NEGATIVE  NEGATIVE mg/dL   Hgb urine dipstick NEGATIVE  NEGATIVE   Bilirubin Urine NEGATIVE  NEGATIVE   Ketones, ur NEGATIVE  NEGATIVE mg/dL   Protein, ur NEGATIVE  NEGATIVE mg/dL   Urobilinogen, UA 0.2  0.0 - 1.0 mg/dL   Nitrite NEGATIVE  NEGATIVE   Leukocytes, UA TRACE (*) NEGATIVE  URINE MICROSCOPIC-ADD ON     Status: None   Collection Time    08/22/14  1:26 PM      Result Value Ref Range   Squamous Epithelial / LPF RARE  RARE   WBC, UA 3-6  <3 WBC/hpf  GLUCOSE, CAPILLARY     Status: None   Collection Time    08/22/14  4:56 PM      Result Value Ref Range   Glucose-Capillary 92  70 - 99 mg/dL   Comment 1 Documented in Chart     Comment 2 Notify RN    GLUCOSE, CAPILLARY     Status: Abnormal   Collection Time    08/22/14  9:28 PM  Result Value Ref Range   Glucose-Capillary 123 (*) 70 - 99 mg/dL  COMPREHENSIVE METABOLIC PANEL     Status: Abnormal   Collection Time    08/23/14  4:48 AM      Result Value Ref Range   Sodium 150 (*) 137 - 147 mEq/L   Comment: DELTA CHECK NOTED     REPEATED TO VERIFY   Potassium 4.3  3.7 - 5.3 mEq/L   Chloride 108  96 - 112 mEq/L   CO2 23  19 - 32 mEq/L   Glucose, Bld 82  70 - 99 mg/dL   BUN 37 (*) 6 - 23 mg/dL   Creatinine, Ser 1.88 (*) 0.50 - 1.10 mg/dL   Calcium 9.2  8.4 - 10.5 mg/dL   Total Protein 5.8 (*) 6.0 - 8.3 g/dL   Albumin 3.2 (*) 3.5 - 5.2 g/dL   AST 21  0 - 37 U/L   ALT 29  0 - 35 U/L   Alkaline Phosphatase 160 (*) 39 - 117 U/L   Total Bilirubin 0.8  0.3 - 1.2 mg/dL   GFR calc non Af Amer 23 (*) >90 mL/min   GFR calc Af Amer 27 (*) >90 mL/min   Comment: (NOTE)     The eGFR has been calculated using the CKD EPI equation.     This calculation has not been validated in all clinical situations.     eGFR's persistently <90 mL/min signify possible Chronic Kidney     Disease.   Anion gap 19  (*) 5 - 15  MAGNESIUM     Status: None   Collection Time    08/23/14  4:48 AM      Result Value Ref Range   Magnesium 1.9  1.5 - 2.5 mg/dL  GLUCOSE, CAPILLARY     Status: None   Collection Time    08/23/14  7:55 AM      Result Value Ref Range   Glucose-Capillary 83  70 - 99 mg/dL  CBC     Status: Abnormal   Collection Time    08/23/14 11:40 AM      Result Value Ref Range   WBC 7.6  4.0 - 10.5 K/uL   RBC 3.66 (*) 3.87 - 5.11 MIL/uL   Hemoglobin 10.1 (*) 12.0 - 15.0 g/dL   HCT 31.4 (*) 36.0 - 46.0 %   MCV 85.8  78.0 - 100.0 fL   MCH 27.6  26.0 - 34.0 pg   MCHC 32.2  30.0 - 36.0 g/dL   RDW 16.6 (*) 11.5 - 15.5 %   Platelets 265  150 - 400 K/uL  PROTIME-INR     Status: Abnormal   Collection Time    08/23/14 11:40 AM      Result Value Ref Range   Prothrombin Time 17.1 (*) 11.6 - 15.2 seconds   INR 1.38  0.00 - 1.49  APTT     Status: None   Collection Time    08/23/14 11:40 AM      Result Value Ref Range   aPTT 28  24 - 37 seconds  GLUCOSE, CAPILLARY     Status: None   Collection Time    08/23/14 12:10 PM      Result Value Ref Range   Glucose-Capillary 92  70 - 99 mg/dL  GLUCOSE, CAPILLARY     Status: None   Collection Time    08/23/14  4:56 PM      Result Value Ref Range   Glucose-Capillary  75  70 - 99 mg/dL  GLUCOSE, CAPILLARY     Status: Abnormal   Collection Time    08/23/14  9:24 PM      Result Value Ref Range   Glucose-Capillary 122 (*) 70 - 99 mg/dL  CBC     Status: Abnormal   Collection Time    08/24/14  4:07 AM      Result Value Ref Range   WBC 6.9  4.0 - 10.5 K/uL   RBC 3.69 (*) 3.87 - 5.11 MIL/uL   Hemoglobin 10.1 (*) 12.0 - 15.0 g/dL   HCT 30.8 (*) 36.0 - 46.0 %   MCV 83.5  78.0 - 100.0 fL   MCH 27.4  26.0 - 34.0 pg   MCHC 32.8  30.0 - 36.0 g/dL   RDW 16.6 (*) 11.5 - 15.5 %   Platelets 287  150 - 400 K/uL  COMPREHENSIVE METABOLIC PANEL     Status: Abnormal   Collection Time    08/24/14  4:07 AM      Result Value Ref Range   Sodium 144  137 -  147 mEq/L   Potassium 3.4 (*) 3.7 - 5.3 mEq/L   Comment: DELTA CHECK NOTED     REPEATED TO VERIFY   Chloride 100  96 - 112 mEq/L   CO2 29  19 - 32 mEq/L   Glucose, Bld 81  70 - 99 mg/dL   BUN 33 (*) 6 - 23 mg/dL   Creatinine, Ser 1.74 (*) 0.50 - 1.10 mg/dL   Calcium 8.7  8.4 - 10.5 mg/dL   Total Protein 5.5 (*) 6.0 - 8.3 g/dL   Albumin 3.1 (*) 3.5 - 5.2 g/dL   AST 18  0 - 37 U/L   ALT 23  0 - 35 U/L   Alkaline Phosphatase 141 (*) 39 - 117 U/L   Total Bilirubin 1.0  0.3 - 1.2 mg/dL   GFR calc non Af Amer 25 (*) >90 mL/min   GFR calc Af Amer 29 (*) >90 mL/min   Comment: (NOTE)     The eGFR has been calculated using the CKD EPI equation.     This calculation has not been validated in all clinical situations.     eGFR's persistently <90 mL/min signify possible Chronic Kidney     Disease.   Anion gap 15  5 - 15  GLUCOSE, CAPILLARY     Status: None   Collection Time    08/24/14  7:56 AM      Result Value Ref Range   Glucose-Capillary 97  70 - 99 mg/dL    Imaging / Studies: Nm Hepatobiliary Including Gb  08/22/2014   CLINICAL DATA:  Abdominal pain. Current ultrasound showed cholelithiasis and significant gallbladder wall thickening and edema.  EXAM: NUCLEAR MEDICINE HEPATOBILIARY IMAGING  TECHNIQUE: Sequential images of the abdomen were obtained out to 60 minutes following intravenous administration of radiopharmaceutical.  RADIOPHARMACEUTICALS:  5.5 [employer] BD-53G Choletec  COMPARISON:  Current abdominal ultrasound  FINDINGS: There is prompt and homogeneous accumulation of radiotracer by the liver. Prompt excretion was seen into the intra and extrahepatic biliary tree. Small bowel activity was noted during the mid portion of the exam.  Imaging over 1 hr demonstrated no radiotracer activity within the gallbladder consistent with an obstructed cystic duct.  IMPRESSION: 1. Nonvisualized gallbladder consistent with obstruction of the cystic duct. This supports the diagnosis of acute  cholecystitis.   Electronically Signed   By: Lajean Manes M.D.   On:  08/22/2014 16:42    Medications / Allergies:  Scheduled Meds: . ampicillin-sulbactam (UNASYN) IV  1.5 g Intravenous Q12H  . antiseptic oral rinse  7 mL Mouth Rinse BID  . aspirin EC  81 mg Oral Daily  . docusate sodium  100 mg Oral BID  . furosemide  40 mg Oral Daily  . insulin aspart  0-9 Units Subcutaneous TID WC  . losartan  25 mg Oral Daily  . metoprolol  50 mg Oral BID  . pantoprazole  40 mg Oral Daily  . potassium chloride  20 mEq Oral Daily  . sodium chloride  3 mL Intravenous Q12H  . spironolactone  25 mg Oral Daily   Continuous Infusions:  PRN Meds:.sodium chloride, acetaminophen, ALPRAZolam, diclofenac sodium, lip balm, magic mouthwash, ondansetron (ZOFRAN) IV, sodium chloride  Antibiotics: Anti-infectives   Start     Dose/Rate Route Frequency Ordered Stop   08/22/14 1430  ampicillin-sulbactam (UNASYN) 1.5 g in sodium chloride 0.9 % 50 mL IVPB     1.5 g 100 mL/hr over 30 Minutes Intravenous Every 12 hours 08/22/14 1358        Assessment/Plan Cholelithiasis with cholecystitis  Elevated alk phos and ALT/AST  CHF, acute on chronic combined systolic/diastolic (EF 19-41%)  Pacemaker  Malnutrition  Anemia of CKD  GERD  -HIDA positive for acute cholecystitis  -Perc chole drain today as the patient is not a surgical candidate.  May have clears from our standpoint after the procedure and advance as tolerated.  -Normal white count, afebrile, Unasyn D#2  -needs to be mobilized -further management per primary team  I updated her son at bedside  Erby Pian, [facility] Pager [phone](7A-4:30P)   08/24/2014  10:11 AM

## 2014-08-24 NOTE — Progress Notes (Signed)
Subjective:  Patient denies any chest pain states breathing has improved but continues to have coughing denies abdominal pain nausea vomiting states overall feels better. Cholecystostomy was canceled and opted for medical management for now as she continues to improve with IV antibiotics. Tolerating angiotensin receptor blocker, beta blocker and aldosterone inhibitors.  Objective:  Vital Signs in the last 24 hours: Temp:  [98 F (36.7 C)-98.8 F (37.1 C)] 98 F (36.7 C) (10/21 1346) Pulse Rate:  [66-97] 97 (10/21 1346) Resp:  [20] 20 (10/21 1346) BP: (108-138)/(50-66) 108/66 mmHg (10/21 1346) SpO2:  [96 %-97 %] 96 % (10/21 1346) Weight:  [48 kg (105 lb 13.1 oz)] 48 kg (105 lb 13.1 oz) (10/21 0511)  Intake/Output from previous day: 10/20 0701 - 10/21 0700 In: 385 [P.O.:120; I.V.:165; IV Piggyback:100] Out: 3400 [Urine:3400] Intake/Output from this shift: Total I/O In: -  Out: 700 [Urine:700]  Physical Exam: Neck: no adenopathy, no carotid bruit, no JVD and supple, symmetrical, trachea midline Lungs: Decreased breath sound at bases with faint rales and occasional rhonchi Heart: regular rate and rhythm, S1, S2 normal and 3/6 systolic murmur noted and soft S3 gallop noted Abdomen: soft, non-tender; bowel sounds normal; no masses,  no organomegaly Extremities: extremities normal, atraumatic, no cyanosis or edema  Lab Results:  Recent Labs  08/23/14 1140 08/24/14 0407  WBC 7.6 6.9  HGB 10.1* 10.1*  PLT 265 287    Recent Labs  08/23/14 0448 08/24/14 0407  NA 150* 144  K 4.3 3.4*  CL 108 100  CO2 23 29  GLUCOSE 82 81  BUN 37* 33*  CREATININE 1.88* 1.74*   No results found for this basename: TROPONINI, CK, MB,  in the last 72 hours Hepatic Function Panel  Recent Labs  08/24/14 0407  PROT 5.5*  ALBUMIN 3.1*  AST 18  ALT 23  ALKPHOS 141*  BILITOT 1.0   No results found for this basename: CHOL,  in the last 72 hours No results found for this basename: PROTIME,   in the last 72 hours  Imaging: Imaging results have been reviewed and Dg Chest 2 View  08/24/2014   CLINICAL DATA:  Cough, congestive heart failure.  EXAM: CHEST  2 VIEW  COMPARISON:  August 21, 2014.  FINDINGS: Stable cardiomegaly. Status post right shoulder arthroplasty. Right-sided defibrillator is unchanged in position. Severe degenerative joint disease of left glenohumeral joint is again noted. No pneumothorax is noted. Stable bilateral pleural effusions are noted with left greater than right. Stable central pulmonary vascular congestion is noted. Stable right basilar opacity is noted concerning for possible pneumonia. Left basilar opacity is again noted concerning for pneumonia or atelectasis.  IMPRESSION: Stable bilateral pleural effusions with left greater than right. Stable right basilar opacity is noted concerning for possible pneumonia. Stable left basilar opacity is noted concerning for pneumonia or atelectasis.   Electronically Signed   By: Sabino Dick M.D.   On: 08/24/2014 12:35    Cardiac Studies:  Assessment/Plan:  Resolving acute cholecystitis  Coronary artery disease history of anteroseptal wall MI in the past  Resolving Decompensated congestive heart failure secondary to systolic/diastolic dysfunction and valvular heart disease  Resolving bilateral pneumonia Severe pulmonary hypertension  Hypertension  Diabetes mellitus  Chronic kidney disease  Hypercholesteremia  Osteoarthritis  Osteoporosis  Tachybradycardia syndrome status post permanent pacemaker in the past  History of atrial tachycardia arrhythmias and atrial fibrillation in the past  History of syncope in the past Plan Continue present management I will Sign off please  call if needed  LOS: 3 days    Dajiah Kooi N 08/24/2014, 6:14 PM

## 2014-08-25 DIAGNOSIS — K810 Acute cholecystitis: Secondary | ICD-10-CM

## 2014-08-25 LAB — COMPREHENSIVE METABOLIC PANEL
ALK PHOS: 122 U/L — AB (ref 39–117)
ALT: 18 U/L (ref 0–35)
AST: 15 U/L (ref 0–37)
Albumin: 3 g/dL — ABNORMAL LOW (ref 3.5–5.2)
Anion gap: 11 (ref 5–15)
BILIRUBIN TOTAL: 1.1 mg/dL (ref 0.3–1.2)
BUN: 26 mg/dL — AB (ref 6–23)
CHLORIDE: 99 meq/L (ref 96–112)
CO2: 32 mEq/L (ref 19–32)
Calcium: 8.6 mg/dL (ref 8.4–10.5)
Creatinine, Ser: 1.51 mg/dL — ABNORMAL HIGH (ref 0.50–1.10)
GFR calc non Af Amer: 30 mL/min — ABNORMAL LOW (ref >90)
GFR, EST AFRICAN AMERICAN: 35 mL/min — AB (ref >90)
GLUCOSE: 83 mg/dL (ref 70–99)
POTASSIUM: 3.6 meq/L — AB (ref 3.7–5.3)
SODIUM: 142 meq/L (ref 137–147)
TOTAL PROTEIN: 5.4 g/dL — AB (ref 6.0–8.3)

## 2014-08-25 LAB — CBC
HCT: 32.5 % — ABNORMAL LOW (ref 36.0–46.0)
HEMOGLOBIN: 10.5 g/dL — AB (ref 12.0–15.0)
MCH: 27.3 pg (ref 26.0–34.0)
MCHC: 32.3 g/dL (ref 30.0–36.0)
MCV: 84.4 fL (ref 78.0–100.0)
Platelets: 272 10*3/uL (ref 150–400)
RBC: 3.85 MIL/uL — ABNORMAL LOW (ref 3.87–5.11)
RDW: 16.8 % — AB (ref 11.5–15.5)
WBC: 6.7 10*3/uL (ref 4.0–10.5)

## 2014-08-25 LAB — GLUCOSE, CAPILLARY
GLUCOSE-CAPILLARY: 84 mg/dL (ref 70–99)
Glucose-Capillary: 128 mg/dL — ABNORMAL HIGH (ref 70–99)
Glucose-Capillary: 147 mg/dL — ABNORMAL HIGH (ref 70–99)

## 2014-08-25 MED ORDER — FUROSEMIDE 40 MG PO TABS: 40.0000 mg | ORAL_TABLET | Freq: Every day | ORAL | Status: DC

## 2014-08-25 MED ORDER — ALPRAZOLAM 0.25 MG PO TABS: ORAL_TABLET | ORAL | Status: DC

## 2014-08-25 MED ORDER — POTASSIUM CHLORIDE CRYS ER 20 MEQ PO TBCR: 20.0000 meq | EXTENDED_RELEASE_TABLET | Freq: Every day | ORAL | Status: DC

## 2014-08-25 MED ORDER — GLUCERNA SHAKE PO LIQD
237.0000 mL | Freq: Three times a day (TID) | ORAL | Status: DC
  Administered 2014-08-25 (×2): 237 mL via ORAL
  Filled 2014-08-25 (×3): qty 237

## 2014-08-25 MED ORDER — GLUCERNA SHAKE PO LIQD: 237.0000 mL | Freq: Three times a day (TID) | ORAL | Status: DC

## 2014-08-25 MED ORDER — LOSARTAN POTASSIUM 25 MG PO TABS: 25.0000 mg | ORAL_TABLET | Freq: Every day | ORAL | Status: DC

## 2014-08-25 MED ORDER — METOPROLOL TARTRATE 50 MG PO TABS: 50.0000 mg | ORAL_TABLET | Freq: Two times a day (BID) | ORAL | Status: AC

## 2014-08-25 MED ORDER — AMOXICILLIN-POT CLAVULANATE 500-125 MG PO TABS: 1.0000 | ORAL_TABLET | Freq: Three times a day (TID) | ORAL | Status: DC

## 2014-08-25 NOTE — Progress Notes (Signed)
Patient interviewed and examined, agree with NP note above.  Edward Jolly MD, FACS  08/25/2014 3:29 PM

## 2014-08-25 NOTE — Evaluation (Signed)
Physical Therapy Evaluation Patient Details Name: Sophia Oliver MRN: [id number] DOB: 09/23/1924 Today's Date: 08/25/2014   History of Present Illness  80 yo female admitted with acute resp failure, hypoxia, weakness, cholecystitis. Hx of anxiety, Afib, CHF, DM, pacemaker, HTn, syncope, LBBB, tachycardia. Pt just recently discharged from SNF.   Clinical Impression  On eval, pt required Min assist for mobility-able to ambulate ~75 feet x2 with rolling walker, O2. Demonstrates general weakness, decreased activity tolerance, and impaired gait and balance. Recommend SNF    Follow Up Recommendations SNF;Supervision/Assistance - 24 hour    Equipment Recommendations  None recommended by PT    Recommendations for Other Services       Precautions / Restrictions Precautions Precautions: Fall Precaution Comments: monitor O2 sats, HR Restrictions Weight Bearing Restrictions: No      Mobility  Bed Mobility Overal bed mobility: Needs Assistance Bed Mobility: Supine to Sit     Supine to sit: Min guard     General bed mobility comments: close guard for safety. Increased time.   Transfers Overall transfer level: Needs assistance Equipment used: Rolling walker (2 wheeled) Transfers: Sit to/from Stand Sit to Stand: Min assist         General transfer comment: assist to rise, stabilize, control descent. VCs safety, technique, hand placement  Ambulation/Gait Ambulation/Gait assistance: Min assist Ambulation Distance (Feet): 75 Feet (x2) Assistive device: Rolling walker (2 wheeled) Gait Pattern/deviations: Step-through pattern;Decreased stride length     General Gait Details: slow gait speed. Pt tolerated activity fairly well. Seated rest break needed after 1st 75 feet. Remained on O2.   Stairs            Wheelchair Mobility    Modified Rankin (Stroke Patients Only)       Balance                                             Pertinent Vitals/Pain  Pain Assessment: No/denies pain    Home Living Family/patient expects to be discharged to:: [place]: [place] - 2 wheels;Bedside commode;Wheelchair - manual (oxygen)      Prior Function Level of Independence: Needs assistance               Hand Dominance        Extremity/Trunk Assessment   Upper Extremity Assessment: Generalized weakness           Lower Extremity Assessment: Generalized weakness      Cervical / Trunk Assessment: Kyphotic  Communication      Cognition Arousal/Alertness: Awake/alert Behavior During Therapy: WFL for tasks assessed/performed Overall Cognitive Status: Within Functional Limits for tasks assessed                      General Comments      Exercises        Assessment/Plan    PT Assessment Patient needs continued PT services  PT Diagnosis Difficulty walking;Generalized weakness   PT Problem List Decreased strength;Decreased activity tolerance;Decreased balance;Decreased mobility;Pain;Decreased knowledge of use of DME  PT Treatment Interventions DME instruction;Gait training;Functional mobility training;Therapeutic activities;Therapeutic exercise;Patient/family education;Balance training   PT Goals (Current goals can be found in the Care Plan section) Acute Rehab PT Goals Patient Stated Goal: to get better PT Goal Formulation: With patient/family Time  For Goal Achievement: 09/08/14 Potential to Achieve Goals: Fair    Frequency Min 3X/week   Barriers to discharge        Co-evaluation               End of Session Equipment Utilized During Treatment: Gait belt;Oxygen Activity Tolerance: Patient limited by fatigue Patient left: in chair;with call bell/phone within reach;with chair alarm set           Time: 1119-1140 PT Time Calculation (min): 21 min   Charges:   PT Evaluation $Initial PT Evaluation Tier I: 1 Procedure PT Treatments $Gait Training:  8-22 mins   PT G Codes:          Weston Anna, MPT Pager: [phone]

## 2014-08-25 NOTE — Evaluation (Signed)
Clinical/Bedside Swallow Evaluation Patient Details  Name: Sophia Oliver MRN: [id number] Date of Birth: September 10, 1924  Today's Date: 08/25/2014 Time: 1245-1315 SLP Time Calculation (min): 30 min  Past Medical History:  Past Medical History  Diagnosis Date  . Syncope   . Sinus node dysfunction     a. s/p pacemaker.  . Wide-complex tachycardia   . Cholelithiasis   . Glucose intolerance (impaired glucose tolerance)   . Anxiety   . PONV (postoperative nausea and vomiting)   . Chronic kidney disease     a. Baseline Cr reported 1.8-2.0.  . Injury of right upper arm     after venipuncture in 3/13 - pt describes pain afterwards- no followup done seen by PCP- no further treatment   . PAF (paroxysmal atrial fibrillation)   . Left bundle branch block   . Tachycardia-bradycardia syndrome   . CHF (congestive heart failure)     a. EF 30-35% by echo 2015.  [name] Pacemaker     Guidant, implanted 2006  . Type II diabetes mellitus     "recently dx'd" (03/09/2014)  . Breast cancer 2003    "radiation on right; mastectomy on the left"  . Hypertension   . GERD (gastroesophageal reflux disease)   . DJD (degenerative joint disease)   . Arthritis     "real bad in my lower back" (03/09/2014)  . Chronic lower back pain   . Orthostatic hypotension     a. Diuretics d/c'd 03/2014.  [name] PAT (paroxysmal atrial tachycardia)    Past Surgical History:  Past Surgical History  Procedure Laterality Date  . Replacement total knee bilateral Bilateral   . Total shoulder replacement    . Tonsillectomy    . Orif distal femur fracture Right 12/2007  . Tonsillectomy    . Mastectomy Left 2003  . Total hip arthroplasty  06/16/2012    Procedure: TOTAL HIP ARTHROPLASTY;  Surgeon: Mauri Pole, MD;  Location: [facility];  Service: Orthopedics;  Laterality: Left;  . Hip closed reduction  06/30/2012    Procedure: CLOSED MANIPULATION HIP;  Surgeon: Mauri Pole, MD;  Location: [facility];  Service: Orthopedics;  Laterality: Left;  .  Abdominal hysterectomy    . Pacemaker insertion      Guidant Insignia I  . Cataract extraction w/ intraocular lens  implant, bilateral Bilateral 2000's  . Refractive surgery Bilateral 01/2014  . Inguinal hernia repair Left     "had emergency OR for it a few years ago" (03/09/2014)  . Joint replacement    . Breast biopsy Bilateral    HPI:  80 yo female adm to [facility] with acute respiratory failure- diagnosed with CHF.  Pt has PMH of Acute respiratory failure with hypoxia, CKD (chronic kidney disease), stage III, Pacemaker, PAF (paroxysmal atrial fibrillation), Malnutrition of moderate degree, GERD (gastroesophageal reflux disease), Acute on chronic combined systolic and diastolic CHF (congestive heart failure), Anemia of chronic disease, Cholecystitis, acute.  Surgery was planned but cancelled in favor of medical management for pt's cholecystitis.  Swallow evaluation ordered.     Assessment / Plan / Recommendation Clinical Impression  Pt presents with no focal CN deficits and symptoms that appear consistent with primary esophageal dysphagia.  Observed pt consuming small amount of her lunch - meat, potato and soda.  Chronic throat clearing noted prior to po intake *which pt reports is baseline.     Pt with timely swallow with solids and did not report sensation of stasis in pharynx/esophagus during intake observed.  She  did report that she gets "full quickly" - therefore suspect multifactorial difficulties (lack of appetitie, dysmotility).      Patient reports sensation of something in her throat on left side - most notably when she lays down at night- causing SLP to question esophageal source.     SLP reviewed previous MBS with pt/son and provided compensation strategies.  Advise to consider pt follow up with GI MD as an oupt who saw pt earlier in 2015.       Aspiration Risk  Mild    Diet Recommendation Regular;Thin liquid   Liquid Administration via: Cup;No straw Medication Administration:  Whole meds with puree (start and follow with water) Supervision: Patient able to self feed Compensations: Slow rate;Small sips/bites;Clear throat intermittently Postural Changes and/or Swallow Maneuvers: Seated upright 90 degrees;Upright 30-60 min after meal    Other  Recommendations Oral Care Recommendations: Oral care BID         Pertinent Vitals/Pain Afebrile, decreased      Swallow Study Prior Functional Status   Pt denies reflux issues    General Date of Onset: 08/25/14 HPI: 80 yo female adm to [facility] with acute respiratory failure- diagnosed with CHF.  Pt has PMH of Acute respiratory failure with hypoxia, CKD (chronic kidney disease), stage III, Pacemaker, PAF (paroxysmal atrial fibrillation), Malnutrition of moderate degree, GERD (gastroesophageal reflux disease), Acute on chronic combined systolic and diastolic CHF (congestive heart failure), Anemia of chronic disease, Cholecystitis, acute.  Surgery was planned but cancelled in favor of medical management for pt's cholecystitis.  Swallow evaluation ordered.   Type of Study: Bedside swallow evaluation Previous Swallow Assessment: MBS completed in 2013- + silght aspiration of thin via straw, recommended regular/thin with precautions - throat clearing, esophagram 12/2013 esophageal dysmotility, hiatal hernia, laryngeal penetration of liquids to cords without sensation Diet Prior to this Study: Regular;Thin liquids Temperature Spikes Noted: No Respiratory Status: Room air History of Recent Intubation: No Behavior/Cognition: Alert;Cooperative;Pleasant mood Oral Cavity - Dentition: Adequate natural dentition Self-Feeding Abilities: Able to feed self Patient Positioning: Upright in bed Baseline Vocal Quality: Hoarse;Low vocal intensity (pt reports issues with vocal hoarseness x a few months) Volitional Cough: Strong Volitional Swallow: Able to elicit    Oral/Motor/Sensory Function Overall Oral Motor/Sensory Function: Appears within  functional limits for tasks assessed   Ice Chips Ice chips: Not tested   Thin Liquid Other Comments: throat clearing, cough noted prior to, during and after intake    Nectar Thick Nectar Thick Liquid: Not tested   Honey Thick Honey Thick Liquid: Not tested   Puree Puree: Within functional limits Presentation: Self Fed;Spoon   Solid   GO    Solid: Within functional limits Presentation: Self Fed;Spoon       Luanna Salk, [facility] SLP [phone]

## 2014-08-25 NOTE — Progress Notes (Signed)
Patient is set to discharge to [facility] today. Patient & family at bedside aware. Discharge packet given to RN, Shirlee Limerick. [facility] called for transport.   Clinical Social Work Department CLINICAL SOCIAL WORK PLACEMENT NOTE 08/25/2014  Patient:  Sophia Oliver, Sophia Oliver  Account Number:  [id number] Admit date:  08/21/2014  Clinical Social Worker:  Renold Genta  Date/time:  08/23/2014 03:49 PM  Clinical Social Work is seeking post-discharge placement for this patient at the following level of care:   SKILLED NURSING   (*CSW will update this form in Epic as items are completed)   08/23/2014  Patient/family provided with [facility] Department of Clinical Social Work's list of facilities offering this level of care within the geographic area requested by the patient (or if unable, by the patient's family).  08/23/2014  Patient/family informed of their freedom to choose among providers that offer the needed level of care, that participate in Medicare, Medicaid or managed care program needed by the patient, have an available bed and are willing to accept the patient.  08/23/2014  Patient/family informed of [facility]' ownership interest in [facility], as well as of the fact that they are under no obligation to receive care at this facility.  PASARR submitted to EDS on 08/23/2014 PASARR number received on 08/23/2014  FL2 transmitted to all facilities in geographic area requested by pt/family on  08/23/2014 FL2 transmitted to all facilities within larger geographic area on 08/23/2014  Patient informed that his/her managed care company has contracts with or will negotiate with  certain facilities, including the following:   Medicaid pending     Patient/family informed of bed offers received:  08/25/2014 Patient chooses bed at [facility] [place] Physician recommends and patient chooses bed at    Patient to be transferred to [place] on  08/25/2014 Patient to be transferred to facility by [facility] Patient and family notified of transfer on 08/25/2014 Name of family member notified:  patient's son at bedside  The following physician request were entered in Epic:   Additional Comments:   Raynaldo Opitz, [place] Social Worker cell #: [phone]

## 2014-08-25 NOTE — Progress Notes (Signed)
BSE completed, full report to follow.  Pt continues to present with symptoms of primary esophageal dysphagia as noted on barium swallow 12/2013 (esophageal dysmotility and small hiatal hernia) and MBS in 2013.  Son and pt thoroughly educated to findings, recommendations and strategies to mitigate dysphagia symptoms.    Recommend continue regular/thin diet with precautions.  Also recommend pt consider follow up as outpt with GI MD *who ordered ba swallow 12/2013* given pt's report of ongoing dysphagia.  SLP brief follow up at SNF for dysphagia management would be beneficial.    Luanna Salk, [facility] SLP [phone]

## 2014-08-25 NOTE — Discharge Summary (Signed)
Physician Discharge Summary  Sophia Oliver:[id number] DOB: 14-Aug-1924 DOA: 08/21/2014  PCP: [name], MD  Admit date: 08/21/2014 Discharge date: 08/25/2014  Recommendations for Outpatient Follow-up:  1. F/u with cardiology within 1 week of discharge for follow up of heart failure exacerbation 2. Daily weights, call supervising physician if she gains more than 3-lbs in 1 day or 5-lbs in 1 week 3. 2gm sodium diet 4. Continue oxygen via nasal canula 5. Repeat BMP in 1 week to check creatinine and potassium after starting ARB and potassium supplement and lasix 6. Continue augmentin through 10/28 7. Continue PT/OT   Discharge Diagnoses:  Principal Problem:   Acute respiratory failure with hypoxia Active Problems:   CKD (chronic kidney disease), stage III   Pacemaker   PAF (paroxysmal atrial fibrillation)   Malnutrition of moderate degree   GERD (gastroesophageal reflux disease)   Acute on chronic combined systolic and diastolic CHF (congestive heart failure)   Anemia of chronic disease   Cholecystitis, acute   Discharge Condition: stable, improved  Diet recommendation:  Low sodium  Wt Readings from Last 3 Encounters:  08/25/14 46.8 kg (103 lb 2.8 oz)  08/16/14 53.524 kg (118 lb)  07/01/14 53.933 kg (118 lb 14.4 oz)    History of present illness:  80 year old female with past medical history of systolic and diastolic heart failure with ejection fraction of 15-20%, atrial fibrillation with tachybradycardia syndrome status post pacemaker, chronic kidney disease stage III who was recently discharged from [facility] to home. She presented to the emergency department with shortness of breath and weakness despite being on 3 L nasal cannula at home. In the emergency department, she required 4 L of nasal cannula to keep oxygen levels greater than 88%. The rest of her vital signs were stable. She had a BNP of 37,600 with a negative troponin and stable EKG. She was given Lasix 40 mg IV  once her chest x-ray demonstrated worsening bilateral effusions concerning for heart failure exacerbation.  Hospital Course:   Acute respiratory failure with hypoxia secondary to acute on chronic combined systolic and diastolic congestive heart failure.  Her last echocardiogram was in 5 2015 with an ejection fraction of 15-20%. She was started on Lasix 40 mg IV and cardiology was consulted. Her metoprolol dose was decreased from 75 mg to 50 mg by mouth twice a day and to continue her aspirin, ARB, and spironolactone.  She diuresed well and her weight at the time of discharge is 46.8 kg. She was advised to eat a low-sodium diet as long as she is eating some and use supplements as needed. She should continue her Lasix by mouth as prescribed unless she develops signs of dehydration, diarrhea, vomiting. She should be weighed daily and if he gains more than 3 pounds in one day or 5 pounds in one week, recommend calling the supervising physician. She should follow up with cardiology within one to 2 weeks for repeat weight check and to determine if she needs any further adjustments to her medications. She continues to need approximately 3 L of nasal cannula for chronic respiratory failure related to her heart failure.  Possible acute cholecystitis based on the results of her abdominal ultrasound which demonstrated marked gallbladder wall thickening with cholelithiasis. She follow up HIDA scan which demonstrated no gallbladder enhancement consistent with obstruction of the cystic duct and acute cholecystitis. The patient however, remained afebrile without leukocytosis or significant symptoms of abdominal pain. Cardiology felt that she would be high risk for any  laparoscopic or open cholecystectomy and recommended for percutaneous drainage of her gallbladder. Because of her minimal symptoms and improvement with antibiotics, she did not undergo cutaneous cholecystostomy placement and should instead continue antibiotics  for an additional 7 days after discharge. It is also possible that she had significant edema of the gallbladder wall secondary to her heart failure exacerbation and not an acute infection.  Chronic kidney disease stage III, her creatinine remained stable.  Tachybradycardia syndrome with pacemaker placement. Cardiology felt that her was stable on reduced beta blocker. She will followup with him in clinic in the next 1-2 weeks.  Moderate protein calorie malnutrition. She was seen by nutrition who recommended the addition of supplements. She was RD taking Glucerna once daily at home, recommend day until she is eating more regularly that she increase this to 3 times a day.  Anemia of chronic disease, her hemoglobin remained stable and she did not require blood transfusion.  Acid reflux, stable, continued PPI.    DM type 2, CBG stable.  Not on any home medications.    Consultants:  General Surgery  Cardiology  Radiology Procedures:  - [place] abd  - HIDA  - CXR  Antibiotics:  Unasyn 10/19>>>   Discharge Exam: Filed Vitals:   08/25/14 0609  BP: 132/60  Pulse: 96  Temp: 98.3 F (36.8 C)  Resp: 20   Filed Vitals:   08/24/14 0511 08/24/14 1346 08/24/14 2052 08/25/14 0609  BP: 125/50 108/66 133/59 132/60  Pulse: 66 97 57 96  Temp: 98.8 F (37.1 C) 98 F (36.7 C) 98.6 F (37 C) 98.3 F (36.8 C)  TempSrc: Oral Oral Oral Axillary  Resp: 20 20 20 20   Height:      Weight: 48 kg (105 lb 13.1 oz)   46.8 kg (103 lb 2.8 oz)  SpO2: 96% 96% 98% 98%    General: Cachectic female, No acute distress, nasal canula in place HEENT: NCAT, MMM  Cardiovascular: RRR, nl S1, S2 no mrg, 2+ pulses, warm extremities  Respiratory: diminished at the bilateral bases, no focal rales or rhonchi or wheeze, no increased WOB  Abdomen: NABS, soft, NT/ND  MSK: Normal tone and bulk, no LEE  Neuro: Grossly intact   Discharge Instructions      Discharge Instructions   (HEART FAILURE PATIENTS) Call MD:   Anytime you have any of the following symptoms: 1) 3 pound weight gain in 24 hours or 5 pounds in 1 week 2) shortness of breath, with or without a dry hacking cough 3) swelling in the hands, feet or stomach 4) if you have to sleep on extra pillows at night in order to breathe.    Complete by:  As directed      Call MD for:  difficulty breathing, headache or visual disturbances    Complete by:  As directed      Call MD for:  extreme fatigue    Complete by:  As directed      Call MD for:  hives    Complete by:  As directed      Call MD for:  persistant dizziness or light-headedness    Complete by:  As directed      Call MD for:  persistant nausea and vomiting    Complete by:  As directed      Call MD for:  severe uncontrolled pain    Complete by:  As directed      Call MD for:  temperature >100.4    Complete  by:  As directed      Diet - low sodium heart healthy    Complete by:  As directed      Discharge instructions    Complete by:  As directed   You were hospitalized with shortness of breath from heart failure exacerbation.  Please try to eat a low salt (less than 2g sodium) diet if possible.  Please have them weigh you and if you gain 3 or more pounds in 1 day or 5 pounds in 1 week, have them notify Dr. Terrence Dupont and Dr. Maudie Mercury.  Your metoprolol dose has been decreased, but you have been started on lasix, a diuretic to get rid of extra fluid, and losartan, a heart failure medications.  Please take potassium supplements.  You will need to have your blood work repeated in about 1 week.  Take augmentin for the next week to treat possible gallbladder infection.     Increase activity slowly    Complete by:  As directed             Medication List         acetaminophen 500 MG tablet  Commonly known as:  TYLENOL  Take 500 mg by mouth every 6 (six) hours as needed (joint pain).     ALPRAZolam 0.25 MG tablet  Commonly known as:  XANAX  Take one tablet by mouth twice daily for anxiety      amoxicillin-clavulanate 500-125 MG per tablet  Commonly known as:  AUGMENTIN  Take 1 tablet (500 mg total) by mouth 3 (three) times daily.     aspirin 81 MG chewable tablet  Chew 1 tablet (81 mg total) by mouth daily.     diclofenac sodium 1 % Gel  Commonly known as:  VOLTAREN  Apply 2 g topically 4 (four) times daily as needed (joint pain).     DSS 100 MG Caps  Take 100 mg by mouth 2 (two) times daily.     feeding supplement (GLUCERNA SHAKE) Liqd  Take 237 mLs by mouth 3 (three) times daily between meals.     furosemide 40 MG tablet  Commonly known as:  LASIX  Take 1 tablet (40 mg total) by mouth daily.     losartan 25 MG tablet  Commonly known as:  COZAAR  Take 1 tablet (25 mg total) by mouth daily.     magic mouthwash Soln  Take 10 mLs by mouth 3 (three) times daily as needed for mouth pain (mouth pain).     metoprolol 50 MG tablet  Commonly known as:  LOPRESSOR  Take 1 tablet (50 mg total) by mouth 2 (two) times daily.     pantoprazole 40 MG tablet  Commonly known as:  PROTONIX  Take 40 mg by mouth daily.     potassium chloride SA 20 MEQ tablet  Commonly known as:  K-DUR,KLOR-CON  Take 1 tablet (20 mEq total) by mouth daily.     spironolactone 25 MG tablet  Commonly known as:  ALDACTONE  Take 25 mg by mouth daily.       Follow-up Information   Follow up with [name], MD. Schedule an appointment as soon as possible for a visit in 2 weeks.   Specialty:  Internal Medicine   Contact information:   [address] [place] [address] [phone]       Follow up with Clent Demark, MD. Schedule an appointment as soon as possible for a visit in 2 weeks.  Specialty:  Cardiology   Contact information:   [place] [address] [phone]        The results of significant diagnostics from this hospitalization (including imaging, microbiology, ancillary and laboratory) are listed below for reference.     Significant Diagnostic Studies: Dg Chest 2 View  08/24/2014   CLINICAL DATA:  Cough, congestive heart failure.  EXAM: CHEST  2 VIEW  COMPARISON:  August 21, 2014.  FINDINGS: Stable cardiomegaly. Status post right shoulder arthroplasty. Right-sided defibrillator is unchanged in position. Severe degenerative joint disease of left glenohumeral joint is again noted. No pneumothorax is noted. Stable bilateral pleural effusions are noted with left greater than right. Stable central pulmonary vascular congestion is noted. Stable right basilar opacity is noted concerning for possible pneumonia. Left basilar opacity is again noted concerning for pneumonia or atelectasis.  IMPRESSION: Stable bilateral pleural effusions with left greater than right. Stable right basilar opacity is noted concerning for possible pneumonia. Stable left basilar opacity is noted concerning for pneumonia or atelectasis.   Electronically Signed   By: Sabino Dick M.D.   On: 08/24/2014 12:35   Dg Chest 2 View  08/21/2014   CLINICAL DATA:  Shortness of breath and cough for 1 month.  EXAM: CHEST  2 VIEW  COMPARISON:  PA and lateral chest 06/29/2014.  FINDINGS: Left worse than right effusions and airspace disease persist. Aeration right lung base appears slightly improved. There is cardiomegaly. No pneumothorax. Pacing device remains in place. Right shoulder replacement is noted. Severe degenerative disease left shoulder is unchanged.  IMPRESSION: Persistent left worse than right effusions and airspace disease. Aeration in the right lung base appears slightly improved. No new abnormality.  Cardiomegaly.   Electronically Signed   By: Inge Rise M.D.   On: 08/21/2014 17:35   Nm Hepatobiliary Including Gb  08/22/2014   CLINICAL DATA:  Abdominal pain. Current ultrasound showed cholelithiasis and significant gallbladder wall thickening and edema.  EXAM: NUCLEAR MEDICINE HEPATOBILIARY IMAGING  TECHNIQUE: Sequential images of the abdomen  were obtained out to 60 minutes following intravenous administration of radiopharmaceutical.  RADIOPHARMACEUTICALS:  5.5 [place] NU-27O Choletec  COMPARISON:  Current abdominal ultrasound  FINDINGS: There is prompt and homogeneous accumulation of radiotracer by the liver. Prompt excretion was seen into the intra and extrahepatic biliary tree. Small bowel activity was noted during the mid portion of the exam.  Imaging over 1 hr demonstrated no radiotracer activity within the gallbladder consistent with an obstructed cystic duct.  IMPRESSION: 1. Nonvisualized gallbladder consistent with obstruction of the cystic duct. This supports the diagnosis of acute cholecystitis.   Electronically Signed   By: Lajean Manes M.D.   On: 08/22/2014 16:42   US Abdomen Limited  08/22/2014   ADDENDUM REPORT: 08/22/2014 09:43  ADDENDUM: A small right pleural effusion is also present.   Electronically Signed   By: Lawrence Santiago M.D.   On: 08/22/2014 09:43   08/22/2014   CLINICAL DATA:  Abnormal LFTs  EXAM: US ABDOMEN LIMITED - RIGHT UPPER QUADRANT  COMPARISON:  CT abdomen and pelvis 01/23/2012.  FINDINGS: Gallbladder:  Multiple enlarged shadowing stones are present bilaterally scratch the multiple enlarged shadowing stones are present there is marked wall thickening, measuring up to 1.6 cm. No sonographic Percell Miller sign is present. There is no pericholecystic fluid.  Common bile duct:  Diameter: The common bile duct measures 4 mm. Echogenic material is seen within the duct which may represent a small stone. Mild intrahepatic dilation  is suggested.  Liver:  No focal lesion identified. Within normal limits in parenchymal echogenicity.  IMPRESSION: 1. Cholelithiasis with marked gallbladder wall thickening, measuring up to 16 mm. Although there is no sonographic Murphy sign, findings are concerning for cholecystitis. 2. Echogenic material within the common bile duct is concerning for a nonobstructing stone. The duct measures 4 mm.   Electronically Signed: By: Lawrence Santiago M.D. On: 08/22/2014 09:21    Microbiology: No results found for this or any previous visit (from the past 240 hour(s)).   Labs: Basic Metabolic Panel:  Recent Labs Lab 08/21/14 1655 08/22/14 0445 08/23/14 0448 08/24/14 0407 08/25/14 0340  NA 139 139 150* 144 142  K 4.9 4.7 4.3 3.4* 3.6*  CL 103 103 108 100 99  CO2 20 20 23 29  32  GLUCOSE 204* 141* 82 81 83  BUN 36* 37* 37* 33* 26*  CREATININE 1.59* 1.80* 1.88* 1.74* 1.51*  CALCIUM 9.1 8.8 9.2 8.7 8.6  MG  --   --  1.9  --   --    Liver Function Tests:  Recent Labs Lab 08/21/14 1655 08/23/14 0448 08/24/14 0407 08/25/14 0340  AST 105* 21 18 15   ALT 53* 29 23 18   ALKPHOS 223* 160* 141* 122*  BILITOT 0.9 0.8 1.0 1.1  PROT 6.0 5.8* 5.5* 5.4*  ALBUMIN 3.4* 3.2* 3.1* 3.0*   No results found for this basename: LIPASE, AMYLASE,  in the last 168 hours No results found for this basename: AMMONIA,  in the last 168 hours CBC:  Recent Labs Lab 08/21/14 1655 08/23/14 1140 08/24/14 0407 08/25/14 0340  WBC 7.9 7.6 6.9 6.7  NEUTROABS 6.4  --   --   --   HGB 10.6* 10.1* 10.1* 10.5*  HCT 33.3* 31.4* 30.8* 32.5*  MCV 87.2 85.8 83.5 84.4  PLT 281 265 287 272   Cardiac Enzymes:  Recent Labs Lab 08/21/14 1655  TROPONINI <0.30   BNP: BNP (last 3 results)  Recent Labs  06/24/14 1113 08/21/14 1655 08/21/14 2139  PROBNP 16090.0* 37601.0* 42122.0*   CBG:  Recent Labs Lab 08/24/14 0756 08/24/14 1136 08/24/14 1711 08/24/14 2100 08/25/14 0752  GLUCAP 97 78 128* 115* 84    Time coordinating discharge: 45 minutes  Signed:  Alainna Stawicki  [facility] 08/25/2014, 10:53 AM

## 2014-08-25 NOTE — Progress Notes (Signed)
RN at bedside monitoring tele due to intermittent telemetry downtime. Szymon Foiles A

## 2014-08-25 NOTE — Progress Notes (Signed)
Patient ID: Sophia Oliver, female   DOB: [birth year], 80 y.o.   MRN: [id number]     [facility]      [address] [place], [place] [address]    Phone: [phone] FAX: [phone]     Subjective: No pain, no n/v.  Tolerating fulls. Afebrile.  VSS.    Objective:  Vital signs:  Filed Vitals:   08/24/14 0511 08/24/14 1346 08/24/14 2052 08/25/14 0609  BP: 125/50 108/66 133/59 132/60  Pulse: 66 97 57 96  Temp: 98.8 F (37.1 C) 98 F (36.7 C) 98.6 F (37 C) 98.3 F (36.8 C)  TempSrc: Oral Oral Oral Axillary  Resp: '20 20 20 20  ' Height:      Weight: 105 lb 13.1 oz (48 kg)   103 lb 2.8 oz (46.8 kg)  SpO2: 96% 96% 98% 98%    Last BM Date: 08/19/14  Intake/Output   Yesterday:  10/21 0701 - 10/22 0700 In: 240 [P.O.:240] Out: 1100 [Urine:1100] This shift:    I/O last 3 completed shifts: In: 505 [P.O.:240; I.V.:165; IV Piggyback:100] Out: 2300 [Urine:2300]    Physical Exam:  General: Pt awake/alert/oriented x4 in no acute distress  Abdomen: Soft. Nondistended. Non tender. No evidence of peritonitis. No incarcerated hernias.   Problem List:   Principal Problem:   Acute respiratory failure with hypoxia Active Problems:   CKD (chronic kidney disease), stage III   Pacemaker   PAF (paroxysmal atrial fibrillation)   Malnutrition of moderate degree   GERD (gastroesophageal reflux disease)   Acute on chronic combined systolic and diastolic CHF (congestive heart failure)   Anemia of chronic disease   Cholecystitis, acute    Results:   Labs: Results for orders placed during the hospital encounter of 08/21/14 (from the past 48 hour(s))  CBC     Status: Abnormal   Collection Time    08/23/14 11:40 AM      Result Value Ref Range   WBC 7.6  4.0 - 10.5 K/uL   RBC 3.66 (*) 3.87 - 5.11 MIL/uL   Hemoglobin 10.1 (*) 12.0 - 15.0 g/dL   HCT 31.4 (*) 36.0 - 46.0 %   MCV 85.8  78.0 - 100.0 fL   MCH 27.6  26.0 - 34.0 pg   MCHC  32.2  30.0 - 36.0 g/dL   RDW 16.6 (*) 11.5 - 15.5 %   Platelets 265  150 - 400 K/uL  PROTIME-INR     Status: Abnormal   Collection Time    08/23/14 11:40 AM      Result Value Ref Range   Prothrombin Time 17.1 (*) 11.6 - 15.2 seconds   INR 1.38  0.00 - 1.49  APTT     Status: None   Collection Time    08/23/14 11:40 AM      Result Value Ref Range   aPTT 28  24 - 37 seconds  GLUCOSE, CAPILLARY     Status: None   Collection Time    08/23/14 12:10 PM      Result Value Ref Range   Glucose-Capillary 92  70 - 99 mg/dL  GLUCOSE, CAPILLARY     Status: None   Collection Time    08/23/14  4:56 PM      Result Value Ref Range   Glucose-Capillary 75  70 - 99 mg/dL  GLUCOSE, CAPILLARY     Status: Abnormal   Collection Time    08/23/14  9:24 PM  Result Value Ref Range   Glucose-Capillary 122 (*) 70 - 99 mg/dL  CBC     Status: Abnormal   Collection Time    08/24/14  4:07 AM      Result Value Ref Range   WBC 6.9  4.0 - 10.5 K/uL   RBC 3.69 (*) 3.87 - 5.11 MIL/uL   Hemoglobin 10.1 (*) 12.0 - 15.0 g/dL   HCT 30.8 (*) 36.0 - 46.0 %   MCV 83.5  78.0 - 100.0 fL   MCH 27.4  26.0 - 34.0 pg   MCHC 32.8  30.0 - 36.0 g/dL   RDW 16.6 (*) 11.5 - 15.5 %   Platelets 287  150 - 400 K/uL  COMPREHENSIVE METABOLIC PANEL     Status: Abnormal   Collection Time    08/24/14  4:07 AM      Result Value Ref Range   Sodium 144  137 - 147 mEq/L   Potassium 3.4 (*) 3.7 - 5.3 mEq/L   Comment: DELTA CHECK NOTED     REPEATED TO VERIFY   Chloride 100  96 - 112 mEq/L   CO2 29  19 - 32 mEq/L   Glucose, Bld 81  70 - 99 mg/dL   BUN 33 (*) 6 - 23 mg/dL   Creatinine, Ser 1.74 (*) 0.50 - 1.10 mg/dL   Calcium 8.7  8.4 - 10.5 mg/dL   Total Protein 5.5 (*) 6.0 - 8.3 g/dL   Albumin 3.1 (*) 3.5 - 5.2 g/dL   AST 18  0 - 37 U/L   ALT 23  0 - 35 U/L   Alkaline Phosphatase 141 (*) 39 - 117 U/L   Total Bilirubin 1.0  0.3 - 1.2 mg/dL   GFR calc non Af Amer 25 (*) >90 mL/min   GFR calc Af Amer 29 (*) >90 mL/min    Comment: (NOTE)     The eGFR has been calculated using the CKD EPI equation.     This calculation has not been validated in all clinical situations.     eGFR's persistently <90 mL/min signify possible Chronic Kidney     Disease.   Anion gap 15  5 - 15  GLUCOSE, CAPILLARY     Status: None   Collection Time    08/24/14  7:56 AM      Result Value Ref Range   Glucose-Capillary 97  70 - 99 mg/dL  GLUCOSE, CAPILLARY     Status: None   Collection Time    08/24/14 11:36 AM      Result Value Ref Range   Glucose-Capillary 78  70 - 99 mg/dL  GLUCOSE, CAPILLARY     Status: Abnormal   Collection Time    08/24/14  5:11 PM      Result Value Ref Range   Glucose-Capillary 128 (*) 70 - 99 mg/dL  GLUCOSE, CAPILLARY     Status: Abnormal   Collection Time    08/24/14  9:00 PM      Result Value Ref Range   Glucose-Capillary 115 (*) 70 - 99 mg/dL   Comment 1 Documented in Chart     Comment 2 Notify RN    COMPREHENSIVE METABOLIC PANEL     Status: Abnormal   Collection Time    08/25/14  3:40 AM      Result Value Ref Range   Sodium 142  137 - 147 mEq/L   Potassium 3.6 (*) 3.7 - 5.3 mEq/L   Chloride 99  96 - 112  mEq/L   CO2 32  19 - 32 mEq/L   Glucose, Bld 83  70 - 99 mg/dL   BUN 26 (*) 6 - 23 mg/dL   Creatinine, Ser 1.51 (*) 0.50 - 1.10 mg/dL   Calcium 8.6  8.4 - 10.5 mg/dL   Total Protein 5.4 (*) 6.0 - 8.3 g/dL   Albumin 3.0 (*) 3.5 - 5.2 g/dL   AST 15  0 - 37 U/L   ALT 18  0 - 35 U/L   Alkaline Phosphatase 122 (*) 39 - 117 U/L   Total Bilirubin 1.1  0.3 - 1.2 mg/dL   GFR calc non Af Amer 30 (*) >90 mL/min   GFR calc Af Amer 35 (*) >90 mL/min   Comment: (NOTE)     The eGFR has been calculated using the CKD EPI equation.     This calculation has not been validated in all clinical situations.     eGFR's persistently <90 mL/min signify possible Chronic Kidney     Disease.   Anion gap 11  5 - 15  CBC     Status: Abnormal   Collection Time    08/25/14  3:40 AM      Result Value Ref Range    WBC 6.7  4.0 - 10.5 K/uL   RBC 3.85 (*) 3.87 - 5.11 MIL/uL   Hemoglobin 10.5 (*) 12.0 - 15.0 g/dL   HCT 32.5 (*) 36.0 - 46.0 %   MCV 84.4  78.0 - 100.0 fL   MCH 27.3  26.0 - 34.0 pg   MCHC 32.3  30.0 - 36.0 g/dL   RDW 16.8 (*) 11.5 - 15.5 %   Platelets 272  150 - 400 K/uL  GLUCOSE, CAPILLARY     Status: None   Collection Time    08/25/14  7:52 AM      Result Value Ref Range   Glucose-Capillary 84  70 - 99 mg/dL    Imaging / Studies: Dg Chest 2 View  08/24/2014   CLINICAL DATA:  Cough, congestive heart failure.  EXAM: CHEST  2 VIEW  COMPARISON:  August 21, 2014.  FINDINGS: Stable cardiomegaly. Status post right shoulder arthroplasty. Right-sided defibrillator is unchanged in position. Severe degenerative joint disease of left glenohumeral joint is again noted. No pneumothorax is noted. Stable bilateral pleural effusions are noted with left greater than right. Stable central pulmonary vascular congestion is noted. Stable right basilar opacity is noted concerning for possible pneumonia. Left basilar opacity is again noted concerning for pneumonia or atelectasis.  IMPRESSION: Stable bilateral pleural effusions with left greater than right. Stable right basilar opacity is noted concerning for possible pneumonia. Stable left basilar opacity is noted concerning for pneumonia or atelectasis.   Electronically Signed   By: Sabino Dick M.D.   On: 08/24/2014 12:35    Medications / Allergies:  Scheduled Meds: . ampicillin-sulbactam (UNASYN) IV  1.5 g Intravenous Q12H  . antiseptic oral rinse  7 mL Mouth Rinse BID  . aspirin EC  81 mg Oral Daily  . docusate sodium  100 mg Oral BID  . furosemide  40 mg Oral Daily  . insulin aspart  0-9 Units Subcutaneous TID WC  . losartan  25 mg Oral Daily  . metoprolol  50 mg Oral BID  . pantoprazole  40 mg Oral Daily  . potassium chloride  20 mEq Oral Daily  . sodium chloride  3 mL Intravenous Q12H  . spironolactone  25 mg Oral Daily   Continuous  Infusions:  PRN Meds:.sodium chloride, acetaminophen, ALPRAZolam, diclofenac sodium, lip balm, magic mouthwash, ondansetron (ZOFRAN) IV, sodium chloride  Antibiotics: Anti-infectives   Start     Dose/Rate Route Frequency Ordered Stop   08/22/14 1430  ampicillin-sulbactam (UNASYN) 1.5 g in sodium chloride 0.9 % 50 mL IVPB     1.5 g 100 mL/hr over 30 Minutes Intravenous Every 12 hours 08/22/14 1358         Assessment/Plan  Cholelithiasis with cholecystitis  Elevated alk phos and ALT/AST  CHF, acute on chronic combined systolic/diastolic (EF 34-74%)  Pacemaker  Malnutrition  Anemia of CKD  GERD  -HIDA positive for acute cholecystitis 10/19, perc drain scheduled 10/21 [place] did not appreciate significant wall thickening as previously and therefore aborted.  Not a surgical candidate.  -tolerating FL diet and asymptomatic, afebrile, normal white count.  Advance to a low fat diet -needs 7-10 days of antibiotics, may change to PO from our standpoint.  -needs to be mobilized  -further management per primary team   I updated her son at bedside   Erby Pian, [facility] Surgery Pager [phone])   08/25/2014 9:23 AM

## 2014-08-25 NOTE — Evaluation (Addendum)
Occupational Therapy Evaluation Patient Details Name: Sophia Oliver MRN: [id number] DOB: 03-11-1924 Today's Date: 08/25/2014    History of Present Illness 80 yo female admitted with acute resp failure, hypoxia, weakness, cholecystitis. Hx of anxiety, Afib, CHF, DM, pacemaker, HTn, syncope, LBBB, tachycardia. Pt just recently discharged from SNF.    Clinical Impression   Pt planning for SNF at d/c. Will follow on acute to progress ADL independence. Pt states she feels weak from being in the hospital and was happy to sit up in the chair longer when OT finished to help herself get stronger. Pt is motivated. Sats on RA at rest 100% .    Follow Up Recommendations  SNF;Supervision/Assistance - 24 hour    Equipment Recommendations   (defer to next venue)    Recommendations for Other Services       Precautions / Restrictions Precautions Precautions: Fall Precaution Comments: monitor O2 sats, HR Restrictions Weight Bearing Restrictions: No      Mobility Bed Mobility             Transfers Overall transfer level: Needs assistance Equipment used: Rolling walker (2 wheeled) Transfers: Sit to/from Stand Sit to Stand: Min guard         General transfer comment: close guard for safety.    Balance                                            ADL Overall ADL's : Needs assistance/impaired Eating/Feeding: Independent;Sitting   Grooming: Wash/dry hands;Brushing hair;Minimal assistance;Sitting   Upper Body Bathing: Set up;Sitting;Supervision/ safety   Lower Body Bathing: Minimal assistance;Sit to/from stand   Upper Body Dressing : Set up;Sitting   Lower Body Dressing: Minimal assistance;Sit to/from stand   Toilet Transfer: Designer, television/film set Details (indicate cue type and reason): sit to stand only today. Toileting- Clothing Manipulation and Hygiene: Minimal assistance;Sit to/from stand         General ADL Comments: Pt just off bedpan  from chair with nursing tech when OT arrived. Pt min guard to stand from chair. She is able to cross LEs up for LB dressing. She is limited with R UE shoulder flexion to about 120 degrees with effort. She states she usually gets cortisone injections in shoulder.      Vision                     Perception     Praxis      Pertinent Vitals/Pain Pain Assessment: No/denies pain     Hand Dominance     Extremity/Trunk Assessment Upper Extremity Assessment Upper Extremity Assessment: Generalized weakness      Cervical / Trunk Assessment Cervical / Trunk Assessment: Kyphotic   Communication Communication Communication: No difficulties   Cognition Arousal/Alertness: Awake/alert Behavior During Therapy: WFL for tasks assessed/performed Overall Cognitive Status: Within Functional Limits for tasks assessed                     General Comments       Exercises       Shoulder Instructions      Home Living Family/patient expects to be discharged to:: Skilled nursing facility Living Arrangements: Spouse/significant other                           Home Equipment: Walker - 2 wheels;Bedside  commode;Wheelchair - manual          Prior Functioning/Environment Level of Independence: Needs assistance             OT Diagnosis: Generalized weakness   OT Problem List: Decreased strength;Decreased knowledge of use of DME or AE   OT Treatment/Interventions: Self-care/ADL training;Patient/family education;Therapeutic activities;DME and/or AE instruction    OT Goals(Current goals can be found in the care plan section) Acute Rehab OT Goals Patient Stated Goal: to get better OT Goal Formulation: With patient Time For Goal Achievement: 09/08/14 Potential to Achieve Goals: Good  OT Frequency: Min 2X/week   Barriers to D/C:            Co-evaluation              End of Session Equipment Utilized During Treatment: Rolling  walker;Oxygen  Activity Tolerance: Patient tolerated treatment well Patient left: in chair;with call bell/phone within reach   Time: 1354-1406 OT Time Calculation (min): 12 min Charges:  OT General Charges $OT Visit: 1 Procedure OT Evaluation $Initial OT Evaluation Tier I: 1 Procedure OT Treatments $Self Care/Home Management : 8-22 mins G-Codes:    Jules Schick [phone] 08/25/2014, 2:43 PM

## 2014-08-25 NOTE — Progress Notes (Signed)
Discharge to [facility], Report given to [facility] RN. EJ  Removed by this RN, no s/s of infiltration or swelling noted. P/u by PTAR.

## 2014-08-30 ENCOUNTER — Encounter: Payer: Self-pay | Admitting: Internal Medicine

## 2014-08-30 ENCOUNTER — Non-Acute Institutional Stay (SKILLED_NURSING_FACILITY): Payer: Medicare Other | Admitting: Internal Medicine

## 2014-08-30 DIAGNOSIS — N183 Chronic kidney disease, stage 3 unspecified: Secondary | ICD-10-CM

## 2014-08-30 DIAGNOSIS — N058 Unspecified nephritic syndrome with other morphologic changes: Secondary | ICD-10-CM

## 2014-08-30 DIAGNOSIS — E440 Moderate protein-calorie malnutrition: Secondary | ICD-10-CM

## 2014-08-30 DIAGNOSIS — K810 Acute cholecystitis: Secondary | ICD-10-CM

## 2014-08-30 DIAGNOSIS — I480 Paroxysmal atrial fibrillation: Secondary | ICD-10-CM

## 2014-08-30 DIAGNOSIS — D638 Anemia in other chronic diseases classified elsewhere: Secondary | ICD-10-CM

## 2014-08-30 DIAGNOSIS — K219 Gastro-esophageal reflux disease without esophagitis: Secondary | ICD-10-CM

## 2014-08-30 DIAGNOSIS — Z950 Presence of cardiac pacemaker: Secondary | ICD-10-CM

## 2014-08-30 DIAGNOSIS — E1129 Type 2 diabetes mellitus with other diabetic kidney complication: Secondary | ICD-10-CM

## 2014-08-30 DIAGNOSIS — I5042 Chronic combined systolic (congestive) and diastolic (congestive) heart failure: Secondary | ICD-10-CM

## 2014-08-30 NOTE — Progress Notes (Signed)
Patient ID: Sophia Oliver, female   DOB: [birth year], 80 y.o.   MRN: [id number]  Provider:  Rexene Edison. Mariea Clonts, D.O., C.M.D. Location:  [facility]  PCP: [name], MD  Code Status: DNR  Allergies  Allergen Reactions  . Codeine Nausea And Vomiting  . Hydrocodone-Acetaminophen Itching  . Other Nausea And Vomiting    Narcotics  dizziness    Chief Complaint  Patient presents with  . New Admit To SNF    HPI: 80 y.o. female was sent here for short term rehab after being sent out from [facility] with acute respiratory failure.  She was initially hypoxic and required oxygen, significant diuresis.  She had edema that even involved her gallbladder.  She was thought to possibly have cholecystitis, and did have gallstones on [place], but no significant obstruction on hida scan.  She and doctors chose not to pursue surgery or drainage of the duct as she had minimal pain and did not desire this intervention.  She is completing her course of antibiotics (7 more days from admission).  She admits to some ongoing dyspnea at this time, but otherwise feels well and is eager to get home to her husband who is lonely there alone after many years of marriage.  Review of her imaging showed she did leave the hospital with persistent small effusions bilaterally, and I still hear rales at her bases today. Her hospital discharge weight was 46.8kg.     ROS: Review of Systems  Constitutional: Positive for malaise/fatigue. Negative for fever.  HENT: Negative for congestion.   Eyes: Negative for blurred vision.       Glasses  Respiratory: Positive for shortness of breath. Negative for cough and sputum production.   Cardiovascular: Negative for chest pain and leg swelling.       Says legs were quite swollen, but not at all now  Gastrointestinal: Negative for heartburn, nausea, vomiting, abdominal pain, diarrhea, constipation, blood in stool and melena.  Genitourinary: Negative for dysuria.    Musculoskeletal: Positive for back pain. Negative for falls and myalgias.  Skin: Negative for rash.  Neurological: Positive for tingling, sensory change and weakness. Negative for dizziness and headaches.       To left hand off and on for no clear reason--discussed causes and advised could be worked up outpatient  Endo/Heme/Allergies: Bruises/bleeds easily.  Psychiatric/Behavioral: Negative for memory loss.     Past Medical History  Diagnosis Date  . Syncope   . Sinus node dysfunction     a. s/p pacemaker.  . Wide-complex tachycardia   . Cholelithiasis   . Glucose intolerance (impaired glucose tolerance)   . Anxiety   . PONV (postoperative nausea and vomiting)   . Chronic kidney disease     a. Baseline Cr reported 1.8-2.0.  . Injury of right upper arm     after venipuncture in 3/13 - pt describes pain afterwards- no followup done seen by PCP- no further treatment   . PAF (paroxysmal atrial fibrillation)   . Left bundle branch block   . Tachycardia-bradycardia syndrome   . CHF (congestive heart failure)     a. EF 30-35% by echo 2015.  [name] Pacemaker     Guidant, implanted 2006  . Type II diabetes mellitus     "recently dx'd" (03/09/2014)  . Breast cancer 2003    "radiation on right; mastectomy on the left"  . Hypertension   . GERD (gastroesophageal reflux disease)   . DJD (degenerative joint disease)   .  Arthritis     "real bad in my lower back" (03/09/2014)  . Chronic lower back pain   . Orthostatic hypotension     a. Diuretics d/c'd 03/2014.  [name] PAT (paroxysmal atrial tachycardia)    Past Surgical History  Procedure Laterality Date  . Replacement total knee bilateral Bilateral   . Total shoulder replacement    . Tonsillectomy    . Orif distal femur fracture Right 12/2007  . Tonsillectomy    . Mastectomy Left 2003  . Total hip arthroplasty  06/16/2012    Procedure: TOTAL HIP ARTHROPLASTY;  Surgeon: Mauri Pole, MD;  Location: [facility];  Service: Orthopedics;  Laterality:  Left;  . Hip closed reduction  06/30/2012    Procedure: CLOSED MANIPULATION HIP;  Surgeon: Mauri Pole, MD;  Location: [facility];  Service: Orthopedics;  Laterality: Left;  . Abdominal hysterectomy    . Pacemaker insertion      Guidant Insignia I  . Cataract extraction w/ intraocular lens  implant, bilateral Bilateral 2000's  . Refractive surgery Bilateral 01/2014  . Inguinal hernia repair Left     "had emergency OR for it a few years ago" (03/09/2014)  . Joint replacement    . Breast biopsy Bilateral    Social History:   reports that she has quit smoking. Her smoking use included Cigarettes. She has a 3 pack-year smoking history. She has never used smokeless tobacco. She reports that she drinks alcohol. She reports that she does not use illicit drugs.  Family History  Problem Relation Age of Onset  . Hypertension      Medications: Patient's Medications  New Prescriptions   No medications on file  Previous Medications   ACETAMINOPHEN (TYLENOL) 500 MG TABLET    Take 500 mg by mouth every 6 (six) hours as needed (joint pain).   ALPRAZOLAM (XANAX) 0.25 MG TABLET    Take one tablet by mouth twice daily for anxiety   ALUM & MAG HYDROXIDE-SIMETH (MAGIC MOUTHWASH) SOLN    Take 10 mLs by mouth 3 (three) times daily as needed for mouth pain (mouth pain).   ASPIRIN 81 MG CHEWABLE TABLET    Chew 1 tablet (81 mg total) by mouth daily.   DICLOFENAC SODIUM (VOLTAREN) 1 % GEL    Apply 2 g topically 4 (four) times daily as needed (joint pain).   DOCUSATE SODIUM 100 MG CAPS    Take 100 mg by mouth 2 (two) times daily.   FEEDING SUPPLEMENT, GLUCERNA SHAKE, (GLUCERNA SHAKE) LIQD    Take 237 mLs by mouth 3 (three) times daily between meals.   FUROSEMIDE (LASIX) 40 MG TABLET    Take 1 tablet (40 mg total) by mouth daily.   LOSARTAN (COZAAR) 25 MG TABLET    Take 1 tablet (25 mg total) by mouth daily.   METOPROLOL (LOPRESSOR) 50 MG TABLET    Take 1 tablet (50 mg total) by mouth 2 (two) times daily.    PANTOPRAZOLE (PROTONIX) 40 MG TABLET    Take 40 mg by mouth daily.   POTASSIUM CHLORIDE SA (K-DUR,KLOR-CON) 20 MEQ TABLET    Take 1 tablet (20 mEq total) by mouth daily.   SPIRONOLACTONE (ALDACTONE) 25 MG TABLET    Take 25 mg by mouth daily.  Modified Medications   Modified Medication Previous Medication   AMOXICILLIN-CLAVULANATE (AUGMENTIN) 500-125 MG PER TABLET amoxicillin-clavulanate (AUGMENTIN) 500-125 MG per tablet      Take 1 tablet by mouth 3 (three) times daily.    Take  1 tablet (500 mg total) by mouth 3 (three) times daily.  Discontinued Medications   No medications on file     Physical Exam: Filed Vitals:   08/30/14 1157  BP: 120/70  Pulse: 65  Temp: 97.7 F (36.5 C)  Resp: 20  Height: 5\' 4"  (1.626 m)  Weight: 105 lb (47.628 kg)  Physical Exam  Constitutional: She is oriented to person, place, and time.  Cardiovascular: Intact distal pulses.   irreg irreg  Pulmonary/Chest: Effort normal. She has rales.  Bibasilar;  Wearing her O2, breathing comfortably  Abdominal: Soft. Bowel sounds are normal. She exhibits no distension and no mass. There is no tenderness.  Musculoskeletal: Normal range of motion. She exhibits no edema and no tenderness.  Neurological: She is alert and oriented to person, place, and time.  Skin: Skin is warm and dry.    Labs reviewed: Basic Metabolic Panel:  Recent Labs  03/16/14 0400  08/23/14 0448 08/24/14 0407 08/25/14 0340  NA 140  < > 150* 144 142  K 4.9  < > 4.3 3.4* 3.6*  CL 109  < > 108 100 99  CO2 18*  < > 23 29 32  GLUCOSE 137*  < > 82 81 83  BUN 55*  < > 37* 33* 26*  CREATININE 1.73*  < > 1.88* 1.74* 1.51*  CALCIUM 8.5  < > 9.2 8.7 8.6  MG 1.8  --  1.9  --   --   < > = values in this interval not displayed. Liver Function Tests:  Recent Labs  08/23/14 0448 08/24/14 0407 08/25/14 0340  AST 21 18 15   ALT 29 23 18   ALKPHOS 160* 141* 122*  BILITOT 0.8 1.0 1.1  PROT 5.8* 5.5* 5.4*  ALBUMIN 3.2* 3.1* 3.0*   No results  found for this basename: LIPASE, AMYLASE,  in the last 8760 hours No results found for this basename: AMMONIA,  in the last 8760 hours CBC:  Recent Labs  05/23/14 1605  06/24/14 1113  08/21/14 1655 08/23/14 1140 08/24/14 0407 08/25/14 0340  WBC 6.0  < > 16.3*  < > 7.9 7.6 6.9 6.7  NEUTROABS 5.4  --  15.6*  --  6.4  --   --   --   HGB 11.8*  < > 11.1*  < > 10.6* 10.1* 10.1* 10.5*  HCT 35.9*  < > 33.2*  < > 33.3* 31.4* 30.8* 32.5*  MCV 98.4  < > 96.5  < > 87.2 85.8 83.5 84.4  PLT 190  < > 226  < > 281 265 287 272  < > = values in this interval not displayed. Cardiac Enzymes:  Recent Labs  03/09/14 1414 03/15/14 1228 08/21/14 1655  TROPONINI <0.30 <0.30 <0.30  CBG:  Recent Labs  08/25/14 0752 08/25/14 1201 08/25/14 1743  GLUCAP 84 128* 147*    Imaging and Procedures: 08/21/14:  CXR:  Persistent left worse than right effusions and airspace disease. Aeration in the right lung base appears slightly improved. No new abnormality.  Cardiomegaly.  08/22/14:  Limited abdominal US:  1. Cholelithiasis with marked gallbladder wall thickening, measuring  up to 16 mm. Although there is no sonographic Murphy sign, findings are concerning for cholecystitis.  2. Echogenic material within the common bile duct is concerning for  a nonobstructing stone. The duct measures 4 mm. 3.  Small right pleural effusion present.  08/22/14:  HIDA scan:  1. Nonvisualized gallbladder consistent with obstruction of the  cystic duct. This supports  the diagnosis of acute cholecystitis.  08/24/14:  CXR:  Stable bilateral pleural effusions with left greater than right. Stable right basilar opacity is noted concerning for possible  pneumonia. Stable left basilar opacity is noted concerning for pneumonia or atelectasis.   Assessment/Plan 1. Chronic combined systolic and diastolic CHF (congestive heart failure) -fluid restriction, NAS diet, cont diuretic regimen and monitor daily weights -does have rales  at bases today and continues to require O2 -f/u bmp and f/u with cardiology as planned 2. Paroxysmal atrial fibrillation -currently rate is controlled -is on baby asa -has pacemaker -metoprolol dose was reduced to 50mg  po bid during hospitlaization 3. Cholecystitis, acute -chose only abx for intervention -will monitor--seems her cardiac state is too rocky at this time to have any kind of surgery and she does not want it -is asymptomatic at present 4. Gastroesophageal reflux disease without esophagitis -stable with protonix 5. CKD (chronic kidney disease), stage III -cont losartan, avoid nsaids, monitor bmp 6. Pacemaker -in place 7. Malnutrition of moderate degree -will be followed by dietitian, supplements provided 8. Anemia of chronic disease -stable h/h, f/u cbc 9. DM (diabetes mellitus) type II controlled with renal manifestation Lab Results  Component Value Date   HGBA1C 6.2* 05/23/2014  -has been controlled, is on arb, asa, is not on statin (? Age, side effects?), cont concho nas diet only, not on medications or insulin at present  Functional status:  Requiring some adl assistance with bathing, dressing, transfers due to weakness  Family/ staff Communication: seen with unit supervisor  Labs/tests ordered:  Cbc, bmp next draw

## 2014-09-10 ENCOUNTER — Other Ambulatory Visit: Payer: Self-pay | Admitting: Adult Health

## 2014-09-12 LAB — BASIC METABOLIC PANEL
BUN: 65 mg/dL — AB (ref 4–21)
CREATININE: 2.4 mg/dL — AB (ref 0.5–1.1)
GLUCOSE: 76 mg/dL
Potassium: 5.3 mmol/L (ref 3.4–5.3)
Sodium: 136 mmol/L — AB (ref 137–147)

## 2014-09-13 ENCOUNTER — Non-Acute Institutional Stay (SKILLED_NURSING_FACILITY): Payer: Medicare Other | Admitting: Internal Medicine

## 2014-09-13 ENCOUNTER — Encounter: Payer: Self-pay | Admitting: Internal Medicine

## 2014-09-13 DIAGNOSIS — G8929 Other chronic pain: Secondary | ICD-10-CM

## 2014-09-13 DIAGNOSIS — M545 Low back pain: Secondary | ICD-10-CM

## 2014-09-13 DIAGNOSIS — M25512 Pain in left shoulder: Secondary | ICD-10-CM

## 2014-09-13 DIAGNOSIS — R0902 Hypoxemia: Secondary | ICD-10-CM

## 2014-09-13 DIAGNOSIS — M25511 Pain in right shoulder: Secondary | ICD-10-CM

## 2014-09-13 NOTE — Progress Notes (Signed)
Patient ID: Sophia Oliver, female   DOB: [birth year], 80 y.o.   MRN: [id number]    Place of Service: [facility]  Allergies  Allergen Reactions  . Codeine Nausea And Vomiting  . Hydrocodone-Acetaminophen Itching  . Other Nausea And Vomiting    Narcotics  dizziness    Code Status: Full Code  Goals of Care: Longevity/Short term Rehab  Chief Complaint  Patient presents with  . Acute Visit    shoulder pain     HPI  80 y.o. female with PMH of CHF, PAF, sinus node dysfunction s/p pacemaker, anxiety, chronic lower back pain among others is being seen for an acute visit for bil shoulder pain. Patient reported having chronic pain in both shoulders and lower back. Tylenol is helping a little but she usually uses Voltaren gel fours times daily at home. Otherwise, no complaints verbalized. She was recently given IV fluid secondary to elevated BUN and creatinine.  Per therapy, patient also desats easily with minimal exertion and has to be on supplemental oxygen. No additional concerns from staff.   Review of Systems Constitutional: Negative for fever, chills, and fatigue. Cardiovascular: Negative for chest pain, palpitations, and leg swelling Respiratory: Negative cough, shortness of breath, and wheezing.  Gastrointestinal: Negative for nausea and vomiting.  Musculoskeletal: Positive for lower back pain and bil shoulder pain Neurological: Negative for dizziness, headache, weakness, and tremors.  Skin: Negative for rash and wound.   Psychiatric: Negative for depression.   Past Medical History  Diagnosis Date  . Syncope   . Sinus node dysfunction     a. s/p pacemaker.  . Wide-complex tachycardia   . Cholelithiasis   . Glucose intolerance (impaired glucose tolerance)   . Anxiety   . PONV (postoperative nausea and vomiting)   . Chronic kidney disease     a. Baseline Cr reported 1.8-2.0.  . Injury of right upper arm     after venipuncture in 3/13 - pt describes pain  afterwards- no followup done seen by PCP- no further treatment   . PAF (paroxysmal atrial fibrillation)   . Left bundle branch block   . Tachycardia-bradycardia syndrome   . CHF (congestive heart failure)     a. EF 30-35% by echo 2015.  [name] Pacemaker     Guidant, implanted 2006  . Type II diabetes mellitus     "recently dx'd" (03/09/2014)  . Breast cancer 2003    "radiation on right; mastectomy on the left"  . Hypertension   . GERD (gastroesophageal reflux disease)   . DJD (degenerative joint disease)   . Arthritis     "real bad in my lower back" (03/09/2014)  . Chronic lower back pain   . Orthostatic hypotension     a. Diuretics d/c'd 03/2014.  [name] PAT (paroxysmal atrial tachycardia)     Past Surgical History  Procedure Laterality Date  . Replacement total knee bilateral Bilateral   . Total shoulder replacement    . Tonsillectomy    . Orif distal femur fracture Right 12/2007  . Tonsillectomy    . Mastectomy Left 2003  . Total hip arthroplasty  06/16/2012    Procedure: TOTAL HIP ARTHROPLASTY;  Surgeon: Mauri Pole, MD;  Location: [facility];  Service: Orthopedics;  Laterality: Left;  . Hip closed reduction  06/30/2012    Procedure: CLOSED MANIPULATION HIP;  Surgeon: Mauri Pole, MD;  Location: [facility];  Service: Orthopedics;  Laterality: Left;  . Abdominal hysterectomy    . Pacemaker insertion  Guidant Insignia I  . Cataract extraction w/ intraocular lens  implant, bilateral Bilateral 2000's  . Refractive surgery Bilateral 01/2014  . Inguinal hernia repair Left     "had emergency OR for it a few years ago" (03/09/2014)  . Joint replacement    . Breast biopsy Bilateral     History   Social History  . Marital Status: Married    Spouse Name: N/A    Number of Children: N/A  . Years of Education: N/A   Occupational History  . Not on file.   Social History Main Topics  . Smoking status: Former Smoker -- 0.50 packs/day for 6 years    Types: Cigarettes  . Smokeless tobacco:  Never Used  . Alcohol Use: Yes     Comment: "I'll have a social drink maybe at [employer]  . Drug Use: No  . Sexual Activity: No   Other Topics Concern  . Not on file   Social History Narrative   Married, retired.       Medication List       This list is accurate as of: 09/13/14  3:55 PM.  Always use your most recent med list.               acetaminophen 500 MG tablet  Commonly known as:  TYLENOL  Take 500 mg by mouth every 6 (six) hours as needed (joint pain).     ALPRAZolam 0.25 MG tablet  Commonly known as:  XANAX  Take one tablet by mouth twice daily for anxiety     aspirin 81 MG chewable tablet  Chew 1 tablet (81 mg total) by mouth daily.     diclofenac sodium 1 % Gel  Commonly known as:  VOLTAREN  Apply 2 g topically 4 (four) times daily as needed (joint pain).     DSS 100 MG Caps  Take 100 mg by mouth 2 (two) times daily.     feeding supplement (GLUCERNA SHAKE) Liqd  Take 237 mLs by mouth 3 (three) times daily between meals.     furosemide 40 MG tablet  Commonly known as:  LASIX  Take 1 tablet (40 mg total) by mouth daily.     losartan 25 MG tablet  Commonly known as:  COZAAR  Take 1 tablet (25 mg total) by mouth daily.     magic mouthwash Soln  Take 10 mLs by mouth 3 (three) times daily as needed for mouth pain (mouth pain).     metoprolol 50 MG tablet  Commonly known as:  LOPRESSOR  Take 1 tablet (50 mg total) by mouth 2 (two) times daily.     pantoprazole 40 MG tablet  Commonly known as:  PROTONIX  Take 40 mg by mouth daily.     potassium chloride SA 20 MEQ tablet  Commonly known as:  K-DUR,KLOR-CON  Take 1 tablet (20 mEq total) by mouth daily.     spironolactone 25 MG tablet  Commonly known as:  ALDACTONE  Take 25 mg by mouth daily.        Physical Exam Filed Vitals:   09/13/14 1545  BP: 115/62  Pulse: 74  Temp: 97 F (36.1 C)  Resp: 20   Constitutional: thin elderly female in no acute distress. Conversant and pleasant     Neck: No JVD or carotid bruits. Cardiac: Normal S1, S2. RRR without appreciable murmurs, rubs, or gallops. Distal pulses intact. No dependent edema.  Lungs: No respiratory distress. Breath sounds diminished. Poor air movement bilaterally.  On 2L of oxygen via [place]. Abdomen: Audible bowel sounds in all quadrants. Soft, nontender, nondistended. No palpable mass.  Musculoskeletal: Limited ROM and diminished strength. R shoulder joint tender to palpation, pain with active ROM. Back is tender to palpation.  Skin: Warm and dry. No rash noted. No erythema.  Neurological: Alert and oriented to person, place, and time.  Psychiatric: Judgment and insight adequate. Appropriate mood and affect.   Labs Reviewed CBC Latest Ref Rng 08/25/2014 08/24/2014 08/23/2014  WBC 4.0 - 10.5 K/uL 6.7 6.9 7.6  Hemoglobin 12.0 - 15.0 g/dL 10.5(L) 10.1(L) 10.1(L)  Hematocrit 36.0 - 46.0 % 32.5(L) 30.8(L) 31.4(L)  Platelets 150 - 400 K/uL 272 287 265    CMP     Component Value Date/Time   NA 136* 09/12/2014   NA 142 08/25/2014 0340   K 5.3 09/12/2014   CL 99 08/25/2014 0340   CO2 32 08/25/2014 0340   GLUCOSE 83 08/25/2014 0340   BUN 65* 09/12/2014   BUN 26* 08/25/2014 0340   CREATININE 2.4* 09/12/2014   CREATININE 1.51* 08/25/2014 0340   CALCIUM 8.6 08/25/2014 0340   PROT 5.4* 08/25/2014 0340   ALBUMIN 3.0* 08/25/2014 0340   AST 15 08/25/2014 0340   ALT 18 08/25/2014 0340   ALKPHOS 122* 08/25/2014 0340   BILITOT 1.1 08/25/2014 0340   GFRNONAA 30* 08/25/2014 0340   GFRAA 35* 08/25/2014 0340   Lab Results  Component Value Date   HGBA1C 6.2* 05/23/2014    Assessment & Plan  1. Shoulder pain, bilateral Change Voltaren gel 1% from topically QID PRN to QID scheduled. Continue tylenol 500mg  Q6H PRN for pain. Consider to monitor.  2. Chronic lower back pain Change Voltaren gel 1% from topically QID PRN to QID scheduled. Continue tylenol 500mg  Q6H PRN for pain. Continue to monitor  3. Oxygen  desaturation Encourage incentive spirometry. Patient denies using supplemental oxygen at home. Will obtain O2 sat at rest, with activities, and during sleep with and without oxygen. Continue to monitor.   Labs Ordered: Repeat BMP if not done so already after IV fluid   Family/Staff Communication Plan of care discuss with resident and professional staff members. Resident and professional staff members verbalize understanding and agree with plan of care. No additional questions or concerns reported.    Arthur Holms, MSN, AGNP-C [facility] [address] [place], [place] [address] [phone] [8am-5pm] After hours: [phone]

## 2014-10-04 ENCOUNTER — Non-Acute Institutional Stay (SKILLED_NURSING_FACILITY): Payer: Medicare Other | Admitting: Internal Medicine

## 2014-10-04 ENCOUNTER — Encounter: Payer: Self-pay | Admitting: Internal Medicine

## 2014-10-04 DIAGNOSIS — N183 Chronic kidney disease, stage 3 unspecified: Secondary | ICD-10-CM

## 2014-10-04 DIAGNOSIS — E440 Moderate protein-calorie malnutrition: Secondary | ICD-10-CM

## 2014-10-04 DIAGNOSIS — M25512 Pain in left shoulder: Secondary | ICD-10-CM

## 2014-10-04 DIAGNOSIS — M25511 Pain in right shoulder: Secondary | ICD-10-CM

## 2014-10-04 DIAGNOSIS — I480 Paroxysmal atrial fibrillation: Secondary | ICD-10-CM

## 2014-10-04 DIAGNOSIS — I5042 Chronic combined systolic (congestive) and diastolic (congestive) heart failure: Secondary | ICD-10-CM

## 2014-10-04 DIAGNOSIS — E1129 Type 2 diabetes mellitus with other diabetic kidney complication: Secondary | ICD-10-CM

## 2014-10-04 DIAGNOSIS — N058 Unspecified nephritic syndrome with other morphologic changes: Secondary | ICD-10-CM

## 2014-10-04 NOTE — Progress Notes (Signed)
Patient ID: Sophia Oliver, female   DOB: Jan 27, 1924, 80 y.o.   MRN: [id number]  Location: [facility] Provider:  Rexene Edison. Mariea Clonts, D.O., C.M.D.  Code Status:  DNR  Chief Complaint  Patient presents with  . Acute Visit    right shoulder pain, requests referral to Dr. Veverly Fells for cortisone shot  . Medical Management of Chronic Issues    HPI:  80 yo white female here for rehab after a complicated hospitalization with pneumonia and chf exacerbation was seen for acute visit due to right shoulder pain and also a sore area on her left ear.  She notes that she's had redness and pain in both earlobes recently for more than 2 mos--improves with use of antibiotic ointments.    Has been getting bilateral cortisone injections in shoulders due to severe pain--gets relief from these.  Tylenol and voltaren are not doing the trick.  Review of Systems:  Review of Systems  Constitutional: Positive for weight loss and malaise/fatigue. Negative for fever.  HENT: Negative for congestion.   Eyes: Negative for blurred vision.  Respiratory: Positive for shortness of breath.   Cardiovascular: Negative for chest pain, palpitations and leg swelling.  Gastrointestinal: Negative for abdominal pain, constipation, blood in stool and melena.  Genitourinary: Negative for dysuria.  Musculoskeletal: Positive for myalgias and joint pain. Negative for falls.  Skin: Negative for rash.  Neurological: Positive for weakness. Negative for dizziness.  Endo/Heme/Allergies: Bruises/bleeds easily.  Psychiatric/Behavioral: The patient is nervous/anxious.     Medications: Patient's Medications  New Prescriptions   No medications on file  Previous Medications   ACETAMINOPHEN (TYLENOL) 500 MG TABLET    Take 500 mg by mouth every 6 (six) hours as needed (joint pain).   ALPRAZOLAM (XANAX) 0.25 MG TABLET    Take one tablet by mouth twice daily for anxiety   ALUM & MAG HYDROXIDE-SIMETH (MAGIC MOUTHWASH) SOLN    Take  10 mLs by mouth 3 (three) times daily as needed for mouth pain (mouth pain).   DICLOFENAC SODIUM (VOLTAREN) 1 % GEL    Apply 2 g topically 4 (four) times daily as needed (joint pain).   DOCUSATE SODIUM 100 MG CAPS    Take 100 mg by mouth 2 (two) times daily.   FEEDING SUPPLEMENT, GLUCERNA SHAKE, (GLUCERNA SHAKE) LIQD    Take 237 mLs by mouth 3 (three) times daily between meals.   FUROSEMIDE (LASIX) 40 MG TABLET    Take 1 tablet (40 mg total) by mouth daily.   LOSARTAN (COZAAR) 25 MG TABLET    Take 1 tablet (25 mg total) by mouth daily.   METOPROLOL (LOPRESSOR) 50 MG TABLET    Take 1 tablet (50 mg total) by mouth 2 (two) times daily.   PANTOPRAZOLE (PROTONIX) 40 MG TABLET    Take 40 mg by mouth daily.   SPIRONOLACTONE (ALDACTONE) 25 MG TABLET    Take 25 mg by mouth daily.  Modified Medications   No medications on file  Discontinued Medications   ASPIRIN 81 MG CHEWABLE TABLET    Chew 1 tablet (81 mg total) by mouth daily.   POTASSIUM CHLORIDE SA (K-DUR,KLOR-CON) 20 MEQ TABLET    Take 1 tablet (20 mEq total) by mouth daily.    Physical Exam: Filed Vitals:   10/04/14 1103  BP: 120/50  Pulse: 65  Temp: 96.8 F (36 C)  Resp: 18  Height: 5\' 3"  (1.6 m)  Weight: 99 lb 3.2 oz (44.997 kg)  SpO2: 90%  Physical  Exam  Constitutional: She is oriented to person, place, and time.  Cardiovascular: Normal rate, regular rhythm, normal heart sounds and intact distal pulses.   Pulmonary/Chest: Effort normal and breath sounds normal. She has no wheezes. She has no rales.  Wearing her O2  Abdominal: Soft. Bowel sounds are normal.  Musculoskeletal: She exhibits tenderness.  Bilateral rotator cuffs, ROM poor --cannot even reach 90 abduction  Neurological: She is alert and oriented to person, place, and time.  Skin: Skin is warm and dry. There is pallor.  Left ear with excoriated, erythematous area on lateral aspect (suspect pressure from oxygen tubing)  Psychiatric:  Flat affect     Labs  reviewed: Basic Metabolic Panel:  Recent Labs  03/16/14 0400  08/23/14 0448 08/24/14 0407 08/25/14 0340 09/12/14  NA 140  < > 150* 144 142 136*  K 4.9  < > 4.3 3.4* 3.6* 5.3  CL 109  < > 108 100 99  --   CO2 18*  < > 23 29 32  --   GLUCOSE 137*  < > 82 81 83  --   BUN 55*  < > 37* 33* 26* 65*  CREATININE 1.73*  < > 1.88* 1.74* 1.51* 2.4*  CALCIUM 8.5  < > 9.2 8.7 8.6  --   MG 1.8  --  1.9  --   --   --   < > = values in this interval not displayed.  Liver Function Tests:  Recent Labs  08/23/14 0448 08/24/14 0407 08/25/14 0340  AST 21 18 15   ALT 29 23 18   ALKPHOS 160* 141* 122*  BILITOT 0.8 1.0 1.1  PROT 5.8* 5.5* 5.4*  ALBUMIN 3.2* 3.1* 3.0*    CBC:  Recent Labs  05/23/14 1605  06/24/14 1113  08/21/14 1655 08/23/14 1140 08/24/14 0407 08/25/14 0340  WBC 6.0  < > 16.3*  < > 7.9 7.6 6.9 6.7  NEUTROABS 5.4  --  15.6*  --  6.4  --   --   --   HGB 11.8*  < > 11.1*  < > 10.6* 10.1* 10.1* 10.5*  HCT 35.9*  < > 33.2*  < > 33.3* 31.4* 30.8* 32.5*  MCV 98.4  < > 96.5  < > 87.2 85.8 83.5 84.4  PLT 190  < > 226  < > 281 265 287 272  < > = values in this interval not displayed. Lab Results  Component Value Date   HGBA1C 6.2* 05/23/2014     Assessment/Plan 1. Shoulder pain, bilateral -h/o right shoulder replacement then fall on left shoulder with pain since then -had been getting every 2 mos cortisone injections per Dr. Veverly Fells and requests to go back b/c she's missed one and is having much worsened pain in right greater than left shoulders -referral written -using tylenol and voltaren gel for pain now  2. Chronic combined systolic and diastolic CHF (congestive heart failure) -cont spironolactone, cozaar, lasix, lopressor -well controlled at present -cont diet, fluid restriction, and weights -f/u bmp as she was taken off potassium due to use of spironolactone at her last visit  3. Paroxysmal atrial fibrillation -cont lopressor for rate control, not on  anticoagulation--refused asa due to bruising becoming excessive  4. CKD (chronic kidney disease), stage III -f/u bmp due to diuretic and ARB use  5. DM (diabetes mellitus) type II controlled with renal manifestation -diet controlled -last hba1c in July so ordered  6. Malnutrition of moderate degree -due to her recent pneumonia, chf exacerbation,  cont facility supplements  Family/ staff Communication: seen with unit supervisor  Goals of care: here for rehab with goal to return home, but her son apparently favors long term care possibly at [facility] setting--her husband is independent  Labs/tests ordered:  Bmp, hba1c in 1 wk

## 2014-10-05 ENCOUNTER — Encounter: Payer: Self-pay | Admitting: Internal Medicine

## 2014-10-07 ENCOUNTER — Non-Acute Institutional Stay (SKILLED_NURSING_FACILITY): Payer: Medicare Other | Admitting: Adult Health

## 2014-10-07 DIAGNOSIS — N179 Acute kidney failure, unspecified: Secondary | ICD-10-CM

## 2014-10-07 DIAGNOSIS — E875 Hyperkalemia: Secondary | ICD-10-CM

## 2014-10-07 DIAGNOSIS — N183 Chronic kidney disease, stage 3 unspecified: Secondary | ICD-10-CM

## 2014-10-07 DIAGNOSIS — N390 Urinary tract infection, site not specified: Secondary | ICD-10-CM

## 2014-10-12 ENCOUNTER — Non-Acute Institutional Stay (SKILLED_NURSING_FACILITY): Payer: Medicare Other | Admitting: Adult Health

## 2014-10-12 ENCOUNTER — Encounter: Payer: Self-pay | Admitting: Adult Health

## 2014-10-12 DIAGNOSIS — E875 Hyperkalemia: Secondary | ICD-10-CM | POA: Insufficient documentation

## 2014-10-12 DIAGNOSIS — N179 Acute kidney failure, unspecified: Secondary | ICD-10-CM | POA: Insufficient documentation

## 2014-10-12 DIAGNOSIS — N390 Urinary tract infection, site not specified: Secondary | ICD-10-CM | POA: Insufficient documentation

## 2014-10-12 NOTE — Progress Notes (Signed)
Patient ID: Sophia Oliver, female   DOB: July 07, 1924, 80 y.o.   MRN: [id number]  [name] living [place]     Allergies  Allergen Reactions  . Codeine Nausea And Vomiting  . Hydrocodone-Acetaminophen Itching  . Other Nausea And Vomiting    Narcotics  dizziness       Chief Complaint  Patient presents with  . Acute Visit    staff concerns    HPI:  Staff reports that she is less able to participate in therapy has decreased 02 sats with activity. She is more anxious has increased shortness of breath. Did run a low grade temp yesterday. Her chest x-ray does not show infection. There is concern that she is developing an uti.    Past Medical History  Diagnosis Date  . Syncope   . Sinus node dysfunction     a. s/p pacemaker.  . Wide-complex tachycardia   . Cholelithiasis   . Glucose intolerance (impaired glucose tolerance)   . Anxiety   . PONV (postoperative nausea and vomiting)   . Chronic kidney disease     a. Baseline Cr reported 1.8-2.0.  . Injury of right upper arm     after venipuncture in 3/13 - pt describes pain afterwards- no followup done seen by PCP- no further treatment   . PAF (paroxysmal atrial fibrillation)   . Left bundle branch block   . Tachycardia-bradycardia syndrome   . CHF (congestive heart failure)     a. EF 30-35% by echo 2015.  [name] Pacemaker     Guidant, implanted 2006  . Type II diabetes mellitus     "recently dx'd" (03/09/2014)  . Breast cancer 2003    "radiation on right; mastectomy on the left"  . Hypertension   . GERD (gastroesophageal reflux disease)   . DJD (degenerative joint disease)   . Arthritis     "real bad in my lower back" (03/09/2014)  . Chronic lower back pain   . Orthostatic hypotension     a. Diuretics d/c'd 03/2014.  [name] PAT (paroxysmal atrial tachycardia)     Past Surgical History  Procedure Laterality Date  . Replacement total knee bilateral Bilateral   . Total shoulder replacement    . Tonsillectomy    . Orif distal  femur fracture Right 12/2007  . Tonsillectomy    . Mastectomy Left 2003  . Total hip arthroplasty  06/16/2012    Procedure: TOTAL HIP ARTHROPLASTY;  Surgeon: Mauri Pole, MD;  Location: [facility];  Service: Orthopedics;  Laterality: Left;  . Hip closed reduction  06/30/2012    Procedure: CLOSED MANIPULATION HIP;  Surgeon: Mauri Pole, MD;  Location: [facility];  Service: Orthopedics;  Laterality: Left;  . Abdominal hysterectomy    . Pacemaker insertion      Guidant Insignia I  . Cataract extraction w/ intraocular lens  implant, bilateral Bilateral 2000's  . Refractive surgery Bilateral 01/2014  . Inguinal hernia repair Left     "had emergency OR for it a few years ago" (03/09/2014)  . Joint replacement    . Breast biopsy Bilateral     VITAL SIGNS BP 107/56 mmHg  Pulse 57  Ht 5\' 4"  (1.626 m)  Wt 98 lb (44.453 kg)  BMI 16.81 kg/m2  SpO2 83%   Outpatient Encounter Prescriptions as of 10/07/2014  Medication Sig  . acetaminophen (TYLENOL) 500 MG tablet Take 500 mg by mouth every 6 (six) hours as needed (joint pain).  [name] ALPRAZolam (XANAX) 0.25 MG tablet Take  one tablet by mouth twice daily for anxiety  . Alum & Mag Hydroxide-Simeth (MAGIC MOUTHWASH) SOLN Take 10 mLs by mouth 3 (three) times daily as needed for mouth pain (mouth pain).  [name] diclofenac sodium (VOLTAREN) 1 % GEL Apply 2 g topically 4 (four) times daily as needed (joint pain).  [name] docusate sodium 100 MG CAPS Take 100 mg by mouth 2 (two) times daily.  . feeding supplement, GLUCERNA SHAKE, (GLUCERNA SHAKE) LIQD Take 237 mLs by mouth 3 (three) times daily between meals.  . furosemide (LASIX) 40 MG tablet Take 1 tablet (40 mg total) by mouth daily.  [name] losartan (COZAAR) 25 MG tablet Take 1 tablet (25 mg total) by mouth daily.  . metoprolol (LOPRESSOR) 50 MG tablet Take 1 tablet (50 mg total) by mouth 2 (two) times daily.  . pantoprazole (PROTONIX) 40 MG tablet Take 40 mg by mouth daily.  [name] spironolactone (ALDACTONE) 25 MG tablet Take 25  mg by mouth daily.     SIGNIFICANT DIAGNOSTIC EXAMS  10-07-14: chest x-ray: no acute cardiopulmonary process; chronic interstitial lung changes.    LABS REVIEWED:   09-01-14: glucose 141; bun 41; creat 1.79; k+5.6; na++135 09-05-14: glucose 81; bun 51; creat 1.87; k+4.7; na++135 09-12-14: glucose 76; bun 65; creat 2.41; k+5.3; na++136 10-07-14: wbc 8.8; hgb 11.2; hct 34.4; mcv 83.1; plt 359; glucose 153; bun 90; creat 2.89; k+6.8; na++136    Review of Systems  Constitutional: Positive for malaise/fatigue.  Respiratory: Positive for shortness of breath. Negative for cough.   Cardiovascular: Negative for chest pain, palpitations and leg swelling.  Gastrointestinal: Negative for heartburn, abdominal pain and constipation.  Genitourinary: Negative for dysuria.  Musculoskeletal: Positive for joint pain. Negative for myalgias.       Chronic shoulder pain   Skin: Negative.   Neurological: Positive for weakness.  Psychiatric/Behavioral: Negative for depression. The patient is nervous/anxious.      Physical Exam  Constitutional: No distress.  cathectic   Neck: Neck supple. No JVD present.  Cardiovascular: Normal rate, regular rhythm and intact distal pulses.   Respiratory: Effort normal and breath sounds normal. No respiratory distress.  GI: Soft. She exhibits no distension.  Musculoskeletal: She exhibits no edema.  Is able to move all extremities   Neurological: She is alert.  Skin: Skin is warm and dry. She is not diaphoretic.  Psychiatric:  Is anxious        ASSESSMENT/ PLAN:  1. uti 2. hpyerkalemia 3. Acute on chronic renal failure Will give kayexolate 60 gm now; will then being NS at 75 cc per hour for 3 liters; will check k+ in am; and will check bmp on Monday; will check ekg today will begin septra ds twice daily for 10 days and will monitor her status.     Ok Edwards NP [facility]  Contact [phone] Monday through Friday 8am- 5pm  After  hours call [phone]

## 2014-10-18 ENCOUNTER — Non-Acute Institutional Stay (SKILLED_NURSING_FACILITY): Payer: Medicare Other | Admitting: Adult Health

## 2014-10-18 ENCOUNTER — Other Ambulatory Visit: Payer: Self-pay | Admitting: *Deleted

## 2014-10-18 DIAGNOSIS — N179 Acute kidney failure, unspecified: Secondary | ICD-10-CM

## 2014-10-18 DIAGNOSIS — N183 Chronic kidney disease, stage 3 unspecified: Secondary | ICD-10-CM

## 2014-10-18 DIAGNOSIS — I5042 Chronic combined systolic (congestive) and diastolic (congestive) heart failure: Secondary | ICD-10-CM

## 2014-10-18 MED ORDER — ALPRAZOLAM 0.25 MG PO TABS: ORAL_TABLET | ORAL | Status: AC

## 2014-10-18 NOTE — Telephone Encounter (Signed)
[facility] 

## 2014-10-19 ENCOUNTER — Encounter: Payer: Self-pay | Admitting: Adult Health

## 2014-10-19 NOTE — Progress Notes (Signed)
Patient ID: Sophia Oliver, female   DOB: 08/23/1924, 80 y.o.   MRN: [id number]  [name] living     Allergies  Allergen Reactions  . Codeine Nausea And Vomiting  . Hydrocodone-Acetaminophen Itching  . Other Nausea And Vomiting    Narcotics  dizziness       Chief Complaint  Patient presents with  . Acute Visit    follow up lab work     HPI:  Her k+ level did improve; however; her renal function has gotten worse; with a bun of 109 and creat of 2.89.  Her fluid intake is poor. Her weight has remained stable. She remains dependent on 02 due to her heart failure. We will be having a family care plan meeting in the next couple of days in order to better discuss   Past Medical History  Diagnosis Date  . Syncope   . Sinus node dysfunction     a. s/p pacemaker.  . Wide-complex tachycardia   . Cholelithiasis   . Glucose intolerance (impaired glucose tolerance)   . Anxiety   . PONV (postoperative nausea and vomiting)   . Chronic kidney disease     a. Baseline Cr reported 1.8-2.0.  . Injury of right upper arm     after venipuncture in 3/13 - pt describes pain afterwards- no followup done seen by PCP- no further treatment   . PAF (paroxysmal atrial fibrillation)   . Left bundle branch block   . Tachycardia-bradycardia syndrome   . CHF (congestive heart failure)     a. EF 30-35% by echo 2015.  [name] Pacemaker     Guidant, implanted 2006  . Type II diabetes mellitus     "recently dx'd" (03/09/2014)  . Breast cancer 2003    "radiation on right; mastectomy on the left"  . Hypertension   . GERD (gastroesophageal reflux disease)   . DJD (degenerative joint disease)   . Arthritis     "real bad in my lower back" (03/09/2014)  . Chronic lower back pain   . Orthostatic hypotension     a. Diuretics d/c'd 03/2014.  [name] PAT (paroxysmal atrial tachycardia)     Past Surgical History  Procedure Laterality Date  . Replacement total knee bilateral Bilateral   . Total shoulder replacement    .  Tonsillectomy    . Orif distal femur fracture Right 12/2007  . Tonsillectomy    . Mastectomy Left 2003  . Total hip arthroplasty  06/16/2012    Procedure: TOTAL HIP ARTHROPLASTY;  Surgeon: Mauri Pole, MD;  Location: [facility];  Service: Orthopedics;  Laterality: Left;  . Hip closed reduction  06/30/2012    Procedure: CLOSED MANIPULATION HIP;  Surgeon: Mauri Pole, MD;  Location: [facility];  Service: Orthopedics;  Laterality: Left;  . Abdominal hysterectomy    . Pacemaker insertion      Guidant Insignia I  . Cataract extraction w/ intraocular lens  implant, bilateral Bilateral 2000's  . Refractive surgery Bilateral 01/2014  . Inguinal hernia repair Left     "had emergency OR for it a few years ago" (03/09/2014)  . Joint replacement    . Breast biopsy Bilateral     VITAL SIGNS BP 124/68 mmHg  Pulse 78  Ht 5\' 4"  (1.626 m)  Wt 96 lb (43.545 kg)  BMI 16.47 kg/m2  SpO2 93%   Outpatient Encounter Prescriptions as of 10/18/2014  Medication Sig  . acetaminophen (TYLENOL) 500 MG tablet Take 500 mg by mouth every 6 (  six) hours as needed (joint pain).  [name] ALPRAZolam (XANAX) 0.25 MG tablet Take one tablet by mouth twice daily for anxiety  . Alum & Mag Hydroxide-Simeth (MAGIC MOUTHWASH) SOLN Take 10 mLs by mouth 3 (three) times daily as needed for mouth pain (mouth pain).  [name] diclofenac sodium (VOLTAREN) 1 % GEL Apply 2 g topically 4 (four) times daily as needed (joint pain).  [name] docusate sodium 100 MG CAPS Take 100 mg by mouth 2 (two) times daily.  . feeding supplement, GLUCERNA SHAKE, (GLUCERNA SHAKE) LIQD Take 237 mLs by mouth 3 (three) times daily between meals.  . furosemide (LASIX) 40 MG tablet Take 1 tablet (40 mg total) by mouth daily.  [name] losartan (COZAAR) 25 MG tablet Take 1 tablet (25 mg total) by mouth daily.  . metoprolol (LOPRESSOR) 50 MG tablet Take 1 tablet (50 mg total) by mouth 2 (two) times daily.  . pantoprazole (PROTONIX) 40 MG tablet Take 40 mg by mouth daily.      SIGNIFICANT DIAGNOSTIC EXAMS   10-07-14: chest x-ray: no acute cardiopulmonary process; chronic interstitial lung changes.    LABS REVIEWED:   09-01-14: glucose 141; bun 41; creat 1.79; k+5.6; na++135 09-05-14: glucose 81; bun 51; creat 1.87; k+4.7; na++135 09-12-14: glucose 76; bun 65; creat 2.41; k+5.3; na++136 10-07-14: wbc 8.8; hgb 11.2; hct 34.4; mcv 83.1; plt 359; glucose 153; bun 90; creat 2.89; k+6.8; na++136 10-09-14: ;urine culture: klebsiella pneumonia: septra; k+ 5.5 10-11-14:glucose 85; bun 64; creat 2.47; k+6.1; na++134  10-17-14: glucose 91; bun 109; creat 2.89; k+5.0; na++131       ROS  onstitutional: Positive for malaise/fatigue.  Respiratory:. Negative for cough.  negative ofr shortness of breath  Cardiovascular: Negative for chest pain, palpitations and leg swelling.  Gastrointestinal: Negative for heartburn, abdominal pain and constipation.  Genitourinary: Negative for dysuria.  Musculoskeletal:  Negative for myalgias.  Skin: Negative.   Neurological: Positive for weakness.  Psychiatric/Behavioral: Negative for depression.      Physical Exam  Constitutional: No distress.  cathectic   Neck: Neck supple. No JVD present.  Cardiovascular: Normal rate, regular rhythm and intact distal pulses.   Respiratory: Effort normal and breath sounds normal. No respiratory distress.  GI: Soft. She exhibits no distension.  Musculoskeletal: She exhibits no edema.  Is able to move all extremities   Neurological: She is alert.  Skin: Skin is warm and dry. She is not diaphoretic.  Psychiatric: not anxious    ASSESSMENT/ PLAN:  1. Renal failure stage III 2. Systolic and diastolic heart failure Will stop the lasix and cozaar due to her worsening renal function; will being NS at 75 cc per hour for 2 liters; will then check bmp; will set up family care plan meeting to discuss her overall status and future health care needs.      Ok Edwards NP [facility]  Contact [phone] Monday through Friday 8am- 5pm  After hours call [phone]

## 2014-10-19 NOTE — Progress Notes (Signed)
Patient ID: Sophia Oliver, female   DOB: January 24, 1924, 80 y.o.   MRN: [id number]  [name]     Allergies  Allergen Reactions  . Codeine Nausea And Vomiting  . Hydrocodone-Acetaminophen Itching  . Other Nausea And Vomiting    Narcotics  dizziness       Chief Complaint  Patient presents with  . Acute Visit    follow up status     HPI:  she is hyperkalemic once again. Her k+ level is 6.1. Her renal is worse as well. Will need to stop the aldactone at this time. Will  Recheck her bmp next week; if her renal function continues to decline; will need to stop other offending medications.   Past Medical History  Diagnosis Date  . Syncope   . Sinus node dysfunction     a. s/p pacemaker.  . Wide-complex tachycardia   . Cholelithiasis   . Glucose intolerance (impaired glucose tolerance)   . Anxiety   . PONV (postoperative nausea and vomiting)   . Chronic kidney disease     a. Baseline Cr reported 1.8-2.0.  . Injury of right upper arm     after venipuncture in 3/13 - pt describes pain afterwards- no followup done seen by PCP- no further treatment   . PAF (paroxysmal atrial fibrillation)   . Left bundle branch block   . Tachycardia-bradycardia syndrome   . CHF (congestive heart failure)     a. EF 30-35% by echo 2015.  [name] Pacemaker     Guidant, implanted 2006  . Type II diabetes mellitus     "recently dx'd" (03/09/2014)  . Breast cancer 2003    "radiation on right; mastectomy on the left"  . Hypertension   . GERD (gastroesophageal reflux disease)   . DJD (degenerative joint disease)   . Arthritis     "real bad in my lower back" (03/09/2014)  . Chronic lower back pain   . Orthostatic hypotension     a. Diuretics d/c'd 03/2014.  [name] PAT (paroxysmal atrial tachycardia)     Past Surgical History  Procedure Laterality Date  . Replacement total knee bilateral Bilateral   . Total shoulder replacement    . Tonsillectomy    . Orif distal femur fracture Right 12/2007  .  Tonsillectomy    . Mastectomy Left 2003  . Total hip arthroplasty  06/16/2012    Procedure: TOTAL HIP ARTHROPLASTY;  Surgeon: Mauri Pole, MD;  Location: [facility];  Service: Orthopedics;  Laterality: Left;  . Hip closed reduction  06/30/2012    Procedure: CLOSED MANIPULATION HIP;  Surgeon: Mauri Pole, MD;  Location: [facility];  Service: Orthopedics;  Laterality: Left;  . Abdominal hysterectomy    . Pacemaker insertion      Guidant Insignia I  . Cataract extraction w/ intraocular lens  implant, bilateral Bilateral 2000's  . Refractive surgery Bilateral 01/2014  . Inguinal hernia repair Left     "had emergency OR for it a few years ago" (03/09/2014)  . Joint replacement    . Breast biopsy Bilateral     VITAL SIGNS BP 140/81 mmHg  Pulse 65  Ht 5\' 4"  (1.626 m)  Wt 97 lb (43.999 kg)  BMI 16.64 kg/m2  SpO2 92%   Outpatient Encounter Prescriptions as of 10/12/2014  Medication Sig  . acetaminophen (TYLENOL) 500 MG tablet Take 500 mg by mouth every 6 (six) hours as needed (joint pain).  [name] ALPRAZolam (XANAX) 0.25 MG tablet Take one tablet by  mouth twice daily for anxiety  . Alum & Mag Hydroxide-Simeth (MAGIC MOUTHWASH) SOLN Take 10 mLs by mouth 3 (three) times daily as needed for mouth pain (mouth pain).  [name] diclofenac sodium (VOLTAREN) 1 % GEL Apply 2 g topically 4 (four) times daily as needed (joint pain).  [name] docusate sodium 100 MG CAPS Take 100 mg by mouth 2 (two) times daily.  . feeding supplement, GLUCERNA SHAKE, (GLUCERNA SHAKE) LIQD Take 237 mLs by mouth 3 (three) times daily between meals.  . furosemide (LASIX) 40 MG tablet Take 1 tablet (40 mg total) by mouth daily.  [name] losartan (COZAAR) 25 MG tablet Take 1 tablet (25 mg total) by mouth daily.  . metoprolol (LOPRESSOR) 50 MG tablet Take 1 tablet (50 mg total) by mouth 2 (two) times daily.  . pantoprazole (PROTONIX) 40 MG tablet Take 40 mg by mouth daily.  [name] spironolactone (ALDACTONE) 25 MG tablet Take 25 mg by mouth daily.      SIGNIFICANT DIAGNOSTIC EXAMS  10-07-14: chest x-ray: no acute cardiopulmonary process; chronic interstitial lung changes.    LABS REVIEWED:   09-01-14: glucose 141; bun 41; creat 1.79; k+5.6; na++135 09-05-14: glucose 81; bun 51; creat 1.87; k+4.7; na++135 09-12-14: glucose 76; bun 65; creat 2.41; k+5.3; na++136 10-07-14: wbc 8.8; hgb 11.2; hct 34.4; mcv 83.1; plt 359; glucose 153; bun 90; creat 2.89; k+6.8; na++136 10-09-14: ;urine culture: klebsiella pneumonia: septra; k+ 5.5 10-11-14:glucose 85; bun 64; creat 2.47; k+6.1; na++134      ROS  Constitutional: Positive for malaise/fatigue.  Respiratory:. Negative for cough.  negative ofr shortness of breath  Cardiovascular: Negative for chest pain, palpitations and leg swelling.  Gastrointestinal: Negative for heartburn, abdominal pain and constipation.  Genitourinary: Negative for dysuria.  Musculoskeletal: Positive for joint pain. Negative for myalgias.       Chronic shoulder pain   Skin: Negative.   Neurological: Positive for weakness.  Psychiatric/Behavioral: Negative for depression.      Physical Exam  Constitutional: No distress.  cathectic   Neck: Neck supple. No JVD present.  Cardiovascular: Normal rate, regular rhythm and intact distal pulses.   Respiratory: Effort normal and breath sounds normal. No respiratory distress.  GI: Soft. She exhibits no distension.  Musculoskeletal: She exhibits no edema.  Is able to move all extremities   Neurological: She is alert.  Skin: Skin is warm and dry. She is not diaphoretic.  Psychiatric: not anxious        ASSESSMENT/ PLAN:  1. Hyperkalemia: will give her kayexalate 30 gm one time now;  Will stop the aldactone due to her elevated k+ levels; will continue daily weights and will check bmp on 10-17-14.    Ok Edwards NP [facility]  Contact [phone] Monday through Friday 8am- 5pm  After hours call [phone]

## 2014-10-20 ENCOUNTER — Non-Acute Institutional Stay (SKILLED_NURSING_FACILITY): Payer: Medicare Other | Admitting: Adult Health

## 2014-10-20 DIAGNOSIS — M25552 Pain in left hip: Secondary | ICD-10-CM

## 2014-10-20 DIAGNOSIS — N183 Chronic kidney disease, stage 3 unspecified: Secondary | ICD-10-CM

## 2014-10-20 DIAGNOSIS — I5042 Chronic combined systolic (congestive) and diastolic (congestive) heart failure: Secondary | ICD-10-CM

## 2014-10-27 ENCOUNTER — Encounter: Payer: Medicare Other | Admitting: Internal Medicine

## 2014-11-04 ENCOUNTER — Encounter: Payer: Self-pay | Admitting: Adult Health

## 2014-11-04 DIAGNOSIS — M25552 Pain in left hip: Secondary | ICD-10-CM | POA: Insufficient documentation

## 2014-11-04 NOTE — Progress Notes (Signed)
Patient ID: Sophia Oliver, female   DOB: 11-08-1923, 81 y.o.   MRN: [id number]  [facility] living     Allergies  Allergen Reactions  . Codeine Nausea And Vomiting  . Hydrocodone-Acetaminophen Itching  . Other Nausea And Vomiting    Narcotics  dizziness       Chief Complaint  Patient presents with  . Acute Visit    family meeeting    HPI:  We have come together with her spouse and son to discuss her medical issues. We discussed her heart failure; her heart function. Her family is aware of her weakened heart. They are not aware of her renal disease. We discussed her poor renal function. I have been removing medications which can alter her renal function. All renally offending medications have been stopped at this time. Her family wants to know what else can be done for her renal function. Will have her been seen in nephrology   Past Medical History  Diagnosis Date  . Syncope   . Sinus node dysfunction     a. s/p pacemaker.  . Wide-complex tachycardia   . Cholelithiasis   . Glucose intolerance (impaired glucose tolerance)   . Anxiety   . PONV (postoperative nausea and vomiting)   . Chronic kidney disease     a. Baseline Cr reported 1.8-2.0.  . Injury of right upper arm     after venipuncture in 3/13 - pt describes pain afterwards- no followup done seen by PCP- no further treatment   . PAF (paroxysmal atrial fibrillation)   . Left bundle branch block   . Tachycardia-bradycardia syndrome   . CHF (congestive heart failure)     a. EF 30-35% by echo 2015.  [name] Pacemaker     Guidant, implanted 2006  . Type II diabetes mellitus     "recently dx'd" (03/09/2014)  . Breast cancer 2003    "radiation on right; mastectomy on the left"  . Hypertension   . GERD (gastroesophageal reflux disease)   . DJD (degenerative joint disease)   . Arthritis     "real bad in my lower back" (03/09/2014)  . Chronic lower back pain   . Orthostatic hypotension     a. Diuretics d/c'd 03/2014.  [name] PAT  (paroxysmal atrial tachycardia)     Past Surgical History  Procedure Laterality Date  . Replacement total knee bilateral Bilateral   . Total shoulder replacement    . Tonsillectomy    . Orif distal femur fracture Right 12/2007  . Tonsillectomy    . Mastectomy Left 2003  . Total hip arthroplasty  06/16/2012    Procedure: TOTAL HIP ARTHROPLASTY;  Surgeon: Mauri Pole, MD;  Location: [facility];  Service: Orthopedics;  Laterality: Left;  . Hip closed reduction  06/30/2012    Procedure: CLOSED MANIPULATION HIP;  Surgeon: Mauri Pole, MD;  Location: [facility];  Service: Orthopedics;  Laterality: Left;  . Abdominal hysterectomy    . Pacemaker insertion      Guidant Insignia I  . Cataract extraction w/ intraocular lens  implant, bilateral Bilateral 2000's  . Refractive surgery Bilateral 01/2014  . Inguinal hernia repair Left     "had emergency OR for it a few years ago" (03/09/2014)  . Joint replacement    . Breast biopsy Bilateral     VITAL SIGNS BP 134/84 mmHg  Pulse 82  Ht 5\' 4"  (1.626 m)  Wt 99 lb (44.906 kg)  BMI 16.98 kg/m2  SpO2 95%   Outpatient Encounter  Prescriptions as of 10/20/2014  Medication Sig  . acetaminophen (TYLENOL) 500 MG tablet Take 500 mg by mouth every 6 (six) hours as needed (joint pain).  [name] ALPRAZolam (XANAX) 0.25 MG tablet Take one tablet by mouth twice daily for anxiety  . Alum & Mag Hydroxide-Simeth (MAGIC MOUTHWASH) SOLN Take 10 mLs by mouth 3 (three) times daily as needed for mouth pain (mouth pain).  [name] diclofenac sodium (VOLTAREN) 1 % GEL Apply 2 g topically 4 (four) times daily as needed (joint pain).  [name] docusate sodium 100 MG CAPS Take 100 mg by mouth 2 (two) times daily.  . feeding supplement, GLUCERNA SHAKE, (GLUCERNA SHAKE) LIQD Take 237 mLs by mouth 3 (three) times daily between meals.  . metoprolol (LOPRESSOR) 50 MG tablet Take 1 tablet (50 mg total) by mouth 2 (two) times daily.  . pantoprazole (PROTONIX) 40 MG tablet Take 40 mg by mouth daily.      SIGNIFICANT DIAGNOSTIC EXAMS  08-22-14: 2-d echo:  Left ventricle: The cavity size was normal. Systolic function was moderately to severely reduced. The estimated ejection fraction was in the range of 30% to 35%. Regional wall motion abnormalities cannot be excluded. Doppler parameters are consistent with abnormal left ventricular relaxation (grade 1 diastolic dysfunction). - Ventricular septum: Septal motion showed paradox. - Aortic valve: There was trivial regurgitation. - Mitral valve: Calcified annulus. There was moderate regurgitation. - Right atrium: The atrium was mildly dilated. - Atrial septum: No defect or patent foramen ovale was identified. - Tricuspid valve: There was severe regurgitation. - Pulmonary arteries: Systolic pressure was severely increased.    10-07-14: chest x-ray: no acute cardiopulmonary process; chronic interstitial lung changes.    LABS REVIEWED:   09-01-14: glucose 141; bun 41; creat 1.79; k+5.6; na++135 09-05-14: glucose 81; bun 51; creat 1.87; k+4.7; na++135 09-12-14: glucose 76; bun 65; creat 2.41; k+5.3; na++136 10-07-14: wbc 8.8; hgb 11.2; hct 34.4; mcv 83.1; plt 359; glucose 153; bun 90; creat 2.89; k+6.8; na++136 10-09-14: ;urine culture: klebsiella pneumonia: septra; k+ 5.5 10-11-14:glucose 85; bun 64; creat 2.47; k+6.1; na++134  10-17-14: glucose 91; bun 109; creat 2.89; k+5.0; na++131 10-20-14: glucose 107; bun 75; creat 2.06; k+4.9; na++136         Review of Systems  Constitutional: Negative for malaise/fatigue.  Respiratory: Negative for cough and shortness of breath.   Cardiovascular: Negative for chest pain, palpitations and leg swelling.  Gastrointestinal: Negative for heartburn, abdominal pain and constipation.  Musculoskeletal: Negative for myalgias and joint pain.  Skin: Negative.   Psychiatric/Behavioral: Negative for depression. The patient is nervous/anxious.      Physical Exam  Constitutional: No distress.  cathectic    Neck: Neck supple. No JVD present.  Cardiovascular: Normal rate, regular rhythm and intact distal pulses.   Respiratory: Effort normal and breath sounds normal. No respiratory distress.  GI: Soft. She exhibits no distension.  Musculoskeletal: She exhibits no edema.  Is able to move all extremities   Neurological: She is alert.  Skin: Skin is warm and dry. She is not diaphoretic.  Psychiatric: not anxious     ASSESSMENT/ PLAN:  1. CKD stage III 2. Left hip pain 3. Chronic systolic and diastolic heart failure  Will setup a renal ultrasound. Will move her nephrology appointment closer. Her renally offending medications have been stopped and will be utilized as needed to manage her heart failure Will setup appoint with Dr. Alvan Dame for her left hip pain Will check bmp in one week     Time spent  with patient 60 minutes.    Ok Edwards NP [facility]  Contact [phone] Monday through Friday 8am- 5pm  After hours call [phone]

## 2014-11-05 ENCOUNTER — Non-Acute Institutional Stay (SKILLED_NURSING_FACILITY): Payer: Medicare Other | Admitting: Internal Medicine

## 2014-11-05 ENCOUNTER — Encounter: Payer: Self-pay | Admitting: Internal Medicine

## 2014-11-05 DIAGNOSIS — E1129 Type 2 diabetes mellitus with other diabetic kidney complication: Secondary | ICD-10-CM

## 2014-11-05 DIAGNOSIS — I5042 Chronic combined systolic (congestive) and diastolic (congestive) heart failure: Secondary | ICD-10-CM

## 2014-11-05 DIAGNOSIS — N058 Unspecified nephritic syndrome with other morphologic changes: Secondary | ICD-10-CM

## 2014-11-05 DIAGNOSIS — R059 Cough, unspecified: Secondary | ICD-10-CM

## 2014-11-05 DIAGNOSIS — I480 Paroxysmal atrial fibrillation: Secondary | ICD-10-CM

## 2014-11-05 DIAGNOSIS — R05 Cough: Secondary | ICD-10-CM

## 2014-11-06 NOTE — Progress Notes (Signed)
Patient ID: Sophia Oliver, female   DOB: 1924/02/05, 81 y.o.   MRN: [id number]    [facility]  PCP: Arletha Grippe DO FACOI Code Status: DNR  Allergies  Allergen Reactions  . Codeine Nausea And Vomiting  . Hydrocodone-Acetaminophen Itching  . Other Nausea And Vomiting    Narcotics  dizziness    Chief Complaint  Patient presents with  . Acute Visit    cough; hx CHF     HPI:  81 yo female seen today as an acute visit for above. She c/o 4 day hx increasing cough with yellow sputum pdtn, hoarseness and rhinorrhea. No worsening SOB. No f/c or CP. No obvious sick contacts. Denies sinus pressure, HA, dizziness, change in appetite or sleep disturbance. No nausea or ear pain/pressure. Per nursing, she and her roommate had the  room window raised over the weekend.  Review of Systems:  As above. All other systems negative   Past Medical History  Diagnosis Date  . Syncope   . Sinus node dysfunction     a. s/p pacemaker.  . Wide-complex tachycardia   . Cholelithiasis   . Glucose intolerance (impaired glucose tolerance)   . Anxiety   . PONV (postoperative nausea and vomiting)   . Chronic kidney disease     a. Baseline Cr reported 1.8-2.0.  . Injury of right upper arm     after venipuncture in 3/13 - pt describes pain afterwards- no followup done seen by PCP- no further treatment   . PAF (paroxysmal atrial fibrillation)   . Left bundle branch block   . Tachycardia-bradycardia syndrome   . CHF (congestive heart failure)     a. EF 30-35% by echo 2015.  [name] Pacemaker     Guidant, implanted 2006  . Type II diabetes mellitus     "recently dx'd" (03/09/2014)  . Breast cancer 2003    "radiation on right; mastectomy on the left"  . Hypertension   . GERD (gastroesophageal reflux disease)   . DJD (degenerative joint disease)   . Arthritis     "real bad in my lower back" (03/09/2014)  . Chronic lower back pain   . Orthostatic hypotension     a. Diuretics d/c'd 03/2014.  [name]  PAT (paroxysmal atrial tachycardia)    Past Surgical History  Procedure Laterality Date  . Replacement total knee bilateral Bilateral   . Total shoulder replacement    . Tonsillectomy    . Orif distal femur fracture Right 12/2007  . Tonsillectomy    . Mastectomy Left 2003  . Total hip arthroplasty  06/16/2012    Procedure: TOTAL HIP ARTHROPLASTY;  Surgeon: Mauri Pole, MD;  Location: [facility];  Service: Orthopedics;  Laterality: Left;  . Hip closed reduction  06/30/2012    Procedure: CLOSED MANIPULATION HIP;  Surgeon: Mauri Pole, MD;  Location: [facility];  Service: Orthopedics;  Laterality: Left;  . Abdominal hysterectomy    . Pacemaker insertion      Guidant Insignia I  . Cataract extraction w/ intraocular lens  implant, bilateral Bilateral 2000's  . Refractive surgery Bilateral 01/2014  . Inguinal hernia repair Left     "had emergency OR for it a few years ago" (03/09/2014)  . Joint replacement    . Breast biopsy Bilateral    Social History:   reports that she has quit smoking. Her smoking use included Cigarettes. She has a 3 pack-year smoking history. She has never used smokeless tobacco. She reports that she  drinks alcohol. She reports that she does not use illicit drugs.  Family History  Problem Relation Age of Onset  . Hypertension      Medications: Patient's Medications  New Prescriptions   No medications on file  Previous Medications   ACETAMINOPHEN (TYLENOL) 500 MG TABLET    Take 500 mg by mouth every 6 (six) hours as needed (joint pain).   ALPRAZOLAM (XANAX) 0.25 MG TABLET    Take one tablet by mouth twice daily for anxiety   ALUM & MAG HYDROXIDE-SIMETH (MAGIC MOUTHWASH) SOLN    Take 10 mLs by mouth 3 (three) times daily as needed for mouth pain (mouth pain).   DICLOFENAC SODIUM (VOLTAREN) 1 % GEL    Apply 2 g topically 4 (four) times daily as needed (joint pain).   DOCUSATE SODIUM 100 MG CAPS    Take 100 mg by mouth 2 (two) times daily.   FEEDING SUPPLEMENT, GLUCERNA  SHAKE, (GLUCERNA SHAKE) LIQD    Take 237 mLs by mouth 3 (three) times daily between meals.   METOPROLOL (LOPRESSOR) 50 MG TABLET    Take 1 tablet (50 mg total) by mouth 2 (two) times daily.   PANTOPRAZOLE (PROTONIX) 40 MG TABLET    Take 40 mg by mouth daily.  Modified Medications   No medications on file  Discontinued Medications   No medications on file     Physical Exam: CONSTITUTIONAL: Looks well in NAD. Awake, alert and oriented x 3 HEENT: PERRLA. No scleral icterus. No conjunctival injection.  Oropharynx clear and without exudate. MMM NECK: Supple. Nontender. No palpable cervical or supraclavicular lymph nodes.  CVS: Regular rate without murmur, gallop or rub. LUNGS: CTA b/l no wheezing, rales or rhonchi. EXTREMITIES: No edema b/l.  Filed Vitals:   11/01/14 1110  BP: 112/84  Pulse: 88  Temp: 98 F (36.7 C)  SpO2: 95%    Labs reviewed:  No recent labs  Assessment/Plan   ICD-9-CM ICD-10-CM   1. Cough - probably due to seasonal allergy vs viral URI 786.2 R05   2. Chronic combined systolic and diastolic CHF (congestive heart failure)- stable 428.42 I50.42    428.0    3. DM (diabetes mellitus) type II controlled with renal manifestation - diet controlled 250.40 E11.29     N05.8   4. Paroxysmal atrial fibrillation - rate controlled on metoprolol 427.31 I48.0    - stop guafenesin syrup. Rx mucinex tabs BID x 7 days  - continue all other medications as Rx  Family/ staff Communication: discussed with pt and nursing   [place] S. Perlie Gold  [facility] [address] [place], [place] [address] [phone] Office (Wednesdays and Fridays 8 AM - 5 PM) [phone] Cell (Monday-Friday 8 AM - 5 PM)

## 2014-11-17 ENCOUNTER — Non-Acute Institutional Stay (SKILLED_NURSING_FACILITY): Payer: Medicare Other | Admitting: Adult Health

## 2014-11-17 DIAGNOSIS — M25552 Pain in left hip: Secondary | ICD-10-CM

## 2014-11-17 DIAGNOSIS — N183 Chronic kidney disease, stage 3 unspecified: Secondary | ICD-10-CM

## 2014-11-17 DIAGNOSIS — I480 Paroxysmal atrial fibrillation: Secondary | ICD-10-CM

## 2014-11-17 DIAGNOSIS — K219 Gastro-esophageal reflux disease without esophagitis: Secondary | ICD-10-CM

## 2014-11-17 DIAGNOSIS — R627 Adult failure to thrive: Secondary | ICD-10-CM

## 2014-11-17 DIAGNOSIS — J9601 Acute respiratory failure with hypoxia: Secondary | ICD-10-CM

## 2014-11-17 DIAGNOSIS — I5042 Chronic combined systolic (congestive) and diastolic (congestive) heart failure: Secondary | ICD-10-CM

## 2014-11-17 DIAGNOSIS — Z950 Presence of cardiac pacemaker: Secondary | ICD-10-CM

## 2014-11-22 ENCOUNTER — Ambulatory Visit (INDEPENDENT_AMBULATORY_CARE_PROVIDER_SITE_OTHER): Payer: Medicare Other | Admitting: Internal Medicine

## 2014-11-22 ENCOUNTER — Encounter: Payer: Self-pay | Admitting: Internal Medicine

## 2014-11-22 VITALS — BP 100/50 | HR 92 | Ht 64.5 in | Wt 113.0 lb

## 2014-11-22 DIAGNOSIS — Z45018 Encounter for adjustment and management of other part of cardiac pacemaker: Secondary | ICD-10-CM

## 2014-11-22 DIAGNOSIS — I480 Paroxysmal atrial fibrillation: Secondary | ICD-10-CM

## 2014-11-22 DIAGNOSIS — I5022 Chronic systolic (congestive) heart failure: Secondary | ICD-10-CM

## 2014-11-22 DIAGNOSIS — I495 Sick sinus syndrome: Secondary | ICD-10-CM

## 2014-11-22 DIAGNOSIS — Z950 Presence of cardiac pacemaker: Secondary | ICD-10-CM

## 2014-11-22 LAB — MDC_IDC_ENUM_SESS_TYPE_INCLINIC
Lead Channel Impedance Value: 2500 Ohm
Lead Channel Pacing Threshold Amplitude: 0.7 V
Lead Channel Pacing Threshold Pulse Width: 1 ms
Lead Channel Pacing Threshold Pulse Width: 1 ms
Lead Channel Sensing Intrinsic Amplitude: 0.3 mV
Lead Channel Sensing Intrinsic Amplitude: 4.6 mV
Lead Channel Setting Pacing Amplitude: 2 V
Lead Channel Setting Pacing Amplitude: 5 V
Lead Channel Setting Pacing Pulse Width: 1 ms
MDC IDC MSMT LEADCHNL RA IMPEDANCE VALUE: 360 Ohm
MDC IDC MSMT LEADCHNL RV PACING THRESHOLD AMPLITUDE: 2.6 V
MDC IDC PG SERIAL: 313770
MDC IDC SESS DTM: 20160119050000
MDC IDC SET LEADCHNL RV SENSING SENSITIVITY: 2.5 mV
MDC IDC STAT BRADY RA PERCENT PACED: 32 %
MDC IDC STAT BRADY RV PERCENT PACED: 32 %

## 2014-11-22 NOTE — Progress Notes (Signed)
Sophia Oliver      Patient Care Team: [name], MD as PCP - General (Internal Medicine)   HPI  Sophia Oliver is a 81 y.o. female seen in followup for a pacemaker implanted for sinus node dysfunction and syncope  She has over recent months noted significant deterioration in systolic function. This dates back to mid December. It is characterized by    dyspnea nocturnal dyspnea and orthopnea and peripheral edema. She was thought to be in atrial fibrillation  She was hospitalized in May 2015 for congestive heart failure. Amiodarone was discontinued because of concerns of neurotoxicity.  She was recently hospitalized again because of pneumonia.  She is currently in rehabilitation. She is close to going home.  Evaluation of left ventricular systolic function at that time had shown an interval decrease from 35->>15%  It was decided not to pursue resynchronization getting comorbidities and age and paucity of symptoms   Past Medical History  Diagnosis Date  . Syncope   . Sinus node dysfunction     a. s/p pacemaker.  . Wide-complex tachycardia   . Cholelithiasis   . Glucose intolerance (impaired glucose tolerance)   . Anxiety   . PONV (postoperative nausea and vomiting)   . Chronic kidney disease     a. Baseline Cr reported 1.8-2.0.  . Injury of right upper arm     after venipuncture in 3/13 - pt describes pain afterwards- no followup done seen by PCP- no further treatment   . PAF (paroxysmal atrial fibrillation)   . Left bundle branch block   . Tachycardia-bradycardia syndrome   . CHF (congestive heart failure)     a. EF 30-35% by echo 2015.  [name] Pacemaker     Guidant, implanted 2006  . Type II diabetes mellitus     "recently dx'd" (03/09/2014)  . Breast cancer 2003    "radiation on right; mastectomy on the left"  . Hypertension   . GERD (gastroesophageal reflux disease)   . DJD (degenerative joint disease)   . Arthritis     "real bad in my lower back" (03/09/2014)  . Chronic lower back  pain   . Orthostatic hypotension     a. Diuretics d/c'd 03/2014.  [name] PAT (paroxysmal atrial tachycardia)     Past Surgical History  Procedure Laterality Date  . Replacement total knee bilateral Bilateral   . Total shoulder replacement    . Tonsillectomy    . Orif distal femur fracture Right 12/2007  . Tonsillectomy    . Mastectomy Left 2003  . Total hip arthroplasty  06/16/2012    Procedure: TOTAL HIP ARTHROPLASTY;  Surgeon: Mauri Pole, MD;  Location: [facility];  Service: Orthopedics;  Laterality: Left;  . Hip closed reduction  06/30/2012    Procedure: CLOSED MANIPULATION HIP;  Surgeon: Mauri Pole, MD;  Location: [facility];  Service: Orthopedics;  Laterality: Left;  . Abdominal hysterectomy    . Pacemaker insertion      Guidant Insignia I  . Cataract extraction w/ intraocular lens  implant, bilateral Bilateral 2000's  . Refractive surgery Bilateral 01/2014  . Inguinal hernia repair Left     "had emergency OR for it a few years ago" (03/09/2014)  . Joint replacement    . Breast biopsy Bilateral     Current Outpatient Prescriptions  Medication Sig Dispense Refill  . acetaminophen (TYLENOL) 500 MG tablet Take 500 mg by mouth every 6 (six) hours as needed (joint pain).    [name] ALPRAZolam (XANAX) 0.25  MG tablet Take one tablet by mouth twice daily for anxiety 60 tablet 5  . Alum & Mag Hydroxide-Simeth (MAGIC MOUTHWASH) SOLN Take 10 mLs by mouth 3 (three) times daily as needed for mouth pain (mouth pain).    . benzonatate (TESSALON) 100 MG capsule Take 100 mg by mouth 3 (three) times daily as needed for cough (every 8 hours by mouth for cough.).    [name] diclofenac sodium (VOLTAREN) 1 % GEL Apply 2 g topically 4 (four) times daily as needed (joint pain).    [name] docusate sodium 100 MG CAPS Take 100 mg by mouth 2 (two) times daily. 10 capsule 0  . guaiFENesin (MUCINEX) 600 MG 12 hr tablet Take 600 mg by mouth 2 (two) times daily.    . metoprolol (LOPRESSOR) 50 MG tablet Take 1 tablet (50 mg total) by  mouth 2 (two) times daily. 60 tablet 0  . mirtazapine (REMERON) 7.5 MG tablet Take 7.5 mg by mouth at bedtime.    . pantoprazole (PROTONIX) 40 MG tablet Take 40 mg by mouth daily.     No current facility-administered medications for this visit.    Allergies  Allergen Reactions  . Codeine Nausea And Vomiting  . Hydrocodone-Acetaminophen Itching  . Other Nausea And Vomiting    Narcotics  dizziness    Review of Systems negative except from HPI and PMH  Physical Exam BP 100/50 mmHg  Pulse 92  Ht 5' 4.5" (1.638 m)  Wt 113 lb (51.256 kg)  BMI 19.10 kg/m2 Well developed and well nourished in no acute distress HENT normal E scleral and icterus clear Neck Supple Clear to ausculation  Regular rate and rhythm, no murmurs gallops or rub Soft with active bowel sounds No clubbing cyanosis  Edema Alert and oriented, grossly normal motor and sensory function Skin Warm and Dry  ECG demonstrates atrial fibrillation at a ventricular rate of 85 intervals-/13/44  Assessment and  Plan  Atrial fibrillation-paroxysmal  Cardiomyopathy-nonischemic  Congestive heart failure-chronic-systolic   Left bundle branch block  Pacemaker-[employer] The patient's device was interrogated.  The information was reviewed. No changes were made in the programming.     she is euvolemic. She is holding sinus rhythm. Blood pressure stable.  We'll see her again in 6 months time in the device clinic.

## 2014-11-22 NOTE — Patient Instructions (Signed)
Your physician recommends that you continue on your current medications as directed. Please refer to the Current Medication list given to you today  6 months with device clinic, you will receive a letter a month before in the mail to make an appt.   Your physician wants you to follow-up in: 12 months with Dr. Caryl Comes. You will receive a reminder letter in the mail two months in advance. If you don't receive a letter, please call our office to schedule the follow-up appointment.

## 2014-11-29 ENCOUNTER — Other Ambulatory Visit: Payer: Self-pay | Admitting: Adult Health

## 2014-11-29 ENCOUNTER — Encounter: Payer: Self-pay | Admitting: Internal Medicine

## 2014-11-29 ENCOUNTER — Non-Acute Institutional Stay (SKILLED_NURSING_FACILITY): Payer: Medicare Other | Admitting: Internal Medicine

## 2014-11-29 DIAGNOSIS — J22 Unspecified acute lower respiratory infection: Secondary | ICD-10-CM

## 2014-11-29 DIAGNOSIS — R05 Cough: Secondary | ICD-10-CM

## 2014-11-29 DIAGNOSIS — J988 Other specified respiratory disorders: Secondary | ICD-10-CM

## 2014-11-29 DIAGNOSIS — I5042 Chronic combined systolic (congestive) and diastolic (congestive) heart failure: Secondary | ICD-10-CM

## 2014-11-29 DIAGNOSIS — N183 Chronic kidney disease, stage 3 unspecified: Secondary | ICD-10-CM

## 2014-11-29 DIAGNOSIS — R059 Cough, unspecified: Secondary | ICD-10-CM

## 2014-11-29 LAB — CBC WITH DIFFERENTIAL/PLATELET
BASOS ABS: 0.1 10*3/uL (ref 0.0–0.1)
BASOS PCT: 1 % (ref 0–1)
Eosinophils Absolute: 0.1 10*3/uL (ref 0.0–0.7)
Eosinophils Relative: 1 % (ref 0–5)
HCT: 32.6 % — ABNORMAL LOW (ref 36.0–46.0)
Hemoglobin: 10.1 g/dL — ABNORMAL LOW (ref 12.0–15.0)
Lymphocytes Relative: 18 % (ref 12–46)
Lymphs Abs: 1.1 10*3/uL (ref 0.7–4.0)
MCH: 28.1 pg (ref 26.0–34.0)
MCHC: 31 g/dL (ref 30.0–36.0)
MCV: 90.8 fL (ref 78.0–100.0)
MONO ABS: 0.7 10*3/uL (ref 0.1–1.0)
MONOS PCT: 11 % (ref 3–12)
MPV: 9.5 fL (ref 8.6–12.4)
Neutro Abs: 4.3 10*3/uL (ref 1.7–7.7)
Neutrophils Relative %: 69 % (ref 43–77)
Platelets: 275 10*3/uL (ref 150–400)
RBC: 3.59 MIL/uL — AB (ref 3.87–5.11)
RDW: 21.2 % — AB (ref 11.5–15.5)
WBC: 6.3 10*3/uL (ref 4.0–10.5)

## 2014-11-29 LAB — BASIC METABOLIC PANEL
BUN: 50 mg/dL — ABNORMAL HIGH (ref 6–23)
CO2: 18 meq/L — AB (ref 19–32)
Calcium: 8.9 mg/dL (ref 8.4–10.5)
Chloride: 109 mEq/L (ref 96–112)
Creat: 1.42 mg/dL — ABNORMAL HIGH (ref 0.50–1.10)
Glucose, Bld: 189 mg/dL — ABNORMAL HIGH (ref 70–99)
Potassium: 4.6 mEq/L (ref 3.5–5.3)
Sodium: 139 mEq/L (ref 135–145)

## 2014-11-29 NOTE — Progress Notes (Signed)
Patient ID: SAMANVI CUCCIA, female   DOB: [birth year], 81 y.o.   MRN: [id number]    [place]     Place of Service: SNF (31)    Allergies  Allergen Reactions  . Codeine Nausea And Vomiting  . Hydrocodone-Acetaminophen Itching  . Other Nausea And Vomiting    Narcotics  dizziness    Chief Complaint  Patient presents with  . Acute Visit    weight gain, SOB; hx CHF and CKD    HPI:  81 yo female long term resident seen today for above. Nursing reports approx 10 lb weight gain in the last month. Lasix and aldactone stopped last month due to AKI (Cr 2.06 on 10/26/14). Last CXR 12/31st showed cardiomegaly, pulmonary edema but no pneumonia. Pt c/o worsening SOB x 1 day with palpitations, chills and swollen neck glands. Her roommate was dx with pneumonia last week. She denies CP, fever, sore throat or leg swelling. No HA or dizziness.  CODE STATUS: Full  Medications: Patient's Medications  New Prescriptions   No medications on file  Previous Medications   ACETAMINOPHEN (TYLENOL) 500 MG TABLET    Take 500 mg by mouth every 6 (six) hours as needed (joint pain).   ALPRAZOLAM (XANAX) 0.25 MG TABLET    Take one tablet by mouth twice daily for anxiety   ALUM & MAG HYDROXIDE-SIMETH (MAGIC MOUTHWASH) SOLN    Take 10 mLs by mouth 3 (three) times daily as needed for mouth pain (mouth pain).   BENZONATATE (TESSALON) 100 MG CAPSULE    Take 100 mg by mouth 3 (three) times daily as needed for cough (every 8 hours by mouth for cough.).   DICLOFENAC SODIUM (VOLTAREN) 1 % GEL    Apply 2 g topically 4 (four) times daily as needed (joint pain).   DOCUSATE SODIUM 100 MG CAPS    Take 100 mg by mouth 2 (two) times daily.   GUAIFENESIN (MUCINEX) 600 MG 12 HR TABLET    Take 600 mg by mouth 2 (two) times daily.   METOPROLOL (LOPRESSOR) 50 MG TABLET    Take 1 tablet (50 mg total) by mouth 2 (two) times daily.   MIRTAZAPINE (REMERON) 7.5 MG TABLET    Take 7.5 mg by mouth at bedtime.   PANTOPRAZOLE (PROTONIX) 40 MG TABLET    Take 40 mg by mouth daily.  Modified Medications   No medications on file  Discontinued Medications   No medications on file     Review of Systems  As above. All other systems reviewed are negative  Filed Vitals:   11/29/14 1629  BP: 82/52  Pulse: 113  Temp: 99.6 F (37.6 C)  Resp: 24  SpO2: 95%   There is no weight on file to calculate BMI.  Physical Exam CONSTITUTIONAL: Looks ill with min conversational dyspnea. Awake, alert and oriented x 3 HEENT: PERRLA. Oropharynx clear and without exudate. MMM NECK: Supple. Nontender. No palpable cervical or supraclavicular lymph nodes.  CVS: Tachy without murmur, gallop or rub. LUNGS: R>L basilar rales but no wheezing or rhonchi. EXTREMITIES: Trace LE edema b/l. Distal pulses palpable. PSYCH: anxious   Labs reviewed: Office Visit on 11/22/2014  Component Date Value Ref Range Status  . Date Time Interrogation Session 11/22/2014 [id number]   Final  . Pulse Generator Manufacturer 11/22/2014 Guidant   Final  . Pulse Gen Model 11/22/2014 [address]   Final  . Pulse Gen Serial Number 11/22/2014 [id number]   Final  . RV  Sense Sensitivity 11/22/2014 2.5   Final  . RV Adaptation Mode 11/22/2014 Fixed Pacing   Final  . RA Pace Amplitude 11/22/2014 2.0   Final  . RV Pace PulseWidth 11/22/2014 1.00   Final  . RV Pace Amplitude 11/22/2014 5.0   Final  . RA Impedance 11/22/2014 360   Final  . RA Amplitude 11/22/2014 0.3   Final  . RA Pacing Amplitude 11/22/2014 0.7   Final  . RA Pacing PulseWidth 11/22/2014 1.00   Final  . RV IMPEDANCE 11/22/2014 2500*  Final  . RV Amplitude 11/22/2014 4.6   Final  . RV Pacing Amplitude 11/22/2014 2.6   Final  . RV Pacing PulseWidth 11/22/2014 1.00   Final  . Battery Status 11/22/2014 BOS   Final  . Loletha Grayer RA Perc Paced 11/22/2014 32   Final  . Loletha Grayer RV Perc Paced 11/22/2014 32   Final  . Eval Rhythm 11/22/2014 SR 60s   Final  . Miscellaneous Comment  11/22/2014    Final                   Value:Pacemaker check in clinic. Normal device function. Thresholds, sensing, impedances consistent with previous measurements-- chronic RV impedence >2500. Device programmed to maximize longevity. ATR episodes detected-- pk V 240bpm. No high ventricular rates  noted. Device programmed at appropriate safety margins. Histogram distribution appropriate for patient activity level. Device programmed to optimize intrinsic conduction. Estimated longevity 1.5 years. Plan to return to device clinic in 6 months, ROV  with SK in 12 months.   Nursing Home on 09/13/2014  Component Date Value Ref Range Status  . Glucose 09/12/2014 76   Final  . BUN 09/12/2014 65* 4 - 21 mg/dL Final  . Creatinine 09/12/2014 2.4* 0.5 - 1.1 mg/dL Final  . Potassium 09/12/2014 5.3  3.4 - 5.3 mmol/L Final  . Sodium 09/12/2014 136* 137 - 147 mmol/L Final   Chart reviewed   Assessment/Plan   ICD-9-CM ICD-10-CM   1. Lower respiratory infection (e.g., bronchitis, pneumonia, pneumonitis, pulmonitis) 519.8 J98.8   2. Chronic combined systolic and diastolic CHF (congestive heart failure) with possible mild exacerbation 428.42 I50.42    428.0    3. Cough probably due to #1 786.2 R05   4. CKD (chronic kidney disease), stage III 585.3 N18.3    --check stat CBC w diff, BMP  --obtain CXR if sx's do not improve or if sx's worsen  --give 1 gm Rocephin IM x 1 then Rx levaquin 750mg  q48hrs x 5 doses  --resume low dose lasix daily as kidney fxn now at baseline (Cr 1.3). Needs nephrology eval for CKD.   --continue Rodriguez Hevia O2 @ 2L/min  --f/u with cardiology Dr Terrence Dupont as scheduled  --will follow    [facility] S. Perlie Gold  [facility] [address] [place], [place] [address] [phone] Office (Wednesdays and Fridays 8 AM - 5 PM) [phone] Cell (Monday-Friday 8 AM - 5 PM)

## 2014-11-30 LAB — BRAIN NATRIURETIC PEPTIDE: Brain Natriuretic Peptide: 1685.1 pg/mL — ABNORMAL HIGH (ref 0.0–100.0)

## 2014-12-05 ENCOUNTER — Other Ambulatory Visit: Payer: Self-pay | Admitting: Internal Medicine

## 2014-12-05 LAB — BRAIN NATRIURETIC PEPTIDE: Brain Natriuretic Peptide: 1311 pg/mL — ABNORMAL HIGH (ref 0.0–100.0)

## 2014-12-05 LAB — BASIC METABOLIC PANEL
BUN: 46 mg/dL — AB (ref 6–23)
CALCIUM: 8.6 mg/dL (ref 8.4–10.5)
CO2: 23 meq/L (ref 19–32)
CREATININE: 1.61 mg/dL — AB (ref 0.50–1.10)
Chloride: 109 mEq/L (ref 96–112)
Glucose, Bld: 85 mg/dL (ref 70–99)
Potassium: 3.9 mEq/L (ref 3.5–5.3)
SODIUM: 143 meq/L (ref 135–145)

## 2014-12-09 ENCOUNTER — Emergency Department (HOSPITAL_COMMUNITY): Payer: Medicare Other

## 2014-12-09 ENCOUNTER — Encounter (HOSPITAL_COMMUNITY): Payer: Self-pay | Admitting: Emergency Medicine

## 2014-12-09 ENCOUNTER — Inpatient Hospital Stay (HOSPITAL_COMMUNITY)
Admission: EM | Admit: 2014-12-09 | Discharge: 2015-01-03 | DRG: 682 | Disposition: E | Payer: Medicare Other | Attending: Internal Medicine | Admitting: Internal Medicine

## 2014-12-09 DIAGNOSIS — F419 Anxiety disorder, unspecified: Secondary | ICD-10-CM | POA: Diagnosis present

## 2014-12-09 DIAGNOSIS — I129 Hypertensive chronic kidney disease with stage 1 through stage 4 chronic kidney disease, or unspecified chronic kidney disease: Secondary | ICD-10-CM | POA: Diagnosis present

## 2014-12-09 DIAGNOSIS — B9620 Unspecified Escherichia coli [E. coli] as the cause of diseases classified elsewhere: Secondary | ICD-10-CM | POA: Diagnosis present

## 2014-12-09 DIAGNOSIS — Z87891 Personal history of nicotine dependence: Secondary | ICD-10-CM

## 2014-12-09 DIAGNOSIS — Z96653 Presence of artificial knee joint, bilateral: Secondary | ICD-10-CM | POA: Diagnosis present

## 2014-12-09 DIAGNOSIS — E43 Unspecified severe protein-calorie malnutrition: Secondary | ICD-10-CM | POA: Diagnosis present

## 2014-12-09 DIAGNOSIS — Z681 Body mass index (BMI) 19 or less, adult: Secondary | ICD-10-CM

## 2014-12-09 DIAGNOSIS — M1990 Unspecified osteoarthritis, unspecified site: Secondary | ICD-10-CM | POA: Diagnosis present

## 2014-12-09 DIAGNOSIS — I480 Paroxysmal atrial fibrillation: Secondary | ICD-10-CM | POA: Diagnosis present

## 2014-12-09 DIAGNOSIS — I447 Left bundle-branch block, unspecified: Secondary | ICD-10-CM | POA: Diagnosis present

## 2014-12-09 DIAGNOSIS — R1312 Dysphagia, oropharyngeal phase: Secondary | ICD-10-CM | POA: Diagnosis present

## 2014-12-09 DIAGNOSIS — N058 Unspecified nephritic syndrome with other morphologic changes: Secondary | ICD-10-CM

## 2014-12-09 DIAGNOSIS — R1310 Dysphagia, unspecified: Secondary | ICD-10-CM

## 2014-12-09 DIAGNOSIS — N183 Chronic kidney disease, stage 3 unspecified: Secondary | ICD-10-CM | POA: Diagnosis present

## 2014-12-09 DIAGNOSIS — E042 Nontoxic multinodular goiter: Secondary | ICD-10-CM | POA: Diagnosis present

## 2014-12-09 DIAGNOSIS — E876 Hypokalemia: Secondary | ICD-10-CM | POA: Diagnosis present

## 2014-12-09 DIAGNOSIS — R911 Solitary pulmonary nodule: Secondary | ICD-10-CM | POA: Diagnosis present

## 2014-12-09 DIAGNOSIS — I5022 Chronic systolic (congestive) heart failure: Secondary | ICD-10-CM

## 2014-12-09 DIAGNOSIS — Z96642 Presence of left artificial hip joint: Secondary | ICD-10-CM | POA: Diagnosis present

## 2014-12-09 DIAGNOSIS — Z66 Do not resuscitate: Secondary | ICD-10-CM | POA: Diagnosis present

## 2014-12-09 DIAGNOSIS — R0600 Dyspnea, unspecified: Secondary | ICD-10-CM

## 2014-12-09 DIAGNOSIS — N179 Acute kidney failure, unspecified: Principal | ICD-10-CM | POA: Diagnosis present

## 2014-12-09 DIAGNOSIS — N390 Urinary tract infection, site not specified: Secondary | ICD-10-CM

## 2014-12-09 DIAGNOSIS — R221 Localized swelling, mass and lump, neck: Secondary | ICD-10-CM

## 2014-12-09 DIAGNOSIS — I5043 Acute on chronic combined systolic (congestive) and diastolic (congestive) heart failure: Secondary | ICD-10-CM | POA: Diagnosis present

## 2014-12-09 DIAGNOSIS — Z96619 Presence of unspecified artificial shoulder joint: Secondary | ICD-10-CM | POA: Diagnosis present

## 2014-12-09 DIAGNOSIS — E1129 Type 2 diabetes mellitus with other diabetic kidney complication: Secondary | ICD-10-CM | POA: Diagnosis present

## 2014-12-09 DIAGNOSIS — E1122 Type 2 diabetes mellitus with diabetic chronic kidney disease: Secondary | ICD-10-CM | POA: Diagnosis present

## 2014-12-09 DIAGNOSIS — Z950 Presence of cardiac pacemaker: Secondary | ICD-10-CM | POA: Diagnosis not present

## 2014-12-09 DIAGNOSIS — E860 Dehydration: Secondary | ICD-10-CM | POA: Diagnosis present

## 2014-12-09 DIAGNOSIS — E440 Moderate protein-calorie malnutrition: Secondary | ICD-10-CM | POA: Diagnosis present

## 2014-12-09 DIAGNOSIS — R0602 Shortness of breath: Secondary | ICD-10-CM | POA: Diagnosis not present

## 2014-12-09 DIAGNOSIS — J90 Pleural effusion, not elsewhere classified: Secondary | ICD-10-CM

## 2014-12-09 LAB — TSH: TSH: 3.736 u[IU]/mL (ref 0.350–4.500)

## 2014-12-09 LAB — URINE MICROSCOPIC-ADD ON

## 2014-12-09 LAB — CBC WITH DIFFERENTIAL/PLATELET
BASOS ABS: 0 10*3/uL (ref 0.0–0.1)
Basophils Relative: 0 % (ref 0–1)
EOS ABS: 0 10*3/uL (ref 0.0–0.7)
EOS PCT: 1 % (ref 0–5)
HEMATOCRIT: 30.9 % — AB (ref 36.0–46.0)
HEMOGLOBIN: 10.1 g/dL — AB (ref 12.0–15.0)
LYMPHS PCT: 10 % — AB (ref 12–46)
Lymphs Abs: 0.9 10*3/uL (ref 0.7–4.0)
MCH: 27.7 pg (ref 26.0–34.0)
MCHC: 32.7 g/dL (ref 30.0–36.0)
MCV: 84.7 fL (ref 78.0–100.0)
Monocytes Absolute: 1 10*3/uL (ref 0.1–1.0)
Monocytes Relative: 11 % (ref 3–12)
NEUTROS PCT: 78 % — AB (ref 43–77)
Neutro Abs: 7 10*3/uL (ref 1.7–7.7)
PLATELETS: 210 10*3/uL (ref 150–400)
RBC: 3.65 MIL/uL — ABNORMAL LOW (ref 3.87–5.11)
RDW: 21.1 % — ABNORMAL HIGH (ref 11.5–15.5)
WBC: 9.7 10*3/uL (ref 4.0–10.5)

## 2014-12-09 LAB — URINALYSIS, ROUTINE W REFLEX MICROSCOPIC
Bilirubin Urine: NEGATIVE
Glucose, UA: NEGATIVE mg/dL
Hgb urine dipstick: NEGATIVE
KETONES UR: 15 mg/dL — AB
Nitrite: POSITIVE — AB
Protein, ur: NEGATIVE mg/dL
SPECIFIC GRAVITY, URINE: 1.022 (ref 1.005–1.030)
UROBILINOGEN UA: 0.2 mg/dL (ref 0.0–1.0)
pH: 5 (ref 5.0–8.0)

## 2014-12-09 LAB — BASIC METABOLIC PANEL
Anion gap: 13 (ref 5–15)
BUN: 72 mg/dL — ABNORMAL HIGH (ref 6–23)
CO2: 22 mmol/L (ref 19–32)
CREATININE: 2.73 mg/dL — AB (ref 0.50–1.10)
Calcium: 8.4 mg/dL (ref 8.4–10.5)
Chloride: 104 mmol/L (ref 96–112)
GFR calc Af Amer: 17 mL/min — ABNORMAL LOW (ref >90)
GFR, EST NON AFRICAN AMERICAN: 14 mL/min — AB (ref >90)
Glucose, Bld: 122 mg/dL — ABNORMAL HIGH (ref 70–99)
POTASSIUM: 3.7 mmol/L (ref 3.5–5.1)
Sodium: 139 mmol/L (ref 135–145)

## 2014-12-09 LAB — TROPONIN I
TROPONIN I: 0.06 ng/mL — AB (ref <0.031)
Troponin I: 0.05 ng/mL — ABNORMAL HIGH (ref <0.031)

## 2014-12-09 LAB — BRAIN NATRIURETIC PEPTIDE: B NATRIURETIC PEPTIDE 5: 1510 pg/mL — AB (ref 0.0–100.0)

## 2014-12-09 LAB — GLUCOSE, CAPILLARY
Glucose-Capillary: 98 mg/dL (ref 70–99)
Glucose-Capillary: 128 mg/dL — ABNORMAL HIGH (ref 70–99)

## 2014-12-09 LAB — MRSA PCR SCREENING: MRSA by PCR: POSITIVE — AB

## 2014-12-09 MED ORDER — SODIUM CHLORIDE 0.9 % IV BOLUS (SEPSIS)
500.0000 mL | Freq: Once | INTRAVENOUS | Status: AC
  Administered 2014-12-09: 500 mL via INTRAVENOUS

## 2014-12-09 MED ORDER — MUPIROCIN 2 % EX OINT
1.0000 "application " | TOPICAL_OINTMENT | Freq: Two times a day (BID) | CUTANEOUS | Status: DC
  Administered 2014-12-10 – 2014-12-13 (×8): 1 via NASAL
  Filled 2014-12-09 (×2): qty 22

## 2014-12-09 MED ORDER — HEPARIN SODIUM (PORCINE) 5000 UNIT/ML IJ SOLN: 5000.0000 [IU] | Freq: Three times a day (TID) | INTRAMUSCULAR | Status: DC

## 2014-12-09 MED ORDER — ONDANSETRON HCL 4 MG/2ML IJ SOLN
4.0000 mg | Freq: Four times a day (QID) | INTRAMUSCULAR | Status: DC | PRN
  Administered 2014-12-11 – 2014-12-12 (×2): 4 mg via INTRAVENOUS
  Filled 2014-12-09 (×3): qty 2

## 2014-12-09 MED ORDER — ALPRAZOLAM 0.25 MG PO TABS: 0.2500 mg | ORAL_TABLET | Freq: Two times a day (BID) | ORAL | Status: DC | PRN

## 2014-12-09 MED ORDER — ONDANSETRON HCL 4 MG PO TABS: 4.0000 mg | ORAL_TABLET | Freq: Four times a day (QID) | ORAL | Status: DC | PRN

## 2014-12-09 MED ORDER — METOPROLOL TARTRATE 25 MG PO TABS: 50.0000 mg | ORAL_TABLET | Freq: Two times a day (BID) | ORAL | Status: DC

## 2014-12-09 MED ORDER — LACTATED RINGERS IV SOLN: INTRAVENOUS | Status: AC

## 2014-12-09 MED ORDER — CEFTRIAXONE SODIUM 1 G IJ SOLR
1.0000 g | Freq: Once | INTRAMUSCULAR | Status: AC
  Administered 2014-12-09: 1 g via INTRAVENOUS
  Filled 2014-12-09: qty 10

## 2014-12-09 MED ORDER — HEPARIN SODIUM (PORCINE) 5000 UNIT/ML IJ SOLN
5000.0000 [IU] | Freq: Three times a day (TID) | INTRAMUSCULAR | Status: DC
  Administered 2014-12-09 – 2014-12-13 (×12): 5000 [IU] via SUBCUTANEOUS
  Filled 2014-12-09 (×13): qty 1

## 2014-12-09 MED ORDER — ACETAMINOPHEN 325 MG PO TABS: 650.0000 mg | ORAL_TABLET | Freq: Four times a day (QID) | ORAL | Status: DC | PRN

## 2014-12-09 MED ORDER — SODIUM CHLORIDE 0.9 % IJ SOLN
3.0000 mL | Freq: Two times a day (BID) | INTRAMUSCULAR | Status: DC
  Administered 2014-12-09 – 2014-12-13 (×5): 3 mL via INTRAVENOUS

## 2014-12-09 MED ORDER — SODIUM CHLORIDE 0.9 % IV SOLN
INTRAVENOUS | Status: DC
  Administered 2014-12-09: 19:00:00 via INTRAVENOUS

## 2014-12-09 MED ORDER — ACETAMINOPHEN 650 MG RE SUPP
650.0000 mg | Freq: Four times a day (QID) | RECTAL | Status: DC | PRN
  Administered 2014-12-12: 650 mg via RECTAL
  Filled 2014-12-09: qty 1

## 2014-12-09 MED ORDER — INSULIN ASPART 100 UNIT/ML ~~LOC~~ SOLN
0.0000 [IU] | SUBCUTANEOUS | Status: DC
  Administered 2014-12-11: 2 [IU] via SUBCUTANEOUS
  Administered 2014-12-11: 3 [IU] via SUBCUTANEOUS
  Administered 2014-12-12: 2 [IU] via SUBCUTANEOUS
  Administered 2014-12-12: 3 [IU] via SUBCUTANEOUS
  Administered 2014-12-12 (×2): 2 [IU] via SUBCUTANEOUS
  Administered 2014-12-13 (×3): 3 [IU] via SUBCUTANEOUS
  Administered 2014-12-13 (×2): 2 [IU] via SUBCUTANEOUS

## 2014-12-09 MED ORDER — METOPROLOL TARTRATE 1 MG/ML IV SOLN
2.5000 mg | Freq: Four times a day (QID) | INTRAVENOUS | Status: DC
  Administered 2014-12-11 – 2014-12-13 (×10): 2.5 mg via INTRAVENOUS
  Filled 2014-12-09 (×16): qty 5

## 2014-12-09 MED ORDER — CHLORHEXIDINE GLUCONATE CLOTH 2 % EX PADS
6.0000 | MEDICATED_PAD | Freq: Every day | CUTANEOUS | Status: DC
  Administered 2014-12-10 – 2014-12-13 (×4): 6 via TOPICAL

## 2014-12-09 NOTE — H&P (Signed)
[facility] History and Physical  Sophia Oliver EGB:[id number] DOB: 1924/03/24 DOA: 12/23/2014  Referring physician: Emergency Department PCP: [name], MD  Specialists:   Chief Complaint: SOB, dysphagia  HPI: Sophia Oliver is a 81 y.o. female  With a hx of chronic combined systolic-diastolic chf with EF around 30%, dm, sinus node dysfunction s/p pacemaker placement who presents from facility with concerns of sob and dysphagia. In the ED, pt was noted to have BNP of 1311 with CXR demonstrating cardiomegaly with possible mild interstitial edema. The patient was noted to have a Cr of 2.73 (was 1.61 on 2/1).  The patient was given one 500 bolus of NS and the hospitalist consulted for consideration for admission. Of note, UA was notable for nitrites, leukocytes with many bacteria. She was started on empiric rocephin.  Review of Systems: Dysphagia, sob, remainder of 10pt ros reviewed and are neg  Past Medical History  Diagnosis Date  . Syncope   . Sinus node dysfunction     a. s/p pacemaker.  . Wide-complex tachycardia   . Cholelithiasis   . Glucose intolerance (impaired glucose tolerance)   . Anxiety   . PONV (postoperative nausea and vomiting)   . Chronic kidney disease     a. Baseline Cr reported 1.8-2.0.  . Injury of right upper arm     after venipuncture in 3/13 - pt describes pain afterwards- no followup done seen by PCP- no further treatment   . PAF (paroxysmal atrial fibrillation)   . Left bundle branch block   . Tachycardia-bradycardia syndrome   . CHF (congestive heart failure)     a. EF 30-35% by echo 2015.  [name] Pacemaker     Guidant, implanted 2006  . Type II diabetes mellitus     "recently dx'd" (03/09/2014)  . Breast cancer 2003    "radiation on right; mastectomy on the left"  . Hypertension   . GERD (gastroesophageal reflux disease)   . DJD (degenerative joint disease)   . Arthritis     "real bad in my lower back" (03/09/2014)  . Chronic lower back pain   .  Orthostatic hypotension     a. Diuretics d/c'd 03/2014.  [name] PAT (paroxysmal atrial tachycardia)    Past Surgical History  Procedure Laterality Date  . Replacement total knee bilateral Bilateral   . Total shoulder replacement    . Tonsillectomy    . Orif distal femur fracture Right 12/2007  . Tonsillectomy    . Mastectomy Left 2003  . Total hip arthroplasty  06/16/2012    Procedure: TOTAL HIP ARTHROPLASTY;  Surgeon: Mauri Pole, MD;  Location: [facility];  Service: Orthopedics;  Laterality: Left;  . Hip closed reduction  06/30/2012    Procedure: CLOSED MANIPULATION HIP;  Surgeon: Mauri Pole, MD;  Location: [facility];  Service: Orthopedics;  Laterality: Left;  . Abdominal hysterectomy    . Pacemaker insertion      Guidant Insignia I  . Cataract extraction w/ intraocular lens  implant, bilateral Bilateral 2000's  . Refractive surgery Bilateral 01/2014  . Inguinal hernia repair Left     "had emergency OR for it a few years ago" (03/09/2014)  . Joint replacement    . Breast biopsy Bilateral    Social History:  reports that she has quit smoking. Her smoking use included Cigarettes. She has a 3 pack-year smoking history. She has never used smokeless tobacco. She reports that she drinks alcohol. She reports that she does not use illicit  drugs.  where does patient live--home, ALF, SNF? and with whom if at home?  Can patient participate in ADLs?  Allergies  Allergen Reactions  . Codeine Nausea And Vomiting  . Hydrocodone-Acetaminophen Itching  . Other Nausea And Vomiting    Narcotics  dizziness    Family History  Problem Relation Age of Onset  . Hypertension      (be sure to complete)  Prior to Admission medications   Medication Sig Start Date End Date Taking? Authorizing Provider  acetaminophen (TYLENOL) 500 MG tablet Take 500 mg by mouth every 6 (six) hours as needed (joint pain).    Historical Provider, MD  ALPRAZolam Duanne Moron) 0.25 MG tablet Take one tablet by mouth twice daily for  anxiety 10/18/14   Estill Dooms, MD  Alum & Mag Hydroxide-Simeth (MAGIC MOUTHWASH) SOLN Take 10 mLs by mouth 3 (three) times daily as needed for mouth pain (mouth pain).    Historical Provider, MD  benzonatate (TESSALON) 100 MG capsule Take 100 mg by mouth 3 (three) times daily as needed for cough (every 8 hours by mouth for cough.).    Historical Provider, MD  diclofenac sodium (VOLTAREN) 1 % GEL Apply 2 g topically 4 (four) times daily as needed (joint pain).    Historical Provider, MD  docusate sodium 100 MG CAPS Take 100 mg by mouth 2 (two) times daily. 05/29/14   Theodis Blaze, MD  guaiFENesin (MUCINEX) 600 MG 12 hr tablet Take 600 mg by mouth 2 (two) times daily.    Historical Provider, MD  metoprolol (LOPRESSOR) 50 MG tablet Take 1 tablet (50 mg total) by mouth 2 (two) times daily. 08/25/14   Janece Canterbury, MD  mirtazapine (REMERON) 7.5 MG tablet Take 7.5 mg by mouth at bedtime.    Historical Provider, MD  pantoprazole (PROTONIX) 40 MG tablet Take 40 mg by mouth daily.    Historical Provider, MD   Physical Exam: Filed Vitals:   12/11/2014 1300 12/08/2014 1330 12/25/2014 1400 12/27/2014 1411  BP: 114/76 114/76 108/64 108/64  Pulse: 88 98 97 96  Temp:      TempSrc:      Resp: 16 26 35   SpO2: 100% 100% 100% 100%     General:  Awake, in nad  Eyes: PERRL B  ENT: Membranes dry, dentition fair  Neck: trachea midline, L sided neck mass  Cardiovascular: regular, s1, s2  Respiratory: normal resp effort, no wheezing  Abdomen: soft,nondistended, nondtender  Skin: decreased skin turgor, no abnormal skin lesions seen  Musculoskeletal: perfused, no clubbing  Psychiatric: mood/affect normal // no auditory/visual hallucinations  Neurologic: cn2-12 grossly intact, strength/sensation intact  Labs on Admission:  Basic Metabolic Panel:  Recent Labs Lab 12/05/14 0510 12/14/2014 1322  NA 143 139  K 3.9 3.7  CL 109 104  CO2 23 22  GLUCOSE 85 122*  BUN 46* 72*  CREATININE 1.61* 2.73*   CALCIUM 8.6 8.4   Liver Function Tests: No results for input(s): AST, ALT, ALKPHOS, BILITOT, PROT, ALBUMIN in the last 168 hours. No results for input(s): LIPASE, AMYLASE in the last 168 hours. No results for input(s): AMMONIA in the last 168 hours. CBC:  Recent Labs Lab 12/23/2014 1322  WBC 9.7  NEUTROABS 7.0  HGB 10.1*  HCT 30.9*  MCV 84.7  PLT 210   Cardiac Enzymes:  Recent Labs Lab 12/08/2014 1322  TROPONINI 0.06*    BNP (last 3 results) No results for input(s): BNP in the last 8760 hours.  ProBNP (last  3 results)  Recent Labs  06/24/14 1113 08/21/14 1655 08/21/14 2139  PROBNP 16090.0* 37601.0* 42122.0*    CBG: No results for input(s): GLUCAP in the last 168 hours.  Radiological Exams on Admission: Dg Chest 2 View  12/19/2014   CLINICAL DATA:  Mid chest pain, cough, dyspnea  EXAM: CHEST  2 VIEW  COMPARISON:  08/24/2014  FINDINGS: Cardiomegaly with pulmonary vascular congestion and possible mild interstitial edema.  Suspected moderate left and small right pleural effusions. Associated lower lobe opacities, likely atelectasis.  Right subclavian pacemaker.  Right shoulder arthroplasty. Chronic degenerative changes with deformity of the left shoulder.  IMPRESSION: Cardiomegaly with possible mild interstitial edema.  Suspected moderate left and small right pleural effusions. Associated lower lobe opacities, likely atelectasis.   Electronically Signed   By: Julian Hy M.D.   On: 12/24/2014 14:37    EKG: Independently reviewed. Afib  Assessment/Plan Principal Problem:   ARF (acute renal failure) Active Problems:   CKD (chronic kidney disease), stage III   Pacemaker   PAF (paroxysmal atrial fibrillation)   Malnutrition of moderate degree   Acute on chronic combined systolic and diastolic CHF (congestive heart failure)   DM (diabetes mellitus) type II controlled with renal manifestation   Paroxysmal atrial fibrillation   AKI (acute kidney injury)  1. Acute  on chronic stage 3 CKD 1. Gentle bolus of IVF given in ED 2. Cont with gentle IVF and monitor i/o and daily wts 3. Hold diuretics for the time being 4. Follow renal function closely 2. PAF 1. Noted on presenting EKG 2. CHADS of at least 3. Unclear why pt is not on anticoagulation. She is, however, closely followed by Cardiology, last seen by Dr. Caryl Comes on 11/22/14 3. Currently rate controlled 4. Monitor for now 3. Combined chronic and diastolic chf 1. Clinically dehydrated 2. Gentle IVF per above 3. Monitor i/o and daily wts 4. DM2 1. Cont on SSI coverage 2. Presenting random glucose stable 5. UTI 1. UA suggestive of UTI 2. Follow up on urine culture 3. Cont on empiric rocephin 6. Moderate protein calorie malnutrition 1. Consult nutrition 7. DVT prophylaxis 1. Heparin subQ 8. Multinodular goiter with resultant dysphagia 1. Neck mass noted on exam 2. Follow up US of neck demonstrates multinodular goiter with none of the nodules meeting imaging criteria to recommend percutaneous sampling 3. Will check TSH and free T4 4. SLP consulted 9. Weakness 1. Consult PT/OT  Code Status: DNR, confirmed with husband (must indicate code status--if unknown or must be presumed, indicate so) Family Communication: Pt in room, family at bedside (indicate person spoken with, if applicable, with phone number if by telephone) Disposition Plan: Pending (indicate anticipated LOS)   CHIU, STEPHEN K [facility] Pager [phone]  If 7PM-7AM, please contact night-coverage [email or web address] Password [email or web address] 12/15/2014, 3:30 PM

## 2014-12-09 NOTE — ED Notes (Signed)
Patient transported to X-ray 

## 2014-12-09 NOTE — ED Provider Notes (Addendum)
CSN: [id number]     Arrival date & time 12/08/2014  52 History   First MD Initiated Contact with Patient 12/14/2014 1225     Chief Complaint  Patient presents with  . Dysphagia  . Shortness of Breath     (Consider location/radiation/quality/duration/timing/severity/associated sxs/prior Treatment) HPI Comments: 81 year old female with past medical history of systolic and diastolic heart failure with ejection fraction of 15-20%, atrial fibrillation with tachybradycardia syndrome status post pacemaker, chronic kidney disease stage III comes in from [place] home with concerns for heart failure worsening. Pt c/o knot in her neck here, L sided. She reports that the feeling is new. There is no trouble breathing, + occasional swallowing difficulty. Pt has no active chest pain. When she gets chest discomfort, she describes that as feeling like there is a knot. There is no n/v/f/c/dib/cough/wheezing. However, nursing home documentation reports increased swelling and increased breathing - and that there is concerns for CHF.   ROS 10 Systems reviewed and are negative for acute change except as noted in the HPI.     Patient is a 81 y.o. female presenting with shortness of breath. The history is provided by the patient and medical records.  Shortness of Breath   Past Medical History  Diagnosis Date  . Syncope   . Sinus node dysfunction     a. s/p pacemaker.  . Wide-complex tachycardia   . Cholelithiasis   . Glucose intolerance (impaired glucose tolerance)   . Anxiety   . PONV (postoperative nausea and vomiting)   . Chronic kidney disease     a. Baseline Cr reported 1.8-2.0.  . Injury of right upper arm     after venipuncture in 3/13 - pt describes pain afterwards- no followup done seen by PCP- no further treatment   . PAF (paroxysmal atrial fibrillation)   . Left bundle branch block   . Tachycardia-bradycardia syndrome   . CHF (congestive heart failure)     a. EF 30-35% by echo  2015.  [name] Pacemaker     Guidant, implanted 2006  . Type II diabetes mellitus     "recently dx'd" (03/09/2014)  . Breast cancer 2003    "radiation on right; mastectomy on the left"  . Hypertension   . GERD (gastroesophageal reflux disease)   . DJD (degenerative joint disease)   . Arthritis     "real bad in my lower back" (03/09/2014)  . Chronic lower back pain   . Orthostatic hypotension     a. Diuretics d/c'd 03/2014.  [name] PAT (paroxysmal atrial tachycardia)    Past Surgical History  Procedure Laterality Date  . Replacement total knee bilateral Bilateral   . Total shoulder replacement    . Tonsillectomy    . Orif distal femur fracture Right 12/2007  . Tonsillectomy    . Mastectomy Left 2003  . Total hip arthroplasty  06/16/2012    Procedure: TOTAL HIP ARTHROPLASTY;  Surgeon: Mauri Pole, MD;  Location: [facility];  Service: Orthopedics;  Laterality: Left;  . Hip closed reduction  06/30/2012    Procedure: CLOSED MANIPULATION HIP;  Surgeon: Mauri Pole, MD;  Location: [facility];  Service: Orthopedics;  Laterality: Left;  . Abdominal hysterectomy    . Pacemaker insertion      Guidant Insignia I  . Cataract extraction w/ intraocular lens  implant, bilateral Bilateral 2000's  . Refractive surgery Bilateral 01/2014  . Inguinal hernia repair Left     "had emergency OR for it a few years  ago" (03/09/2014)  . Joint replacement    . Breast biopsy Bilateral    Family History  Problem Relation Age of Onset  . Hypertension     History  Substance Use Topics  . Smoking status: Former Smoker -- 0.50 packs/day for 6 years    Types: Cigarettes  . Smokeless tobacco: Never Used  . Alcohol Use: Yes     Comment: "I'll have a social drink maybe at Chrismastime"   OB History    No data available     Review of Systems  Respiratory: Positive for shortness of breath.       Allergies  Codeine; Hydrocodone-acetaminophen; and Other  Home Medications   Prior to Admission medications   Medication  Sig Start Date End Date Taking? Authorizing Provider  acetaminophen (TYLENOL) 500 MG tablet Take 500 mg by mouth every 6 (six) hours as needed (joint pain).    Historical Provider, MD  ALPRAZolam Duanne Moron) 0.25 MG tablet Take one tablet by mouth twice daily for anxiety 10/18/14   Estill Dooms, MD  Alum & Mag Hydroxide-Simeth (MAGIC MOUTHWASH) SOLN Take 10 mLs by mouth 3 (three) times daily as needed for mouth pain (mouth pain).    Historical Provider, MD  benzonatate (TESSALON) 100 MG capsule Take 100 mg by mouth 3 (three) times daily as needed for cough (every 8 hours by mouth for cough.).    Historical Provider, MD  diclofenac sodium (VOLTAREN) 1 % GEL Apply 2 g topically 4 (four) times daily as needed (joint pain).    Historical Provider, MD  docusate sodium 100 MG CAPS Take 100 mg by mouth 2 (two) times daily. 05/29/14   Theodis Blaze, MD  guaiFENesin (MUCINEX) 600 MG 12 hr tablet Take 600 mg by mouth 2 (two) times daily.    Historical Provider, MD  metoprolol (LOPRESSOR) 50 MG tablet Take 1 tablet (50 mg total) by mouth 2 (two) times daily. 08/25/14   Janece Canterbury, MD  mirtazapine (REMERON) 7.5 MG tablet Take 7.5 mg by mouth at bedtime.    Historical Provider, MD  pantoprazole (PROTONIX) 40 MG tablet Take 40 mg by mouth daily.    Historical Provider, MD   BP 108/64 mmHg  Pulse 96  Temp(Src) 98.3 F (36.8 C) (Oral)  Resp 35  SpO2 100% Physical Exam  Constitutional: She is oriented to person, place, and time. She appears well-developed and well-nourished.  HENT:  Head: Normocephalic and atraumatic.  Pt has a palpable nodule  - left neck.  Eyes: EOM are normal. Pupils are equal, round, and reactive to light.  Neck: Neck supple. JVD present.  Cardiovascular: Normal rate and regular rhythm.   Murmur heard. Pulmonary/Chest: Effort normal. No respiratory distress.  Abdominal: Soft. She exhibits no distension. There is no tenderness. There is no rebound and no guarding.  Musculoskeletal:  She exhibits no edema or tenderness.  Neurological: She is alert and oriented to person, place, and time.  Skin: Skin is warm and dry.  Nursing note and vitals reviewed.   ED Course  Procedures (including critical care time) Labs Review Labs Reviewed  CBC WITH DIFFERENTIAL/PLATELET - Abnormal; Notable for the following:    RBC 3.65 (*)    Hemoglobin 10.1 (*)    HCT 30.9 (*)    RDW 21.1 (*)    Neutrophils Relative % 78 (*)    Lymphocytes Relative 10 (*)    All other components within normal limits  BASIC METABOLIC PANEL - Abnormal; Notable for the following:  Glucose, Bld 122 (*)    BUN 72 (*)    Creatinine, Ser 2.73 (*)    GFR calc non Af Amer 14 (*)    GFR calc Af Amer 17 (*)    All other components within normal limits  TROPONIN I - Abnormal; Notable for the following:    Troponin I 0.06 (*)    All other components within normal limits  URINALYSIS, ROUTINE W REFLEX MICROSCOPIC - Abnormal; Notable for the following:    Ketones, ur 15 (*)    Nitrite POSITIVE (*)    Leukocytes, UA SMALL (*)    All other components within normal limits  URINE MICROSCOPIC-ADD ON - Abnormal; Notable for the following:    Squamous Epithelial / LPF FEW (*)    Bacteria, UA MANY (*)    Casts HYALINE CASTS (*)    All other components within normal limits  BRAIN NATRIURETIC PEPTIDE    Imaging Review Dg Chest 2 View  12/08/2014   CLINICAL DATA:  Mid chest pain, cough, dyspnea  EXAM: CHEST  2 VIEW  COMPARISON:  08/24/2014  FINDINGS: Cardiomegaly with pulmonary vascular congestion and possible mild interstitial edema.  Suspected moderate left and small right pleural effusions. Associated lower lobe opacities, likely atelectasis.  Right subclavian pacemaker.  Right shoulder arthroplasty. Chronic degenerative changes with deformity of the left shoulder.  IMPRESSION: Cardiomegaly with possible mild interstitial edema.  Suspected moderate left and small right pleural effusions. Associated lower lobe  opacities, likely atelectasis.   Electronically Signed   By: Julian Hy M.D.   On: 12/10/2014 14:37     EKG Interpretation   Date/Time:  Friday December 09 2014 12:19:26 EST Ventricular Rate:  96 PR Interval:    QRS Duration: 141 QT Interval:  428 QTC Calculation: 541 R Axis:   -144 Text Interpretation:  Atrial fibrillation Nonspecific intraventricular  conduction delay Anterior infarct, old No significant change was found  Confirmed by Kathrynn Humble, MD, Thelma Comp [phone]) on 12/17/2014 12:27:42 PM      MDM   Final diagnoses:  Dyspnea  Nodule of neck  AKI (acute kidney injury)  UTI (lower urinary tract infection)  Pleural effusion  Chronic systolic congestive heart failure    Pt brought in from the SNF with cc of CHF evaluation. She has hx of CHF, tachycardia syndrome hx. Currently, she is not tachycardic, tachypneic, lungs are clear, legs show no pitting edema. There is certainly a L sided lung nodule palpated. We will get swallow evaluation, but she really doesn't appear to be having musculoskeletal problem with swallowing - if there is any difficulty, it will be mechanical. RN reports intermittent tachypnea - which spontaneously resolved. Lungs are clear there is no stridor. CXR and ACS labs ordered.    Varney Biles, MD 12/07/2014 1403  3:14 PM Pt has AKI, appears pre-renal also has UTI. With her advanced CHF, will need to be admitted for proper hydration. Ceftriaxone ordered. Trop is also slightly elevated-  Likely demand type. No chest pain currently.  Varney Biles, MD 12/08/2014 1515

## 2014-12-09 NOTE — ED Notes (Signed)
Pt from [facility] via [place] with c/o "I can't swallow.  I can't breathe.  I have a knot in my throat."  Staff also reports they are concerned about her having a CHF exacerbation.  Pt in NAD on arrival to ED.

## 2014-12-09 NOTE — ED Notes (Addendum)
When asking if pt had any pain, pt now reports chest pain that "feels like knot in it."

## 2014-12-10 ENCOUNTER — Encounter (HOSPITAL_COMMUNITY): Payer: Self-pay | Admitting: Radiology

## 2014-12-10 ENCOUNTER — Inpatient Hospital Stay (HOSPITAL_COMMUNITY): Payer: Medicare Other

## 2014-12-10 DIAGNOSIS — E43 Unspecified severe protein-calorie malnutrition: Secondary | ICD-10-CM | POA: Insufficient documentation

## 2014-12-10 LAB — COMPREHENSIVE METABOLIC PANEL
ALT: 13 U/L (ref 0–35)
AST: 22 U/L (ref 0–37)
Albumin: 3 g/dL — ABNORMAL LOW (ref 3.5–5.2)
Alkaline Phosphatase: 93 U/L (ref 39–117)
Anion gap: 13 (ref 5–15)
BUN: 66 mg/dL — AB (ref 6–23)
CALCIUM: 7.8 mg/dL — AB (ref 8.4–10.5)
CO2: 20 mmol/L (ref 19–32)
CREATININE: 2.32 mg/dL — AB (ref 0.50–1.10)
Chloride: 107 mmol/L (ref 96–112)
GFR calc Af Amer: 20 mL/min — ABNORMAL LOW (ref >90)
GFR calc non Af Amer: 18 mL/min — ABNORMAL LOW (ref >90)
Glucose, Bld: 95 mg/dL (ref 70–99)
Potassium: 3.5 mmol/L (ref 3.5–5.1)
Sodium: 140 mmol/L (ref 135–145)
TOTAL PROTEIN: 5.4 g/dL — AB (ref 6.0–8.3)
Total Bilirubin: 0.6 mg/dL (ref 0.3–1.2)

## 2014-12-10 LAB — TROPONIN I
TROPONIN I: 0.06 ng/mL — AB (ref <0.031)
Troponin I: 0.05 ng/mL — ABNORMAL HIGH (ref <0.031)

## 2014-12-10 LAB — GLUCOSE, CAPILLARY
GLUCOSE-CAPILLARY: 112 mg/dL — AB (ref 70–99)
GLUCOSE-CAPILLARY: 84 mg/dL (ref 70–99)
GLUCOSE-CAPILLARY: 97 mg/dL (ref 70–99)
Glucose-Capillary: 79 mg/dL (ref 70–99)
Glucose-Capillary: 88 mg/dL (ref 70–99)
Glucose-Capillary: 98 mg/dL (ref 70–99)

## 2014-12-10 LAB — CBC
HCT: 31.1 % — ABNORMAL LOW (ref 36.0–46.0)
Hemoglobin: 9.9 g/dL — ABNORMAL LOW (ref 12.0–15.0)
MCH: 27.4 pg (ref 26.0–34.0)
MCHC: 31.8 g/dL (ref 30.0–36.0)
MCV: 86.1 fL (ref 78.0–100.0)
Platelets: 190 10*3/uL (ref 150–400)
RBC: 3.61 MIL/uL — AB (ref 3.87–5.11)
RDW: 21.2 % — ABNORMAL HIGH (ref 11.5–15.5)
WBC: 7.8 10*3/uL (ref 4.0–10.5)

## 2014-12-10 LAB — T4, FREE: Free T4: 1.41 ng/dL (ref 0.80–1.80)

## 2014-12-10 MED ORDER — DEXTROSE-NACL 5-0.9 % IV SOLN
INTRAVENOUS | Status: DC
  Administered 2014-12-10: 15:00:00 via INTRAVENOUS

## 2014-12-10 NOTE — Evaluation (Signed)
Physical Therapy Evaluation Patient Details Name: Sophia Oliver MRN: [id number] DOB: 1924/03/09 Today's Date: 12/10/2014   History of Present Illness  81 yo female long term resident seen today for above. Nursing reports approx 10 lb weight gain in the last month. Lasix and aldactone stopped last month due to AKI (Cr 2.06 on 10/26/14). Last CXR 12/31st showed cardiomegaly, pulmonary edema but no pneumonia. Pt c/o worsening SOB x 1 day with palpitations, chills and swollen neck glands. Her roommate was dx with pneumonia last week. She denies CP, fever, sore throat or leg swelling. No HA or dizziness.  Clinical Impression  Patient demonstrates deficits in functional mobility as indicated below. Will need continued skilled PT to address deficits and maximize function. Will see as indicated and progress as tolerated.    Follow Up Recommendations SNF;Supervision/Assistance - 24 hour    Equipment Recommendations  Other (comment)    Recommendations for Other Services       Precautions / Restrictions Precautions Precautions: Fall Restrictions Weight Bearing Restrictions: No      Mobility  Bed Mobility Overal bed mobility: Needs Assistance Bed Mobility: Rolling;Supine to Sit Rolling: Mod assist   Supine to sit: Mod assist     General bed mobility comments: patient with pain when coming to upright, limited ROm and catches in trunk while elevating, patient with limited ROM in UEs inhibiting mobility  Transfers Overall transfer level: Needs assistance Equipment used: Rolling walker (2 wheeled) Transfers: Sit to/from Stand Sit to Stand: Min assist         General transfer comment: VCs for safety with hand placement  Ambulation/Gait Ambulation/Gait assistance: Min assist Ambulation Distance (Feet): 10 Feet Assistive device: Rolling walker (2 wheeled) Gait Pattern/deviations: Step-to pattern;Decreased stride length;Trunk flexed;Narrow base of support     General Gait Details:  very unsteady with ambulation, min assist to maintain balance  Stairs            Wheelchair Mobility    Modified Rankin (Stroke Patients Only)       Balance Overall balance assessment: History of Falls                                           Pertinent Vitals/Pain Pain Assessment: Faces (history of OA and back/hip pain) Faces Pain Scale: Hurts even more Pain Descriptors / Indicators: Grimacing Pain Intervention(s): Limited activity within patient's tolerance;Monitored during session;Repositioned    Home Living Family/patient expects to be discharged to:: Skilled nursing facility                      Prior Function Level of Independence: Needs assistance   Gait / Transfers Assistance Needed: has been ambulating with AD but has increased falls, needs assistance,      Comments: at SNF prior to admission for rehab however was scheduled to d/c home per pt, pt would like to d/c home from hospital     Hand Dominance        Extremity/Trunk Assessment   Upper Extremity Assessment: Generalized weakness (RUE ROM limitations at baseline)           Lower Extremity Assessment: Generalized weakness (history of hips surgeries, pain and OA)         Communication   Communication: No difficulties  Cognition Arousal/Alertness: Awake/alert Behavior During Therapy: Flat affect Overall Cognitive Status: Within Functional Limits for tasks assessed  General Comments      Exercises        Assessment/Plan    PT Assessment Patient needs continued PT services  PT Diagnosis Difficulty walking;Abnormality of gait;Generalized weakness;Acute pain   PT Problem List Decreased strength;Decreased range of motion;Decreased activity tolerance;Decreased balance;Decreased mobility;Decreased coordination;Decreased safety awareness  PT Treatment Interventions DME instruction;Gait training;Functional mobility  training;Therapeutic activities;Therapeutic exercise;Balance training;Patient/family education   PT Goals (Current goals can be found in the Care Plan section) Acute Rehab PT Goals Patient Stated Goal: to get back to rehab PT Goal Formulation: With patient/family Time For Goal Achievement: 12/24/14 Potential to Achieve Goals: Fair    Frequency Min 3X/week   Barriers to discharge        Co-evaluation               End of Session Equipment Utilized During Treatment: Gait belt;Oxygen Activity Tolerance: Patient limited by fatigue Patient left: in chair;with call bell/phone within reach;with family/visitor present Nurse Communication: Mobility status         Time: 6122-4497 PT Time Calculation (min) (ACUTE ONLY): 22 min   Charges:   PT Evaluation $Initial PT Evaluation Tier I: 1 Procedure     PT G CodesDuncan Dull 01/04/15, 3:39 PM Alben Deeds, [place] DPT  [phone]

## 2014-12-10 NOTE — Progress Notes (Signed)
[facility] PROGRESS NOTE  Sophia Oliver TWS:[id number] DOB: 11-26-1923 DOA: 12/14/2014 PCP: [name], MD  Assessment/Plan: 1. Acute on chronic stage 3 CKD 1. Suspect secondary to poor po intake with concurrent diuretic use 2. Cont with gentle IVF and monitor i/o and daily wts 3. Holding diuretics 4. Renal function slightly improved 2. PAF 1. Noted on presenting EKG 2. CHADS of at least 3. Unclear why pt is not on anticoagulation. She is, however, closely followed by Cardiology, last seen by Dr. Caryl Comes on 11/22/14 3. Currently rate controlled 4. Monitor for now 3. Combined chronic and diastolic chf 1. Clinically dehydrated 2. Gentle IVF per above 3. Monitor i/o and daily wts 4. DM2 1. Cont on SSI coverage 2. Glucose remains stable 5. UTI 1. UA suggestive of UTI 2. Follow up on urine culture 3. Cont on empiric rocephin 4. No leukocytosis 6. Severe protein calorie malnutrition 1. Consulted nutrition 2. Appreciate recs 7. DVT prophylaxis 1. Heparin subQ 8. Multinodular goiter with resultant dysphagia 1. Neck mass noted on exam 2. Follow up US of neck demonstrates multinodular goiter with none of the nodules meeting imaging criteria to recommend percutaneous sampling 3. TSH and free T4 normal 4. SLP consulted with recs for NPO given concerns for aspiration 5. Have ordered CT neck to r/o esophageal obstruction 9. Weakness 1. Consult PT/OT  Code Status: DNR Family Communication: Pt in room, husband at bedside (indicate person spoken with, relationship, and if by phone, the number) Disposition Plan: Pending   Consultants:  none  Procedures:    Antibiotics:  Rocephin 2/5>>> (indicate start date, and stop date if known)  HPI/Subjective: Feels better today. Wants to drink  Objective: Filed Vitals:   12/10/14 0039 12/10/14 0430 12/10/14 1000 12/10/14 1400  BP: 110/56 114/59 100/60 97/65  Pulse: 92 98 88 93  Temp:  98.3 F (36.8 C)  97.7 F (36.5 C)   TempSrc:  Oral  Oral  Resp: 18 20 18 18   Height:      Weight:  46.3 kg (102 lb 1.2 oz)    SpO2: 100% 100% 98% 100%    Intake/Output Summary (Last 24 hours) at 12/10/14 1746 Last data filed at 12/10/14 1600  Gross per 24 hour  Intake 373.33 ml  Output      0 ml  Net 373.33 ml   Filed Weights   12/23/2014 1654 12/10/14 0430  Weight: 47 kg (103 lb 9.9 oz) 46.3 kg (102 lb 1.2 oz)    Exam:   General:  Awake, in nad  Cardiovascular: regular, s1, s2  Respiratory: normal resp effort, no wheezing  Abdomen: soft,nondistended  Musculoskeletal: perfused, no clubbing   Data Reviewed: Basic Metabolic Panel:  Recent Labs Lab 12/05/14 0510 12/23/2014 1322 12/10/14 0722  NA 143 139 140  K 3.9 3.7 3.5  CL 109 104 107  CO2 23 22 20   GLUCOSE 85 122* 95  BUN 46* 72* 66*  CREATININE 1.61* 2.73* 2.32*  CALCIUM 8.6 8.4 7.8*   Liver Function Tests:  Recent Labs Lab 12/10/14 0722  AST 22  ALT 13  ALKPHOS 93  BILITOT 0.6  PROT 5.4*  ALBUMIN 3.0*   No results for input(s): LIPASE, AMYLASE in the last 168 hours. No results for input(s): AMMONIA in the last 168 hours. CBC:  Recent Labs Lab 12/16/2014 1322 12/10/14 0722  WBC 9.7 7.8  NEUTROABS 7.0  --   HGB 10.1* 9.9*  HCT 30.9* 31.1*  MCV 84.7 86.1  PLT 210 190  Cardiac Enzymes:  Recent Labs Lab 12/22/2014 1322 12/12/2014 1900 12/10/14 0056 12/10/14 0722  TROPONINI 0.06* 0.05* 0.06* 0.05*   BNP (last 3 results)  Recent Labs  12/05/2014 1322  BNP 1510.0*    ProBNP (last 3 results)  Recent Labs  06/24/14 1113 08/21/14 1655 08/21/14 2139  PROBNP 16090.0* 37601.0* 42122.0*    CBG:  Recent Labs Lab 12/10/14 0034 12/10/14 0535 12/10/14 0608 12/10/14 1303 12/10/14 1632  GLUCAP 112* 79 84 97 88    Recent Results (from the past 240 hour(s))  MRSA PCR Screening     Status: Abnormal   Collection Time: 12/18/2014  5:15 PM  Result Value Ref Range Status   MRSA by PCR POSITIVE (A) NEGATIVE Final     Comment:        The GeneXpert MRSA Assay (FDA approved for NASAL specimens only), is one component of a comprehensive MRSA colonization surveillance program. It is not intended to diagnose MRSA infection nor to guide or monitor treatment for MRSA infections. RESULT CALLED TO, READ BACK BY AND VERIFIED WITH: CALLED TO RN DALLAS HART [phone] @2137  THANEY      Studies: Dg Chest 2 View  12/20/2014   CLINICAL DATA:  Mid chest pain, cough, dyspnea  EXAM: CHEST  2 VIEW  COMPARISON:  08/24/2014  FINDINGS: Cardiomegaly with pulmonary vascular congestion and possible mild interstitial edema.  Suspected moderate left and small right pleural effusions. Associated lower lobe opacities, likely atelectasis.  Right subclavian pacemaker.  Right shoulder arthroplasty. Chronic degenerative changes with deformity of the left shoulder.  IMPRESSION: Cardiomegaly with possible mild interstitial edema.  Suspected moderate left and small right pleural effusions. Associated lower lobe opacities, likely atelectasis.   Electronically Signed   By: Julian Hy M.D.   On: 12/16/2014 14:37   US Soft Tissue Head/neck  12/28/2014   CLINICAL DATA:  Nodule within the neck, to the left of midline.  EXAM: THYROID ULTRASOUND  TECHNIQUE: Ultrasound examination of the thyroid gland and adjacent soft tissues was performed.  COMPARISON:  None.  FINDINGS: Right thyroid lobe  Measurements: Diminutive measuring 2.7 x 1.0 x 0.9 cm.  Right, superior - 0.4 x 0.4 x 0.5 cm-anechoic, likely cystic.  Right, mid, - 0.5 x 0.5 x 0.9 cm - mixed echogenic, solid.  Right, inferior, lateral - 0.5 x 0.5 x 0.5 cm - anechoic with peripheral echogenic nodule with ring down artifact suggestive of colloid.  Left thyroid lobe  Measurements: Normal in size measuring 5.4 x 2.4 x 2.1 cm.  Left, mid, posterior - 0.9 x 1.0 x 1.0 cm - mixed echogenic, solid.  Left, inferior - 1.0 x 1.3 x 0.9 cm-mixed echogenic, solid  There are multiple additional sub 5 mm mixed  echogenic nodules scattered throughout the remainder of the left lobe of the thyroid.  Isthmus  Thickness: Normal in size measuring 0.3 cm in diameter.  No discrete nodules identified within the thyroid isthmus.  Lymphadenopathy  None visualized.  IMPRESSION: Findings suggestive of multi nodular goiter, asymmetrically involving the left lobe of the thyroid. None of the discretely measured thyroid nodules currently meet imaging criteria to recommend percutaneous sampling.  This recommendation follows the consensus statement: Management of Thyroid Nodules Detected at [place]: Society of Radiologists in [place]. Radiology 2005; [id number]. None   Electronically Signed   By: Sandi Mariscal M.D.   On: 12/29/2014 15:44    Scheduled Meds: . Chlorhexidine Gluconate Cloth  6 each Topical Q0600  . heparin  5,000 Units  Subcutaneous 3 times per day  . insulin aspart  0-15 Units Subcutaneous 6 times per day  . metoprolol  2.5 mg Intravenous 4 times per day  . mupirocin ointment  1 application Nasal BID  . sodium chloride  3 mL Intravenous Q12H   Continuous Infusions: . dextrose 5 % and 0.9% NaCl 50 mL/hr at 12/10/14 1455    Principal Problem:   ARF (acute renal failure) Active Problems:   CKD (chronic kidney disease), stage III   Pacemaker   PAF (paroxysmal atrial fibrillation)   Malnutrition of moderate degree   Acute on chronic combined systolic and diastolic CHF (congestive heart failure)   DM (diabetes mellitus) type II controlled with renal manifestation   Paroxysmal atrial fibrillation   AKI (acute kidney injury)   Protein-calorie malnutrition, severe    CHIU, STEPHEN K  [facility] Pager [phone]. If 7PM-7AM, please contact night-coverage at [email or web address], password [facility] 12/10/2014, 5:46 PM  LOS: 1 day

## 2014-12-10 NOTE — Evaluation (Signed)
Clinical/Bedside Swallow Evaluation Patient Details  Name: Sophia Oliver MRN: [id number] Date of Birth: [birth year]  Today's Date: 12/10/2014 Time: SLP Start Time (ACUTE ONLY): 6010 SLP Stop Time (ACUTE ONLY): 0902 SLP Time Calculation (min) (ACUTE ONLY): 29 min  Past Medical History:  Past Medical History  Diagnosis Date  . Syncope   . Sinus node dysfunction     a. s/p pacemaker.  . Wide-complex tachycardia   . Cholelithiasis   . Glucose intolerance (impaired glucose tolerance)   . Anxiety   . PONV (postoperative nausea and vomiting)   . Chronic kidney disease     a. Baseline Cr reported 1.8-2.0.  . Injury of right upper arm     after venipuncture in 3/13 - pt describes pain afterwards- no followup done seen by PCP- no further treatment   . PAF (paroxysmal atrial fibrillation)   . Left bundle branch block   . Tachycardia-bradycardia syndrome   . CHF (congestive heart failure)     a. EF 30-35% by echo 2015.  [name] Pacemaker     Guidant, implanted 2006  . Type II diabetes mellitus     "recently dx'd" (03/09/2014)  . Breast cancer 2003    "radiation on right; mastectomy on the left"  . Hypertension   . GERD (gastroesophageal reflux disease)   . DJD (degenerative joint disease)   . Arthritis     "real bad in my lower back" (03/09/2014)  . Chronic lower back pain   . Orthostatic hypotension     a. Diuretics d/c'd 03/2014.  [name] PAT (paroxysmal atrial tachycardia)    Past Surgical History:  Past Surgical History  Procedure Laterality Date  . Replacement total knee bilateral Bilateral   . Total shoulder replacement    . Tonsillectomy    . Orif distal femur fracture Right 12/2007  . Tonsillectomy    . Mastectomy Left 2003  . Total hip arthroplasty  06/16/2012    Procedure: TOTAL HIP ARTHROPLASTY;  Surgeon: Mauri Pole, MD;  Location: [facility];  Service: Orthopedics;  Laterality: Left;  . Hip closed reduction  06/30/2012    Procedure: CLOSED MANIPULATION HIP;  Surgeon: Mauri Pole,  MD;  Location: [facility];  Service: Orthopedics;  Laterality: Left;  . Abdominal hysterectomy    . Pacemaker insertion      Guidant Insignia I  . Cataract extraction w/ intraocular lens  implant, bilateral Bilateral 2000's  . Refractive surgery Bilateral 01/2014  . Inguinal hernia repair Left     "had emergency OR for it a few years ago" (03/09/2014)  . Joint replacement    . Breast biopsy Bilateral    HPI:  81 yo female adm to [facility] with complaint of dyphagia - diagnosed with ARF, Pt with h/o multinodular goiter, CHF, esophageal dysphagia, syncope, psh + for hip sx.  Pt has been at rehab since 2013 and reports her swallowing problems have been ongoing but have worsened predominantly in the last 2-3 weeks resulting in weight loss x 20-30 pounds in 3 months.  Esophagram 2015 showed dysmotility, silent + laryngeal penetration.  MBS in 2013 showed mild pharyngeal deficits.  Pt now reports significant pain with swallowing and sensation of stasis on left side of neck.  Swallow evaluation ordered.    Assessment / Plan / Recommendation Clinical Impression  Suspect acute on chronic multifactorial dysphagia with symptoms of pharyngeal/probable cervical esophageal deficits due to goiter.  ? worsening impact of swallowing from goiter and/or deconditioning.  CN exam negative but pt  generally weak and reports her heart failure is worse which likely results in deconditioning further impairing swallowing.  Pt presents with multiple swallows across consistencies, effortful swallow, throat clearing and odynophagia.  Head turn left not helpful, chin tuck not tested due to discomfort.  Pt did become dyspneic after swallow of small bite of applesauce with deep inhalation and rapid RR - increasing aspiration risk.   Recommend to consider MBS to allow instrumental swallow eval or NPO and consider follow up with ENT/surgery.  Concern for chronicity of dysphagia present.  Pt is high aspiration/malnutrition risk due to her  dysphagia.    SLP spoke to Dr Wyline Copas and pt to remain npo except allow pt to swish and expectorate water.  Reviewed previous tests and current recommendations with pt.      Aspiration Risk  Moderate    Diet Recommendation NPO (swish and expectorate with water)   Medication Administration: Crushed with puree    Other  Recommendations Recommended Consults: MBS   Follow Up Recommendations    TBD   Frequency and Duration min 1 x/week  2 weeks   Pertinent Vitals/Pain Afebrile, decreased     Swallow Study Prior Functional Status   see [place] Date of Onset: 12/10/14 HPI: 81 yo female adm to [facility] with complaint of dyphagia - diagnosed with ARF, Pt with h/o multinodular goiter, CHF, esophageal dysphagia, syncope, psh + for hip sx.  Pt has been at rehab since 2013 and reports her swallowing problems have been ongoing but have worsened predominantly in the last 2-3 weeks resulting in weight loss x 20-30 pounds in 3 months.  Esophagram 2015 showed dysmotility, silent + laryngeal penetration.  MBS in 2013 showed mild pharyngeal deficits.  Pt now reports significant pain with swallowing and sensation of stasis on left side of neck.  Swallow evaluation ordered.  Type of Study: Bedside swallow evaluation Diet Prior to this Study: NPO Temperature Spikes Noted: No Respiratory Status: Nasal cannula History of Recent Intubation: No Behavior/Cognition: Alert;Cooperative;Pleasant mood Oral Cavity - Dentition: Adequate natural dentition Self-Feeding Abilities: Able to feed self Patient Positioning: Upright in bed Baseline Vocal Quality: Low vocal intensity;Hoarse Volitional Cough: Strong Volitional Swallow: Able to elicit    Oral/Motor/Sensory Function Overall Oral Motor/Sensory Function: Appears within functional limits for tasks assessed   Ice Chips Ice chips: Not tested   Thin Liquid Thin Liquid: Impaired Presentation: Straw;Self Fed Pharyngeal  Phase Impairments: Multiple  swallows;Throat Clearing - Immediate Other Comments: belching    Nectar Thick Nectar Thick Liquid: Not tested   Honey Thick Honey Thick Liquid: Not tested   Puree Puree: Impaired Presentation: Self Fed;Spoon Pharyngeal Phase Impairments: Multiple swallows;Throat Clearing - Immediate Other Comments: belching   Solid   GO    Solid: Not tested       Luanna Salk, [facility] SLP [phone]

## 2014-12-10 NOTE — Progress Notes (Signed)
INITIAL NUTRITION ASSESSMENT  Pt meets criteria for SEVERE MALNUTRITION in the context of chronic illness as evidenced by a 9.7% weight loss in 1 month, energy intake </=75% for >/= 1 month, and severe fat and muscle mass loss.  DOCUMENTATION CODES Per approved criteria  -Severe malnutrition in the context of chronic illness -Underweight   INTERVENTION: Diet advancement per MD.  Will continue to monitor.  NUTRITION DIAGNOSIS: Inadequate oral intake related to decreased appetite, dysphagia as evidenced by pt report.   Goal: Pt to meet >/= 90% of their estimated nutrition needs   Monitor:  Diet advancement, weight trends, labs, I/O's  Reason for Assessment: MD consult for assessment of nutrition requirements/status  81 y.o. female  Admitting Dx: ARF (acute renal failure)  ASSESSMENT: With a hx of chronic combined systolic-diastolic chf with EF around 30%, dm, sinus node dysfunction s/p pacemaker placement who presents from facility with concerns of sob and dysphagia.  Pt reports having a lack of appetite which associated difficulties swallowing which has been ongoing over the past 2-3 months. She reports he has only been eating 1 meal a day. Usual body weight is reported to be 103 lbs, however per Epic weight records, pt with a 9.7% weight loss in 1 month. Pt is currently NPO. Speech is following. Will continue to monitor.  Nutrition Focused Physical Exam:  Subcutaneous Fat:  Orbital Region: N/A Upper Arm Region: Severe depletion Thoracic and Lumbar Region: Severe depletion  Muscle:  Temple Region: N/A Clavicle Bone Region: Severe depletion Clavicle and Acromion Bone Region: Severe depletion Scapular Bone Region: N/A Dorsal Hand: Severe depletion Patellar Region: Severe depletion Anterior Thigh Region: Severe depletion Posterior Calf Region: Severe depletion  Edema: none  Labs: Low calcium and GFR. High BUN and creatinine.  Height: Ht Readings from Last 1  Encounters:  12/19/2014 5\' 4"  (1.626 m)    Weight: Wt Readings from Last 1 Encounters:  12/10/14 102 lb 1.2 oz (46.3 kg)    Ideal Body Weight: 120 lbs  % Ideal Body Weight: 85%  Wt Readings from Last 10 Encounters:  12/10/14 102 lb 1.2 oz (46.3 kg)  11/22/14 113 lb (51.256 kg)  10/20/14 99 lb (44.906 kg)  10/18/14 96 lb (43.545 kg)  10/12/14 97 lb (43.999 kg)  10/07/14 98 lb (44.453 kg)  10/04/14 99 lb 3.2 oz (44.997 kg)  09/13/14 104 lb 6.4 oz (47.356 kg)  08/30/14 105 lb (47.628 kg)  08/25/14 103 lb 2.8 oz (46.8 kg)    Usual Body Weight: 103 lbs per pt report  % Usual Body Weight: 99%  BMI:  Body mass index is 17.51 kg/(m^2). Underweight  Estimated Nutritional Needs: Kcal: 1600-1800 Protein: 65-75 grams Fluid: Per MD  Skin: Intact  Diet Order:  NPO  EDUCATION NEEDS: -No education needs identified at this time   Intake/Output Summary (Last 24 hours) at 12/10/14 0821 Last data filed at 12/10/14 0204  Gross per 24 hour  Intake 373.33 ml  Output      0 ml  Net 373.33 ml    Last BM: 2/5  Labs:   Recent Labs Lab 12/05/14 0510 12/05/2014 1322  NA 143 139  K 3.9 3.7  CL 109 104  CO2 23 22  BUN 46* 72*  CREATININE 1.61* 2.73*  CALCIUM 8.6 8.4  GLUCOSE 85 122*    CBG (last 3)   Recent Labs  12/10/14 0034 12/10/14 0535 12/10/14 0608  GLUCAP 112* 79 84    Scheduled Meds: . Chlorhexidine Gluconate Cloth  6 each Topical Q0600  . heparin  5,000 Units Subcutaneous 3 times per day  . insulin aspart  0-15 Units Subcutaneous 6 times per day  . lactated ringers   Intravenous STAT  . metoprolol  2.5 mg Intravenous 4 times per day  . mupirocin ointment  1 application Nasal BID  . sodium chloride  3 mL Intravenous Q12H    Continuous Infusions: . sodium chloride 50 mL/hr at 12/20/2014 1836    Past Medical History  Diagnosis Date  . Syncope   . Sinus node dysfunction     a. s/p pacemaker.  . Wide-complex tachycardia   . Cholelithiasis   .  Glucose intolerance (impaired glucose tolerance)   . Anxiety   . PONV (postoperative nausea and vomiting)   . Chronic kidney disease     a. Baseline Cr reported 1.8-2.0.  . Injury of right upper arm     after venipuncture in 3/13 - pt describes pain afterwards- no followup done seen by PCP- no further treatment   . PAF (paroxysmal atrial fibrillation)   . Left bundle branch block   . Tachycardia-bradycardia syndrome   . CHF (congestive heart failure)     a. EF 30-35% by echo 2015.  [name] Pacemaker     Guidant, implanted 2006  . Type II diabetes mellitus     "recently dx'd" (03/09/2014)  . Breast cancer 2003    "radiation on right; mastectomy on the left"  . Hypertension   . GERD (gastroesophageal reflux disease)   . DJD (degenerative joint disease)   . Arthritis     "real bad in my lower back" (03/09/2014)  . Chronic lower back pain   . Orthostatic hypotension     a. Diuretics d/c'd 03/2014.  [name] PAT (paroxysmal atrial tachycardia)     Past Surgical History  Procedure Laterality Date  . Replacement total knee bilateral Bilateral   . Total shoulder replacement    . Tonsillectomy    . Orif distal femur fracture Right 12/2007  . Tonsillectomy    . Mastectomy Left 2003  . Total hip arthroplasty  06/16/2012    Procedure: TOTAL HIP ARTHROPLASTY;  Surgeon: Mauri Pole, MD;  Location: [facility];  Service: Orthopedics;  Laterality: Left;  . Hip closed reduction  06/30/2012    Procedure: CLOSED MANIPULATION HIP;  Surgeon: Mauri Pole, MD;  Location: [facility];  Service: Orthopedics;  Laterality: Left;  . Abdominal hysterectomy    . Pacemaker insertion      Guidant Insignia I  . Cataract extraction w/ intraocular lens  implant, bilateral Bilateral 2000's  . Refractive surgery Bilateral 01/2014  . Inguinal hernia repair Left     "had emergency OR for it a few years ago" (03/09/2014)  . Joint replacement    . Breast biopsy Bilateral     Kallie Locks, MS, RD, LDN Pager # [phone] After hours/  weekend pager # [phone]

## 2014-12-11 ENCOUNTER — Inpatient Hospital Stay (HOSPITAL_COMMUNITY): Payer: Medicare Other

## 2014-12-11 DIAGNOSIS — I5043 Acute on chronic combined systolic (congestive) and diastolic (congestive) heart failure: Secondary | ICD-10-CM

## 2014-12-11 LAB — BASIC METABOLIC PANEL
Anion gap: 13 (ref 5–15)
BUN: 48 mg/dL — ABNORMAL HIGH (ref 6–23)
CO2: 19 mmol/L (ref 19–32)
Calcium: 8 mg/dL — ABNORMAL LOW (ref 8.4–10.5)
Chloride: 113 mmol/L — ABNORMAL HIGH (ref 96–112)
Creatinine, Ser: 1.74 mg/dL — ABNORMAL HIGH (ref 0.50–1.10)
GFR calc Af Amer: 29 mL/min — ABNORMAL LOW (ref >90)
GFR calc non Af Amer: 25 mL/min — ABNORMAL LOW (ref >90)
Glucose, Bld: 104 mg/dL — ABNORMAL HIGH (ref 70–99)
POTASSIUM: 2.9 mmol/L — AB (ref 3.5–5.1)
SODIUM: 145 mmol/L (ref 135–145)

## 2014-12-11 LAB — GLUCOSE, CAPILLARY
GLUCOSE-CAPILLARY: 102 mg/dL — AB (ref 70–99)
GLUCOSE-CAPILLARY: 166 mg/dL — AB (ref 70–99)
GLUCOSE-CAPILLARY: 143 mg/dL — AB (ref 70–99)
Glucose-Capillary: 111 mg/dL — ABNORMAL HIGH (ref 70–99)
Glucose-Capillary: 129 mg/dL — ABNORMAL HIGH (ref 70–99)
Glucose-Capillary: 90 mg/dL (ref 70–99)
Glucose-Capillary: 99 mg/dL (ref 70–99)

## 2014-12-11 LAB — MAGNESIUM: Magnesium: 1.9 mg/dL (ref 1.5–2.5)

## 2014-12-11 MED ORDER — POTASSIUM CHLORIDE 10 MEQ/100ML IV SOLN: 10.0000 meq | INTRAVENOUS | Status: DC

## 2014-12-11 MED ORDER — KCL IN DEXTROSE-NACL 40-5-0.9 MEQ/L-%-% IV SOLN
INTRAVENOUS | Status: DC
  Administered 2014-12-11 – 2014-12-12 (×3): via INTRAVENOUS
  Filled 2014-12-11 (×5): qty 1000

## 2014-12-11 MED ORDER — CETYLPYRIDINIUM CHLORIDE 0.05 % MT LIQD
7.0000 mL | Freq: Two times a day (BID) | OROMUCOSAL | Status: DC
  Administered 2014-12-12 – 2014-12-13 (×4): 7 mL via OROMUCOSAL

## 2014-12-11 MED ORDER — CHLORHEXIDINE GLUCONATE 0.12 % MT SOLN
15.0000 mL | Freq: Two times a day (BID) | OROMUCOSAL | Status: DC
  Administered 2014-12-11 – 2014-12-13 (×4): 15 mL via OROMUCOSAL
  Filled 2014-12-11 (×6): qty 15

## 2014-12-11 NOTE — Procedures (Cosign Needed)
Objective Swallowing Evaluation: Modified Barium Swallowing Study  Patient Details  Name: Sophia Oliver MRN: [id number] Date of Birth: 1924/08/27  Today's Date: 12/11/2014 Time: SLP Start Time (ACUTE ONLY): 1150-SLP Stop Time (ACUTE ONLY): 1214 SLP Time Calculation (min) (ACUTE ONLY): 24 min  Past Medical History:  Past Medical History  Diagnosis Date  . Syncope   . Sinus node dysfunction     a. s/p pacemaker.  . Wide-complex tachycardia   . Cholelithiasis   . Glucose intolerance (impaired glucose tolerance)   . Anxiety   . PONV (postoperative nausea and vomiting)   . Chronic kidney disease     a. Baseline Cr reported 1.8-2.0.  . Injury of right upper arm     after venipuncture in 3/13 - pt describes pain afterwards- no followup done seen by PCP- no further treatment   . PAF (paroxysmal atrial fibrillation)   . Left bundle branch block   . Tachycardia-bradycardia syndrome   . CHF (congestive heart failure)     a. EF 30-35% by echo 2015.  [name] Pacemaker     Guidant, implanted 2006  . Type II diabetes mellitus     "recently dx'd" (03/09/2014)  . Breast cancer 2003    "radiation on right; mastectomy on the left"  . Hypertension   . GERD (gastroesophageal reflux disease)   . DJD (degenerative joint disease)   . Arthritis     "real bad in my lower back" (03/09/2014)  . Chronic lower back pain   . Orthostatic hypotension     a. Diuretics d/c'd 03/2014.  [name] PAT (paroxysmal atrial tachycardia)    Past Surgical History:  Past Surgical History  Procedure Laterality Date  . Replacement total knee bilateral Bilateral   . Total shoulder replacement    . Tonsillectomy    . Orif distal femur fracture Right 12/2007  . Tonsillectomy    . Mastectomy Left 2003  . Total hip arthroplasty  06/16/2012    Procedure: TOTAL HIP ARTHROPLASTY;  Surgeon: Mauri Pole, MD;  Location: [facility];  Service: Orthopedics;  Laterality: Left;  . Hip closed reduction  06/30/2012    Procedure: CLOSED MANIPULATION  HIP;  Surgeon: Mauri Pole, MD;  Location: [facility];  Service: Orthopedics;  Laterality: Left;  . Abdominal hysterectomy    . Pacemaker insertion      Guidant Insignia I  . Cataract extraction w/ intraocular lens  implant, bilateral Bilateral 2000's  . Refractive surgery Bilateral 01/2014  . Inguinal hernia repair Left     "had emergency OR for it a few years ago" (03/09/2014)  . Joint replacement    . Breast biopsy Bilateral    HPI:  HPI: 81 yo female adm to [facility] with complaint of dyphagia - diagnosed with ARF, Pt with h/o multinodular goiter, CHF, esophageal dysphagia, syncope, psh + for hip sx.  Pt has been at rehab since 2013 and reports her swallowing problems have been ongoing but have worsened predominantly in the last 2-3 weeks resulting in weight loss x 20-30 pounds in 3 months.  Esophagram 2015 showed dysmotility, silent + laryngeal penetration.  MBS in 2013 showed mild pharyngeal deficits.  Pt now reports significant pain with swallowing and sensation of stasis on left side of neck.  Swallow evaluation ordered.   No Data Recorded  Assessment / Plan / Recommendation CHL IP CLINICAL IMPRESSIONS 12/11/2014  Dysphagia Diagnosis Mild pharyngeal phase dysphagia  Clinical impression Pt demonstrates mild motor sensory oropharyngeal deficits. Oral phase is adequate though  there is slight base of tongue weakness resulting in mild base of tongue/vallecular residuals post swallow. Swallow initaiton is delayed with trace silent penetration to the cords with 50% of thin boluses. A cued throat clear and second swallow appeared most effective though verbal cues to "sip and hold" as well as a chin tuck were both effective in reducing penetration events. Nectar thick liquids were also very well tolerated if pt appears to not be able to to follow recommendations with thin liquids. There was no evidence of esophageal stasis and no signs of anatomical impedance of bolus flow. Pt continues to report a feeling of  "paralysis" though hyolaryngeal mobility was very good. Recommend pt resume dys 3 (mechanical soft) diet with thin liquids with cues to clear throat and swallow again. SLP will f/u for further instruction and techniques as needed.       CHL IP TREATMENT RECOMMENDATION 12/11/2014  Treatment Plan Recommendations Therapy as outlined in treatment plan below     CHL IP DIET RECOMMENDATION 12/11/2014  Diet Recommendations Dysphagia 3 (Mechanical Soft);Thin liquid  Liquid Administration via Cup  Medication Administration Whole meds with puree  Compensations Slow rate;Small sips/bites;Clear throat intermittently;Multiple dry swallows after each bite/sip  Postural Changes and/or Swallow Maneuvers Seated upright 90 degrees;Out of bed for meals     CHL IP OTHER RECOMMENDATIONS 12/11/2014  Recommended Consults (None)  Oral Care Recommendations Oral care BID  Other Recommendations (None)     CHL IP FOLLOW UP RECOMMENDATIONS 12/11/2014  Follow up Recommendations Skilled Nursing facility     [facility] IP FREQUENCY AND DURATION 12/11/2014  Speech Therapy Frequency (ACUTE ONLY) min 2x/week  Treatment Duration 2 weeks     Pertinent Vitals/Pain NA    SLP Swallow Goals No flowsheet data found.  No flowsheet data found.    CHL IP REASON FOR REFERRAL 12/11/2014  Reason for Referral Objectively evaluate swallowing function     CHL IP ORAL PHASE 12/11/2014  Lips (None)  Tongue (None)  Mucous membranes (None)  Nutritional status (None)  Other (None)  Oxygen therapy (None)  Oral Phase WFL  Oral - Pudding Teaspoon (None)  Oral - Pudding Cup (None)  Oral - Honey Teaspoon (None)  Oral - Honey Cup (None)  Oral - Honey Syringe (None)  Oral - Nectar Teaspoon (None)  Oral - Nectar Cup (None)  Oral - Nectar Straw (None)  Oral - Nectar Syringe (None)  Oral - Ice Chips (None)  Oral - Thin Teaspoon (None)  Oral - Thin Cup (None)  Oral - Thin Straw (None)  Oral - Thin Syringe (None)  Oral - Puree (None)  Oral -  Mechanical Soft (None)  Oral - Regular (None)  Oral - Multi-consistency (None)  Oral - Pill (None)  Oral Phase - Comment (None)      CHL IP PHARYNGEAL PHASE 12/11/2014  Pharyngeal Phase Impaired  Pharyngeal - Pudding Teaspoon (None)  Penetration/Aspiration details (pudding teaspoon) (None)  Pharyngeal - Pudding Cup (None)  Penetration/Aspiration details (pudding cup) (None)  Pharyngeal - Honey Teaspoon (None)  Penetration/Aspiration details (honey teaspoon) (None)  Pharyngeal - Honey Cup (None)  Penetration/Aspiration details (honey cup) (None)  Pharyngeal - Honey Syringe (None)  Penetration/Aspiration details (honey syringe) (None)  Pharyngeal - Nectar Teaspoon (None)  Penetration/Aspiration details (nectar teaspoon) (None)  Pharyngeal - Nectar Cup Delayed swallow initiation;Pharyngeal residue - valleculae;Reduced tongue base retraction  Penetration/Aspiration details (nectar cup) (None)  Pharyngeal - Nectar Straw (None)  Penetration/Aspiration details (nectar straw) (None)  Pharyngeal - Nectar Syringe (None)  Penetration/Aspiration details (nectar syringe) (None)  Pharyngeal - Ice Chips (None)  Penetration/Aspiration details (ice chips) (None)  Pharyngeal - Thin Teaspoon (None)  Penetration/Aspiration details (thin teaspoon) (None)  Pharyngeal - Thin Cup Delayed swallow initiation;Reduced tongue base retraction;Penetration/Aspiration before swallow;Pharyngeal residue - valleculae  Penetration/Aspiration details (thin cup) Material enters airway, CONTACTS cords and not ejected out;Material enters airway, remains ABOVE vocal cords and not ejected out;Material does not enter airway  Pharyngeal - Thin Straw (None)  Penetration/Aspiration details (thin straw) (None)  Pharyngeal - Thin Syringe (None)  Penetration/Aspiration details (thin syringe') (None)  Pharyngeal - Puree Delayed swallow initiation;Reduced tongue base retraction;Pharyngeal residue - valleculae   Penetration/Aspiration details (puree) (None)  Pharyngeal - Mechanical Soft (None)  Penetration/Aspiration details (mechanical soft) (None)  Pharyngeal - Regular Delayed swallow initiation;Reduced tongue base retraction;Pharyngeal residue - valleculae  Penetration/Aspiration details (regular) (None)  Pharyngeal - Multi-consistency (None)  Penetration/Aspiration details (multi-consistency) (None)  Pharyngeal - Pill Delayed swallow initiation;Reduced tongue base retraction;Pharyngeal residue - valleculae  Penetration/Aspiration details (pill) (None)  Pharyngeal Comment (None)     CHL IP CERVICAL ESOPHAGEAL PHASE 07/23/2012  Cervical Esophageal Phase WFL  Pudding Teaspoon (None)  Pudding Cup (None)  Honey Teaspoon (None)  Honey Cup (None)  Honey Syringe (None)  Nectar Teaspoon (None)  Nectar Cup (None)  Nectar Straw (None)  Nectar Syringe (None)  Thin Teaspoon (None)  Thin Cup (None)  Thin Straw (None)  Thin Syringe (None)  Cervical Esophageal Comment (None)    CHL IP GO 07/23/2012  Functional Assessment Tool Used clinical judgement  Functional Limitations Swallowing  Swallow Current Status (W4665) CJ  Swallow Goal Status (L9357) (None)  Swallow Discharge Status (S1779) CJ  Motor Speech Current Status (T9030) (None)  Motor Speech Goal Status (S9233) (None)  Motor Speech Goal Status (A0762) (None)  Spoken Language Comprehension Current Status (U6333) (None)  Spoken Language Comprehension Goal Status (L4562) (None)  Spoken Language Comprehension Discharge Status (B6389) (None)  Spoken Language Expression Current Status (H7342) (None)  Spoken Language Expression Goal Status (A7681) (None)  Spoken Language Expression Discharge Status [phone]) (None)  Attention Current Status (O0355) (None)  Attention Goal Status (H7416) (None)  Attention Discharge Status (L8453) (None)  Memory Current Status (M4680) (None)  Memory Goal Status (H2122) (None)  Memory Discharge Status (Q8250)  (None)  Voice Current Status (I3704) (None)  Voice Goal Status (U8891) (None)  Voice Discharge Status (Q9450) (None)  Other Speech-Language Pathology Functional Limitation [phone]) (None)  Other Speech-Language Pathology Functional Limitation Goal Status (C0034) (None)  Other Speech-Language Pathology Functional Limitation Discharge Status [phone]) (None)          Herbie Baltimore, MA CCC-SLP [phone]  Jaloni Sorber, Katherene Ponto 12/11/2014, 1:00 PM

## 2014-12-11 NOTE — Clinical Social Work Placement (Signed)
Clinical Social Work Department CLINICAL SOCIAL WORK PLACEMENT NOTE 12/11/2014  Patient:  Sophia Oliver, Sophia Oliver  Account Number:  [id number] Admit date:  12/15/2014  Clinical Social Worker:  Lovey Newcomer  Date/time:  12/11/2014 11:52 AM  Clinical Social Work is seeking post-discharge placement for this patient at the following level of care:   [place]   (*CSW will update this form in Epic as items are completed)   12/11/2014  Patient/family provided with [place] Department of Clinical Social Work's list of facilities offering this level of care within the geographic area requested by the patient (or if unable, by the patient's family).  12/11/2014  Patient/family informed of their freedom to choose among providers that offer the needed level of care, that participate in Medicare, Medicaid or managed care program needed by the patient, have an available bed and are willing to accept the patient.  12/11/2014  Patient/family informed of [facility]' ownership interest in [facility], as well as of the fact that they are under no obligation to receive care at this facility.  PASARR submitted to EDS on  PASARR number received on   FL2 transmitted to all facilities in geographic area requested by pt/family on  12/11/2014 FL2 transmitted to all facilities within larger geographic area on   Patient informed that his/her managed care company has contracts with or will negotiate with  certain facilities, including the following:     Patient/family informed of bed offers received:   Patient chooses bed at  Physician recommends and patient chooses bed at    Patient to be transferred to  on   Patient to be transferred to facility by  Patient and family notified of transfer on  Name of family member notified:    The following physician request were entered in Epic:   Additional Comments:    Liz Beach MSW, [place], [place], [phone]

## 2014-12-11 NOTE — Clinical Social Work Psychosocial (Signed)
Clinical Social Work Department BRIEF PSYCHOSOCIAL ASSESSMENT 12/11/2014  Patient:  Sophia Oliver, Sophia Oliver     Account Number:  [id number]     Admit date:  12/27/2014  Clinical Social Worker:  Lovey Newcomer  Date/Time:  12/11/2014 11:19 AM  Referred by:  Physician  Date Referred:  12/11/2014 Referred for  SNF Placement   Other Referral:   NA   Interview type:  Patient Other interview type:   Patient alert and oriented at time of assessment.    PSYCHOSOCIAL DATA Living Status:  HUSBAND Admitted from facility:  [place] Level of care:  [place] Primary support name:  Herbie Baltimore and Rush Landmark Primary support relationship to patient:  FAMILY Degree of support available:   Patient reports that she has good family support at home.    CURRENT CONCERNS Current Concerns  Post-Acute Placement   Other Concerns:   NA    SOCIAL WORK ASSESSMENT / PLAN CSW met with patient at bedside to complete assessment. CSW asked patient how she was feeling today and patient just shook her head no in reply. Patient presents with a flat affect and overall expression of sadness. Patient misses being home. Patient states that she was admitted to the hospital from [facility]. Prior to [facility], the patient was at [facility], but the facility moved her to [facility] once her insurance stopped paying for SNF placement. When asked about her understanding for being admitted to the hospital, the patient mentions only her trouble swallowing and nothing else.    Patient is agreeable to SNF placement at discharge, but requests to go back to [facility] versus [facility]. CSW explained that we cannot guarantee a bed at [facility] but the referral would be made for the patient. Patient reports having strong family support at home from her son and husband. CSW will assit with DC as appropriate.   Assessment/plan status:  Psychosocial Support/Ongoing  Assessment of Needs Other assessment/ plan:   Complete Fl2, Fax, PASRR   Information/referral to community resources:   CSW contact information and SNF list given.    PATIENT'S/FAMILY'S RESPONSE TO PLAN OF CARE: Patient states that she is agreeable to SNF placement at discharge, but does NOT want to return to [facility] at discharge. Patient would feel comforted by being able to go to [facility] at discharge. CSW will assist.       Liz Beach MSW, [place], [place], [phone]

## 2014-12-11 NOTE — Progress Notes (Signed)
[facility] PROGRESS NOTE  Sophia Oliver:[id number] DOB: [birth year] DOA: 12/17/2014 PCP: [name], MD  Assessment/Plan: 1. Acute on chronic stage 3 CKD 1. Suspect related to poor po intake with concurrent diuretic use 2. Cont with gentle IVF and monitor i/o and daily wts 3. Continuing to hold diuretics 4. Renal function improving with IVF 2. PAF 1. Noted on presenting EKG 2. CHADS of at least 3. Unclear why pt is not on anticoagulation. She is, however, closely followed by Cardiology, last seen by Dr. Caryl Comes on 11/22/14 3. Currently rate controlled 4. Monitor for now 3. Combined chronic and diastolic chf 1. Clinically dehydrated 2. Continue gentle IVF per above 3. Monitor i/o and daily wts 4. DM2 1. Cont on SSI coverage 2. Glucose remains stable 5. UTI 1. UA suggestive of UTI 2. Follow up on urine culture 3. Cont on empiric rocephin 4. No leukocytosis 6. Severe protein calorie malnutrition 1. Consulted nutrition 2. Appreciate recs 7. DVT prophylaxis 1. Heparin subQ 8. Multinodular goiter 1. Neck mass noted on exam 2. Follow up US of neck demonstrates multinodular goiter with none of the nodules meeting imaging criteria to recommend percutaneous sampling 3. TSH and free T4 normal 4. SLP consulted with recs for NPO given concerns for aspiration 5. CT neck w/o esophageal obstruction 9. Dysphagia 1. SLP following 2. Pt is s/p MBS with recs for dysphagia 3 with thin liquids 10. Weakness 1. Consulted PT/OT 2. Therapy recs for SNF 11. Hypokalemia 1. Oliver added to IVF 2. Likely secondary to malnourishment  Code Status: DNR Family Communication: Pt in room, husband at bedside Disposition Plan: Pending   Consultants:  none  Procedures:    Antibiotics:  Rocephin 2/5>>> (indicate start date, and stop date if known)  HPI/Subjective: Reports wanting to drink. Feels thirsty  Objective: Filed Vitals:   12/10/14 2032 12/11/14 0110 12/11/14 0630 12/11/14 1307   BP: 123/66 111/59 117/79 117/80  Pulse: 84 90 91 89  Temp: 97.4 F (36.3 C)  97.8 F (36.6 C)   TempSrc: Oral  Oral   Resp: 18     Height:      Weight:   44.8 kg (98 lb 12.3 oz)   SpO2: 99%  97%     Intake/Output Summary (Last 24 hours) at 12/11/14 1447 Last data filed at 12/11/14 0900  Gross per 24 hour  Intake    550 ml  Output      0 ml  Net    550 ml   Filed Weights   12/06/2014 1654 12/10/14 0430 12/11/14 0630  Weight: 47 kg (103 lb 9.9 oz) 46.3 kg (102 lb 1.2 oz) 44.8 kg (98 lb 12.3 oz)    Exam:   General:  Awake, sitting up in bed, in nad  Cardiovascular: regular, s1, s2  Respiratory: normal resp effort, no wheezing  Abdomen: soft,nondistended  Musculoskeletal: perfused, no clubbing   Data Reviewed: Basic Metabolic Panel:  Recent Labs Lab 12/05/14 0510 12/14/2014 1322 12/10/14 0722 12/11/14 0535 12/11/14 0800  NA 143 139 140 145  --   Oliver 3.9 3.7 3.5 2.9*  --   CL 109 104 107 113*  --   CO2 23 22 20 19   --   GLUCOSE 85 122* 95 104*  --   BUN 46* 72* 66* 48*  --   CREATININE 1.61* 2.73* 2.32* 1.74*  --   CALCIUM 8.6 8.4 7.8* 8.0*  --   MG  --   --   --   --  1.9   Liver Function Tests:  Recent Labs Lab 12/10/14 0722  AST 22  ALT 13  ALKPHOS 93  BILITOT 0.6  PROT 5.4*  ALBUMIN 3.0*   No results for input(s): LIPASE, AMYLASE in the last 168 hours. No results for input(s): AMMONIA in the last 168 hours. CBC:  Recent Labs Lab 12/31/2014 1322 12/10/14 0722  WBC 9.7 7.8  NEUTROABS 7.0  --   HGB 10.1* 9.9*  HCT 30.9* 31.1*  MCV 84.7 86.1  PLT 210 190   Cardiac Enzymes:  Recent Labs Lab 12/16/2014 1322 01/01/2015 1900 12/10/14 0056 12/10/14 0722  TROPONINI 0.06* 0.05* 0.06* 0.05*   BNP (last 3 results)  Recent Labs  12/19/2014 1322  BNP 1510.0*    ProBNP (last 3 results)  Recent Labs  06/24/14 1113 08/21/14 1655 08/21/14 2139  PROBNP 16090.0* 37601.0* 42122.0*    CBG:  Recent Labs Lab 12/10/14 1632 12/10/14 2103  12/11/14 0059 12/11/14 0809 12/11/14 1234  GLUCAP 88 98 90 102* 111*    Recent Results (from the past 240 hour(s))  MRSA PCR Screening     Status: Abnormal   Collection Time: 12/29/2014  5:15 PM  Result Value Ref Range Status   MRSA by PCR POSITIVE (A) NEGATIVE Final    Comment:        The GeneXpert MRSA Assay (FDA approved for NASAL specimens only), is one component of a comprehensive MRSA colonization surveillance program. It is not intended to diagnose MRSA infection nor to guide or monitor treatment for MRSA infections. RESULT CALLED TO, READ BACK BY AND VERIFIED WITH: CALLED TO RN DALLAS HART [phone] @2137  THANEY      Studies: Ct Soft Tissue Neck Wo Contrast  12/10/2014   CLINICAL DATA:  Dysphasia. Evaluate for possible esophageal obstruction.  EXAM: CT NECK WITHOUT CONTRAST  TECHNIQUE: Multidetector CT imaging of the neck was performed following the standard protocol without intravenous contrast.  COMPARISON:  Prior ultrasound from 12/08/2014.  FINDINGS: Cerebral atrophy noted within the partially visualized brain.  Small amount lying opacity present within the left sphenoid sinus. Paranasal sinuses are otherwise well pneumatized. No mastoid effusion.  No acute abnormality seen about the visualized orbits.  Salivary glands including the parotid glands and submandibular glands are within normal limits.  The oral cavity is unremarkable without mass lesion or definite collection on this noncontrast examination. Palatine tonsils appear to be absent. Parapharyngeal fat preserved. Oropharynx and nasopharynx within normal limits. No retropharyngeal fluid collection. Epiglottis normal. Vallecula clear. Hypopharynx and supraglottic larynx are normal. True vocal cords are symmetric bilaterally. Subglottic airway is clear and widely patent.  No acute abnormality seen within the visualized upper esophagus.  Enlarged multinodular goiter involving predominantly the left lobe of thyroid noted,  similar to prior ultrasound.  No adenopathy identified within the neck.  Visualized superior mediastinum within normal limits.  Prominent atheromatous plaque present within the visualize aortic arch.  Large bilateral pleural effusions are partially visualized. Visualized lungs are otherwise clear.  Left breast implant partially visualized. There is a right pacemaker in place.  4 mm anterolisthesis of C3 on C4 present. There is near complete ankylosis of C4 on C5 and C5 on C6. These changes are likely chronic and related to degenerative changes. No acute osseous abnormality. No worrisome lytic or blastic osseous lesion. Right shoulder arthroplasty partially visualized.  IMPRESSION: 1. No CT evidence for acute abnormality within the neck. No obstructive lesion or other abnormality identified within the upper esophagus. No acute inflammatory changes identified  within the neck. 2. Enlarged multinodular left lobe of thyroid as seen on recent ultrasound. Finding is most compatible with a multinodular goiter. 3. Large bilateral pleural effusions, incompletely visualized. 4. Advanced multilevel degenerative disc disease within the cervical spine with 4 mm anterolisthesis of C3 on C4 and near complete ankylosis of C4 on C5 and C5 on C6.   Electronically Signed   By: Jeannine Boga M.D.   On: 12/10/2014 23:41   US Soft Tissue Head/neck  12/29/2014   CLINICAL DATA:  Nodule within the neck, to the left of midline.  EXAM: THYROID ULTRASOUND  TECHNIQUE: Ultrasound examination of the thyroid gland and adjacent soft tissues was performed.  COMPARISON:  None.  FINDINGS: Right thyroid lobe  Measurements: Diminutive measuring 2.7 x 1.0 x 0.9 cm.  Right, superior - 0.4 x 0.4 x 0.5 cm-anechoic, likely cystic.  Right, mid, - 0.5 x 0.5 x 0.9 cm - mixed echogenic, solid.  Right, inferior, lateral - 0.5 x 0.5 x 0.5 cm - anechoic with peripheral echogenic nodule with ring down artifact suggestive of colloid.  Left thyroid lobe   Measurements: Normal in size measuring 5.4 x 2.4 x 2.1 cm.  Left, mid, posterior - 0.9 x 1.0 x 1.0 cm - mixed echogenic, solid.  Left, inferior - 1.0 x 1.3 x 0.9 cm-mixed echogenic, solid  There are multiple additional sub 5 mm mixed echogenic nodules scattered throughout the remainder of the left lobe of the thyroid.  Isthmus  Thickness: Normal in size measuring 0.3 cm in diameter.  No discrete nodules identified within the thyroid isthmus.  Lymphadenopathy  None visualized.  IMPRESSION: Findings suggestive of multi nodular goiter, asymmetrically involving the left lobe of the thyroid. None of the discretely measured thyroid nodules currently meet imaging criteria to recommend percutaneous sampling.  This recommendation follows the consensus statement: Management of Thyroid Nodules Detected at [place]: Society of Radiologists in [place]. Radiology 2005; [id number]. None   Electronically Signed   By: Sandi Mariscal M.D.   On: 12/08/2014 15:44   Dg Swallowing Func-speech Pathology  12/11/2014   Katherene Ponto [place]bl[place], [place]     12/11/2014  1:02 PM  Objective Swallowing Evaluation: Modified Barium Swallowing Study   Patient Details  Name: Sophia Oliver MRN: [id number] Date of Birth: [birth year]  Today's Date: 12/11/2014 Time: SLP Start Time (ACUTE ONLY): 1150-SLP Stop Time (ACUTE  ONLY): 1214 SLP Time Calculation (min) (ACUTE ONLY): 24 min  Past Medical History:  Past Medical History  Diagnosis Date  . Syncope   . Sinus node dysfunction     a. s/p pacemaker.  . Wide-complex tachycardia   . Cholelithiasis   . Glucose intolerance (impaired glucose tolerance)   . Anxiety   . PONV (postoperative nausea and vomiting)   . Chronic kidney disease     a. Baseline Cr reported 1.8-2.0.  . Injury of right upper arm     after venipuncture in 3/13 - pt describes pain afterwards- no  followup done seen by PCP- no further treatment   . PAF (paroxysmal atrial fibrillation)   . Left bundle branch block   .  Tachycardia-bradycardia syndrome   . CHF (congestive heart failure)     a. EF 30-35% by echo 2015.  [name] Pacemaker     Guidant, implanted 2006  . Type II diabetes mellitus     "recently dx'd" (03/09/2014)  . Breast cancer 2003    "radiation on right; mastectomy on the left"  . Hypertension   .  GERD (gastroesophageal reflux disease)   . DJD (degenerative joint disease)   . Arthritis     "real bad in my lower back" (03/09/2014)  . Chronic lower back pain   . Orthostatic hypotension     a. Diuretics d/c'd 03/2014.  [name] PAT (paroxysmal atrial tachycardia)    Past Surgical History:  Past Surgical History  Procedure Laterality Date  . Replacement total knee bilateral Bilateral   . Total shoulder replacement    . Tonsillectomy    . Orif distal femur fracture Right 12/2007  . Tonsillectomy    . Mastectomy Left 2003  . Total hip arthroplasty  06/16/2012    Procedure: TOTAL HIP ARTHROPLASTY;  Surgeon: Mauri Pole,  MD;  Location: [facility];  Service: Orthopedics;  Laterality: Left;  . Hip closed reduction  06/30/2012    Procedure: CLOSED MANIPULATION HIP;  Surgeon: Mauri Pole,  MD;  Location: [facility];  Service: Orthopedics;  Laterality: Left;  . Abdominal hysterectomy    . Pacemaker insertion      Guidant Insignia I  . Cataract extraction w/ intraocular lens  implant, bilateral  Bilateral 2000's  . Refractive surgery Bilateral 01/2014  . Inguinal hernia repair Left     "had emergency OR for it a few years ago" (03/09/2014)  . Joint replacement    . Breast biopsy Bilateral    HPI:  HPI: 81 yo female adm to [facility] with complaint of dyphagia -  diagnosed with ARF, Pt with h/o multinodular goiter, CHF,  esophageal dysphagia, syncope, psh + for hip sx.  Pt has been at  rehab since 2013 and reports her swallowing problems have been  ongoing but have worsened predominantly in the last 2-3 weeks  resulting in weight loss x 20-30 pounds in 3 months.  Esophagram  2015 showed dysmotility, silent + laryngeal penetration.  MBS in  2013 showed mild  pharyngeal deficits.  Pt now reports significant  pain with swallowing and sensation of stasis on left side of  neck.  Swallow evaluation ordered.   No Data Recorded  Assessment / Plan / Recommendation CHL IP CLINICAL IMPRESSIONS 12/11/2014  Dysphagia Diagnosis Mild pharyngeal phase dysphagia  Clinical impression Pt demonstrates mild motor sensory  oropharyngeal deficits. Oral phase is adequate though there is  slight base of tongue weakness resulting in mild base of  tongue/vallecular residuals post swallow. Swallow initaiton is  delayed with trace silent penetration to the cords with 50% of  thin boluses. A cued throat clear and second swallow appeared  most effective though verbal cues to "sip and hold" as well as a  chin tuck were both effective in reducing penetration events.  Nectar thick liquids were also very well tolerated if pt appears  to not be able to to follow recommendations with thin liquids.  There was no evidence of esophageal stasis and no signs of  anatomical impedance of bolus flow. Pt continues to report a  feeling of "paralysis" though hyolaryngeal mobility was very  good. Recommend pt resume dys 3 (mechanical soft) diet with thin  liquids with cues to clear throat and swallow again. SLP will f/u  for further instruction and techniques as needed.       CHL IP TREATMENT RECOMMENDATION 12/11/2014  Treatment Plan Recommendations Therapy as outlined in treatment  plan below     CHL IP DIET RECOMMENDATION 12/11/2014  Diet Recommendations Dysphagia 3 (Mechanical Soft);Thin liquid  Liquid Administration via Cup  Medication Administration Whole meds with puree  Compensations  Slow rate;Small sips/bites;Clear throat  intermittently;Multiple dry swallows after each bite/sip  Postural Changes and/or Swallow Maneuvers Seated upright 90  degrees;Out of bed for meals     CHL IP OTHER RECOMMENDATIONS 12/11/2014  Recommended Consults (None)  Oral Care Recommendations Oral care BID  Other Recommendations (None)     CHL  IP FOLLOW UP RECOMMENDATIONS 12/11/2014  Follow up Recommendations Skilled Nursing facility     [facility] IP FREQUENCY AND DURATION 12/11/2014  Speech Therapy Frequency (ACUTE ONLY) min 2x/week  Treatment Duration 2 weeks     Pertinent Vitals/Pain NA    SLP Swallow Goals No flowsheet data found.  No flowsheet data found.    CHL IP REASON FOR REFERRAL 12/11/2014  Reason for Referral Objectively evaluate swallowing function     CHL IP ORAL PHASE 12/11/2014  Lips (None)  Tongue (None)  Mucous membranes (None)  Nutritional status (None)  Other (None)  Oxygen therapy (None)  Oral Phase WFL  Oral - Pudding Teaspoon (None)  Oral - Pudding Cup (None)  Oral - Honey Teaspoon (None)  Oral - Honey Cup (None)  Oral - Honey Syringe (None)  Oral - Nectar Teaspoon (None)  Oral - Nectar Cup (None)  Oral - Nectar Straw (None)  Oral - Nectar Syringe (None)  Oral - Ice Chips (None)  Oral - Thin Teaspoon (None)  Oral - Thin Cup (None)  Oral - Thin Straw (None)  Oral - Thin Syringe (None)  Oral - Puree (None)  Oral - Mechanical Soft (None)  Oral - Regular (None)  Oral - Multi-consistency (None)  Oral - Pill (None)  Oral Phase - Comment (None)      CHL IP PHARYNGEAL PHASE 12/11/2014  Pharyngeal Phase Impaired  Pharyngeal - Pudding Teaspoon (None)  Penetration/Aspiration details (pudding teaspoon) (None)  Pharyngeal - Pudding Cup (None)  Penetration/Aspiration details (pudding cup) (None)  Pharyngeal - Honey Teaspoon (None)  Penetration/Aspiration details (honey teaspoon) (None)  Pharyngeal - Honey Cup (None)  Penetration/Aspiration details (honey cup) (None)  Pharyngeal - Honey Syringe (None)  Penetration/Aspiration details (honey syringe) (None)  Pharyngeal - Nectar Teaspoon (None)  Penetration/Aspiration details (nectar teaspoon) (None)  Pharyngeal - Nectar Cup Delayed swallow initiation;Pharyngeal  residue - valleculae;Reduced tongue base retraction  Penetration/Aspiration details (nectar cup) (None)  Pharyngeal - Nectar Straw (None)   Penetration/Aspiration details (nectar straw) (None)  Pharyngeal - Nectar Syringe (None)  Penetration/Aspiration details (nectar syringe) (None)  Pharyngeal - Ice Chips (None)  Penetration/Aspiration details (ice chips) (None)  Pharyngeal - Thin Teaspoon (None)  Penetration/Aspiration details (thin teaspoon) (None)  Pharyngeal - Thin Cup Delayed swallow initiation;Reduced tongue  base retraction;Penetration/Aspiration before swallow;Pharyngeal  residue - valleculae  Penetration/Aspiration details (thin cup) Material enters airway,  CONTACTS cords and not ejected out;Material enters airway,  remains ABOVE vocal cords and not ejected out;Material does not  enter airway  Pharyngeal - Thin Straw (None)  Penetration/Aspiration details (thin straw) (None)  Pharyngeal - Thin Syringe (None)  Penetration/Aspiration details (thin syringe') (None)  Pharyngeal - Puree Delayed swallow initiation;Reduced tongue base  retraction;Pharyngeal residue - valleculae  Penetration/Aspiration details (puree) (None)  Pharyngeal - Mechanical Soft (None)  Penetration/Aspiration details (mechanical soft) (None)  Pharyngeal - Regular Delayed swallow initiation;Reduced tongue  base retraction;Pharyngeal residue - valleculae  Penetration/Aspiration details (regular) (None)  Pharyngeal - Multi-consistency (None)  Penetration/Aspiration details (multi-consistency) (None)  Pharyngeal - Pill Delayed swallow initiation;Reduced tongue base  retraction;Pharyngeal residue - valleculae  Penetration/Aspiration details (pill) (None)  Pharyngeal Comment (None)     CHL IP CERVICAL ESOPHAGEAL  PHASE 07/23/2012  Cervical Esophageal Phase WFL  Pudding Teaspoon (None)  Pudding Cup (None)  Honey Teaspoon (None)  Honey Cup (None)  Honey Syringe (None)  Nectar Teaspoon (None)  Nectar Cup (None)  Nectar Straw (None)  Nectar Syringe (None)  Thin Teaspoon (None)  Thin Cup (None)  Thin Straw (None)  Thin Syringe (None)  Cervical Esophageal Comment (None)    CHL IP GO  07/23/2012  Functional Assessment Tool Used clinical judgement  Functional Limitations Swallowing  Swallow Current Status (P5916) CJ  Swallow Goal Status (B8466) (None)  Swallow Discharge Status (Z9935) CJ  Motor Speech Current Status (T0177) (None)  Motor Speech Goal Status (L3903) (None)  Motor Speech Goal Status (E0923) (None)  Spoken Language Comprehension Current Status (R0076) (None)  Spoken Language Comprehension Goal Status (A2633) (None)  Spoken Language Comprehension Discharge Status (H5456) (None)  Spoken Language Expression Current Status (Y5638) (None)  Spoken Language Expression Goal Status (L3734) (None)  Spoken Language Expression Discharge Status [phone]) (None)  Attention Current Status (T1572) (None)  Attention Goal Status (I2035) (None)  Attention Discharge Status (D9741) (None)  Memory Current Status (U3845) (None)  Memory Goal Status (X6468) (None)  Memory Discharge Status (E3212) (None)  Voice Current Status (Y4825) (None)  Voice Goal Status (O0370) (None)  Voice Discharge Status (W8889) (None)  Other Speech-Language Pathology Functional Limitation [phone])  (None)  Other Speech-Language Pathology Functional Limitation Goal Status  (W3888) (None)  Other Speech-Language Pathology Functional Limitation Discharge  Status [phone]) (None)          Herbie Baltimore, MA CCC-SLP [phone]  DeBlois, Katherene Ponto 12/11/2014, 1:00 PM     Scheduled Meds: . Chlorhexidine Gluconate Cloth  6 each Topical Q0600  . heparin  5,000 Units Subcutaneous 3 times per day  . insulin aspart  0-15 Units Subcutaneous 6 times per day  . metoprolol  2.5 mg Intravenous 4 times per day  . mupirocin ointment  1 application Nasal BID  . sodium chloride  3 mL Intravenous Q12H   Continuous Infusions: . dextrose 5 % and 0.9 % NaCl with KCl 40 mEq/L 50 mL/hr at 12/11/14 0917  . dextrose 5 % and 0.9% NaCl 50 mL/hr at 12/10/14 1455    Principal Problem:   ARF (acute renal failure) Active Problems:   CKD (chronic kidney  disease), stage III   Pacemaker   PAF (paroxysmal atrial fibrillation)   Malnutrition of moderate degree   Acute on chronic combined systolic and diastolic CHF (congestive heart failure)   DM (diabetes mellitus) type II controlled with renal manifestation   Paroxysmal atrial fibrillation   AKI (acute kidney injury)   Protein-calorie malnutrition, severe    Sophia Oliver  [facility] Pager [phone]. If 7PM-7AM, please contact night-coverage at [email or web address], password [facility] 12/11/2014, 2:47 PM  LOS: 2 days

## 2014-12-12 LAB — GLUCOSE, CAPILLARY: GLUCOSE-CAPILLARY: 100 mg/dL — AB (ref 70–99)

## 2014-12-12 LAB — BASIC METABOLIC PANEL
ANION GAP: 8 (ref 5–15)
BUN: 42 mg/dL — AB (ref 6–23)
CO2: 18 mmol/L — AB (ref 19–32)
CREATININE: 1.81 mg/dL — AB (ref 0.50–1.10)
Calcium: 8.1 mg/dL — ABNORMAL LOW (ref 8.4–10.5)
Chloride: 116 mmol/L — ABNORMAL HIGH (ref 96–112)
GFR calc non Af Amer: 24 mL/min — ABNORMAL LOW (ref >90)
GFR, EST AFRICAN AMERICAN: 28 mL/min — AB (ref >90)
Glucose, Bld: 112 mg/dL — ABNORMAL HIGH (ref 70–99)
Potassium: 3.6 mmol/L (ref 3.5–5.1)
Sodium: 142 mmol/L (ref 135–145)

## 2014-12-12 LAB — CLOSTRIDIUM DIFFICILE BY PCR: Toxigenic C. Difficile by PCR: NEGATIVE

## 2014-12-12 MED ORDER — DIPHENHYDRAMINE HCL 25 MG PO CAPS
25.0000 mg | ORAL_CAPSULE | Freq: Once | ORAL | Status: AC
  Administered 2014-12-12: 25 mg via ORAL
  Filled 2014-12-12: qty 1

## 2014-12-12 MED ORDER — LABETALOL HCL 5 MG/ML IV SOLN
5.0000 mg | INTRAVENOUS | Status: DC | PRN
  Administered 2014-12-12 – 2014-12-13 (×2): 5 mg via INTRAVENOUS
  Filled 2014-12-12 (×3): qty 4

## 2014-12-12 MED ORDER — MAGIC MOUTHWASH W/LIDOCAINE
5.0000 mL | Freq: Three times a day (TID) | ORAL | Status: DC | PRN
  Filled 2014-12-12: qty 5

## 2014-12-12 MED ORDER — ONDANSETRON HCL 4 MG/2ML IJ SOLN
4.0000 mg | INTRAMUSCULAR | Status: AC
  Administered 2014-12-12: 4 mg via INTRAMUSCULAR

## 2014-12-12 NOTE — Progress Notes (Signed)
Pt running SV tachy on telemetry with pulse up to 130's. Dr. Wyline Copas on unit and verbally notified. No new orders yet. Will continue to monitor pt quietly. Pt sitting up in chair with call light within reach. Francis Gaines Aboubacar Matsuo RN.

## 2014-12-12 NOTE — Progress Notes (Signed)
2 RNs attempted to straight cath patient x2 to obtain sample for urine culture, but catheter clogged by purulent vaginal discharge each attempt despite peri care prior to attempts.  NP on call notified, no additional orders at this time.  Will continue to monitor.

## 2014-12-12 NOTE — Progress Notes (Signed)
[facility] PROGRESS NOTE  Sophia Oliver VHQ:[id number] DOB: [birth year] DOA: 12/08/2014 PCP: [name], MD  Assessment/Plan: 1. Acute on chronic stage 3 CKD 1. Suspect related to poor po intake with concurrent diuretic use 2. Pt is cont with gentle IVF and monitor i/o and daily wts 3. Continuing to hold diuretics secondary to concerns of dehydration 4. Renal function improving with IVF 2. PAF 1. Noted on presenting EKG 2. CHADS of at least 3. Unclear why pt is not on anticoagulation. She is, however, closely followed by Cardiology, last seen by Dr. Caryl Comes on 11/22/14 3. Currently rate controlled 4. Monitor for now 3. Combined chronic and diastolic chf 1. Clinically dehydrated, but volume status has improved 2. Continue gentle IVF per above 3. Monitor i/o and daily wts 4. DM2 1. Cont on SSI coverage 2. Glucose remains stable 5. UTI 1. UA suggestive of UTI 2. Follow up on urine culture 3. Cont on empiric rocephin 4. No leukocytosis 6. Severe protein calorie malnutrition 1. Consulted nutrition 2. Appreciate recs 7. DVT prophylaxis 1. Heparin subQ 8. Multinodular goiter 1. Neck mass noted on exam 2. Follow up US of neck demonstrates multinodular goiter with none of the nodules meeting imaging criteria to recommend percutaneous sampling 3. TSH and free T4 normal 4. SLP consulted with recs for NPO given concerns for aspiration 5. CT neck w/o esophageal obstruction 9. Dysphagia 1. SLP following 2. Pt is s/p MBS with recs for dysphagia 3 with thin liquids 3. Pt still unable to tolerate any meaningful amounts of PO. Have discussed case with GI, who recommends esophogram. GI will see later, will appreciate input. 10. Weakness 1. Consulted PT/OT 2. Therapy recs for SNF 11. Hypokalemia 1. K added to IVF 2. Likely secondary to malnourishment  Code Status: DNR Family Communication: Pt in room Disposition Plan:  Pending   Consultants:  none  Procedures:    Antibiotics:  Rocephin 2/5>>> (indicate start date, and stop date if known)  HPI/Subjective: Complains of "a film" in the back of her throat. No acute events noted overnight. Denies cp or sob.  Objective: Filed Vitals:   12/11/14 1942 12/12/14 0041 12/12/14 0538 12/12/14 0830  BP: 93/61 92/61 94/59  102/59  Pulse: 106 95 115 117  Temp: 97.4 F (36.3 C)  97.8 F (36.6 C) 97.5 F (36.4 C)  TempSrc: Oral  Oral Oral  Resp:  20 18 20   Height:      Weight:   44.7 kg (98 lb 8.7 oz)   SpO2: 100% 100% 100% 100%    Intake/Output Summary (Last 24 hours) at 12/12/14 1251 Last data filed at 12/12/14 1127  Gross per 24 hour  Intake 2558.83 ml  Output     40 ml  Net 2518.83 ml   Filed Weights   12/10/14 0430 12/11/14 0630 12/12/14 0538  Weight: 46.3 kg (102 lb 1.2 oz) 44.8 kg (98 lb 12.3 oz) 44.7 kg (98 lb 8.7 oz)    Exam:   General:  Awake, sitting up in bed, in nad  Cardiovascular: regular, s1, s2  Respiratory: normal resp effort, no wheezing  Abdomen: soft,nondistended  Musculoskeletal: perfused, no clubbing   Data Reviewed: Basic Metabolic Panel:  Recent Labs Lab 12/29/2014 1322 12/10/14 0722 12/11/14 0535 12/11/14 0800 12/12/14 0520  NA 139 140 145  --  142  K 3.7 3.5 2.9*  --  3.6  CL 104 107 113*  --  116*  CO2 22 20 19   --  18*  GLUCOSE 122* 95  104*  --  112*  BUN 72* 66* 48*  --  42*  CREATININE 2.73* 2.32* 1.74*  --  1.81*  CALCIUM 8.4 7.8* 8.0*  --  8.1*  MG  --   --   --  1.9  --    Liver Function Tests:  Recent Labs Lab 12/10/14 0722  AST 22  ALT 13  ALKPHOS 93  BILITOT 0.6  PROT 5.4*  ALBUMIN 3.0*   No results for input(s): LIPASE, AMYLASE in the last 168 hours. No results for input(s): AMMONIA in the last 168 hours. CBC:  Recent Labs Lab 12/27/2014 1322 12/10/14 0722  WBC 9.7 7.8  NEUTROABS 7.0  --   HGB 10.1* 9.9*  HCT 30.9* 31.1*  MCV 84.7 86.1  PLT 210 190   Cardiac  Enzymes:  Recent Labs Lab 12/27/2014 1322 12/05/2014 1900 12/10/14 0056 12/10/14 0722  TROPONINI 0.06* 0.05* 0.06* 0.05*   BNP (last 3 results)  Recent Labs  12/12/2014 1322  BNP 1510.0*    ProBNP (last 3 results)  Recent Labs  06/24/14 1113 08/21/14 1655 08/21/14 2139  PROBNP 16090.0* 37601.0* 42122.0*    CBG:  Recent Labs Lab 12/11/14 1234 12/11/14 1641 12/11/14 2015 12/11/14 2350 12/12/14 0416  GLUCAP 111* 166* 143* 129* 100*    Recent Results (from the past 240 hour(s))  MRSA PCR Screening     Status: Abnormal   Collection Time: 12/07/2014  5:15 PM  Result Value Ref Range Status   MRSA by PCR POSITIVE (A) NEGATIVE Final    Comment:        The GeneXpert MRSA Assay (FDA approved for NASAL specimens only), is one component of a comprehensive MRSA colonization surveillance program. It is not intended to diagnose MRSA infection nor to guide or monitor treatment for MRSA infections. RESULT CALLED TO, READ BACK BY AND VERIFIED WITH: CALLED TO RN DALLAS HART [phone] @2137  THANEY   Clostridium Difficile by PCR     Status: None   Collection Time: 12/11/14  8:48 PM  Result Value Ref Range Status   C difficile by pcr NEGATIVE NEGATIVE Final     Studies: Ct Soft Tissue Neck Wo Contrast  12/10/2014   CLINICAL DATA:  Dysphasia. Evaluate for possible esophageal obstruction.  EXAM: CT NECK WITHOUT CONTRAST  TECHNIQUE: Multidetector CT imaging of the neck was performed following the standard protocol without intravenous contrast.  COMPARISON:  Prior ultrasound from 12/08/2014.  FINDINGS: Cerebral atrophy noted within the partially visualized brain.  Small amount lying opacity present within the left sphenoid sinus. Paranasal sinuses are otherwise well pneumatized. No mastoid effusion.  No acute abnormality seen about the visualized orbits.  Salivary glands including the parotid glands and submandibular glands are within normal limits.  The oral cavity is unremarkable without  mass lesion or definite collection on this noncontrast examination. Palatine tonsils appear to be absent. Parapharyngeal fat preserved. Oropharynx and nasopharynx within normal limits. No retropharyngeal fluid collection. Epiglottis normal. Vallecula clear. Hypopharynx and supraglottic larynx are normal. True vocal cords are symmetric bilaterally. Subglottic airway is clear and widely patent.  No acute abnormality seen within the visualized upper esophagus.  Enlarged multinodular goiter involving predominantly the left lobe of thyroid noted, similar to prior ultrasound.  No adenopathy identified within the neck.  Visualized superior mediastinum within normal limits.  Prominent atheromatous plaque present within the visualize aortic arch.  Large bilateral pleural effusions are partially visualized. Visualized lungs are otherwise clear.  Left breast implant partially visualized. There is a  right pacemaker in place.  4 mm anterolisthesis of C3 on C4 present. There is near complete ankylosis of C4 on C5 and C5 on C6. These changes are likely chronic and related to degenerative changes. No acute osseous abnormality. No worrisome lytic or blastic osseous lesion. Right shoulder arthroplasty partially visualized.  IMPRESSION: 1. No CT evidence for acute abnormality within the neck. No obstructive lesion or other abnormality identified within the upper esophagus. No acute inflammatory changes identified within the neck. 2. Enlarged multinodular left lobe of thyroid as seen on recent ultrasound. Finding is most compatible with a multinodular goiter. 3. Large bilateral pleural effusions, incompletely visualized. 4. Advanced multilevel degenerative disc disease within the cervical spine with 4 mm anterolisthesis of C3 on C4 and near complete ankylosis of C4 on C5 and C5 on C6.   Electronically Signed   By: Jeannine Boga M.D.   On: 12/10/2014 23:41   Dg Swallowing Func-speech Pathology  12/11/2014   Katherene Ponto  [place], [place]     12/11/2014  1:02 PM  Objective Swallowing Evaluation: Modified Barium Swallowing Study   Patient Details  Name: Sophia Oliver MRN: [id number] Date of Birth: 1924/04/12  Today's Date: 12/11/2014 Time: SLP Start Time (ACUTE ONLY): 1150-SLP Stop Time (ACUTE  ONLY): 1214 SLP Time Calculation (min) (ACUTE ONLY): 24 min  Past Medical History:  Past Medical History  Diagnosis Date  . Syncope   . Sinus node dysfunction     a. s/p pacemaker.  . Wide-complex tachycardia   . Cholelithiasis   . Glucose intolerance (impaired glucose tolerance)   . Anxiety   . PONV (postoperative nausea and vomiting)   . Chronic kidney disease     a. Baseline Cr reported 1.8-2.0.  . Injury of right upper arm     after venipuncture in 3/13 - pt describes pain afterwards- no  followup done seen by PCP- no further treatment   . PAF (paroxysmal atrial fibrillation)   . Left bundle branch block   . Tachycardia-bradycardia syndrome   . CHF (congestive heart failure)     a. EF 30-35% by echo 2015.  [name] Pacemaker     Guidant, implanted 2006  . Type II diabetes mellitus     "recently dx'd" (03/09/2014)  . Breast cancer 2003    "radiation on right; mastectomy on the left"  . Hypertension   . GERD (gastroesophageal reflux disease)   . DJD (degenerative joint disease)   . Arthritis     "real bad in my lower back" (03/09/2014)  . Chronic lower back pain   . Orthostatic hypotension     a. Diuretics d/c'd 03/2014.  [name] PAT (paroxysmal atrial tachycardia)    Past Surgical History:  Past Surgical History  Procedure Laterality Date  . Replacement total knee bilateral Bilateral   . Total shoulder replacement    . Tonsillectomy    . Orif distal femur fracture Right 12/2007  . Tonsillectomy    . Mastectomy Left 2003  . Total hip arthroplasty  06/16/2012    Procedure: TOTAL HIP ARTHROPLASTY;  Surgeon: Mauri Pole,  MD;  Location: [facility];  Service: Orthopedics;  Laterality: Left;  . Hip closed reduction  06/30/2012    Procedure: CLOSED MANIPULATION HIP;  Surgeon:  Mauri Pole,  MD;  Location: [facility];  Service: Orthopedics;  Laterality: Left;  . Abdominal hysterectomy    . Pacemaker insertion      Guidant Insignia I  . Cataract extraction w/ intraocular lens  implant,  bilateral  Bilateral 2000's  . Refractive surgery Bilateral 01/2014  . Inguinal hernia repair Left     "had emergency OR for it a few years ago" (03/09/2014)  . Joint replacement    . Breast biopsy Bilateral    HPI:  HPI: 82 yo female adm to [facility] with complaint of dyphagia -  diagnosed with ARF, Pt with h/o multinodular goiter, CHF,  esophageal dysphagia, syncope, psh + for hip sx.  Pt has been at  rehab since 2013 and reports her swallowing problems have been  ongoing but have worsened predominantly in the last 2-3 weeks  resulting in weight loss x 20-30 pounds in 3 months.  Esophagram  2015 showed dysmotility, silent + laryngeal penetration.  MBS in  2013 showed mild pharyngeal deficits.  Pt now reports significant  pain with swallowing and sensation of stasis on left side of  neck.  Swallow evaluation ordered.   No Data Recorded  Assessment / Plan / Recommendation CHL IP CLINICAL IMPRESSIONS 12/11/2014  Dysphagia Diagnosis Mild pharyngeal phase dysphagia  Clinical impression Pt demonstrates mild motor sensory  oropharyngeal deficits. Oral phase is adequate though there is  slight base of tongue weakness resulting in mild base of  tongue/vallecular residuals post swallow. Swallow initaiton is  delayed with trace silent penetration to the cords with 50% of  thin boluses. A cued throat clear and second swallow appeared  most effective though verbal cues to "sip and hold" as well as a  chin tuck were both effective in reducing penetration events.  Nectar thick liquids were also very well tolerated if pt appears  to not be able to to follow recommendations with thin liquids.  There was no evidence of esophageal stasis and no signs of  anatomical impedance of bolus flow. Pt continues to report a  feeling of "paralysis"  though hyolaryngeal mobility was very  good. Recommend pt resume dys 3 (mechanical soft) diet with thin  liquids with cues to clear throat and swallow again. SLP will f/u  for further instruction and techniques as needed.       CHL IP TREATMENT RECOMMENDATION 12/11/2014  Treatment Plan Recommendations Therapy as outlined in treatment  plan below     CHL IP DIET RECOMMENDATION 12/11/2014  Diet Recommendations Dysphagia 3 (Mechanical Soft);Thin liquid  Liquid Administration via Cup  Medication Administration Whole meds with puree  Compensations Slow rate;Small sips/bites;Clear throat  intermittently;Multiple dry swallows after each bite/sip  Postural Changes and/or Swallow Maneuvers Seated upright 90  degrees;Out of bed for meals     CHL IP OTHER RECOMMENDATIONS 12/11/2014  Recommended Consults (None)  Oral Care Recommendations Oral care BID  Other Recommendations (None)     CHL IP FOLLOW UP RECOMMENDATIONS 12/11/2014  Follow up Recommendations Skilled Nursing facility     [facility] IP FREQUENCY AND DURATION 12/11/2014  Speech Therapy Frequency (ACUTE ONLY) min 2x/week  Treatment Duration 2 weeks     Pertinent Vitals/Pain NA    SLP Swallow Goals No flowsheet data found.  No flowsheet data found.    CHL IP REASON FOR REFERRAL 12/11/2014  Reason for Referral Objectively evaluate swallowing function     CHL IP ORAL PHASE 12/11/2014  Lips (None)  Tongue (None)  Mucous membranes (None)  Nutritional status (None)  Other (None)  Oxygen therapy (None)  Oral Phase WFL  Oral - Pudding Teaspoon (None)  Oral - Pudding Cup (None)  Oral - Honey Teaspoon (None)  Oral - Honey Cup (None)  Oral - Honey Syringe (  None)  Oral - Nectar Teaspoon (None)  Oral - Nectar Cup (None)  Oral - Nectar Straw (None)  Oral - Nectar Syringe (None)  Oral - Ice Chips (None)  Oral - Thin Teaspoon (None)  Oral - Thin Cup (None)  Oral - Thin Straw (None)  Oral - Thin Syringe (None)  Oral - Puree (None)  Oral - Mechanical Soft (None)  Oral - Regular (None)  Oral -  Multi-consistency (None)  Oral - Pill (None)  Oral Phase - Comment (None)      CHL IP PHARYNGEAL PHASE 12/11/2014  Pharyngeal Phase Impaired  Pharyngeal - Pudding Teaspoon (None)  Penetration/Aspiration details (pudding teaspoon) (None)  Pharyngeal - Pudding Cup (None)  Penetration/Aspiration details (pudding cup) (None)  Pharyngeal - Honey Teaspoon (None)  Penetration/Aspiration details (honey teaspoon) (None)  Pharyngeal - Honey Cup (None)  Penetration/Aspiration details (honey cup) (None)  Pharyngeal - Honey Syringe (None)  Penetration/Aspiration details (honey syringe) (None)  Pharyngeal - Nectar Teaspoon (None)  Penetration/Aspiration details (nectar teaspoon) (None)  Pharyngeal - Nectar Cup Delayed swallow initiation;Pharyngeal  residue - valleculae;Reduced tongue base retraction  Penetration/Aspiration details (nectar cup) (None)  Pharyngeal - Nectar Straw (None)  Penetration/Aspiration details (nectar straw) (None)  Pharyngeal - Nectar Syringe (None)  Penetration/Aspiration details (nectar syringe) (None)  Pharyngeal - Ice Chips (None)  Penetration/Aspiration details (ice chips) (None)  Pharyngeal - Thin Teaspoon (None)  Penetration/Aspiration details (thin teaspoon) (None)  Pharyngeal - Thin Cup Delayed swallow initiation;Reduced tongue  base retraction;Penetration/Aspiration before swallow;Pharyngeal  residue - valleculae  Penetration/Aspiration details (thin cup) Material enters airway,  CONTACTS cords and not ejected out;Material enters airway,  remains ABOVE vocal cords and not ejected out;Material does not  enter airway  Pharyngeal - Thin Straw (None)  Penetration/Aspiration details (thin straw) (None)  Pharyngeal - Thin Syringe (None)  Penetration/Aspiration details (thin syringe') (None)  Pharyngeal - Puree Delayed swallow initiation;Reduced tongue base  retraction;Pharyngeal residue - valleculae  Penetration/Aspiration details (puree) (None)  Pharyngeal - Mechanical Soft (None)  Penetration/Aspiration  details (mechanical soft) (None)  Pharyngeal - Regular Delayed swallow initiation;Reduced tongue  base retraction;Pharyngeal residue - valleculae  Penetration/Aspiration details (regular) (None)  Pharyngeal - Multi-consistency (None)  Penetration/Aspiration details (multi-consistency) (None)  Pharyngeal - Pill Delayed swallow initiation;Reduced tongue base  retraction;Pharyngeal residue - valleculae  Penetration/Aspiration details (pill) (None)  Pharyngeal Comment (None)     CHL IP CERVICAL ESOPHAGEAL PHASE 07/23/2012  Cervical Esophageal Phase WFL  Pudding Teaspoon (None)  Pudding Cup (None)  Honey Teaspoon (None)  Honey Cup (None)  Honey Syringe (None)  Nectar Teaspoon (None)  Nectar Cup (None)  Nectar Straw (None)  Nectar Syringe (None)  Thin Teaspoon (None)  Thin Cup (None)  Thin Straw (None)  Thin Syringe (None)  Cervical Esophageal Comment (None)    CHL IP GO 07/23/2012  Functional Assessment Tool Used clinical judgement  Functional Limitations Swallowing  Swallow Current Status (E3662) CJ  Swallow Goal Status (H4765) (None)  Swallow Discharge Status (Y6503) CJ  Motor Speech Current Status (T4656) (None)  Motor Speech Goal Status (C1275) (None)  Motor Speech Goal Status (T7001) (None)  Spoken Language Comprehension Current Status (V4944) (None)  Spoken Language Comprehension Goal Status (H6759) (None)  Spoken Language Comprehension Discharge Status (F6384) (None)  Spoken Language Expression Current Status (Y6599) (None)  Spoken Language Expression Goal Status (J5701) (None)  Spoken Language Expression Discharge Status [phone]) (None)  Attention Current Status (Q3009) (None)  Attention Goal Status (Q3300) (None)  Attention Discharge Status (T6226) (None)  Memory Current Status (J3354) (  None)  Memory Goal Status [phone]) (None)  Memory Discharge Status [phone]) (None)  Voice Current Status [phone]) (None)  Voice Goal Status (V6945) (None)  Voice Discharge Status [phone]) (None)  Other Speech-Language Pathology  Functional Limitation [phone])  (None)  Other Speech-Language Pathology Functional Limitation Goal Status  (K9179) (None)  Other Speech-Language Pathology Functional Limitation Discharge  Status [phone]) (None)          Herbie Baltimore, MA CCC-SLP [phone]  [place], Katherene Ponto 12/11/2014, 1:00 PM     Scheduled Meds: . antiseptic oral rinse  7 mL Mouth Rinse q12n4p  . chlorhexidine  15 mL Mouth Rinse BID  . Chlorhexidine Gluconate Cloth  6 each Topical Q0600  . heparin  5,000 Units Subcutaneous 3 times per day  . insulin aspart  0-15 Units Subcutaneous 6 times per day  . metoprolol  2.5 mg Intravenous 4 times per day  . mupirocin ointment  1 application Nasal BID  . sodium chloride  3 mL Intravenous Q12H   Continuous Infusions: . dextrose 5 % and 0.9 % NaCl with KCl 40 mEq/L 50 mL/hr at 12/12/14 0330  . dextrose 5 % and 0.9% NaCl Stopped (12/11/14 2112)    Principal Problem:   ARF (acute renal failure) Active Problems:   CKD (chronic kidney disease), stage III   Pacemaker   PAF (paroxysmal atrial fibrillation)   Malnutrition of moderate degree   Acute on chronic combined systolic and diastolic CHF (congestive heart failure)   DM (diabetes mellitus) type II controlled with renal manifestation   Paroxysmal atrial fibrillation   AKI (acute kidney injury)   Protein-calorie malnutrition, severe    Sophia Oliver K  [facility] Pager [phone]. If 7PM-7AM, please contact night-coverage at [email or web address], password [facility] 12/12/2014, 12:51 PM  LOS: 3 days

## 2014-12-12 NOTE — Progress Notes (Signed)
Patient requesting PRN for sleep.  Patient also has order for urine culture but is incontinent.  NP on call notified, straight cath ordered and one time dose of benadryl ordered for sleep.  Will update patient, administer medication, and continue to monitor.

## 2014-12-12 NOTE — Progress Notes (Signed)
Pt continue to remain Afib on monitor; prn labetalol given; pt c/o back pain, prn tylenol given. Pt back to bed with Oxygen on at 2L; nurse tech report no urine output from pt during shift; Dr. Wyline Copas on unit verbally notified and he ordered to increase pt infusing fluid to 69ml. Pt fluids increased. Pt remains in bed with call light within reach. Will continue to monitor quietly. Francis Gaines Nollie Shiflett RN.

## 2014-12-12 NOTE — Progress Notes (Signed)
Physical Therapy Treatment Patient Details Name: Sophia Oliver MRN: [id number] DOB: 03-Jul-1924 Today's Date: 12/12/2014    History of Present Illness 81 yo female long term resident seen today for above. Nursing reports approx 10 lb weight gain in the last month. Lasix and aldactone stopped last month due to AKI (Cr 2.06 on 10/26/14). Last CXR 12/31st showed cardiomegaly, pulmonary edema but no pneumonia. Pt c/o worsening SOB x 1 day with palpitations, chills and swollen neck glands. Her roommate was dx with pneumonia last week. She denies CP, fever, sore throat or leg swelling. No HA or dizziness.    PT Comments    Pt reports she's feeling nauseous this morning. She had Zofran at 7:00am. Min assist to pivot from bed to recliner, nausea/fatigue limited activity tolerance.   Follow Up Recommendations  SNF;Supervision/Assistance - 24 hour     Equipment Recommendations  None recommended by PT    Recommendations for Other Services       Precautions / Restrictions Precautions Precautions: Fall Restrictions Weight Bearing Restrictions: No    Mobility  Bed Mobility Overal bed mobility: Needs Assistance Bed Mobility: Rolling;Supine to Sit Rolling: Min assist   Supine to sit: Min assist     General bed mobility comments: Min assist to raise trunk, HOB up 15*  Transfers Overall transfer level: Needs assistance Equipment used: Rolling walker (2 wheeled) Transfers: Sit to/from [facility] Sit to Stand: Min assist Stand pivot transfers: Min assist       General transfer comment: min A to rise, Min A to pivot with RW, Pt had uncontrolled descent to recliner 2* fatigue  Ambulation/Gait                 Stairs            Wheelchair Mobility    Modified Rankin (Stroke Patients Only)       Balance                                    Cognition Arousal/Alertness: Awake/alert Behavior During Therapy: WFL for tasks  assessed/performed Overall Cognitive Status: Within Functional Limits for tasks assessed                      Exercises      General Comments        Pertinent Vitals/Pain Pain Assessment: No/denies pain    Home Living                      Prior Function            PT Goals (current goals can now be found in the care plan section) Acute Rehab PT Goals Patient Stated Goal: to get back to rehab PT Goal Formulation: With patient/family Time For Goal Achievement: 12/24/14 Potential to Achieve Goals: Fair Progress towards PT goals: Not progressing toward goals - comment (nausea limiting activity tolerance today)    Frequency  Min 3X/week    PT Plan Current plan remains appropriate    Co-evaluation             End of Session Equipment Utilized During Treatment: Gait belt;Oxygen Activity Tolerance: Patient limited by fatigue Patient left: in chair;with call bell/phone within reach     Time: 3664-4034 PT Time Calculation (min) (ACUTE ONLY): 14 min  Charges:  $Therapeutic Activity: 8-22 mins  G Codes:      Blondell Reveal Kistler 12/12/2014, 11:18 AM [phone]

## 2014-12-12 NOTE — Consult Note (Signed)
Subjective:   HPI  The patient is an 81 year old female with multiple medical problems who we are asked to see in regards to dysphagia. The patient describes a sensation of something feeling stuck in the left side of her neck area. This troubles her when she swallows. She does have a multinodular goiter with enlargement of thyroid lobe on the left side. A modified barium swallow shows mild motor sensory oral pharyngeal deficits. Swallow initiation is delayed with trace silent penetration to the cords with 50% of thin boluses. Specifically there was no evidence of esophageal stasis and no sign of anatomical impedance of bolus flow. When I asked the patient once food passed beyond the neck was there a problem and it going down and she stated no.     Past Medical History  Diagnosis Date  . Syncope   . Sinus node dysfunction     a. s/p pacemaker.  . Wide-complex tachycardia   . Cholelithiasis   . Glucose intolerance (impaired glucose tolerance)   . Anxiety   . PONV (postoperative nausea and vomiting)   . Chronic kidney disease     a. Baseline Cr reported 1.8-2.0.  . Injury of right upper arm     after venipuncture in 3/13 - pt describes pain afterwards- no followup done seen by PCP- no further treatment   . PAF (paroxysmal atrial fibrillation)   . Left bundle branch block   . Tachycardia-bradycardia syndrome   . CHF (congestive heart failure)     a. EF 30-35% by echo 2015.  [name] Pacemaker     Guidant, implanted 2006  . Type II diabetes mellitus     "recently dx'd" (03/09/2014)  . Breast cancer 2003    "radiation on right; mastectomy on the left"  . Hypertension   . GERD (gastroesophageal reflux disease)   . DJD (degenerative joint disease)   . Arthritis     "real bad in my lower back" (03/09/2014)  . Chronic lower back pain   . Orthostatic hypotension     a. Diuretics d/c'd 03/2014.  [name] PAT (paroxysmal atrial tachycardia)    Past Surgical History  Procedure Laterality Date  .  Replacement total knee bilateral Bilateral   . Total shoulder replacement    . Tonsillectomy    . Orif distal femur fracture Right 12/2007  . Tonsillectomy    . Mastectomy Left 2003  . Total hip arthroplasty  06/16/2012    Procedure: TOTAL HIP ARTHROPLASTY;  Surgeon: Mauri Pole, MD;  Location: [facility];  Service: Orthopedics;  Laterality: Left;  . Hip closed reduction  06/30/2012    Procedure: CLOSED MANIPULATION HIP;  Surgeon: Mauri Pole, MD;  Location: [facility];  Service: Orthopedics;  Laterality: Left;  . Abdominal hysterectomy    . Pacemaker insertion      Guidant Insignia I  . Cataract extraction w/ intraocular lens  implant, bilateral Bilateral 2000's  . Refractive surgery Bilateral 01/2014  . Inguinal hernia repair Left     "had emergency OR for it a few years ago" (03/09/2014)  . Joint replacement    . Breast biopsy Bilateral    History   Social History  . Marital Status: Married    Spouse Name: N/A    Number of Children: N/A  . Years of Education: N/A   Occupational History  . Not on file.   Social History Main Topics  . Smoking status: Former Smoker -- 0.50 packs/day for 6 years    Types: Cigarettes  .  Smokeless tobacco: Never Used  . Alcohol Use: Yes     Comment: "I'll have a social drink maybe at [employer]  . Drug Use: No  . Sexual Activity: No   Other Topics Concern  . Not on file   Social History Narrative   Married, retired.    family history includes Hypertension in an other family member.  Current facility-administered medications:  .  acetaminophen (TYLENOL) tablet 650 mg, 650 mg, Oral, Q6H PRN **OR** acetaminophen (TYLENOL) suppository 650 mg, 650 mg, Rectal, Q6H PRN, Donne Hazel, MD .  antiseptic oral rinse (CPC / CETYLPYRIDINIUM CHLORIDE 0.05%) solution 7 mL, 7 mL, Mouth Rinse, q12n4p, Donne Hazel, MD, 7 mL at 12/12/14 1725 .  chlorhexidine (PERIDEX) 0.12 % solution 15 mL, 15 mL, Mouth Rinse, BID, Donne Hazel, MD, 15 mL at 12/12/14  [phone] .  Chlorhexidine Gluconate Cloth 2 % PADS 6 each, 6 each, Topical, Q0600, Donne Hazel, MD, 6 each at 12/12/14 0600 .  dextrose 5 % and 0.9 % NaCl with KCl 40 mEq/L infusion, , Intravenous, Continuous, Donne Hazel, MD, Last Rate: 50 mL/hr at 12/12/14 0330 .  dextrose 5 %-0.9 % sodium chloride infusion, , Intravenous, Continuous, Donne Hazel, MD, Stopped at 12/11/14 2112 .  heparin injection 5,000 Units, 5,000 Units, Subcutaneous, 3 times per day, Donne Hazel, MD, 5,000 Units at 12/12/14 1316 .  insulin aspart (novoLOG) injection 0-15 Units, 0-15 Units, Subcutaneous, 6 times per day, Donne Hazel, MD, 2 Units at 12/12/14 1725 .  labetalol (NORMODYNE,TRANDATE) injection 5 mg, 5 mg, Intravenous, Q10 min PRN, Donne Hazel, MD .  magic mouthwash w/lidocaine, 5 mL, Oral, TID PRN, Donne Hazel, MD .  metoprolol (LOPRESSOR) injection 2.5 mg, 2.5 mg, Intravenous, 4 times per day, Donne Hazel, MD, 2.5 mg at 12/12/14 1724 .  mupirocin ointment (BACTROBAN) 2 % 1 application, 1 application, Nasal, BID, Donne Hazel, MD, 1 application at 63/78/58 1054 .  ondansetron (ZOFRAN) tablet 4 mg, 4 mg, Oral, Q6H PRN **OR** ondansetron (ZOFRAN) injection 4 mg, 4 mg, Intravenous, Q6H PRN, Donne Hazel, MD, 4 mg at 12/12/14 0702 .  sodium chloride 0.9 % injection 3 mL, 3 mL, Intravenous, Q12H, Donne Hazel, MD, 3 mL at 12/12/14 1054 Allergies  Allergen Reactions  . Codeine Nausea And Vomiting  . Other Nausea And Vomiting and Other (See Comments)    Narcotics causes dizziness  . Hydrocodone-Acetaminophen Itching     Objective:     BP 107/74 mmHg  Pulse 118  Temp(Src) 97.7 F (36.5 C) (Oral)  Resp 24  Ht 5\' 4"  (1.626 m)  Wt 44.7 kg (98 lb 8.7 oz)  BMI 16.91 kg/m2  SpO2 100%  She is in no distress  Heart regular rhythm  Lungs clear  Abdomen nontender  Laboratory No components found for: D1    Assessment:     Oral pharyngeal dysphagia. This does not sound like there is  an esophageal component to what she is describing.      Plan:     Proceed with esophagram as planned.

## 2014-12-12 NOTE — Progress Notes (Signed)
Speech Language Pathology Treatment:    Patient Details Name: Sophia Oliver MRN: [id number] DOB: 27-Aug-1924 Today's Date: 12/12/2014 Time: 7903-8333 SLP Time Calculation (min) (ACUTE ONLY): 10 min  Assessment / Plan / Recommendation Clinical Impression  Goal of session was to determine pt tolerance of current dys 3/ thin liquid diet. Pt reported she was feeling nauseous and had not had anything to eat during breakfast; only liquids. Pt coughed intermittently following PO trials with thin liquids, however, do not suspect caused by aspiration. Aspiration of thin liquids was not observed on objective swallow study performed on prior day. Pt was unable to recall safe swallow strategies when asked by SLP.  Practiced with pt safe swallow strategies; small sips followed by throat clear and dry swallow. Pt performed safe swallow strategies with minimal cues from SLP. Left a sign in the room with safe swallow strategies. Recommend pt continue dys 3/ thin liquid diet.    HPI HPI: 81 yo female adm to [facility] with complaint of dyphagia - diagnosed with ARF, Pt with h/o multinodular goiter, CHF, esophageal dysphagia, syncope, psh + for hip sx.  Pt has been at rehab since 2013 and reports her swallowing problems have been ongoing but have worsened predominantly in the last 2-3 weeks resulting in weight loss x 20-30 pounds in 3 months.  Esophagram 2015 showed dysmotility, silent + laryngeal penetration.  MBS in 2013 showed mild pharyngeal deficits.  Pt now reports significant pain with swallowing and sensation of stasis on left side of neck.  Swallow evaluation ordered.    Pertinent Vitals Pain Assessment: Faces Faces Pain Scale: Hurts even more  SLP Plan       Recommendations Diet recommendations: Dysphagia 3 (mechanical soft);Thin liquid Medication Administration: Whole meds with puree Supervision: Patient able to self feed Compensations: Slow rate;Small sips/bites;Clear throat intermittently;Multiple dry swallows  after each bite/sip Postural Changes and/or Swallow Maneuvers: Seated upright 90 degrees              Oral Care Recommendations: Oral care BID Follow up Recommendations: Skilled Nursing facility    [place]     Bermudez-Bosch, Angelly Spearing 12/12/2014, 12:22 PM

## 2014-12-12 NOTE — Care Management Note (Unsigned)
    Page 1 of 1   12/12/2014     4:49:12 PM CARE MANAGEMENT NOTE 12/12/2014  Patient:  ENIJAH, FURR   Account Number:  [id number]  Date Initiated:  12/12/2014  Documentation initiated by:  Keyuna Cuthrell  Subjective/Objective Assessment:   Pt adm on 12/27/2014 with dehydration, AKI, dysphagia.  PTA, pt resided at [facility].     Action/Plan:   CSW consulted to facilitate return to SNF when medically stable for dc.   Anticipated DC Date:  12/14/2014   Anticipated DC Plan:  SKILLED NURSING FACILITY  In-house referral  Clinical Social Worker      DC Planning Services  CM consult      Choice offered to / List presented to:             Status of service:  In process, will continue to follow Medicare Important Message given?  YES (If response is "NO", the following Medicare IM given date fields will be blank) Date Medicare IM given:  12/12/2014 Medicare IM given by:  Mystery Schrupp Date Additional Medicare IM given:   Additional Medicare IM given by:    Discharge Disposition:    Per UR Regulation:  Reviewed for med. necessity/level of care/duration of stay  If discussed at [place] of Stay Meetings, dates discussed:    Comments:

## 2014-12-12 NOTE — Progress Notes (Signed)
OT Cancellation Note  Patient Details Name: Sophia Oliver MRN: [id number] DOB: Nov 12, 1923   Cancelled Treatment:    Reason Eval/Treat Not Completed: Other (comment) Pt is Medicare and current D/C plan is SNF. No apparent immediate acute care OT needs, therefore will defer OT to SNF. If OT eval is needed please call Acute Rehab Dept. at [phone] or text page OT at [phone].    Almon Register [phone] 12/12/2014, 12:19 PM

## 2014-12-12 NOTE — Progress Notes (Signed)
Pt resting comfortably in bed with call light within reach. Reported off to incoming RN. Delia Heady RN

## 2014-12-13 ENCOUNTER — Inpatient Hospital Stay (HOSPITAL_COMMUNITY): Payer: Medicare Other

## 2014-12-13 DIAGNOSIS — R627 Adult failure to thrive: Secondary | ICD-10-CM | POA: Insufficient documentation

## 2014-12-13 LAB — BASIC METABOLIC PANEL
Anion gap: 10 (ref 5–15)
BUN: 44 mg/dL — AB (ref 6–23)
CALCIUM: 8.4 mg/dL (ref 8.4–10.5)
CO2: 15 mmol/L — AB (ref 19–32)
Chloride: 118 mmol/L — ABNORMAL HIGH (ref 96–112)
Creatinine, Ser: 2.46 mg/dL — ABNORMAL HIGH (ref 0.50–1.10)
GFR calc Af Amer: 19 mL/min — ABNORMAL LOW (ref >90)
GFR calc non Af Amer: 16 mL/min — ABNORMAL LOW (ref >90)
GLUCOSE: 158 mg/dL — AB (ref 70–99)
Potassium: 5.7 mmol/L — ABNORMAL HIGH (ref 3.5–5.1)
SODIUM: 143 mmol/L (ref 135–145)

## 2014-12-13 LAB — BLOOD GAS, ARTERIAL
Acid-base deficit: 14.4 mmol/L — ABNORMAL HIGH (ref 0.0–2.0)
BICARBONATE: 10.1 meq/L — AB (ref 20.0–24.0)
DRAWN BY: 307971
O2 Content: 3 L/min
O2 Saturation: 98.3 %
PATIENT TEMPERATURE: 98.6
PCO2 ART: 17.8 mmHg — AB (ref 35.0–45.0)
PH ART: 7.369 (ref 7.350–7.450)
TCO2: 10.6 mmol/L (ref 0–100)
pO2, Arterial: 118 mmHg — ABNORMAL HIGH (ref 80.0–100.0)

## 2014-12-13 LAB — GLUCOSE, CAPILLARY
GLUCOSE-CAPILLARY: 143 mg/dL — AB (ref 70–99)
Glucose-Capillary: 139 mg/dL — ABNORMAL HIGH (ref 70–99)
Glucose-Capillary: 151 mg/dL — ABNORMAL HIGH (ref 70–99)
Glucose-Capillary: 127 mg/dL — ABNORMAL HIGH (ref 70–99)
Glucose-Capillary: 133 mg/dL — ABNORMAL HIGH (ref 70–99)
Glucose-Capillary: 167 mg/dL — ABNORMAL HIGH (ref 70–99)

## 2014-12-13 LAB — D-DIMER, QUANTITATIVE (NOT AT ARMC): D DIMER QUANT: 1.52 ug{FEU}/mL — AB (ref 0.00–0.48)

## 2014-12-13 MED ORDER — LORAZEPAM 2 MG/ML IJ SOLN
0.5000 mg | Freq: Two times a day (BID) | INTRAMUSCULAR | Status: DC | PRN
Start: 2014-12-13 — End: 2014-12-14
  Administered 2014-12-13: 0.5 mg via INTRAVENOUS
  Filled 2014-12-13: qty 1

## 2014-12-13 MED ORDER — KCL IN DEXTROSE-NACL 40-5-0.9 MEQ/L-%-% IV SOLN
INTRAVENOUS | Status: AC
  Administered 2014-12-13: 10:00:00 via INTRAVENOUS
  Filled 2014-12-13 (×2): qty 1000

## 2014-12-13 MED ORDER — LORAZEPAM 0.5 MG PO TABS
0.2500 mg | ORAL_TABLET | Freq: Once | ORAL | Status: AC
Start: 2014-12-13 — End: 2014-12-13
  Administered 2014-12-13: 0.25 mg via ORAL
  Filled 2014-12-13: qty 1

## 2014-12-14 LAB — GLUCOSE, CAPILLARY
GLUCOSE-CAPILLARY: 131 mg/dL — AB (ref 70–99)
Glucose-Capillary: 167 mg/dL — ABNORMAL HIGH (ref 70–99)
Glucose-Capillary: 155 mg/dL — ABNORMAL HIGH (ref 70–99)

## 2014-12-14 LAB — URINE CULTURE

## 2015-01-03 NOTE — Clinical Social Work Placement (Addendum)
    Clinical Social Work Department CLINICAL SOCIAL WORK PLACEMENT NOTE 01/10/15  Patient:  Sophia Oliver, Sophia Oliver  Account Number:  [id number] [place] date:  12/19/2014  Clinical Social Worker:  Lovey Newcomer  Date/time:  12/11/2014 11:52 AM  Clinical Social Work is seeking post-discharge placement for this patient at the following level of care:   [place]   (*CSW will update this form in Epic as items are completed)   12/11/2014  Patient/family provided with [facility] Department of Clinical Social Work's list of facilities offering this level of care within the geographic area requested by the patient (or if unable, by the patient's family).  12/11/2014  Patient/family informed of their freedom to choose among providers that offer the needed level of care, that participate in Medicare, Medicaid or managed care program needed by the patient, have an available bed and are willing to accept the patient.  12/11/2014  Patient/family informed of [facility]' ownership interest in [facility], as well as of the fact that they are under no obligation to receive care at this facility.  PASARR submitted to EDS on  PASARR number received on   FL2 transmitted to all facilities in geographic area requested by pt/family on  12/11/2014 FL2 transmitted to all facilities within larger geographic area on   Patient informed that his/her managed care company has contracts with or will negotiate with  certain facilities, including the following:     Patient/family informed of bed offers received:   Patient chooses bed at  Physician recommends and patient chooses bed at    Patient to be transferred to  on   Patient to be transferred to facility by  Patient and family notified of transfer on  Name of family member notified:    The following physician request were entered in Epic:   Additional Comments: January 10, 2015 Patient Expired.  Lorie Phenix. [place], [place] [phone]

## 2015-01-03 NOTE — Progress Notes (Signed)
Pharmacy consulted for: family reports pt is on NOAC? None listed. Please confirm. I spoke with husband who thinks she in on an anticoagulant. I reviewed EPIC records and found that on 06/30/14 hospital visit Dr. Hartford Poli discontinued Pradaxa and stared pt on aspirin given high fall risk.  [facility]   Pt no longer on aspirin per [facility] or medication history reports.  The last time aspirin was on her medication list was 08/21/14. Last time on Pradaxa 06/2014.  Eudelia Bunch, Pharm.D. [phone] December 29, 2014 2:06 PM

## 2015-01-03 NOTE — Progress Notes (Signed)
Speech Language Pathology Treatment: Dysphagia  Patient Details Name: Sophia Oliver MRN: [id number] DOB: 12-20-1923 Today's Date: 2015/01/07 Time: 4098-1191 SLP Time Calculation (min) (ACUTE ONLY): 11 min  Assessment / Plan / Recommendation Clinical Impression  Goal of sessions was to determine pt tolerance of current dys 3/ thin liquid diet. Pt reported she was having difficulty breathing and that she couldn't swallow. SLP noted pt was agitated and was breathing at a faster rate than observed in prior session. Pt immediately coughed following small sips of thin liquids, however, not suspect s/s of aspiration. Suspect pt's high level of anxiousness and agitation caused coughing episode. Aspiration was not observed on recent objective swallow study. Given pt's fluctuating medical status and high level of agitation and anxiousness recommend pt downgrade to dys 3/ nectar thick liquid diet with use of safe swallow strategies; small sips followed by throat clear and dry swallow. Pt has been scheduled for an esophagram today. Speech will review findings and follow up.    HPI HPI: 81 yo female adm to [facility] with complaint of dyphagia - diagnosed with ARF, Pt with h/o multinodular goiter, CHF, esophageal dysphagia, syncope, psh + for hip sx.  Pt has been at rehab since 2013 and reports her swallowing problems have been ongoing but have worsened predominantly in the last 2-3 weeks resulting in weight loss x 20-30 pounds in 3 months.  Esophagram 2015 showed dysmotility, silent + laryngeal penetration.  MBS in 2013 showed mild pharyngeal deficits.  Pt now reports significant pain with swallowing and sensation of stasis on left side of neck.  Swallow evaluation ordered.    Pertinent Vitals Pain Assessment: Faces Faces Pain Scale: Hurts whole lot Pain Descriptors / Indicators: Restless  SLP Plan  Continue with current plan of care    Recommendations Diet recommendations: Dysphagia 3 (mechanical soft);Thin  liquid Liquids provided via: Cup Medication Administration: Whole meds with puree Supervision: Patient able to self feed Compensations: Slow rate;Small sips/bites;Clear throat intermittently;Multiple dry swallows after each bite/sip Postural Changes and/or Swallow Maneuvers: Seated upright 90 degrees              Oral Care Recommendations: Oral care BID Follow up Recommendations: Skilled Nursing facility Plan: Continue with current plan of care    [place]     Sophia Oliver 2015-01-07, 12:28 PM

## 2015-01-03 NOTE — Progress Notes (Signed)
[facility] PROGRESS NOTE  Sophia Oliver WLN:[id number] DOB: 1924-01-16 DOA: 12/12/2014 PCP: Sophia Gravel, MD  Assessment/Plan: 1. Acute on chronic stage 3 CKD 1. Suspect related to poor po intake with concurrent diuretic use prior to admit 2. Pt is cont with gentle IVF 3. Would cont to monitor i/o and daily wts 4. Continuing to hold diuretics secondary to concerns of dehydration 5. Renal function initially improving with IVF, however worsened today 6. CXR without frank pulm edema 7. Pt feels thirsty and remains clinically dehydrated 8. Will increase fluids to 100cc/hr 2. PAF 1. Noted on presenting EKG 2. CHADS of at least 3. Appreciate input by Pharmacy. Pt had been on Ranexa but had been stopped secondary to falls. Pt was last seen by Cardiology on 11/22/14, no antiplatelet/anticoagulation was recommended or mentioned at that time 3. Currently rate controlled 4. Monitor for now 3. Combined chronic and diastolic chf 1. Clinically dehydrated 2. Continue gentle IVF per above, cautious to avoid acute CHF 3. Monitor i/o and daily wts 4. DM2 1. Cont on SSI coverage 2. Glucose remains stable 5. UTI 1. UA suggestive of UTI 2. Follow up on urine culture 3. Cont on empiric rocephin 4. No leukocytosis 6. Severe protein calorie malnutrition 1. Consulted nutrition 2. Appreciate recs 7. DVT prophylaxis 1. Heparin subQ 8. Multinodular goiter 1. Neck mass noted on exam 2. Follow up US of neck demonstrates multinodular goiter with none of the nodules meeting imaging criteria to recommend percutaneous sampling 3. TSH and free T4 normal 4. SLP consulted with recs for NPO given concerns for aspiration 5. CT neck w/o esophageal obstruction 9. Dysphagia 1. SLP following 2. Pt is s/p MBS with recs for dysphagia 3 with thin liquids 3. Pt still unable to tolerate any meaningful amounts of PO. Have discussed case with GI, who recommends esophogram. Esophogram pending 10. Weakness 1. Consulted  PT/OT 2. Therapy recs for SNF 11. Hypokalemia 1. K added to IVF 2. Likely secondary to malnourishment 12. SOB 1. Increased respiratory effort noted this AM 2. Normal O2 sats 3. Have checked d-dimer, which was elevated 4. Follow up VQ pending to r/o PE 5. Family reports current sob is c/w pt's known anxiety spells 6. Will add PRN ativan  Code Status: DNR Family Communication: Pt in room, family at bedside Disposition Plan: Pending   Consultants:  none  Procedures:    Antibiotics:  Rocephin 2/5>>> (indicate start date, and stop date if known)  HPI/Subjective: Continues to report difficulty swallowing. Feeling thirsty today. SOB noted overnight and again this AM.  Objective: Filed Vitals:   12/12/14 2357 10-Jan-2015 0204 2015/01/10 0428 2015/01/10 0439  BP: 127/69 96/66 103/77   Pulse: 109 99 111   Temp:  98.4 F (36.9 C) 98.5 F (36.9 C)   TempSrc:  Oral Oral   Resp: 30 21 24    Height:      Weight:    49.714 kg (109 lb 9.6 oz)  SpO2: 97% 100% 100%     Intake/Output Summary (Last 24 hours) at 01-10-2015 1508 Last data filed at 01-10-2015 1000  Gross per 24 hour  Intake   1550 ml  Output      0 ml  Net   1550 ml   Filed Weights   12/11/14 0630 12/12/14 0538 01/10/2015 0439  Weight: 44.8 kg (98 lb 12.3 oz) 44.7 kg (98 lb 8.7 oz) 49.714 kg (109 lb 9.6 oz)    Exam:   General:  Awake, sitting up in bed, in  nad  Cardiovascular: regular, s1, s2  Respiratory: mildly increased resp effort, no wheezing  Abdomen: soft,nondistended  Musculoskeletal: perfused, no clubbing   Data Reviewed: Basic Metabolic Panel:  Recent Labs Lab 12/17/2014 1322 12/10/14 0722 12/11/14 0535 12/11/14 0800 12/12/14 0520 January 07, 2015 0500  NA 139 140 145  --  142 143  K 3.7 3.5 2.9*  --  3.6 5.7*  CL 104 107 113*  --  116* 118*  CO2 22 20 19   --  18* 15*  GLUCOSE 122* 95 104*  --  112* 158*  BUN 72* 66* 48*  --  42* 44*  CREATININE 2.73* 2.32* 1.74*  --  1.81* 2.46*  CALCIUM 8.4 7.8*  8.0*  --  8.1* 8.4  MG  --   --   --  1.9  --   --    Liver Function Tests:  Recent Labs Lab 12/10/14 0722  AST 22  ALT 13  ALKPHOS 93  BILITOT 0.6  PROT 5.4*  ALBUMIN 3.0*   No results for input(s): LIPASE, AMYLASE in the last 168 hours. No results for input(s): AMMONIA in the last 168 hours. CBC:  Recent Labs Lab 12/11/2014 1322 12/10/14 0722  WBC 9.7 7.8  NEUTROABS 7.0  --   HGB 10.1* 9.9*  HCT 30.9* 31.1*  MCV 84.7 86.1  PLT 210 190   Cardiac Enzymes:  Recent Labs Lab 12/22/2014 1322 12/14/2014 1900 12/10/14 0056 12/10/14 0722  TROPONINI 0.06* 0.05* 0.06* 0.05*   BNP (last 3 results)  Recent Labs  12/18/2014 1322  BNP 1510.0*    ProBNP (last 3 results)  Recent Labs  06/24/14 1113 08/21/14 1655 08/21/14 2139  PROBNP 16090.0* 37601.0* 42122.0*    CBG:  Recent Labs Lab 12/11/14 1641 12/11/14 2015 12/11/14 2350 12/12/14 0416 07-Jan-2015 1253  GLUCAP 166* 143* 129* 100* 133*    Recent Results (from the past 240 hour(s))  MRSA PCR Screening     Status: Abnormal   Collection Time: 12/19/2014  5:15 PM  Result Value Ref Range Status   MRSA by PCR POSITIVE (A) NEGATIVE Final    Comment:        The GeneXpert MRSA Assay (FDA approved for NASAL specimens only), is one component of a comprehensive MRSA colonization surveillance program. It is not intended to diagnose MRSA infection nor to guide or monitor treatment for MRSA infections. RESULT CALLED TO, READ BACK BY AND VERIFIED WITH: CALLED TO RN DALLAS HART [phone] @2137  THANEY   Clostridium Difficile by PCR     Status: None   Collection Time: 12/11/14  8:48 PM  Result Value Ref Range Status   C difficile by pcr NEGATIVE NEGATIVE Final  Culture, Urine     Status: None (Preliminary result)   Collection Time: 12/12/14  4:11 AM  Result Value Ref Range Status   Specimen Description URINE, CLEAN CATCH  Final   Special Requests NONE  Final   Colony Count   Final    >=100,000 COLONIES/ML Performed  at [facility]    Culture   Final    ESCHERICHIA COLI Performed at [facility]    Report Status PENDING  Incomplete     Studies: Dg Chest Port 1 View  January 07, 2015   CLINICAL DATA:  81 year old female with CHF  EXAM: PORTABLE CHEST - 1 VIEW  COMPARISON:  Prior chest x-ray 12/15/2014  FINDINGS: Stable cardiomegaly. Atherosclerotic calcifications present in the transverse aorta. Interstitial edema has improved compared to prior. There are bilateral layering pleural  effusions. The left pleural effusion is similar to slightly decreased compared to prior while the right pleural effusion appears to up enlarged. Superimposed bibasilar opacities are favored to reflect atelectasis. Right subclavian approach cardiac rhythm maintenance device with leads projecting over the right atrium and right ventricle. No pneumothorax. Incompletely imaged right shoulder arthroplasty. Advanced degenerative osteoarthritis of the left shoulder.  IMPRESSION: 1. Improved interstitial pulmonary edema. 2. Slightly enlarged right pleural effusion with associated right basilar atelectasis. 3. Similar to incrementally improved left pleural effusion with associated left basilar atelectasis. 4. Stable cardiomegaly.   Electronically Signed   By: Jacqulynn Cadet M.D.   On: 12/21/14 08:37    Scheduled Meds: . antiseptic oral rinse  7 mL Mouth Rinse q12n4p  . chlorhexidine  15 mL Mouth Rinse BID  . Chlorhexidine Gluconate Cloth  6 each Topical Q0600  . heparin  5,000 Units Subcutaneous 3 times per day  . insulin aspart  0-15 Units Subcutaneous 6 times per day  . metoprolol  2.5 mg Intravenous 4 times per day  . mupirocin ointment  1 application Nasal BID  . sodium chloride  3 mL Intravenous Q12H   Continuous Infusions: . dextrose 5 % and 0.9 % NaCl with KCl 40 mEq/L 100 mL/hr at 2014/12/21 1008  . dextrose 5 % and 0.9% NaCl Stopped (12/11/14 2112)    Principal Problem:   ARF (acute renal failure) Active  Problems:   CKD (chronic kidney disease), stage III   Pacemaker   PAF (paroxysmal atrial fibrillation)   Malnutrition of moderate degree   Acute on chronic combined systolic and diastolic CHF (congestive heart failure)   DM (diabetes mellitus) type II controlled with renal manifestation   Paroxysmal atrial fibrillation   AKI (acute kidney injury)   Protein-calorie malnutrition, severe   Nelva Hauk K  [facility] Pager [phone]. If 7PM-7AM, please contact night-coverage at [email or web address], password [facility] 12/21/2014, 3:08 PM  LOS: 4 days

## 2015-01-03 NOTE — Progress Notes (Signed)
No esophageal obstruction. This is oropharyngeal dysphagia. We will sign off.

## 2015-01-03 NOTE — Discharge Summary (Signed)
Death Summary  Sophia Oliver:[id number] DOB: 04-23-1924 DOA: 2014/12/29  PCP: [name], MD  Admit date: 12-29-2014 Date of Death: 02-Jan-2015  Final Diagnoses:  Principal Problem:   ARF (acute renal failure) Active Problems:   CKD (chronic kidney disease), stage III   Pacemaker   PAF (paroxysmal atrial fibrillation)   Malnutrition of moderate degree   Acute on chronic combined systolic and diastolic CHF (congestive heart failure)   DM (diabetes mellitus) type II controlled with renal manifestation   Paroxysmal atrial fibrillation   AKI (acute kidney injury)   Protein-calorie malnutrition, severe   History of present illness:  Please review the dictated h and p from 2/5 for details. Briefly, pt presented initially with complaints of dysphagia and subjective sob. The patient was found to be in acute renal failure and clinically dehydrated. She was admitted for further work up.  Hospital Course:  1.  Acute on chronic stage 3 CKD 1. Suspected related to poor po intake with concurrent diuretic use prior to admit 2. Pt was cont with gentle IVF and held diuretics secondary to concerns of dehydration 3. Renal function initially improving with IVF, however became slightly worse on Jan 02, 2023 4. CXR was without frank pulm edema 5. Pt reported feeling thirsty and remained clinically dehydrated with fluids continued 2. PAF 1. Noted on presenting EKG 2. CHADS of at least 3. Appreciate input by Pharmacy. Pt had been on Ranexa but had been stopped secondary to falls. Pt was last seen by Cardiology on 11/22/14, no antiplatelet/anticoagulation was recommended or mentioned at that time 3. Patient had remained rate controlled and continued on cardiac monitor 3. Combined chronic and diastolic chf 1. Clinically dehydrated 2. Continue gentle IVF per above 4. DM2 1. Patient was cont on SSI coverage 2. Glucose remained stable 5. UTI 1. UA was suggestive of UTI 2. Patient was continued on empiric  rocephin 3. Urine culture later positive for ecoli 4. No leukocytosis noted 6. Severe protein calorie malnutrition 1. Consulted nutrition 7. DVT prophylaxis 1. Heparin subQ while inpatient 8. Multinodular goiter 1. Neck mass was noted on exam 2. Follow up US of neck demonstrated multinodular goiter with none of the nodules meeting imaging criteria to recommend percutaneous sampling 3. TSH and free T4 normal 4. SLP consulted with recs for dysphagia diet given concerns for aspiration 5. CT neck w/o esophageal obstruction 9. Dysphagia 1. SLP was following 2. Pt was s/p MBS with recs for dysphagia 3 with thin liquids 3. Pt was still unable to tolerate any meaningful amounts of PO. Had discussed case with GI, who recommended esophogram. Findings of oropharyngeal dysphagia were noted and GI had signed off. 10. Weakness 1. Had consulted PT/OT 2. Therapy recs for were for SNF 11. Hypokalemia 1. Likely secondary to malnourishment 12. SOB 1. Increased respiratory effort was noted 2. Normal O2 sats 3. Had checked d-dimer, which was elevated 4. Follow up VQ was ordered to r/o PE 5. Family reported current sob is c/w pt's known anxiety spells 6. Added PRN ativan with improvement   Time of death 1744-02-22  Signed:  Felicha Frayne K  [facility] 02-Jan-2015, 6:20 PM

## 2015-01-03 NOTE — Progress Notes (Signed)
Patient was in the room eating dinner,alert and oriented, talking to the RN and Nurse tech. Upon RN and tech leaving the room ; patient still feeding self. Central cardiac monitor called to informed staff to check on patient  And patient was found to be slumped over in bed, pulseless, and lips were blue. Protocol assessment was performed. MD notified of patient's status. MD and charged nurse at bedside. Patient's next of kin notified. Tomma Rakers RN

## 2015-01-03 NOTE — Progress Notes (Signed)
RN called to room as patient anxious, stating "I can't breathe, don't leave me alone, I can't breathe".  Patient's saturations 97% on 2L via nasal cannula, placed for comfort.  Patient's respirations 20s-30/min, patient stating, "I can't breathe, I can't swallow".  Patient repositioned in bed with 2 assist, assisted to sitting position.  Patient had incontinent void at this time, bed linens changed and patient provided with peri-care.  Patient placed on venturi mask for comfort, oxygen saturations 100%.  Patient given scheduled metoprolol IV for HR 110s-130.  Patient willing to try anxiety medication, provider on call notified for order.  0.25mg  ativan administered by mouth.  Patient repeatedly stating, "don't leave me, I can't breathe".  Placed nasal cannula on patient instead of venturi mask at patient's request.  Asked patient if she would like to call her son or husband to spend the night with her but she refused.  RN sat in room with patient for approximately 20 minutes until patient fell asleep.  Will continue to monitor.

## 2015-01-03 NOTE — Progress Notes (Signed)
Patient discussed with MD- not stable for d/c at this time.  Resident of [facility] and active bed search is currently in place as patient would like to seek another facility if possible. Patient has been a resident of [place] in the past. CSW spoke to Jolyne Loa, Engineer, site of [employer].  She will check on past records and determine if she can offer a bed to facility. Other bed offers in place as well.  Lorie Phenix. Pauline Good, [place]

## 2015-01-03 NOTE — Progress Notes (Signed)
Patient ID: Sophia Oliver, female   DOB: 08-Nov-1923, 81 y.o.   MRN: [id number]  [name] living [place]     Allergies  Allergen Reactions  . Codeine Nausea And Vomiting  . Other Nausea And Vomiting and Other (See Comments)    Narcotics causes dizziness  . Hydrocodone-Acetaminophen Itching       Chief Complaint  Patient presents with  . Medical Management of Chronic Issues    HPI:  She is a resident of this facility being seen for the management of her chronic illnesses. Her status is fragile. She is awaiting a nephrologist appointment and awaiting a renal ultrasound due to her renal failure. She states that she feeling "ok"; but has difficulty understanding why she is not feeling any better. We did discuss her overall condition and expectations.    Past Medical History  Diagnosis Date  . Syncope   . Sinus node dysfunction     a. s/p pacemaker.  . Wide-complex tachycardia   . Cholelithiasis   . Glucose intolerance (impaired glucose tolerance)   . Anxiety   . PONV (postoperative nausea and vomiting)   . Chronic kidney disease     a. Baseline Cr reported 1.8-2.0.  . Injury of right upper arm     after venipuncture in 3/13 - pt describes pain afterwards- no followup done seen by PCP- no further treatment   . PAF (paroxysmal atrial fibrillation)   . Left bundle branch block   . Tachycardia-bradycardia syndrome   . CHF (congestive heart failure)     a. EF 30-35% by echo 2015.  [name] Pacemaker     Guidant, implanted 2006  . Type II diabetes mellitus     "recently dx'd" (03/09/2014)  . Breast cancer 2003    "radiation on right; mastectomy on the left"  . Hypertension   . GERD (gastroesophageal reflux disease)   . DJD (degenerative joint disease)   . Arthritis     "real bad in my lower back" (03/09/2014)  . Chronic lower back pain   . Orthostatic hypotension     a. Diuretics d/c'd 03/2014.  [name] PAT (paroxysmal atrial tachycardia)     Past Surgical History  Procedure  Laterality Date  . Replacement total knee bilateral Bilateral   . Total shoulder replacement    . Tonsillectomy    . Orif distal femur fracture Right 12/2007  . Tonsillectomy    . Mastectomy Left 2003  . Total hip arthroplasty  06/16/2012    Procedure: TOTAL HIP ARTHROPLASTY;  Surgeon: Mauri Pole, MD;  Location: [facility];  Service: Orthopedics;  Laterality: Left;  . Hip closed reduction  06/30/2012    Procedure: CLOSED MANIPULATION HIP;  Surgeon: Mauri Pole, MD;  Location: [facility];  Service: Orthopedics;  Laterality: Left;  . Abdominal hysterectomy    . Pacemaker insertion      Guidant Insignia I  . Cataract extraction w/ intraocular lens  implant, bilateral Bilateral 2000's  . Refractive surgery Bilateral 01/2014  . Inguinal hernia repair Left     "had emergency OR for it a few years ago" (03/09/2014)  . Joint replacement    . Breast biopsy Bilateral     VITAL SIGNS BP 132/76 mmHg  Pulse 70  Ht 5\' 4"  (1.626 m)  Wt 109 lb (49.442 kg)  BMI 18.70 kg/m2  SpO2 94%   Outpatient Encounter Prescriptions as of 11/17/2014  Medication Sig   . acetaminophen (TYLENOL) 500 MG tablet Take 500 mg by mouth every  6 (six) hours as needed (joint pain).  [name] ALPRAZolam (XANAX) 0.25 MG tablet Take one tablet by mouth twice daily for anxiety  . Alum & Mag Hydroxide-Simeth (MAGIC MOUTHWASH) SOLN Take 10 mLs by mouth 3 (three) times daily as needed for mouth pain (mouth pain).  [name] diclofenac sodium (VOLTAREN) 1 % GEL Apply 2 g topically 4 (four) times daily as needed (joint pain).  [name] docusate sodium 100 MG CAPS Take 100 mg by mouth 2 (two) times daily.  . feeding supplement, GLUCERNA SHAKE, (GLUCERNA SHAKE) LIQD Take 237 mLs by mouth 3 (three) times daily between meals.  . metoprolol (LOPRESSOR) 50 MG tablet Take 1 tablet (50 mg total) by mouth 2 (two) times daily.  . pantoprazole (PROTONIX) 40 MG tablet Take 40 mg by mouth daily.    SIGNIFICANT DIAGNOSTIC EXAMS  08-22-14: 2-d echo:  Left  ventricle: The cavity size was normal. Systolic function was moderately to severely reduced. The estimated ejection fraction was in the range of 30% to 35%. Regional wall motion abnormalities cannot be excluded. Doppler parameters are consistent with abnormal left ventricular relaxation (grade 1 diastolic dysfunction). - Ventricular septum: Septal motion showed paradox. - Aortic valve: There was trivial regurgitation. - Mitral valve: Calcified annulus. There was moderate regurgitation. - Right atrium: The atrium was mildly dilated. - Atrial septum: No defect or patent foramen ovale was identified. - Tricuspid valve: There was severe regurgitation. - Pulmonary arteries: Systolic pressure was severely increased.    10-07-14: chest x-ray: no acute cardiopulmonary process; chronic interstitial lung changes.    LABS REVIEWED:   09-01-14: glucose 141; bun 41; creat 1.79; k+5.6; na++135 09-05-14: glucose 81; bun 51; creat 1.87; k+4.7; na++135 09-12-14: glucose 76; bun 65; creat 2.41; k+5.3; na++136 10-07-14: wbc 8.8; hgb 11.2; hct 34.4; mcv 83.1; plt 359; glucose 153; bun 90; creat 2.89; k+6.8; na++136 10-09-14: ;urine culture: klebsiella pneumonia: septra; k+ 5.5 10-11-14:glucose 85; bun 64; creat 2.47; k+6.1; na++134  10-17-14: glucose 91; bun 109; creat 2.89; k+5.0; na++131 10-20-14: glucose 107; bun 75; creat 2.06; k+4.9; na++136   10-26-14: glucose 72; bun 42; creat 1.3; k+4.8; na++139       ROS Constitutional: Negative for malaise/fatigue.  Respiratory: Negative for cough and shortness of breath.   Cardiovascular: Negative for chest pain, palpitations and leg swelling.  Gastrointestinal: Negative for heartburn, abdominal pain and constipation.  Musculoskeletal: Negative for myalgias and joint pain.  Skin: Negative.   Psychiatric/Behavioral: Negative for depression. The patient is nervous/anxious    Physical Exam Constitutional: No distress.  cathectic   Neck: Neck supple. No JVD  present.  Cardiovascular: Normal rate, regular rhythm and intact distal pulses.   Respiratory: Effort normal and breath sounds normal. No respiratory distress.  GI: Soft. She exhibits no distension.  Musculoskeletal: She exhibits no edema.  Is able to move all extremities   Neurological: She is alert.  Skin: Skin is warm and dry. She is not diaphoretic.  Psychiatric: not anxious      ASSESSMENT/ PLAN:  1. CKD stage III: her status remains without change her creat is 1.3; will not make changes will continue to monitor her status; awaiting nephrology consult.   2. Afib: her heart rate is stable will continue lopressor 50 mg twice daily is status post pacemaker insertion.  3. Osteoarthritis: has in multiple joints: is presently stable will continue voltaren gel 2 gm four times daily to shoulders and lower back. Will monitor   4. Chronic combined systolic and diastolic heart failure: is presently  without change in her status; she is presently off diuretic due to her renal function; will utilize these medications as indicated based upon her symptoms will monitor her status and will not make changes at this time.   5. respiratory failure with hypoxia: she is 02 dependent;is taking mucinex twice daily will monitor  6. Gerd: will continue protonix 40 mg daily   7. Constipation: will continue colace twice daily   8. Anxiety: is emotionally stable at this time will continue xanax 0.25 mg twice daily   9. FTT: is on remeron 7.5 mg nightly to help stimulate appetite; will continue supplements per facility protocol and will monitor her status.    Time spent with patient 45 minutes.    Ok Edwards NP [facility]  Contact [phone] Monday through Friday 8am- 5pm  After hours call [phone]

## 2015-01-03 DEATH — deceased

## 2015-03-10 IMAGING — CR DG CHEST 2V
2 series · 2 of 2 positions shown · non-contrast
Comparison: Prior radiograph from 11/19/2012

CLINICAL DATA: Weakness

EXAM:
CHEST  2 VIEW

[w chest pa]
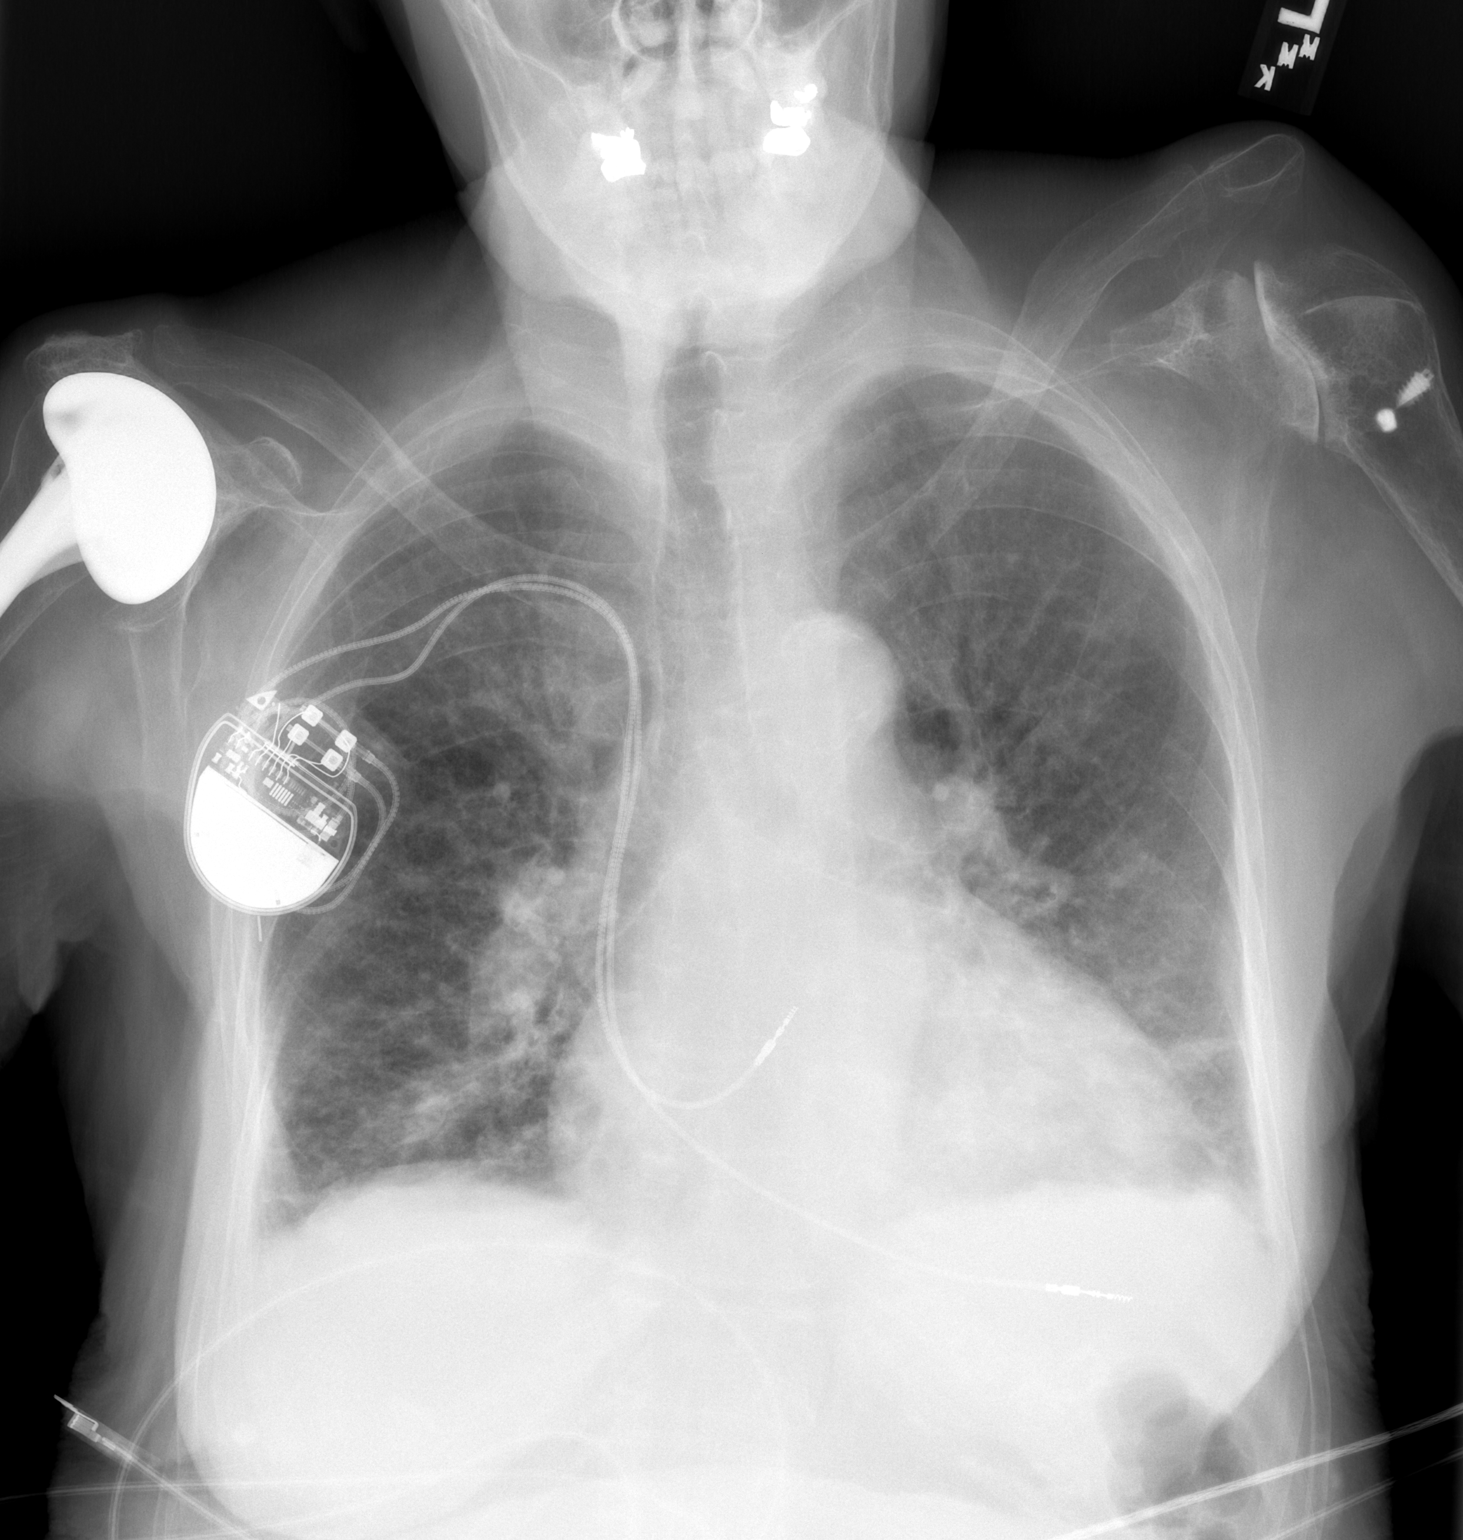

[w chest lat]
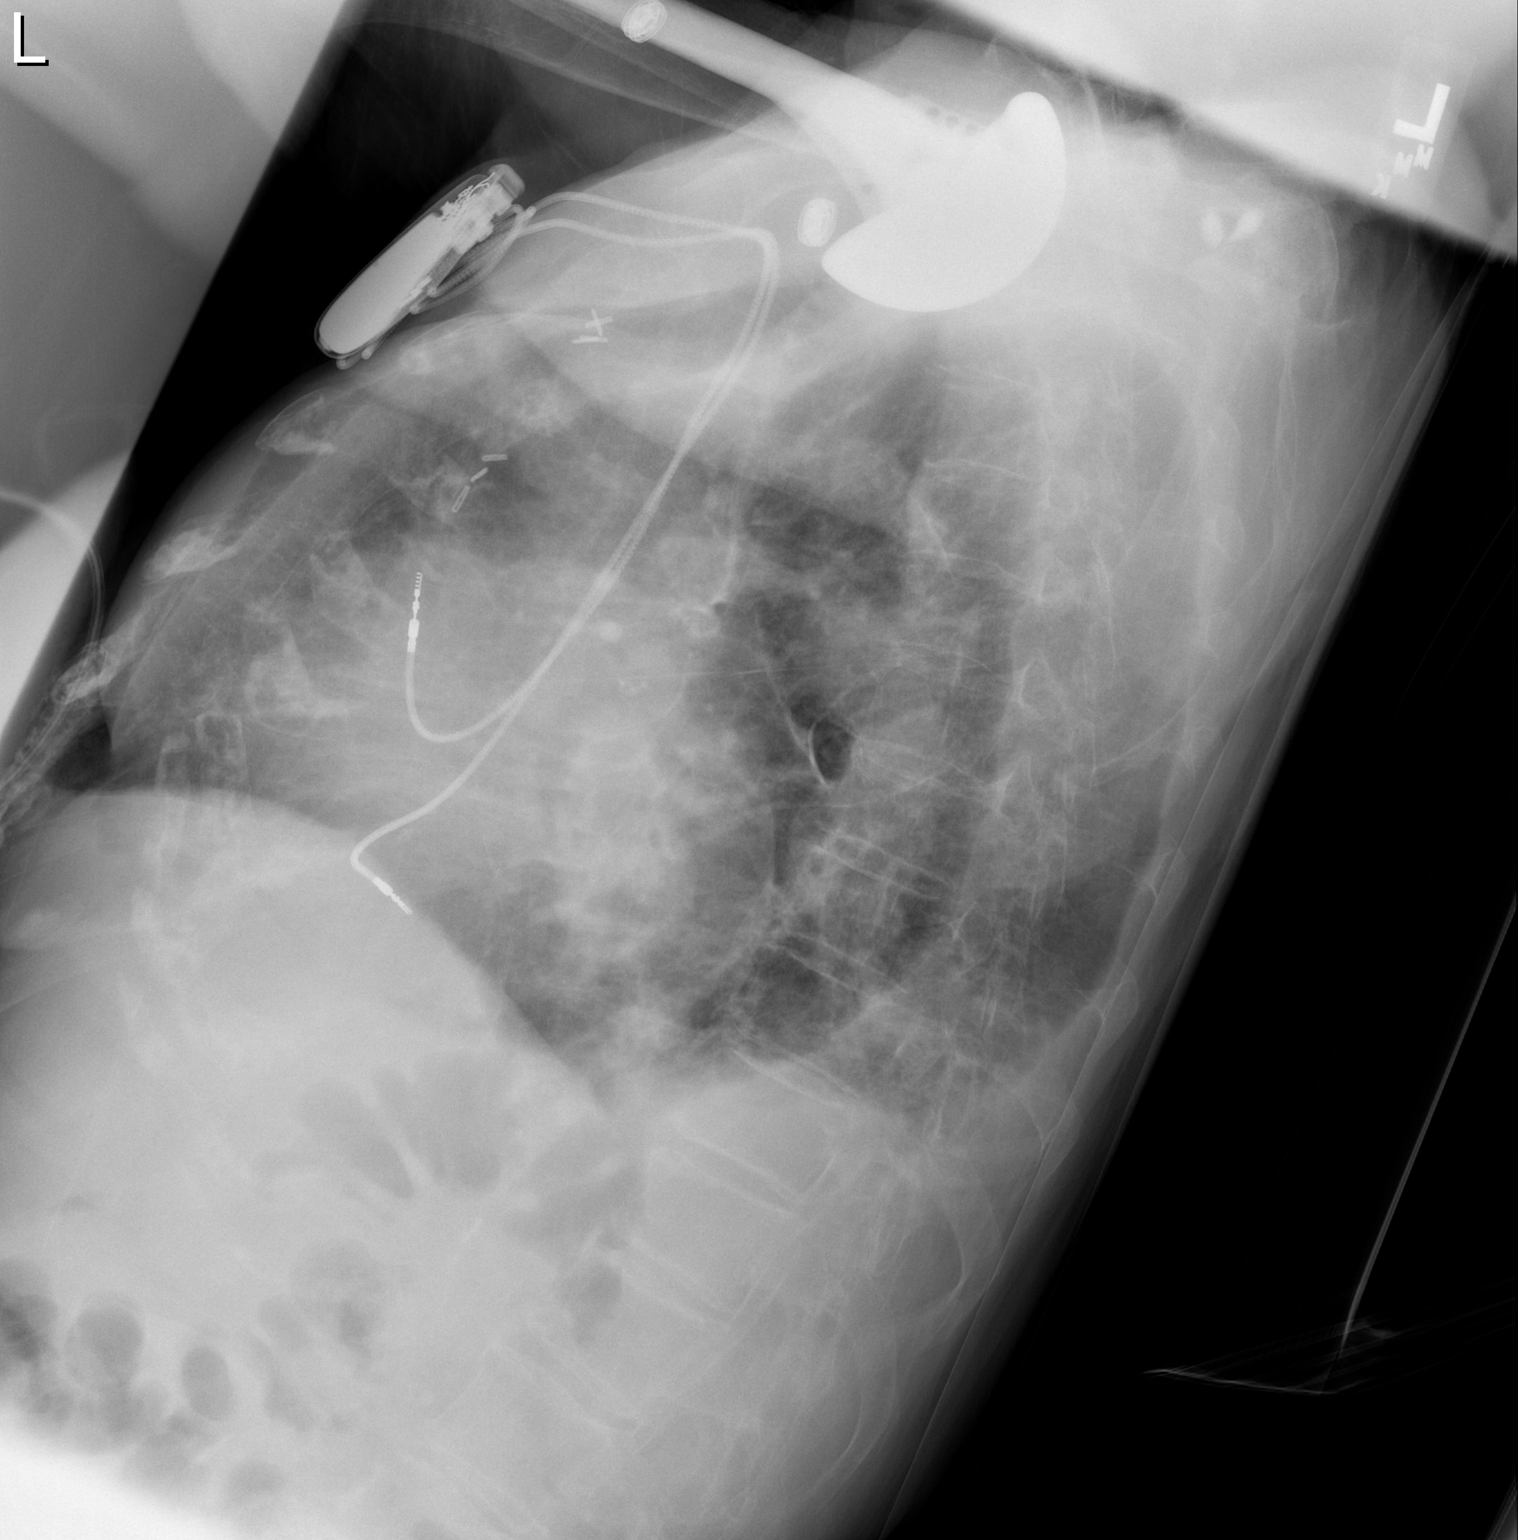

[2 of 2 positions shown; findings below may reference images not displayed]

FINDINGS: Right-sided pacemaker/AICD with electrodes overlying the right
ventricle and right atrium are unchanged. Cardiomegaly is grossly
stable.

There is diffuse pulmonary vascular congestion with indistinctness
of the interstitial markings, suggesting mild pulmonary edema. A few
scattered Kerley B-lines are noted. Bibasilar atelectasis is present
as well. Small bilateral pleural effusions noted. No definite focal
infiltrate. There is no pneumothorax.

Right shoulder arthroplasty partially visualized. Severe
degenerative changes noted at the left shoulder. Diffuse osteopenia
present. No acute osseus abnormality.
IMPRESSION: 1. Cardiomegaly with findings suggestive of mild diffuse pulmonary
edema.
2. Small bilateral pleural effusions.
3. Mild bibasilar atelectasis.

## 2015-08-19 IMAGING — CR DG CHEST 2V
2 series · 2 of 2 positions shown · non-contrast
Comparison: August 21, 2014.

CLINICAL DATA: Cough, congestive heart failure.

EXAM:
CHEST  2 VIEW

[w chest lat]
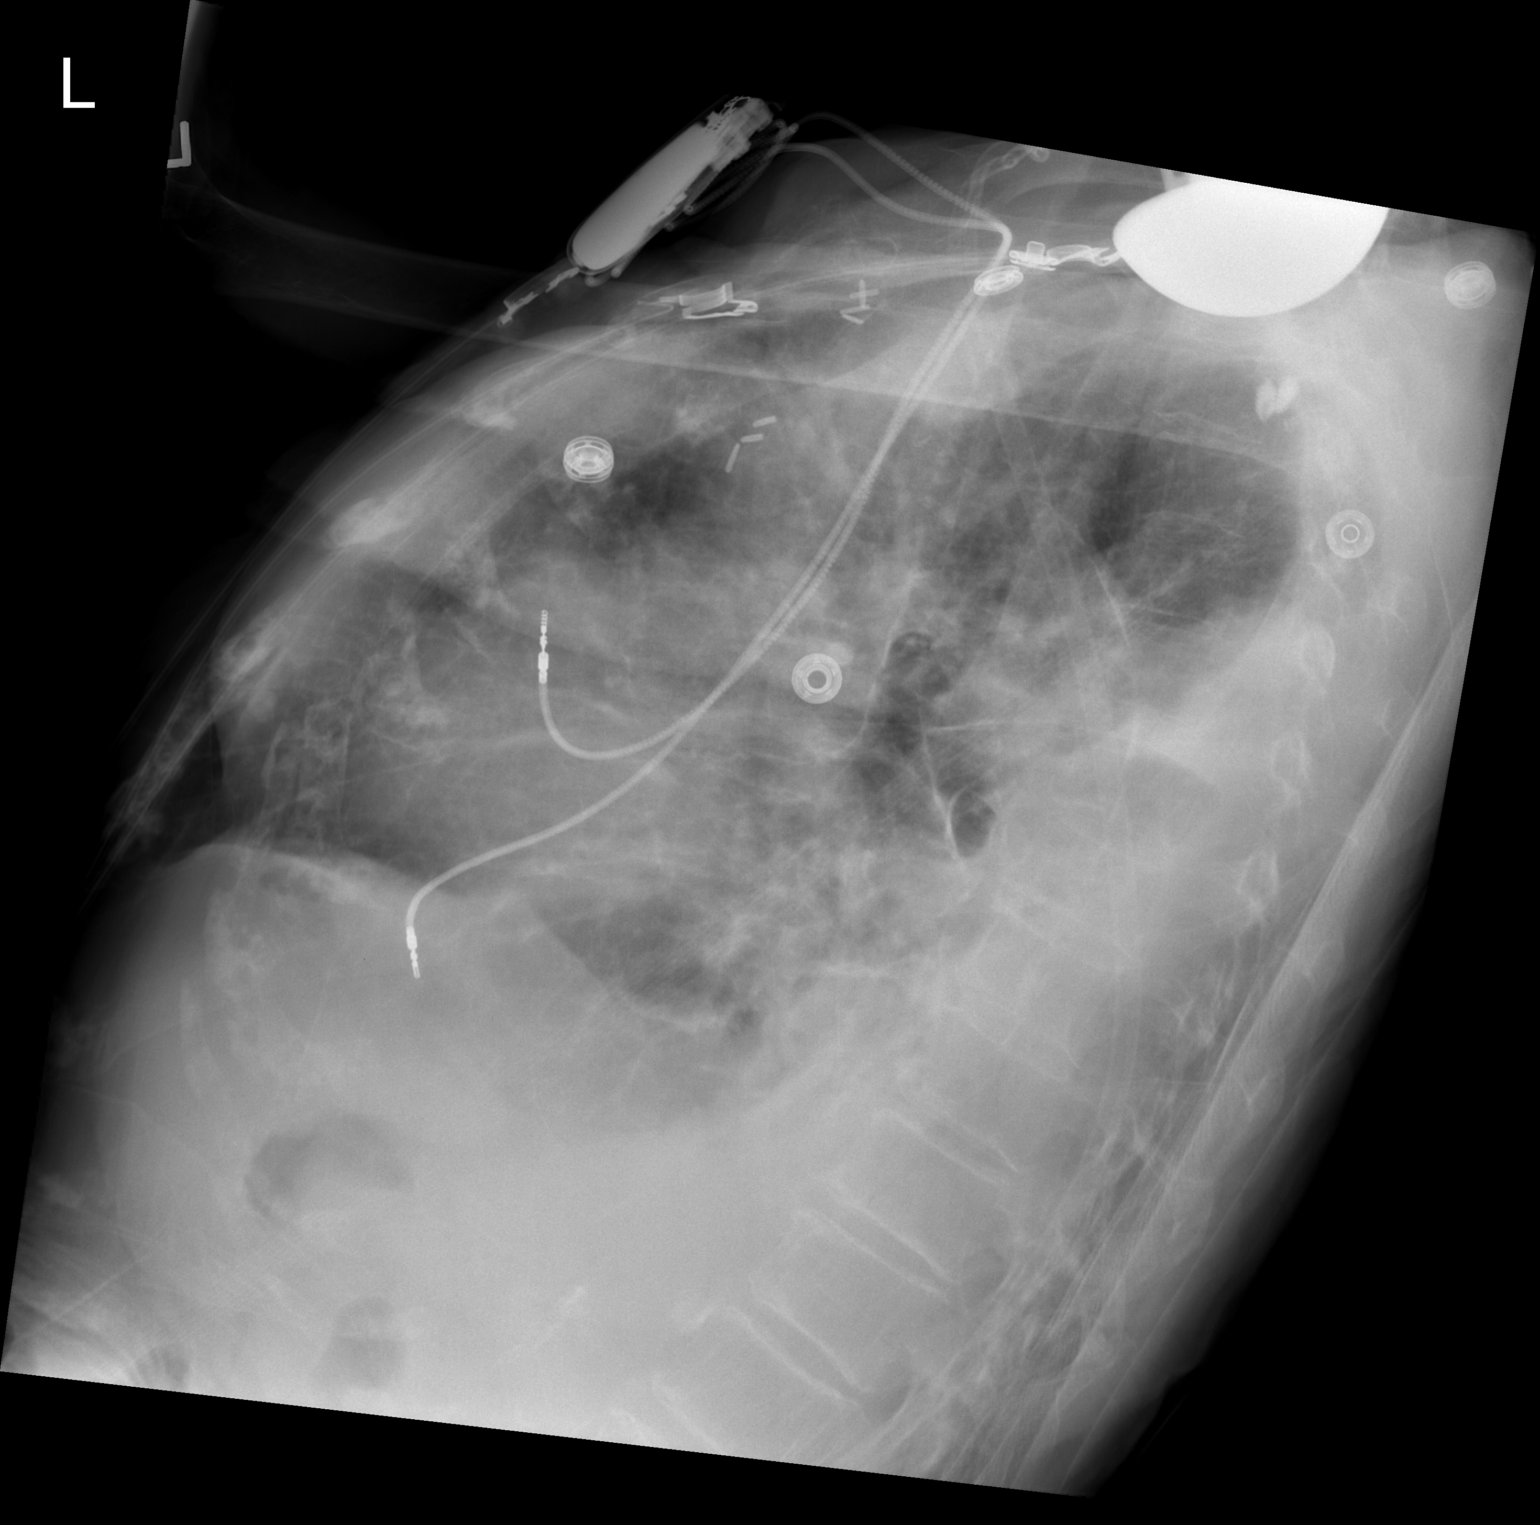

[view not recorded]
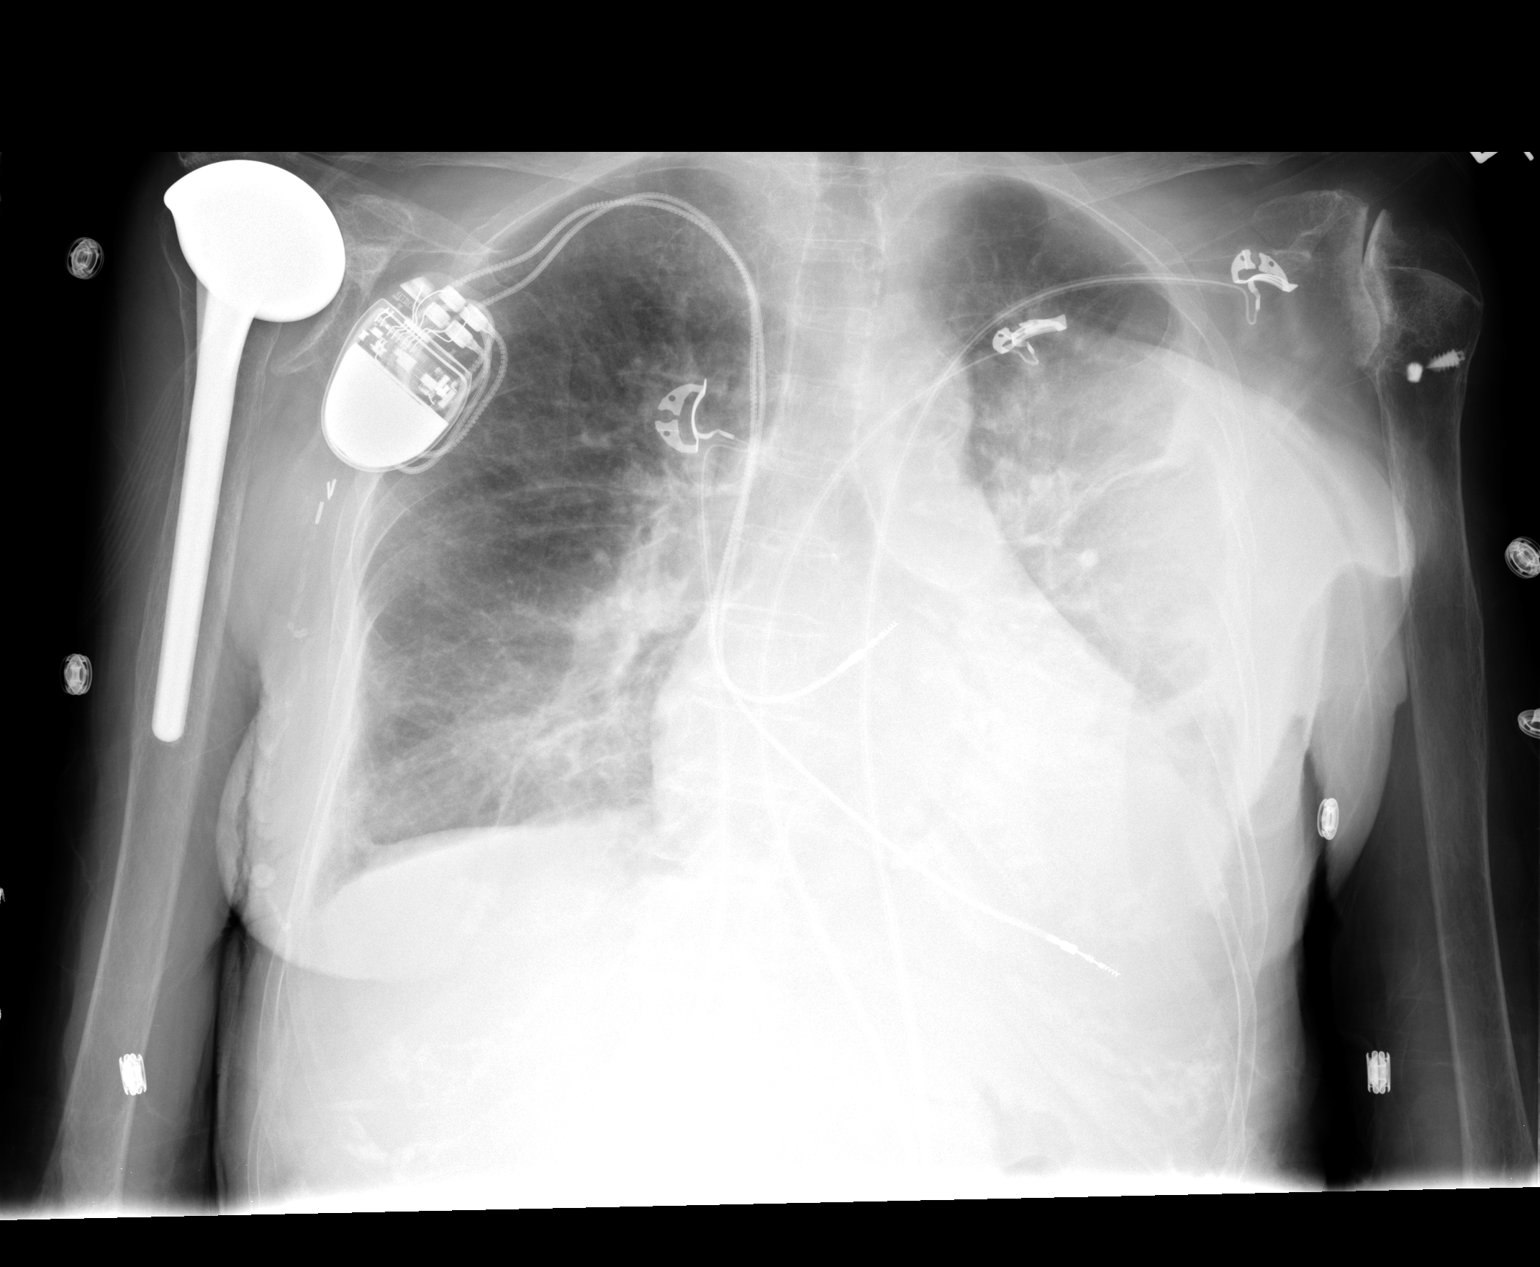

[2 of 2 positions shown; findings below may reference images not displayed]

FINDINGS: Stable cardiomegaly. Status post right shoulder arthroplasty.
Right-sided defibrillator is unchanged in position. Severe
degenerative joint disease of left glenohumeral joint is again
noted. No pneumothorax is noted. Stable bilateral pleural effusions
are noted with left greater than right. Stable central pulmonary
vascular congestion is noted. Stable right basilar opacity is noted
concerning for possible pneumonia. Left basilar opacity is again
noted concerning for pneumonia or atelectasis.
IMPRESSION: Stable bilateral pleural effusions with left greater than right.
Stable right basilar opacity is noted concerning for possible
pneumonia. Stable left basilar opacity is noted concerning for
pneumonia or atelectasis.

## 2015-12-08 IMAGING — CR DG CHEST 1V PORT
1 series · 1 of 1 positions shown · non-contrast
Comparison: Prior chest x-ray 12/09/2014

CLINICAL DATA: 89-year-old female with CHF

EXAM:
PORTABLE CHEST - 1 VIEW

[AP]
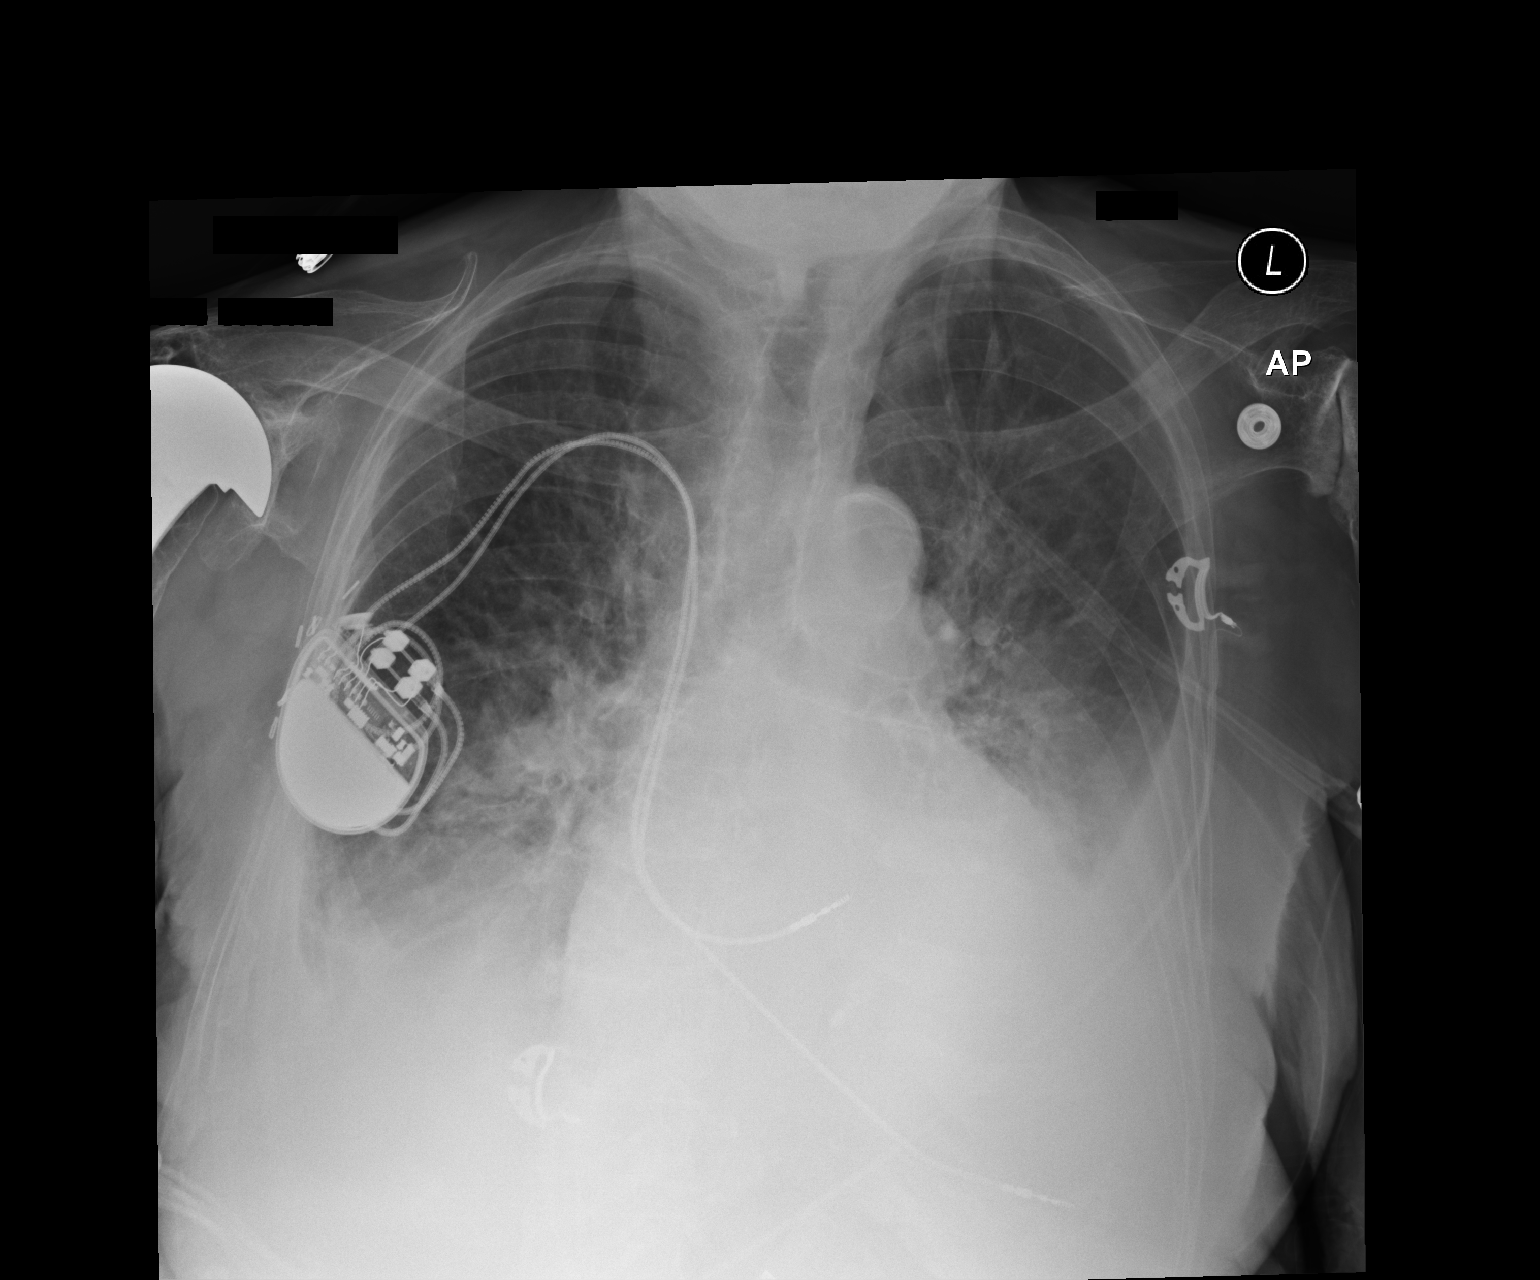

[1 of 1 positions shown; findings below may reference images not displayed]

FINDINGS: Stable cardiomegaly. Atherosclerotic calcifications present in the
transverse aorta. Interstitial edema has improved compared to prior.
There are bilateral layering pleural effusions. The left pleural
effusion is similar to slightly decreased compared to prior while
the right pleural effusion appears to up enlarged. Superimposed
bibasilar opacities are favored to reflect atelectasis. Right
subclavian approach cardiac rhythm maintenance device with leads
projecting over the right atrium and right ventricle. No
pneumothorax. Incompletely imaged right shoulder arthroplasty.
Advanced degenerative osteoarthritis of the left shoulder.
IMPRESSION: 1. Improved interstitial pulmonary edema.
2. Slightly enlarged right pleural effusion with associated right
basilar atelectasis.
3. Similar to incrementally improved left pleural effusion with
associated left basilar atelectasis.
4. Stable cardiomegaly.
